# Patient Record
Sex: Female | Born: 1955 | Race: Black or African American | Hispanic: No | Marital: Single | Smoking: Current every day smoker
Health system: Western US, Academic
[De-identification: ages and names within clinical notes are randomized; demographics above are authoritative.]

## PROBLEM LIST (undated history)

## (undated) ENCOUNTER — Emergency Department (HOSPITAL_COMMUNITY): Admission: EM | Payer: Self-pay

## (undated) DIAGNOSIS — F1510 Other stimulant abuse, uncomplicated: Secondary | ICD-10-CM

## (undated) DIAGNOSIS — M130 Polyarthritis, unspecified: Secondary | ICD-10-CM

## (undated) DIAGNOSIS — Z9884 Bariatric surgery status: Secondary | ICD-10-CM

## (undated) DIAGNOSIS — E785 Hyperlipidemia, unspecified: Secondary | ICD-10-CM

## (undated) DIAGNOSIS — F209 Schizophrenia, unspecified: Secondary | ICD-10-CM

## (undated) DIAGNOSIS — F319 Bipolar disorder, unspecified: Secondary | ICD-10-CM

## (undated) DIAGNOSIS — I10 Essential (primary) hypertension: Secondary | ICD-10-CM

## (undated) DIAGNOSIS — I719 Aortic aneurysm of unspecified site, without rupture: Secondary | ICD-10-CM

## (undated) DIAGNOSIS — S72142A Displaced intertrochanteric fracture of left femur, initial encounter for closed fracture: Secondary | ICD-10-CM

## (undated) DIAGNOSIS — M797 Fibromyalgia: Secondary | ICD-10-CM

## (undated) DIAGNOSIS — E119 Type 2 diabetes mellitus without complications: Secondary | ICD-10-CM

## (undated) DIAGNOSIS — F1110 Opioid abuse, uncomplicated: Secondary | ICD-10-CM

## (undated) DIAGNOSIS — J449 Chronic obstructive pulmonary disease, unspecified: Secondary | ICD-10-CM

## (undated) DIAGNOSIS — E669 Obesity, unspecified: Secondary | ICD-10-CM

## (undated) DIAGNOSIS — Z90710 Acquired absence of both cervix and uterus: Secondary | ICD-10-CM

## (undated) HISTORY — PX: STOMACH SURGERY: SHX791

## (undated) HISTORY — PX: GASTRIC BYPASS: SHX52

## (undated) HISTORY — DX: Polyarthritis, unspecified: M13.0

## (undated) HISTORY — DX: Fibromyalgia: M79.7

## (undated) HISTORY — PX: OTHER PROCEDURE: U1053

## (undated) MED ORDER — LYRICA 200 MG OR CAPS
200.00 mg | ORAL_CAPSULE | Freq: Three times a day (TID) | ORAL | Status: AC
Start: 2019-10-18 — End: ?

## (undated) MED ORDER — LYRICA 200 MG OR CAPS
ORAL_CAPSULE | ORAL | 1 refills | Status: AC
Start: 2018-06-28 — End: ?

## (undated) MED ORDER — METHOCARBAMOL 750 MG OR TABS
750.00 mg | ORAL_TABLET | Freq: Four times a day (QID) | ORAL | 0 refills | Status: AC | PRN
Start: 2019-09-02 — End: 2019-10-02

## (undated) MED ORDER — CLINDAMYCIN HCL 300 MG OR CAPS
300.00 mg | ORAL_CAPSULE | Freq: Three times a day (TID) | ORAL | 0 refills | Status: AC
Start: 2019-10-19 — End: ?

## (undated) MED ORDER — LYRICA 200 MG OR CAPS
200.00 mg | ORAL_CAPSULE | Freq: Two times a day (BID) | ORAL | 1 refills | Status: AC
Start: 2019-10-17 — End: 2020-01-15

## (undated) MED ORDER — LYRICA 200 MG OR CAPS
ORAL_CAPSULE | ORAL | 1 refills | Status: AC
Start: 2018-03-31 — End: ?

---
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
## 2006-12-14 ENCOUNTER — Emergency Department (HOSPITAL_BASED_OUTPATIENT_CLINIC_OR_DEPARTMENT_OTHER): Admitting: Emergency Medicine

## 2006-12-15 ENCOUNTER — Other Ambulatory Visit (INDEPENDENT_AMBULATORY_CARE_PROVIDER_SITE_OTHER): Payer: Self-pay | Admitting: Emergency Medicine

## 2006-12-15 ENCOUNTER — Emergency Department: Payer: Self-pay

## 2006-12-16 LAB — ECG, COMPLETE (HC/~~LOC~~/ENCINITAS)
PR INTERVAL: 172 ms
QRS INTERVAL/DURATION: 94 ms
QT: 432 ms
QTc (Bazett): 428 ms
R AXIS: 36 degrees
VENTRICULAR RATE: 59 {beats}/min

## 2006-12-17 ENCOUNTER — Emergency Department: Payer: Self-pay

## 2006-12-17 ENCOUNTER — Other Ambulatory Visit (HOSPITAL_BASED_OUTPATIENT_CLINIC_OR_DEPARTMENT_OTHER): Payer: Self-pay | Admitting: Emergency Medicine

## 2006-12-17 LAB — URINALYSIS
Glucose: NEGATIVE
Ketones: NEGATIVE
Nitrite: NEGATIVE
Specific Gravity: 1.014 (ref 1.002–1.030)
pH: 6.5 (ref 5.0–8.0)

## 2006-12-18 ENCOUNTER — Inpatient Hospital Stay (HOSPITAL_COMMUNITY): Admitting: Surgery

## 2006-12-18 ENCOUNTER — Inpatient Hospital Stay (HOSPITAL_BASED_OUTPATIENT_CLINIC_OR_DEPARTMENT_OTHER): Payer: Self-pay | Admitting: Surgery

## 2006-12-18 ENCOUNTER — Other Ambulatory Visit (HOSPITAL_BASED_OUTPATIENT_CLINIC_OR_DEPARTMENT_OTHER): Payer: Self-pay | Admitting: Surgery

## 2006-12-22 NOTE — Progress Notes (Signed)
Dictating Practitioner: Fanny Bien, M.D.        Date of Note: 12/18/2006    The patient is afebrile. Vital signs are stable. Her abdominal pain is  minimal and chronic in nature. CT scan did not reveal any acute findings.  We will give her a diet and she will be discharged home today with follow  up with GI Medicine for chronic abdominal pain.        Signature Derived From Controlled Access Password  Fanny Bien, M.D. 12/22/2006 07:06 A      DD: 12/18/2006 DT: 12/21/2006 02:30 P DocNo.: [id number]  VB/lds    cc:

## 2007-01-06 ENCOUNTER — Emergency Department (HOSPITAL_BASED_OUTPATIENT_CLINIC_OR_DEPARTMENT_OTHER): Admitting: Emergency Medicine

## 2007-01-06 ENCOUNTER — Other Ambulatory Visit (INDEPENDENT_AMBULATORY_CARE_PROVIDER_SITE_OTHER): Payer: Self-pay | Admitting: Emergency Medicine

## 2007-01-06 ENCOUNTER — Emergency Department: Payer: Self-pay

## 2007-01-07 ENCOUNTER — Other Ambulatory Visit (HOSPITAL_BASED_OUTPATIENT_CLINIC_OR_DEPARTMENT_OTHER): Payer: Self-pay | Admitting: Emergency Medicine

## 2007-01-07 LAB — URINALYSIS
Glucose: NEGATIVE
Ketones: NEGATIVE
Nitrite: NEGATIVE
Specific Gravity: 1.021 (ref 1.002–1.030)
pH: 6 (ref 5.0–8.0)

## 2007-02-02 ENCOUNTER — Emergency Department: Payer: Self-pay

## 2007-02-03 ENCOUNTER — Other Ambulatory Visit (INDEPENDENT_AMBULATORY_CARE_PROVIDER_SITE_OTHER): Payer: Self-pay | Admitting: Emergency Medicine

## 2007-02-03 ENCOUNTER — Inpatient Hospital Stay (HOSPITAL_COMMUNITY): Admitting: Internal Medicine

## 2007-02-03 LAB — LIVER PANEL, BLOOD
ALT (SGPT): INVALID IU/L (ref 10–45)
ALT (SGPT): 52 IU/L — ABNORMAL HIGH (ref 10–45)
AST (SGOT): INVALID IU/L (ref 10–45)
AST (SGOT): 47 IU/L — ABNORMAL HIGH (ref 10–45)
Albumin: INVALID gm/dL (ref 3.3–5.0)
Albumin: 3.9 gm/dL (ref 3.3–5.0)
Alkaline Phos: INVALID IU/L (ref 30–130)
Alkaline Phos: 87 IU/L (ref 30–130)
Bilirubin, Dir: INVALID mg/dL (ref <0.2)
Bilirubin, Dir: 0.2 mg/dL (ref <0.2)
Bilirubin, Tot: INVALID mg/dL (ref <1.2)
Bilirubin, Tot: 0.3 mg/dL (ref <1.2)
Total Protein: INVALID gm/dL (ref 6.0–8.0)
Total Protein: 6.6 gm/dL (ref 6.0–8.0)

## 2007-02-03 LAB — CBC WITH DIFF, BLOOD
Basophils: 1 % (ref 0–2)
Eosinophils: 3 % (ref 1–3)
Hct: 39.5 % (ref 36.0–46.0)
Hgb: 13.2 gm/dL (ref 12.0–16.0)
Lymphocytes: 25 % (ref 20–40)
MCH: 29.2 pg (ref 27–31)
MCHC: 33.4 % (ref 32–37)
MCV: 87.4 um3 (ref 82.0–98.0)
MPV: 6.2 fl — ABNORMAL LOW (ref 7.4–10.4)
Monocytes: 5 % (ref 1–10)
Plt Count: 407 10*3/uL — ABNORMAL HIGH (ref 130–400)
RBC: 4.52 10*6/uL (ref 4.00–5.00)
RDW: 13.5 % (ref 10–15)
Segs: 65 % (ref 45–70)
WBC: 7.7 10*3/uL (ref 4.0–11.0)

## 2007-02-03 LAB — LACTATE, BLOOD: Lactate: 18.1 mg/dL (ref 6.5–19.3)

## 2007-02-03 LAB — URINALYSIS
Glucose: NEGATIVE
Ketones: NEGATIVE
Nitrite: NEGATIVE
Specific Gravity: 1.025 (ref 1.002–1.030)
Urobilinogen: 2 — AB (ref 0.2–?)
pH: 6.5 (ref 5.0–8.0)

## 2007-02-03 LAB — ADIF: Plt Est: HIGH

## 2007-02-03 LAB — BASIC METABOLIC PANEL, BLOOD
BUN: INVALID mg/dL (ref 8–18)
BUN: 13 mg/dL (ref 8–18)
Bicarbonate: INVALID mEq/L (ref 24–31)
Bicarbonate: 29 mEq/L (ref 24–31)
Calcium: INVALID mg/dL (ref 8.8–10.3)
Calcium: 9.5 mg/dL (ref 8.8–10.3)
Chloride: INVALID mEq/L (ref 97–107)
Chloride: 104 mEq/L (ref 97–107)
Creatinine: INVALID mg/dL (ref 0.5–1.5)
Creatinine: 0.7 mg/dL (ref 0.5–1.5)
GFR (African Amer.): >60 mL/min
GFR: >60 mL/min
Glucose: INVALID mg/dL (ref 65–110)
Glucose: 88 mg/dL (ref 65–110)
Potassium: INVALID meq/L (ref 3.5–5.0)
Potassium: 4.2 mEq/L (ref 3.5–5.0)
Sodium: INVALID mEq/L (ref 135–145)
Sodium: 141 mEq/L (ref 135–145)

## 2007-02-03 LAB — LIPASE, BLOOD
Lipase: INVALID U/L (ref 22–51)
Lipase: 24 U/L (ref 22–51)

## 2007-02-03 LAB — HEMOLYSIS

## 2007-02-04 ENCOUNTER — Other Ambulatory Visit (INDEPENDENT_AMBULATORY_CARE_PROVIDER_SITE_OTHER): Payer: Self-pay | Admitting: Diagnostic Radiology

## 2007-02-04 NOTE — Progress Notes (Signed)
Dictating Practitioner: Lise Auer. Aadit Hagood, D.O.        Date of Note: 02/04/2007    Inpatient Progress Note        S: Patient seen and examined independantly 2 xs throughout the day. Case  discussed with resident. Please see the HO note for further details.  Several observers, including myself, noted patient resting comfortably when  being observed from afar. I saw her during a procedure to place a  difficulty IV line. She was talking also  with one of the financial personnel at the same time. She was sitting at  90degrees and comfortable, filling out paperwork and talking with staff  members iwthout complaints (she had just gotten  IV phenergan). Earler in the day, I had a 20 minute discussion with her  regarding our plans for pain medication. She has refused to eat anything  or drink water, but has been tolerating sips of water.  There has been no documented vomiting. Per the patient's history, she had  a hamburger and fries the day after the abdominal pain began without  worsening of the pain or vomiting.  I finally convinced the patient to take water and some PO medications and  we are pending CT abdomen recommended by GI.    She had a small bm today. several nurses and my housestaff have noted  patient sleeping comfortably prior to their walking in the room.  Our plan is as follows: place a fentanyl TDS and continue the dilauded PO  with prn IV dilauded (must try the PO first). change the phenergan to PO  or PR. pt agreed to  take water and will advance her diet as tolerated. await CT results but  doubt that acute process will be uncovered that hasn't been found on her  other 4 CTs done this year.      O: T = 98.8 HR = 76 BP = 109/54 RR = 18 Pulse  Ox =  Gen: alert an oriented x 3 with subjective abdominal pain out of  proportion to exam  Heent  Chest clear  Heart rrr no m  Abd soft, voluntary guarding. Exam limited due to guarding. + bs no mass  neg murphy's  Ext no cce      Labs:  revieewed in PCIS  CT abdomen  P      Assessment Plan  acute on chronic abdominal pain s/p multiple surgeries. No obvious  mechanical cause of the pain. Patinet will need long-term chronic pain  managment and we are attempting to  address this with the fentanly and dilauded.    "obstipation" had small bm today. continue colace, fiber and  suppositories as needed: pt has refused the POs    suspected drug seeking behaviors : attempting to d/c IV meds in favor of  long-acting Po choices.    n/v none documented since admission - pt encouraged to advance to liquids  and agreed        Signature Derived From Controlled Access [employer] L. Caretha Rumbaugh, D.O. 02/04/2007 10:49 P      DD: 02/04/2007 TD: 10:36 P DT: 02/04/2007 10:36 P DocNo.: [id number]  JPP/    cc:

## 2007-02-05 NOTE — Progress Notes (Signed)
Dictating Practitioner: Lise Auer. Alexandria Proctor, D.O.        Date of Note: 02/05/2007    Inpatient Progress Note 2 visits total time spent 30 minutes with > 50%  counseling    S: Patient seen and examined independently, Case reviewed with house  staff. Please see HO note for further details.  pt was able to tolerate sips of water. GI planned an EGD today, but could   not sedate the  patient. Pt was off the floor for > 1 hour prior to EGD . She came back   in time for the  EGd to be done. OF note, nursing staff saw her in the cafeteria trying to   get food, even  though she was NPO for the procedure and had been refusing to try even   clear liquids for  [place].  I saw her first while she was speaking to one of the ancillary staff and   she didn't know I  was there. she was sitting up in bed comfortably in no distress.   Everytime a member of  our team goes in, however, she is writhing in pain. secondary gain is   suspected highly  and will not push GI to sedate her for an EGD under general anesthesia.  O: T = 97.8 HR = 132-113 BP = 129/79 RR = 20-28 Pulse Ox =   2 L NC   98%    (after      procedure      attempt)    Gen: A&O x 3 nad  Heent:  Chest: clear  Heart: rrr no m  Abd: unchanged: subjective pain out of proportion to physical exam  Ext:no  Labs: reviewed in PCIS; pertinent ones below  no new labs    Assessment:  abdominal pain of unclear etiology but likely to be chronic. Pain service   will be  consulted as soon as we have documentation that she is eating, though we   have no  documented vomiting episodes since arrival. I spoke with her again about   the importance  of eating - ANYTHING since the above makes it clear that she is hungry. I   have d/c'd all  IV meds and will gladly give her any PO narcotic she can tolerate. right   now dilauded,  then transition her to long-acting MS contin or the equivelent. Once we   have her on a  regimen we can continue as an outpatient and her pain is reasonably   controlled, will  d/c  her. She will not get any IV meds unless we have documentation that she is   vomiting,  however.      Plan:                    Signature Derived From Controlled [employer] L. Jannie Doyle, D.O. 02/05/2007 04:00 P      DD: 02/05/2007 TD: 03:50 P DT: 02/05/2007 03:50 P DocNo.: [id number]  JPP/    cc:

## 2007-02-06 NOTE — Progress Notes (Signed)
Dictating Practitioner: Lise Auer. Tyaire Odem, D.O.        Date of Note: 02/06/2007    Inpatient Progress Note        S: Patient seen and examined with resident. case discussed and plan  formulated with patients' consent by me. Please see resident note for  further details  Pt had several adjustments to her PO pain regimen overnight. Per the  staff, pt was seen taking food off some of the patient trays, but claims  she has not eaten  anything. she told my medical student that she threw up, but that has not  been documented again, since arrival. She states that the pain is a bit  better with the  MS contin, "maybe 10%" We agreed to double the dose of MS contin and that  if that doesn' work, the team may go up to 45 mg po tid beginning this  evening but  that it should stay there for at least 24 hours to assure we don't OD her.  She agreed. she told me she had a small bm this am.   I also discussed with her the Pain clinic option as an outpatient and  expressed to her the need to get an outpatient PCP so that they can refer  her to this clinic and continue  her pain regimen beyond the few weeks we will be able to give her. she  verbalized that she would.    O: T = 97.8 HR = 82 BP = 100/64 RR = 18 Pulse  Ox =  Gen: alert and oreinted x 3 nad: resting in bed complaining of "severe  abdomnial pain  Heent  Chest clear  Heart rrr no m  Abd unchanged: pain out of proportion to exam + bs, no peritoneal signs  Ext no cce  Labs:    no new labs to note    Assessment Plan  chronic pain with ? acute exacerbation - no evidence of pancreatitis,  ischemic colitis, sbo or othe intraabdominal process that is acute. EGD  was unsuccessful - see the GI procedure note for details  we have her taking only orals and she is complaining, but eventually  cooperative. I have some evidence that she is eating, though she denies  it. She is definately agreeeing that she is begining to have BMS  we agreed to give her 24 hours more to adjust her meds  and assure she will  not od and we will send her out tomorrow with a combo of MS contin and  Dilauded PO with a bowel regimen for a 2-3 week period only.          Signature Derived From Controlled [employer] L. Jaleyah Longhi, D.O. 02/06/2007 05:38 P      DD: 02/06/2007 TD: 05:28 P DT: 02/06/2007 05:28 P DocNo.: [id number]  JPP/    cc:

## 2007-03-18 ENCOUNTER — Other Ambulatory Visit (INDEPENDENT_AMBULATORY_CARE_PROVIDER_SITE_OTHER): Payer: Self-pay | Admitting: Emergency Medicine

## 2007-03-18 ENCOUNTER — Emergency Department (HOSPITAL_BASED_OUTPATIENT_CLINIC_OR_DEPARTMENT_OTHER): Admitting: Emergency Medicine

## 2007-03-18 ENCOUNTER — Emergency Department: Payer: Self-pay

## 2007-03-18 LAB — CBC WITH DIFF, BLOOD
Basophils: 1 % (ref 0–2)
Eosinophils: 5 % — ABNORMAL HIGH (ref 1–3)
Hct: 34.9 % — ABNORMAL LOW (ref 36.0–46.0)
Hgb: 11.7 gm/dL — ABNORMAL LOW (ref 12.0–16.0)
Lymphocytes: 24 % (ref 20–40)
MCH: 27.8 pg (ref 27–31)
MCHC: 33.5 % (ref 32–37)
MCV: 82.8 um3 (ref 82.0–98.0)
MPV: 7 fl — ABNORMAL LOW (ref 7.4–10.4)
Monocytes: 11 % — ABNORMAL HIGH (ref 1–10)
Plt Count: 348 10*3/uL (ref 130–400)
RBC: 4.21 10*6/uL (ref 4.00–5.00)
RDW: 15.2 % — ABNORMAL HIGH (ref 10–15)
Segs: 59 % (ref 45–70)
WBC: 7.3 10*3/uL (ref 4.0–11.0)

## 2007-03-18 LAB — URINALYSIS
Bilirubin: NEGATIVE
Blood: NEGATIVE
Glucose: NEGATIVE
Ketones: NEGATIVE
Nitrite: NEGATIVE
Specific Gravity: 1.025 (ref 1.002–1.030)
pH: 5.5 (ref 5.0–8.0)

## 2007-03-18 LAB — LIVER PANEL, BLOOD
ALT (SGPT): 29 IU/L (ref 10–45)
AST (SGOT): 27 IU/L (ref 10–45)
Albumin: 3.3 gm/dL (ref 3.3–5.0)
Alkaline Phos: 68 IU/L (ref 30–130)
Bilirubin, Dir: <0.1 mg/dL (ref <0.2)
Bilirubin, Tot: 0.4 mg/dL (ref <1.2)
Total Protein: 6.1 gm/dL (ref 6.0–8.0)

## 2007-03-18 LAB — BASIC METABOLIC PANEL, BLOOD
BUN: 14 mg/dL (ref 8–18)
Bicarbonate: 28 mEq/L (ref 24–31)
Calcium: 8.8 mg/dL (ref 8.8–10.3)
Chloride: 105 mEq/L (ref 97–107)
Creatinine: 0.7 mg/dL (ref 0.5–1.5)
GFR (African Amer.): >60 mL/min
GFR: >60 mL/min
Glucose: 58 mg/dL — ABNORMAL LOW (ref 65–110)
Potassium: 3.5 mEq/L (ref 3.5–5.0)
Sodium: 145 mEq/L (ref 135–145)

## 2007-03-18 LAB — LIPASE, BLOOD: Lipase: 20 U/L — ABNORMAL LOW (ref 22–51)

## 2010-07-12 NOTE — Progress Notes (Unsigned)
Dictating Practitioner: Fanny Bien, M.D.        Date of Note: 12/18/2006    The patient is afebrile. Vital signs are stable. Her abdominal pain is  minimal and chronic in nature. CT scan did not reveal any acute findings.  We will give her a diet and she will be discharged home today with follow  up with GI Medicine for chronic abdominal pain.          Unreviewed      DD: 12/18/2006 DT: 12/21/2006 02:30 P DocNo.: [id number]  VB/lds    cc:

## 2010-07-12 NOTE — Procedures (Signed)
Report Author: Graylin Shiver. Orson Slick, M.D.    Date of Operation: 02/05/2007    Endoscopist Dakarai Mcglocklin S Nora Sabey  GI Fellow None  Referring Physician    PROCEDURE PERFORMED  ESOPHAGOSCOPY    INDICATIONS FOR EXAMINATION  54 year old with abdominal complaints. Negative CT  scan of the abdomen.    INSTRUMENTS: 053  MEDICATIONS: Demerol 50 mg IVP, Versed 4  mg IVP, Phenergan 25 mg IV    The attending physician, Dr. Orson Slick, was  present for the entire examination.    PROCEDURE TECHNIQUE : A physical exam was  performed. Informed consent was obtained from the  patient after explaining all the risks (perforation,  bleeding, infection and adverse effects to the  medicine) , benefits and alternatives to the  procedure which the patient appeared to understand  and so stated. The patient was connected to the  monitoring devices and placed in the left lateral  position. Continuous oxygen was provided with a  nasal cannula and IV medicine administered  through a indwelling cannula. After adequate  conscious sedation was achieved, the patient was  intubated and the scope advanced under direct  visualization to the upper esophagus. The scope was  subsequently removed slowly while carefully  examining the color, texture, anatomy, and integrity  of the mucosa on the way out. The patient was  subsequently transferred to the recovery area in  satisfactory condition.    ESTIMATED BLOOD LOSS: None  . SPECIMEN: No Biopsies Taken      FINDINGS  Normal appearing upper esophagus.  Patient became combative and was swinging her  arms and throwing herself around on the table  despite medications. The procedure was cancelled  at this point.    ENDOSCOPIC DIAGNOSIS  Abdominal pain  Incomplete upper endoscopy    RECOMMENDATIONS  This examination cannot exclude ulcer disease or  problems related to gastric anatomy. An outpatient  endoscopy should be considered if the patient's pain  persists but general anesthesia is recommended for  sedating the  patient.              Signature Derived From Controlled Access Password  Graylin Shiver. Orson Slick, M.D. 02/05/2007 10:24 P          DD: 02/05/2007 DT: 02/05/2007 12:30 P DocNo.: [id number]  GSH/bsm    Referring Physician:  EMERGENCY DEPARTMENT        Primary Care Physician:  NO PCP PER PATIENT        cc:

## 2012-04-21 ENCOUNTER — Encounter (HOSPITAL_COMMUNITY): Payer: Self-pay

## 2012-04-21 ENCOUNTER — Other Ambulatory Visit (HOSPITAL_BASED_OUTPATIENT_CLINIC_OR_DEPARTMENT_OTHER): Payer: Self-pay | Admitting: Emergency Medicine

## 2012-04-21 LAB — URINALYSIS
Nitrite: NEGATIVE
Urobilinogen: 3 — AB (ref 0.2–1)
pH: 6.5 (ref 5.0–8.0)

## 2012-04-21 LAB — METERED HGB (POCT): Hgb (POCT) (Metered): 13.8 gm/dL (ref 11.2–15.7)

## 2012-04-21 MED ORDER — HYDROMORPHONE HCL 1 MG/ML IJ SOLN
0.50 mg | Freq: Once | INTRAMUSCULAR | Status: AC
Start: 2012-04-21 — End: 2012-04-21
  Administered 2012-04-21: 0.5 mg via INTRAVENOUS
  Filled 2012-04-21: qty 1

## 2012-04-21 MED ORDER — HYDROMORPHONE HCL 1 MG/ML IJ SOLN
1.0000 mg | Freq: Once | INTRAMUSCULAR | Status: DC
Start: 2012-04-21 — End: 2012-04-21

## 2012-04-21 MED ORDER — PROMETHAZINE HCL 25 MG/ML IJ SOLN
25.00 mg | Freq: Once | INTRAMUSCULAR | Status: AC
Start: 2012-04-22 — End: 2012-04-22
  Administered 2012-04-22: 25 mg via INTRAVENOUS
  Filled 2012-04-21: qty 1

## 2012-04-21 MED ORDER — HYDROMORPHONE HCL 1 MG/ML IJ SOLN
0.50 mg | Freq: Once | INTRAMUSCULAR | Status: AC
Start: 2012-04-22 — End: 2012-04-21
  Administered 2012-04-21: 0.5 mg via INTRAVENOUS
  Filled 2012-04-21: qty 1

## 2012-04-21 MED ORDER — HYDROMORPHONE HCL 1 MG/ML IJ SOLN
1.00 mg | Freq: Once | INTRAMUSCULAR | Status: AC
Start: 2012-04-21 — End: 2012-04-21
  Administered 2012-04-21: 1 mg via INTRAMUSCULAR
  Filled 2012-04-21: qty 1

## 2012-04-21 MED ORDER — SODIUM CHLORIDE 0.9 % IV SOLN
Freq: Once | INTRAVENOUS | Status: AC
Start: 2012-04-21 — End: 2012-04-21

## 2012-04-21 MED ORDER — PRILOSEC PO
ORAL | Status: DC
Start: ? — End: 2012-04-26

## 2012-04-21 MED ORDER — PROMETHAZINE HCL 25 MG/ML IJ SOLN
25.00 mg | Freq: Once | INTRAMUSCULAR | Status: AC
Start: 2012-04-21 — End: 2012-04-21
  Administered 2012-04-21: 25 mg via INTRAMUSCULAR
  Filled 2012-04-21: qty 1

## 2012-04-21 NOTE — ED Notes (Signed)
Unable to find site for iv. md to bedside for eval

## 2012-04-21 NOTE — ED Notes (Signed)
Central line accessed for repeat labs. Medicated per orders. States pain continues 10/10, infusion ns initiated. Notified pt of poc - pending ct.

## 2012-04-21 NOTE — ED Notes (Signed)
md tomas to bedside to evaluate line placement for labs/meds/fluids.

## 2012-04-21 NOTE — ED Notes (Signed)
Po water to pt per ct tech , md to bedside for re-eval. Pt requesting add pain/nausea meds from md. Vs updated.

## 2012-04-21 NOTE — ED Notes (Signed)
2032-2110 iv attempt x4 to right neck, right foot, left foot, left axilla, unable to obtain labs/iv access. md notified.

## 2012-04-21 NOTE — ED Attending Note (Signed)
ED ATTENDING NOTE:    This iis a 56 year old female s/p gastric bypass 10 yrs ago now comes in with 5 days of severe constant dull epigastric pain radiating to umbilicus associate with persistent vomiting nb nb.  Also dark tarry stools. Some fever, no dysuria, no flank pain, no vag dc no vag bleeding.   GEN: alert, awake and oriented in moderate distress.  BP 136/95  Pulse 93  Temp(Src) 98.8 F (37.1 C)  Resp 18  Ht 5\' 6"  (1.676 m)  Wt 83.008 kg (183 lb)  BMI 29.55 kg/m2  SpO2 99%   VSS with nl oxygen sat on room air  HEENT: NCAT, eomi, perrl, no icterus, mucosa moist, pharynx clear  NECK: supple; JVD No  LUNGS: clear to auscultation  COR: regular rate and rhythm  ABD: abdomen with old midline scar, +BS, mild distension, diffuse tenderness epig and periumbilical without rebound or gaurding  BACK: Back symmetric, no curvature. ROM normal. No CVA tenderness.  LE: no edema  SKIN: Skin color, texture, turgor normal. No rashes or lesions.  NEURO: alert Ox3, CN 3-12 grossly intact, good strength and sensation bilat, nl gait,     Labs Reviewed   URINALYSIS - Abnormal; Notable for the following:     Protein Trace (*)     Urobilinogen 3 (*)     Leuk Esterase Trace (*)     Mucus Few (*)     All other components within normal limits    Narrative:     no urine cup.   CBC WITH ADIFF, BLOOD - Abnormal; Notable for the following:     RDW 16.0 (*)     All other components within normal limits   COMPREHENSIVE METABOLIC PANEL, BLOOD   LIPASE, BLOOD   PROTHROMBIN TIME, BLOOD   APTT, BLOOD   TYPE & SCREEN       IMP: epigastric pain and vomiting in gastric bypass patient    CDM: doubt full obstruction, could be pancreatitis, UTI,    PLAN:will give fluids, pain meds, consult silver surgery, possible CT abd, Check CXR for perforation.

## 2012-04-21 NOTE — ED Provider Notes (Addendum)
Emergency Dept Provider Note    Chief Complaint:   Chief Complaint   Patient presents with   . Abdominal Pain     constant mid abd pain x 5 days, increasing inseverity. reports black tarry stools(no blood) x 3 days.  reports nausea, vomit(no blood), chills, Temp of 102 yest.  denies dysuria/hematuria.  in triage FSHGB=13.8   note to nursing: pt states is a hard IV start, states gets IV's in neck       HPI:  Alexandria Proctor is a 56 year old  female with h/o gastric bypass surgery as well as 4 additional surgeries for ventral hernia repair since who presents with 5 days of periumbilical abdominal pain, 3 days of N/V, decreased PO intake and dark tarry stools. The pain is at point and of stabbing quality, 10/10, nonradiating, nothing makes it better, and the pt has never experienced this type of pain before. She has been taking Prilosec and Tylenol without symptoms relief. She denies diarrhea, constipation, F/C (though pt reports a temp of 102F yesterday), CP, SOB, weakness, numbness, tingling.    Pt is new to the area. All her procedures were completed in [place]. She just moved here to live with her daughter in [place]. She has not established health care in the area.    Patient's Medications   New Prescriptions    No medications on file   Previous Medications    OMEPRAZOLE (PRILOSEC PO)       Modified Medications    No medications on file   Discontinued Medications    No medications on file       Allergies: Compazine; Reglan; Toradol; Tramadol; and Zofran    Past Medical History:   Obesity    Past Surgical History:   Past Surgical History   Procedure Laterality Date   . Gastric bypass     . Ventral hernia repair         Family History:   Denies relevant family history.    History     Social History   . Marital Status: Single     Spouse Name: N/A     Number of Children: N/A   . Years of Education: N/A     Occupational History   . unemployed      Social History Main Topics   . Smoking status: Never Smoker    . Smokeless  tobacco: Never Used   . Alcohol Use: No   . Drug Use: No   . Sexually Active: Not Currently     Other Topics Concern   . Not on file     Social History Narrative   . No narrative on file         ROS:  As per HPI, unless noted below  General: Reports fever, denies rigors; No weight change, fatigue, weakness, night sweats.  Skin: No itching, rashes, sores.  HEENT: No headache, visual changes, blurring dizziness, sore throat.  CV: No chest pain, palpitations.  Resp: No SOB, cough, sputum, hemoptysis.  GI:  Decreased appetite, + NV, + abdominal pain; No diarrhea, constipation, bleeding.  GU: No frequency, hesitancy, urgency, polyuria, dysuria, hematuria.  Ext: No weakness, muscle pain, joint stiffness, swelling.  Neuro: No numbness, tingling, problems thinking.  Psych: No hx of depression, anxiety.    Physical exam  Vital signs reviewed and noted -   Filed Vitals:    04/21/12 1927   BP: 136/95   Pulse: 93   Temp: 98.8 F (37.1 C)  Resp: 18   SpO2: 99%        Gen: Patient is in NAD, A&O, behaving appropriately, non-toxic appearing  HEENT: NC/AT, PERRL. No icterus, ptosis. Normal oropharynx w/out exudates, erythema. Moist mucous membranes.  Neck: Supple, no JVD, no LAD.  Lungs: Normal breath sounds. No wheeze/rales/rhonchi   CV: RRR. Normal heart sounds. No murmurs appreciated. Pulses equal b/l.  Abdomen: Normal bowel sounds. NTND. No masses, organomegaly.  Heme occult negative  Back: No CVA tenderness.  Extremities: No cyanosis, edema.   Neurologic: Mentation appropriate. Gait normal. CN II-XII grossly normal.    LABS  Results for orders placed during the hospital encounter of 04/21/12   URINALYSIS       Result Value Range    Type Not Specified      Color Yellow  Yellow    Appearance Clear  Clear    Specific Gravity 1.018  1.002 - 1.030    pH 6.5  5.0 - 8.0    Protein Trace (*) Negative    Glucose Negative  Negative    Ketones Negative  Negative    Bilirubin Negative  Negative    Blood Negative  Negative     Urobilinogen 3 (*) 0.2-1 EU/dL    Nitrite Negative  Negative    Leuk Esterase Trace (*) Negative    WBC 0-2  0-2/HPF    RBC 0-2  0-2/HPF    Bacteria Rare  None-Rare/HPF    Squam. Epithelial Cell <1  0-Few/HPF    Mucus Few (*) None-Rare/HPF   CBC WITH ADIFF, BLOOD       Result Value Range    WBC 6.8  4.0 - 10.0 1000/mm3    RBC 4.86  3.90 - 5.20 mill/mm3    Hgb 13.7  11.2 - 15.7 gm/dL    Hct 16.1  09.6 - 04.5 %    MCV 81.9  79.0 - 95.0 um3    MCH 28.2  26.0 - 32.0 pgm    MCHC 34.4  32.0 - 36.0 %    RDW 16.0 (*) 12.0 - 14.0 %    MPV 9.8  9.4 - 12.4 fL    Plt Count 351  140 - 370 1000/mm3    Instrument ANC 4.7  1.6 - 7.0 1000/mm3    Segs 69  34 - 71 %    Lymphocytes 19  19 - 53 %    Monocytes 8  5 - 12 %    Eosinophils 3  1 - 7 %    Absolute Neutrophil Count 4.7  1.6 - 7.0 K/uL    Abs Lymphs 1.3  0.8 - 3.1 1000/mm3    Abs Monos 0.6  0.2 - 0.8 1000/mm3    Abs Eosinophils 0.2  0.0 - 0.5 1000/mm3    Diff Type Automated     COMPREHENSIVE METABOLIC PANEL, BLOOD       Result Value Range    Glucose 86  70 - 115 mg/dL    BUN 11  6 - 20 mg/dL    Creatinine 4.09  8.11 - 0.95 mg/dL    GFR >91      Sodium 140  136 - 145 mmol/L    Potassium 4.3  3.5 - 5.1 mmol/L    Chloride 105  98 - 107 mmol/L    Bicarbonate 25  22 - 29 mmol/L    Calcium 9.3  8.6 - 10.5 mg/dL    Total Protein 6.9  6.0 - 8.0 g/dL    Albumin  4.0  3.5 - 5.2 g/dL    Bilirubin, Tot 0.2  <1.2 mg/dL    AST (SGOT) 27  0 - 32 U/L    ALT (SGPT) 19  0 - 33 U/L    Alkaline Phos 77  35 - 140 U/L   LIPASE, BLOOD       Result Value Range    Lipase 24  13 - 60 U/L       DIAGNOSTIC STUDIES  ECG (12 lead; ordered and interpreted by me): NSR, rate 92, normal axis and intervals. QTc<592ms. No hypertrophy, ST, Tw, or Qw abnormalities.     CXR: No acute Cardiopulmonary Dz  Right sided transjugular central venous catheter with tip in the upper/mid superior vena cava.  Well-expanded clear lungs.  No pleural effusion or pneumothorax.  Unremarkable cardiomediastinal silhouette aside from  aortic arch calcifications.   No acute osseous abnormality.  Suture material in the left upper abdominal quadrant.      Assessment and Clinical Decision-making  56 yo F with extensive abdominal surgical history presents with abd pain x5 days of unclear etiology.   Pt's report of melena is concerning for upper GI bleed, but H&H normal range and pt heme occult negative. No history of vascular disease, family h/o AAA, or pulsatile mass on exam - pulses equal bilaterally. Pt still stooling and passing gas - obstruction doubtful. Pt has been afebrile since arriving. No evidence of infection on labs. Will attain CT abd w/ contrast to eval for possible appendicitis, cholecystitis, AAA, abscess, and other possible intraabdominal pathology.    ED Course:  - Pt arrived in significant pain - after failing to gain vascular access, dilaudid and phenergan were given IM for pain mgmt and antiemesis.  - Access attained with US guided Right IJ central line. Placement confirmed with CXR.  - CXR and EKG unremarkable  - Labs and UA thus far unremarkable  - CT abdomen w/ Contrast Pending      Patient discussed with attending, Tomaszewski, Christian A*, before final recommendations are made.     Sharlene Dory, MD  Resident  04/21/12 2328    Pt reassessed. Still complaining of severe abdominal pain.  Vitals stable with BP elevated, tachycardia resolved  Will re-dose Phenergan and dilaudid to mgmt symptoms  CT still pending.    Sharlene Dory, MD  Resident  04/22/12 [phone]

## 2012-04-21 NOTE — ED Notes (Signed)
Urine to lab, extra in fridge  Pt in w.r

## 2012-04-21 NOTE — ED EKG Interpretation (Signed)
ED EKG Interpretation    EKG: normal EKG, normal sinus rhythm, Rate at 92, axis at -3 deg, normal intervals, no st t wave deviation.

## 2012-04-21 NOTE — ED Notes (Signed)
Pt c/o past 5days worsening abdominal pain with past 3-4 days of "black sticky stinky stools". Pt denies bleeding/bloody emesis/prior gi bleed, denies d/f/c/sob/cp, n/v multiple episodes - food only. Aaox3, resting to right side on gurney, positioned for comfort. ekg done in triage, awaiting md eval. Calm/cooperative, flat affect.

## 2012-04-21 NOTE — ED Notes (Signed)
EKG given to Dr. Rowe Clack, no prior from MUSE, copy placed in chart

## 2012-04-21 NOTE — ED Procedure Note (Signed)
Procedure Note  Central Line  Date/Time: 04/21/2012 11:40 PM  Performed by: Janeann Merl Ayanni Tun MICHAEL  Authorized by: Donovan Kail  Consent: Verbal consent obtained. Written consent not obtained.  Risks and benefits: risks, benefits and alternatives were discussed  Consent given by: patient  Patient understanding: patient states understanding of the procedure being performed  Patient consent: the patient's understanding of the procedure matches consent given  Procedure consent: procedure consent matches procedure scheduled  Relevant documents: relevant documents present and verified  Site marked: the operative site was marked  Imaging studies: imaging studies available  Patient identity confirmed: verbally with patient and arm band  Time out: Immediately prior to procedure a "time out" was called to verify the correct patient, procedure, equipment, support staff and site/side marked as required.  Indications: vascular access  Anesthesia: local infiltration  Local anesthetic: lidocaine 1% without epinephrine  Anesthetic total: 5 ml  Patient sedated: no  Pre-placement Checklist  Hand hygiene by all in room/procedure room: yes  CHG applicator used: no30 second scrub and 2 minute dry: yes  Operator wore: sterile gloves, mask with eye shield, sterile gown and hat  Maximum sterile barrier precautions: yes  Sterile field maintained during procedure: Sterile field maintained during procedure  Sterile technique maintained while applying dressing: Yes  CHG impregnated site patch placed: No  Dressing Date/Time: 04/21/2012 11:15 PM  Ointment applied: noPre-procedure: landmarks identified  Preparation: skin prepped with chlorhexidine and sterile field established  Insertion side: right   Catheter tip location: Internal jugular vein  Patient position: Trendelenburg  Catheter type: triple lumen  Ultrasound guidance: yes  Ultrasound guide placement: needle entry  Indications for ultrasound: safety    Reason for  insertion: new indication  Number of attempts: 1  Successful placement: yes  Estimated Blood Loss: 3 ml  Attending was not presentPost-procedure: line sutured and dressing applied  Assessment: free fluid flow and placement verified by x-ray  Complications: none  Follow-up: xray done line in, place ok to use and flush protocol by guideline    Patient tolerance: Patient tolerated the procedure well with no immediate complications.

## 2012-04-22 ENCOUNTER — Inpatient Hospital Stay
Admission: EM | Admit: 2012-04-22 | Discharge: 2012-04-26 | Disposition: A | Discharge status: Home Health | Attending: Surgery | Admitting: Surgery

## 2012-04-22 HISTORY — DX: Obesity, unspecified: E66.9

## 2012-04-22 LAB — CBC WITH DIFF, BLOOD
ANC-Automated: 3.6 10*3/uL (ref 1.6–7.0)
ANC-Instrument: 3.6 10*3/uL (ref 1.6–7.0)
Abs Eosinophils: 0.2 10*3/uL (ref 0.0–0.5)
Abs Lymphs: 1.4 10*3/uL (ref 0.8–3.1)
Abs Monos: 0.5 10*3/uL (ref 0.2–0.8)
Eosinophils: 4 % (ref 1–7)
Hct: 34.1 % (ref 34.0–45.0)
Hgb: 11.3 gm/dL (ref 11.2–15.7)
Lymphocytes: 25 % (ref 19–53)
MCH: 27.8 pg (ref 26.0–32.0)
MCHC: 33.1 % (ref 32.0–36.0)
MCV: 83.8 um3 (ref 79.0–95.0)
MPV: 9.1 fL — ABNORMAL LOW (ref 9.4–12.4)
Monocytes: 9 % (ref 5–12)
Plt Count: 299 10*3/uL (ref 140–370)
RBC: 4.07 10*6/uL (ref 3.90–5.20)
RDW: 16.2 % — ABNORMAL HIGH (ref 12.0–14.0)
Segs: 62 % (ref 34–71)
WBC: 5.8 10*3/uL (ref 4.0–10.0)

## 2012-04-22 LAB — ECG, COMPLETE (HC/~~LOC~~/ENCINITAS)
QRS INTERVAL/DURATION: 86 ms
VENTRICULAR RATE: 92 {beats}/min

## 2012-04-22 LAB — LACTATE, BLOOD: Lactate: 6.3 mg/dL (ref 4.5–19.8)

## 2012-04-22 MED ORDER — ACETAMINOPHEN 10 MG/ML IV SOLN
1000.00 mg | Freq: Four times a day (QID) | INTRAVENOUS | Status: AC | PRN
Start: 2012-04-22 — End: 2012-04-24
  Administered 2012-04-23 (×3): 1000 mg via INTRAVENOUS
  Filled 2012-04-22 (×4): qty 100

## 2012-04-22 MED ORDER — HYDROMORPHONE HCL 1 MG/ML IJ SOLN
0.50 mg | Freq: Once | INTRAMUSCULAR | Status: AC
Start: 2012-04-22 — End: 2012-04-22
  Administered 2012-04-22: 0.5 mg via INTRAVENOUS
  Filled 2012-04-22: qty 1

## 2012-04-22 MED ORDER — SODIUM CHLORIDE 0.9 % IV SOLN
INTRAVENOUS | Status: DC
Start: 2012-04-22 — End: 2012-04-25

## 2012-04-22 MED ORDER — ACETAMINOPHEN 325 MG PO TABS
650.0000 mg | ORAL_TABLET | ORAL | Status: DC | PRN
Start: 2012-04-22 — End: 2012-04-26
  Administered 2012-04-24 – 2012-04-25 (×2): 650 mg via ORAL
  Filled 2012-04-22 (×3): qty 2

## 2012-04-22 MED ORDER — HYDROMORPHONE HCL 1 MG/ML IJ SOLN
0.5000 mg | INTRAMUSCULAR | Status: DC | PRN
Start: 2012-04-22 — End: 2012-04-23
  Filled 2012-04-22: qty 1

## 2012-04-22 MED ORDER — HYDROMORPHONE HCL 1 MG/ML IJ SOLN
1.0000 mg | INTRAMUSCULAR | Status: DC | PRN
Start: 2012-04-22 — End: 2012-04-22
  Administered 2012-04-22: 1 mg via INTRAVENOUS
  Filled 2012-04-22: qty 1

## 2012-04-22 MED ORDER — HYDROMORPHONE HCL 1 MG/ML IJ SOLN
1.00 mg | Freq: Once | INTRAMUSCULAR | Status: AC
Start: 2012-04-22 — End: 2012-04-22
  Administered 2012-04-22: 1 mg via INTRAVENOUS
  Filled 2012-04-22: qty 1

## 2012-04-22 MED ORDER — HYDROMORPHONE HCL 1 MG/ML IJ SOLN
1.0000 mg | INTRAMUSCULAR | Status: DC | PRN
Start: 2012-04-22 — End: 2012-04-23
  Administered 2012-04-22 – 2012-04-23 (×16): 1 mg via INTRAVENOUS
  Filled 2012-04-22 (×17): qty 1

## 2012-04-22 MED ORDER — LANSOPRAZOLE 30 MG OR CPDR
30.0000 mg | DELAYED_RELEASE_CAPSULE | Freq: Two times a day (BID) | ORAL | Status: DC
Start: 2012-04-22 — End: 2012-04-26
  Administered 2012-04-22 – 2012-04-26 (×9): 30 mg via ORAL
  Filled 2012-04-22 (×10): qty 1

## 2012-04-22 MED ORDER — POTASSIUM CHLORIDE 20 MEQ/50ML IV SOLN
20.00 meq | INTRAVENOUS | Status: AC
Start: 2012-04-22 — End: 2012-04-23
  Administered 2012-04-22 – 2012-04-23 (×2): 20 meq via INTRAVENOUS
  Filled 2012-04-22 (×2): qty 50

## 2012-04-22 MED ORDER — SODIUM CHLORIDE 0.9 % IJ SOLN (CUSTOM)
3.0000 mL | INTRAMUSCULAR | Status: DC | PRN
Start: 2012-04-22 — End: 2012-04-26

## 2012-04-22 MED ORDER — DOCUSATE SODIUM 250 MG OR CAPS
250.0000 mg | ORAL_CAPSULE | Freq: Every evening | ORAL | Status: DC
Start: 2012-04-22 — End: 2012-04-26
  Administered 2012-04-22 – 2012-04-24 (×4): 250 mg via ORAL
  Filled 2012-04-22 (×5): qty 1

## 2012-04-22 MED ORDER — SODIUM CHLORIDE 0.9 % IV SOLN
12.5000 mg | Freq: Four times a day (QID) | INTRAVENOUS | Status: DC | PRN
Start: 2012-04-22 — End: 2012-04-26
  Administered 2012-04-22 – 2012-04-26 (×11): 12.5 mg via INTRAVENOUS
  Filled 2012-04-22 (×11): qty 0.5

## 2012-04-22 MED ORDER — SODIUM CHLORIDE 0.9 % IV SOLN
INTRAVENOUS | Status: DC | PRN
Start: 2012-04-22 — End: 2012-04-26

## 2012-04-22 MED ORDER — SODIUM CHLORIDE 0.9 % IJ SOLN (CUSTOM)
3.0000 mL | Freq: Three times a day (TID) | INTRAMUSCULAR | Status: DC
Start: 2012-04-22 — End: 2012-04-26
  Administered 2012-04-22 – 2012-04-24 (×7): 3 mL via INTRAVENOUS

## 2012-04-22 MED ORDER — HYDROMORPHONE HCL 1 MG/ML IJ SOLN
1.00 mg | Freq: Once | INTRAMUSCULAR | Status: AC
Start: 2012-04-22 — End: 2012-04-22
  Administered 2012-04-22: 1 mg via INTRAVENOUS
  Filled 2012-04-22 (×2): qty 1

## 2012-04-22 MED ORDER — HYDROMORPHONE HCL 1 MG/ML IJ SOLN
0.5000 mg | INTRAMUSCULAR | Status: DC | PRN
Start: 2012-04-22 — End: 2012-04-22

## 2012-04-22 NOTE — Interdisciplinary (Signed)
Received this pt from ED with D'x of abdl pain, up ad lib but crouching when ambulating r/t abdl pain as pt claimed. Pt is crying loudly r/t abdl pain. Pt is A&O x4 irritable & grouchy. Pt's skin is dry & intact. Oriented pt to her room , policies & safety measures. PADB not done/ completed this time as pt is not answering back questions. V/S taken & recorded.

## 2012-04-22 NOTE — Plan of Care (Signed)
Handoff report to Denah RN.

## 2012-04-22 NOTE — ED Notes (Signed)
Called report to [place], RN 6 [place]

## 2012-04-22 NOTE — Plan of Care (Signed)
One time dilaudid IV 0.5mg  one time was given and pt still requested her PRN dilaudid 1mg  since her pain level is high. Explained pt's pain regimen and she will get her dilaudid pain med every 2 hrs as needed. Needed to explained really clearly to pt her pain regimen. Updated the time of her pain board on when will be the next dose.Pt verbalized understanding.

## 2012-04-22 NOTE — Consults (Signed)
[facility] INITIAL CONSULT NOTE  04/22/2012  2:57 PM        Request for Consultation:   Asked by Fanny Bien, MD to evaluate this patient for internal hernia.    History of Present Illness:     Alexandria Proctor is a 56 year old female with a history of morbid obesity, who presents with 6 days of abdominal pain, nausea, vomiting and melanotic stools. 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    The patient denies having any episodes such as this before. She describes the pain as severe and sharp, mostly in the right lateral aspect of her abdomen. It is relieved with positional changes. Her vomit is white- nonbilious and nonbloody. She has not been able to eat as much because of the nausea for the past several days.     She continues to have regular BMs. Her nausea has improved since she was admitted.     Past Medical and Surgical History:  Past Medical History   Diagnosis Date   . Obesity      Past Surgical History   Procedure Laterality Date   . Gastric bypass     . Ventral hernia repair         Allergies:  Allergies   Allergen Reactions   . Compazine Anaphylaxis     Per patient tolerates promethazine/Phenergan   . Reglan (Metoclopramide) Anaphylaxis   . Toradol Anaphylaxis   . Tramadol Anaphylaxis     Per patient, tolerates hydromorphone (Dilaudid) and morphine    . Zofran (Ondansetron) Anaphylaxis       Prior to Admission Medications:  Prescriptions prior to admission   Medication Sig Dispense Refill   . Omeprazole (PRILOSEC PO)          No prescriptions prior to admission       Current Inpatient Medications:       . docusate sodium  250 mg HS   . HYDROmorphone  0.5 mg Once   . HYDROmorphone  0.5 mg Once   . HYDROmorphone  0.5 mg Once   . HYDROmorphone  1 mg Once   . HYDROmorphone  1 mg Once   . HYDROmorphone  1 mg Once   . DISCONTD: HYDROmorphone  1 mg Once   . HYDROmorphone  1 mg Once   . lansoprazole  30 mg  BID AC   . promethazine (PHENERGAN) IVPB  25 mg Once   . promethazine  25 mg Once   . sodium chloride (PF)  3 mL Q8H   . bolus IV fluid   Once          . sodium chloride     . sodium chloride 100 mL/hr at 04/22/12 0452          . acetaminophen  650 mg Q4H PRN   . DISCONTD: HYDROmorphone  0.5 mg Q4H PRN   . HYDROmorphone  0.5 mg Q2H PRN   . DISCONTD: HYDROmorphone  1 mg Q3H PRN   . DISCONTD: HYDROmorphone  1 mg Q4H PRN   . HYDROmorphone  1 mg Q2H PRN   . promethazine (PHENERGAN) IVPB  12.5 mg Q6H PRN   . sodium chloride (PF)  3 mL PRN   . sodium chloride   Continuous PRN       Social History:  History     Social History   . Marital Status: Single  Spouse Name: N/A     Number of Children: N/A   . Years of Education: N/A     Occupational History   . unemployed      Social History Main Topics   . Smoking status: Never Smoker    . Smokeless tobacco: Never Used   . Alcohol Use: No   . Drug Use: No   . Sexually Active: Not Currently     Other Topics Concern   . Not on file     Social History Narrative   . No narrative on file       Family History:  Noncontributory    Review of Systems:  Per HPI    Physical Exam:  Temperature:  [97.7 F (36.5 C)-98.8 F (37.1 C)] 97.7 F (36.5 C) (08/01 0800)  Blood pressure (BP): (132-156)/(78-102) 139/89 mmHg (08/01 0800)  Heart Rate:  [53-93] 60  (08/01 0800)  Respirations:  [18-20] 18  (08/01 0800)  Pain Score:  [-] 10 (08/01 1333)  O2 Device:  [-] None (Room air) (08/01 0415)  SpO2:  [98 %-100 %] 99 % (08/01 0800)  Gen: teary, uncomfortable  Non-labored breathing  Abd: soft, moderately distended, TTP in Right lateral abdomen, + voluntary guarding, nonrigid, no rebound tenderness, no peritoneal signs.     Labs and Other Data:  Lab Results   Component Value Date    NA 145 04/22/2012    K 3.4* 04/22/2012    CL 111* 04/22/2012    BICARB 26 04/22/2012    BUN 10 04/22/2012    CREAT 0.83 04/22/2012    GLU 71 04/22/2012    [place] 8.7 04/22/2012     Lab Results   Component Value Date    WBC 5.8 04/22/2012     HGB 11.3 04/22/2012    HCT 34.1 04/22/2012    PLT 299 04/22/2012    SEG 62 04/22/2012    LYMPHS 25 04/22/2012    MONOS 9 04/22/2012    EOS 4 04/22/2012     Lab Results   Component Value Date    AST 15 04/22/2012    ALT 15 04/22/2012    ALK 61 04/22/2012    TBILI 0.1 04/22/2012    DBILI <0.1 04/22/2012    TP 5.6* 04/22/2012    ALB 3.2* 04/22/2012     Lab Results   Component Value Date    INR 1.1 04/22/2012    PTT 29.8 04/22/2012     Lab Results   Component Value Date    PHUA 6.5 04/21/2012    SGUA 1.018 04/21/2012    GLUCOSEUA Negative 04/21/2012    KETONEUA Negative 04/21/2012    BLOODUA Negative 04/21/2012    PROTEINUA Trace* 04/21/2012    LEUKESTUA Trace* 04/21/2012    NITRITEUA Negative 04/21/2012    WBCUA 0-2 04/21/2012    RBCUA 0-2 04/21/2012       CT Abdomen 04/21/2012: 1. No evidence of obstruction and no fluid collections visualized in the   abdomen or pelvis. No acute findings to account for patient's abdominal pain.    2. 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    3. 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 Right renal cyst measuring 1.1   x 1 cm.    5. Grade 1 anterolisthesis of L4 on L5.      Impression:  30 F with abdominal pain and melena s/p roux-en-y gastric bypass ten years ago. Etiology of pain unclear, though CT scan showed no evidence of obstruction or hernias.     Recommendations:    - EGD to evaluate for ulcer  - If suspicion for internal hernia remains high, would recommend obtaining CT with IV and PO contrast     D/w Dr. Reola Calkins    Electronically signed by:    Prudencio Pair. Yuki Purves, MD  PGY-2 General Surgery  Pager: [phone]           .

## 2012-04-22 NOTE — Interdisciplinary (Signed)
2241 pt stating that she is not getting the right dose of pain medication. Pointed out to her the syringe of dilaudid 1mg /ml which is still sealed at this time. Pt continue to insist that it is not the right medication and went out of the room to talk to charge nurse Glacey.

## 2012-04-22 NOTE — Interdisciplinary (Signed)
Text paged MD as follows:   Pt in [address] continues to c/o abdominal pain 10/10 and the dilaudi is not due until 12:45. Pt would like to speak with medical team about pain management and would like something for pain added at this time. Pls advise. Pls crying and upset.Thx.

## 2012-04-22 NOTE — Plan of Care (Signed)
Problem: Discharge Planning  Goal: Participation in care planning  Outcome: Not Met  Plan for surgery consult,PPI,NPO ,continous IVF,pain control,monitor labs. Pt is aware about the plan.     Problem: Thromboemboli, Risk of  Goal: Absence of signs and symptoms of Thromboemboli  Outcome: Met  MOves all extremties, no order for heaprin or lovenox.     Problem: Falls - Risk of  Goal: Absence of falls  Outcome: Met  Injury:   I. Siderails up,call light and bedside table within reach,bed at the lowest position,bed alarm on and fall precaution sign at the door,hourly rounds,encouraged pt to call for assistance when getting OOB.  E: Pt calls for assistance only when shee neds pain med and nausea med. Gets up from bed,ambulatory .Pt remains free of injury.     Problem: Pain - Acute  Goal: Control of acute pain  Outcome: Met  Tearfuls and anxious when pt asked for her pain med. Dilaudid IV was given,c/o 10/10 abd pain.Refused to be touched in her abdomen. Will continue to reassess pain level.    Problem: Nausea/Vomiting  Goal: Absence of nausea  Outcome: Not Met  Promethazine IV was administered this morning. No emesis noted. Still continues to get IVF.    Problem: Coping - Ineffective, Patient and Family  Goal: Effective or improved coping  Outcome: Met  Tearful and anxious whenever I ask the pt on how she is doing. Gets easily irritated. Gets so suspicious if the right pain medication is being given to her. She required nurses to draw the medicine infront of her,her eyes followed even when I was just scanning her medications. PAged primary team to see if they can order psych consult for pt.

## 2012-04-22 NOTE — ED Notes (Signed)
Pt. Verbalized a decrease in abd pain from a 10/10 to a 5/10 post intervention

## 2012-04-22 NOTE — Interdisciplinary (Signed)
[facility] is sending a message to: [facility] / [phone]    [address] level today of 3.4. Do you want to replace?

## 2012-04-22 NOTE — ED Floor Report (Signed)
Alexandria Proctor is a 56 year old female    Chief Complaint   Patient presents with   . Abdominal Pain     constant mid abd pain x 5 days, increasing inseverity. reports black tarry stools(no blood) x 3 days.  reports nausea, vomit(no blood), chills, Temp of 102 yest.  denies dysuria/hematuria.  in triage FSHGB=13.8   note to nursing: pt states is a hard IV start, states gets IV's in neck       Admitted for ABDOMINAL PAIN    Code Status:  Please refer to In-pt admitting doctors orders     Past Medical History   Diagnosis Date   . Obesity        Past Surgical History   Procedure Laterality Date   . Gastric bypass     . Ventral hernia repair         Allergies: Compazine; Reglan; Toradol; Tramadol; and Zofran    Level of Care : med surg    Fall Risk: no    Skin issues:  no    >> If yes, note areas of skin breakdown. See appropriate photos.      Ambulatory:  yes    Sitter needed: no    Suicide Risk :  no    Isolation Required: no     >> If yes , what type of isolation:    Is patient in custody?  no    Is patient in restraints? no    Is patient on Heparin? no If yes, complete below:   Bolus=    Units      Units/hr   @   Mls/hour      Time of next PT/PTT draw:          Brief Summary of ED Visit (to include focused assessment and neuro status):  Pt. Presented c 10/10 abd pain for 5 days c dark tarry stools, hx of gastric bypass, CT abd showed fat stranding (pt was surgery consult) will be admitted to further investigate etiology of pain      See  RN SHIFT ASSESSMENT ADULT  for full assessment, exceptions will be listed.    Cardiac rhythm:  NSR    Oxygen Delivery: None    Filed Vitals:    04/21/12 1927 04/21/12 2348 04/22/12 0045 04/22/12 0046   BP: 136/95 156/102 152/90    Pulse: 93 54  54   Temp: 98.8 F (37.1 C) 97.7 F (36.5 C)     Resp: 18 20     Height: 5\' 6"  (1.676 m)      Weight: 83.008 kg (183 lb)      SpO2: 99% 98%         Lab Results   Component Value Date    WBC 6.8 04/21/2012    RBC 4.86 04/21/2012    HGB 13.7  04/21/2012    HCT 39.8 04/21/2012    MCV 81.9 04/21/2012    MCHC 34.4 04/21/2012    RDW 16.0* 04/21/2012    PLT 351 04/21/2012    MPV 9.8 04/21/2012       Lab Results   Component Value Date    NA 140 04/21/2012    K 4.3 04/21/2012    CL 105 04/21/2012    BICARB 25 04/21/2012    BUN 11 04/21/2012    CREAT 0.94 04/21/2012    GLU 86 04/21/2012    [place] 9.3 04/21/2012       No results found for this basename:  BNP, PHOS, MG, LACTATE, AMMONIA, IONCA, ARTIONCA       No results found for this basename: CPK, CKMBH, TROPONIN       No results found for this basename: PH, PCO2, O2CONTENT, IVHC3, IVBE, O2SAT, UNPH, UNPCO2, ARTPH, ARTPCO2, ARTO2CNT, IAHC3, IABE, ARTO2SAT, UNAPH, UNAPCO2       No results found for this visit on 04/22/12.    RADIOLOGIC STUDIES NOT COMPLETED: n/a  (none unless otherwise noted)    Active PICC Line / CVC Line / PIV Line / Drain / Airway / Intraosseous Line / Epidural Line / ART Line / Line Type / Wound    **None**            Any significant events and interventions with responses:  Pt has been administered 1mg  dilaudid prn for pain and phenergan (see MAR) for intermittent nausea, remains aaox3 speaking in clear full sentences, NSR on the monitor c regular rate and rhythm/no ectopy, skin pwd       Floor nurse informed that report is ready for review.  Opportunity to answer questions with floor RN face to face or by phone. [place] number [place] [phone] . ER RN Gareth Eagle to be contacted for any questions.    Has this report been updated?  no  By Who:   Time:   Additional information by updated user:

## 2012-04-22 NOTE — Plan of Care (Signed)
[facility] is sending a message to: FLOOR [[id number]] [facility] / [phone]    Call back number (Required) * REQ       Name (Required) * REQ       Message * REQ [id number] Do you want to consider psych consult for her? She 's been crying and anxious,gets paranoid at times with her medicaitons.Thanks      Message limit: 990 characters.    Cc to Recipient's Email

## 2012-04-22 NOTE — Plan of Care (Signed)
Attempted to continue with the PADB but pt declines to participate at this time, wants it done later tonight.

## 2012-04-22 NOTE — ED Notes (Signed)
Surgery team at bedside to evaluate pt

## 2012-04-22 NOTE — H&P (Signed)
HISTORY AND PHYSICAL    Attending MD:   Fanny Bien, MD    Chief Complaint:  Abdominal pain     Pain Assessment:  The patient endorses pain as 10 out of 10, located abdomen, and described as sharp.    History of Present Illness:     Alexandria Proctor is a 56 year old female who is here for evaluation of the above chief complaint.  Patient is s/p gastric bypass in [place] in 2003 and multiple surgeries for a ventral hernia.  Presents to ED reporting 5 days of abdominal pain, 3 days of melena.  +N/V.  Denies hematemesis.  Nml bowel movements.  Passing gas.     Past Medical and Surgical History:  Past Medical History   Diagnosis Date   . Obesity      Past Surgical History   Procedure Laterality Date   . Gastric bypass     . Ventral hernia repair         Allergies:  Allergies   Allergen Reactions   . Compazine Anaphylaxis     Per patient tolerates promethazine/Phenergan   . Reglan (Metoclopramide) Anaphylaxis   . Toradol Anaphylaxis   . Tramadol Anaphylaxis     Per patient, tolerates hydromorphone (Dilaudid) and morphine    . Zofran (Ondansetron) Anaphylaxis       Medications:  Prescriptions prior to admission   Medication Sig Dispense Refill   . Omeprazole (PRILOSEC PO)          No prescriptions prior to admission       Social History:  History     Social History   . Marital Status: Single     Spouse Name: N/A     Number of Children: N/A   . Years of Education: N/A     Occupational History   . unemployed      Social History Main Topics   . Smoking status: Never Smoker    . Smokeless tobacco: Never Used   . Alcohol Use: No   . Drug Use: No   . Sexually Active: Not Currently     Other Topics Concern   . Not on file     Social History Narrative   . No narrative on file       Family History:  No family history on file.    Review of Systems:  As per HPI and PMHx    Physical Exam:  BP 132/85  Pulse 53  Temp(Src) 97.9 F (36.6 C)  Resp 20  Ht 5\' 6"  (1.676 m)  Wt 82.101 kg (181 lb)  BMI 29.23 kg/m2  SpO2 100%    Patient  is grimacing in bed upon entering exam room  Not diaphoretic or toxic appearing  Non-labored breathing  abd is obese/soft/distended/very tender to palpation in RUQ and epigastric area.  Diffuse mild tender to palpation. +voluntary guarding. large scars c/w surgical hx.     Labs and Other Data:  Lab Results   Component Value Date    NA 140 04/21/2012    K 4.3 04/21/2012    CL 105 04/21/2012    BICARB 25 04/21/2012    BUN 11 04/21/2012    CREAT 0.94 04/21/2012    GLU 86 04/21/2012    [place] 9.3 04/21/2012     Lab Results   Component Value Date    WBC 6.8 04/21/2012    HGB 13.7 04/21/2012    HCT 39.8 04/21/2012    PLT 351 04/21/2012  SEG 69 04/21/2012    LYMPHS 19 04/21/2012    MONOS 8 04/21/2012    EOS 3 04/21/2012     Lab Results   Component Value Date    AST 27 04/21/2012    ALT 19 04/21/2012    ALK 77 04/21/2012    TBILI 0.2 04/21/2012    TP 6.9 04/21/2012    ALB 4.0 04/21/2012     Lab Results   Component Value Date    INR 1.0 04/21/2012    PTT 33.3 04/21/2012       CT: Non specific findings of small stranding along paracolic gutter adjacent to right colon and very small free fluid in cul-de-sac.      Assessment and Care Plan:  22F s/p GB surgery with abdominal pain of unclear etiology.  CT scan without evidence of acute intra-abdominal process including abscess, perforation, ventral hernia, internal hernia, obstruction or ischemic gut.  Vitals wnml.  No leukocytosis.  In short, exam is concerning but does not correlate to her otherwise normal CT, labs and vitals.  Given this unusual case an observation and further work up is warranted, but cannot rule out psychiatric component to pain in the differential.    - Admit [facility]  - Consult [facility] in AM for further input  - Patient needs Upper endoscopy to rule out marginal ulcer, gastritis, and other etiologies of pain and upper GI bleed  - PPI  - NPO and IVF   - pain control  - Monitor labs and vitals    Plan discussed with Dr. Noah Charon    This plan and alternatives have been  discussed with the patient and/or surrogate.    Code Status:    Code Status  Full Code - Call Code    The patient's primary care physician or clinic has not been contacted regarding this admission.    Note Author: Reather Laurence, 04/22/2012, 5:18 AM      .

## 2012-04-22 NOTE — Interdisciplinary (Signed)
[facility] is sending a message to: [facility] / [phone]    [address]    pt complaining that present dose of dilaudid not helping with the pain. pls address.   thank you.

## 2012-04-22 NOTE — Interdisciplinary (Signed)
[facility] is sending a message to: [facility] / [phone]    [address]    No she does not want an NGT. She had it before and it hurt too much.

## 2012-04-22 NOTE — ED Notes (Signed)
Signout Note    Assuming care of this 56 year old female with abdominal pain.  Gastric bypass about 10 years ago.  Now dark tarry stools and epigastric pain with persistent nausea/vomiting x3d (heme negative, Hgb stable).  CT completed, read pending.  Surgical consultation in progress.    Etter Sjogren, MD  04/22/12 Lyda Jester

## 2012-04-22 NOTE — Procedures (Signed)
Report Author:  Desiree Lucy, M.D.    Date of Operation:  04/22/2012    Endoscopist: Leda Roys Copur-Dahi    GI Fellow: Karren Cobble    Referring Physician:    PROCEDURE PERFORMED: EGD    INDICATIONS FOR EXAMINATION: 56 y/o female patient  with Roux-en-Y gastric bypass surgery in 2003 is here with  abdominal pain and melena.    INSTRUMENTS: 746    MEDICATIONS: Demerol 100 mg IVP, Versed 6 mg IVP,  Lidocaine Gel 2%/5 mL  NEED FOR ANESTHESIA:No    The attending physician, Dr.Cesily Cuoco Copur-Dahi, was  present for the entire examination.    PROCEDURE TECHNIQUE: A evaluation was performed.  Informed consent was obtained from the patient after  explaining all the risks (perforation, bleeding, infection and  adverse effects to the medicine), benefits and alternatives  to the procedure which the patient appeared to understand  and so stated.  The patient was connected to the  monitoring devices and placed in the left lateral position.  Continuous oxygen was provided with a nasal cannula and  IV medicine administered through a indwelling cannula.  After adequate conscious sedation was achieved, the  patient was intubated and the scope advanced under direct  visualization to extent of exam.  The scope was  subsequently removed slowly while carefully examining the  color, texture, anatomy, and integrity of the mucosa on the  way out. The patient was subsequently transferred to the  recovery area in satisfactory condition.      COMPLICATIONS: None  ESTIMATED BLOOD LOSS: None  BIOPSY TAKEN: No  BOWEL PREP QUALITY:  EXTENT OF EXAM: jejunum    Findings: 1.Two small clean based ulcerations at the  gasrtojejunal anastomotic site consistent with marginal  ulcers. It is unlikely these findings explain her right mid to  lower abdominal pain  2.Normal afferent and efferent limbs  3.Small gastric pouch (5cm)  4.Otherwise normal exam    Endoscopic Diagnosis: 1.Two small clean based ulcerations  at the gasrtojejunal anastomotic site  consistent with  marginal ulcers. It is unlikely these findings explain her right  mid to lower abdominal pain  2.Normal afferent and efferent limbs  3.Small gastric pouch (5cm)    Recommendations: 1. Continue PPI  2.Consider small bowel imaging either with small bowel  follow-through or CT with oral contrast or CT enterography  per bariatric surgery team  3.She should also have a colonoscopy which can be done  as an outpatient  4. Monitor CBC and watch for further bleeding            Electronically signed by:  Desiree Lucy, M.D. 04/22/2012 10:03 P          DD: 04/22/2012    DT:  04/22/2012 03:15 P  DocNo.:  [id number]  NC/bsm    Referring Physician:  Evelene Croon MD  [address]  [place] [address]    Primary Care Physician:  NO PCP PER PATIENT        cc:

## 2012-04-22 NOTE — Plan of Care (Signed)
Pt is back forom GI,still sedated but still asking for pain med PRN. Will hold pain med,pt went back to sleep.On O2 at 2L/nc,sating at 97%.

## 2012-04-22 NOTE — ED Notes (Signed)
Received report from Tsosie Billing, pt currently in CT, here for ABD pain, surgery to consult

## 2012-04-22 NOTE — ED Notes (Signed)
Returns from ct.

## 2012-04-22 NOTE — ED Notes (Addendum)
SO note  56 yo F hx gastric bypass, multiple abd surgeries  -anticipate admission to surgery vs medicine    Alexandria Lan, MD  Resident  04/22/12 [phone]    CT showed nonspecific fat stranding.  Pt admitted to surgery.     Alexandria Lan, MD  Resident  04/22/12 [phone]

## 2012-04-22 NOTE — Consults (Signed)
GASTROENTEROLOGY CONSULTATION  Fellow: Karren Cobble  Consult Physician: Dr. Brunetta Genera  Requesting Physician: Fanny Bien, MD  Reason for Consultation: Melena    History of Present Illness:     56 year old woman with history of gastric bypass complicated by umbilical hernia s/p multiple repairs presented with melena and abdominal pain for four days.  Pain is located on the right of the abdomen, is nonradiating, stabbing in nature, exacerbated by movement or eating.  She also developed nausea and vomiting 4 days ago.  Vomit is clear and foamy without blood. She is tolerating a soft diet.  She has noticed black sticky, foul smelling stools as well.  She has never had a colonoscopy or EGD before.  Denies NSAID use.    Past Medical and Surgical History:  Past Medical History   Diagnosis Date   . Obesity      Past Surgical History   Procedure Laterality Date   . Gastric bypass     . Ventral hernia repair         Allergies:  Allergies   Allergen Reactions   . Compazine Anaphylaxis     Per patient tolerates promethazine/Phenergan   . Reglan (Metoclopramide) Anaphylaxis   . Toradol Anaphylaxis   . Tramadol Anaphylaxis     Per patient, tolerates hydromorphone (Dilaudid) and morphine    . Zofran (Ondansetron) Anaphylaxis       Medications:  Prescriptions prior to admission   Medication Sig Dispense Refill   . Omeprazole (PRILOSEC PO)          No prescriptions prior to admission       Social History:  History     Social History   . Marital Status: Single     Spouse Name: N/A     Number of Children: N/A   . Years of Education: N/A     Occupational History   . unemployed      Social History Main Topics   . Smoking status: Never Smoker    . Smokeless tobacco: Never Used   . Alcohol Use: No   . Drug Use: No   . Sexually Active: Not Currently     Other Topics Concern   . Not on file     Social History Narrative   . No narrative on file       Family History:  Mother - kidney failure  Father - Alzhiemer's    Review of  Systems:  10 point review of systems negative except as per HPI and:  Intentional weight loss of 200 lb after gastric bypass    Physical examination:   BP 145/87  Pulse 58  Temp(Src) 98.2 F (36.8 C)  Resp 18  Ht 5\' 6"  (1.676 m)  Wt 82.101 kg (181 lb)  BMI 29.23 kg/m2  SpO2 97% Body mass index is 29.23 kg/(m^2).  Wt Readings from Last 2 Encounters:   04/22/12 82.101 kg (181 lb)      Blood Pressure   04/22/12 145/87      Pain Score: 10  General:  Well developed, well nourished in no apparent distress.  Affect:  Normal  HEENT:  No scleral icterus, mucus membranes moist.  Cardiovascular:  Regular rate and rhythm without murmurs, rubs or gallops.  Lungs:  Clear to auscultation bilaterally.   Abdomen:  Bowel sounds present.  Soft, non-distended. Exquisite tenderness in the right lower quadrant.  Extremities:  No peripheral edema. Warm well-perfused. No cyanosis or clubbing.  Skin:  No jaundice and  no visible rash  Neuro: Alert and oriented x 4    Labs:  CBC  Lab Results   Component Value Date    WBC 5.8 04/22/2012    HGB 11.3 04/22/2012    HCT 34.1 04/22/2012    MCV 83.8 04/22/2012    PLT 299 04/22/2012     Chemistry  Lab Results   Component Value Date    NA 145 04/22/2012    K 3.4 04/22/2012    CL 111 04/22/2012    BICARB 26 04/22/2012    BUN 10 04/22/2012    CREAT 0.83 04/22/2012    GLU 71 04/22/2012     Liver  Lab Results   Component Value Date    TBILI 0.1 04/22/2012    DBILI <0.1 04/22/2012    AST 15 04/22/2012    ALT 15 04/22/2012    ALK 61 04/22/2012    TP 5.6 04/22/2012    ALB 3.2 04/22/2012     Coags  Lab Results   Component Value Date    INR 1.1 04/22/2012    PTT 29.8 04/22/2012     Images:  CT abdomen pelvis:  IMPRESSION: CT scan with IV contrast    1. No evidence of obstruction and no fluid collections visualized in the   abdomen or pelvis. No acute findings to account for patient's abdominal pain.    2. 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    3. 6-mm hypoattenuating lesion in segment  6 of the liver, too small to   characterize    4. Congenital malrotation of the right kidney. Right renal cyst measuring 1.1   x 1 cm.    5. Grade 1 anterolisthesis of L4 on L5.    EGD:  Findings: 1.Two small clean based ulcerations at the   gasrtojejunal anastomotic site consistent with marginal   ulcers. It is unlikely these findings explain her right mid to   lower abdominal pain   2.Normal afferent and efferent limbs   3.Small gastric pouch (5cm)   4.Otherwise normal exam   Endoscopic Diagnosis: 1.Two small clean based ulcerations   at the gasrtojejunal anastomotic site consistent with   marginal ulcers. It is unlikely these findings explain her right   mid to lower abdominal pain   2.Normal afferent and efferent limbs   3.Small gastric pouch (5cm)   Recommendations: 1. Continue PPI   2.Consider small bowel imaging either with small bowel   follow-through or CT with oral contrast or CT enterography   per bariatric surgery team   3.She should also have a colonoscopy which can be done   as an outpatient   4. Monitor CBC and watch for further bleeding         Assessment and Care Plan:   56 yo woman with history of multiple abdominal surgeries including gastric bypass presented with melena and RLQ abdominal pain.  She has no distention on exam, but complains of significant tenderness to light palpation.  EGD today showed 2 small clean based ulcers at the anastomotic site which are not likely to be the source of her RLQ pain.  Further imaging of her small bowel with small bowel follow-through, CT enterography or CT with oral contrast may be needed to assess her anatomy given the location of her pain.  Also, she has not had a colonoscopy and melena can be seen with right sided colonic lesions.  She should have colonoscopy done, but this  does not have to be done while inpatient.    -Continue PPI  -Monitor CBC and assess for further bleeding  -Consider small bowel imaging with small bowel follow-through, CT enterography  or CT with oral contrast. Modality to be determined by primary team  -She should also have a colonoscopy but this can be done as an outpatient.    Thank you for this consultation.    This patient was seen by and discussed with Dr. Ilda Foil.    Electronically Signed by:  Karren Cobble  Gastroenterology Fellow  04/22/2012 6:46 PM        GI ATTENDING ADDENDUM    Subjective    I have reviewed the history and confirmed the findings.    Objective    I have examined the patient and concur with the fellow's exam.  I rev'ed the labs and imaging.    Assessment and Plan    I agree with the fellow's plan.    See the fellow's note for further details.      Desiree Lucy, MD  [place], Laurette Schimke

## 2012-04-22 NOTE — Interdisciplinary (Signed)
FLOOR [[id number]] [facility] / [phone]   FYI pt Alexandria Proctor pls call back [phone] as I need a 1x dose of pain med this time as previous dose @ 0342 is not helping her she claimed . Thanks, [employer] RN [phone]

## 2012-04-22 NOTE — Progress Notes (Signed)
Progress Note  [facility]/Trauma    Patient Name: Johnsie Kindred  MRN: [id number]  Room#: 213/086V    Hospital Day:   0 days - Admitted on: 04/22/2012 HD#: 1  Service: Surgery, White    56 year old female admitted with history of gastric bypass surgery w/5d history of intense abdominal pain with n/v.    SUBJECTIVE: Patient admitted to service. Patient complaining of abdominal pain 10/10. NPO.    OBJECTIVE:  Vital Signs:     Latest Entry  Range (last 24 hours)    Temperature: 97.9 F (36.6 C)  Temp  Avg: 98 F (36.7 C)  Min: 97.7 F (36.5 C)  Max: 98.8 F (37.1 C)    Blood pressure (BP): 132/85 mmHg  BP  Min: 132/85  Max: 156/78    Heart Rate: 53   Pulse  Avg: 68.4   Min: 53   Max: 93     Respirations: 20   Resp  Avg: 19   Min: 18   Max: 20     SpO2: 100 %  SpO2  Avg: 99 %  Min: 98 %  Max: 100 %     Weight - scale: 82.101 kg (181 lb)  Percentage Weight Change (%): -1.09 %    07/31 0600 - 08/01 0559  In: 1000 [I.V.:1000]  Out: -   Bowel movement: 0    Physical:    Gen: nontoxic appearing, lying in bed  Pulm: nonlabored breathing  Abd: soft, obese, diffusely tender to palpation, no rebound tenderness,     Labs:     CBC  Recent Labs      04/21/12   2114  04/22/12   0555   WBC  6.8  5.8   HGB  13.7  11.3   HCT  39.8  34.1   PLT  351  299   SEG  69  62   LYMPHS  19  25   MONOS  8  9        Chemistry  Recent Labs      04/21/12   2114  04/22/12   0500   NA  140  145   K  4.3  3.4*   CL  105  111*   BICARB  25  26   BUN  11  10   CREAT  0.94  0.83   GLU  86  71   [place]  9.3  8.7     Recent Labs      04/21/12   2114  04/22/12   0500   ALK  77  61   AST  27  15   ALT  19  15   TBILI  0.2  0.1   DBILI   --   <0.1   ALB  4.0  3.2*          Coags  Recent Labs      04/21/12   2114  04/22/12   0500   PT  10.6  10.9   PTT  33.3  29.8   INR  1.0  1.1       Toxicology:  No results found for this basename: ETOHPLASMA, AMPHETCL, BARBCLASS, BENZDIAZCL, BENZYLCGNN, METHADONE, OPIATESCL, OXY, PHENCYCLDN, PROPOXYPHN, TETCANNABIN       Radiology:    CXR: No results found for this visit on 04/22/12.  CT Head: No results found for this visit on 04/22/12.  CT C-spine: No results found for this visit on 04/22/12.  CT Abdomen/Pelvis:  No results found for this visit on 04/22/12.    Venous Duplex Upper: No results found for this visit on 04/22/12.  Venous Duplex Lower:No results found for this visit on 04/22/12.    Medications:  Scheduled Meds       . docusate sodium  250 mg HS   . HYDROmorphone  0.5 mg Once   . HYDROmorphone  0.5 mg Once   . HYDROmorphone  1 mg Once   . HYDROmorphone  1 mg Once   . HYDROmorphone  1 mg Once   . DISCONTD: HYDROmorphone  1 mg Once   . HYDROmorphone  1 mg Once   . lansoprazole  30 mg BID AC   . promethazine (PHENERGAN) IVPB  25 mg Once   . promethazine  25 mg Once   . sodium chloride (PF)  3 mL Q8H   . bolus IV fluid   Once     PRN Meds       . acetaminophen  650 mg Q4H PRN   . HYDROmorphone  0.5 mg Q4H PRN   . DISCONTD: HYDROmorphone  1 mg Q3H PRN   . HYDROmorphone  1 mg Q4H PRN   . promethazine (PHENERGAN) IVPB  12.5 mg Q6H PRN   . sodium chloride (PF)  3 mL PRN   . sodium chloride   Continuous PRN       ASSESSMENT / PLAN:    56 year old female admitted w/history of gastric bypass now with abdominal pain otherwise normal imaging and labs. Will continue workup for etiology of abdominal pain and optimize pain control. Will consult [facility] given patient's history of gastric bypass.     Neuro/Pain: pain control with IV dilaudid  CV: hemodynamically stable, no issues  FEN/GI: continue NPO, NS@100 , replete electrolytes prn, serial abdominal exams, f/u lactate  Patient new to area, will request outside records.      Patient seen and discussed with chief on call.    Tomasa Rand  General Surgery, PGY1  [phone]

## 2012-04-22 NOTE — ED Notes (Signed)
Pt to ct via gurney

## 2012-04-22 NOTE — Consults (Addendum)
HISTORY AND PHYSICAL    Chief Complaint:  Abdominal pain    History of Present Illness:     Alexandria Proctor is a 56 year old female who is here for evaluation of the above chief complaint.  She has a history of an open RNYGB followed by several open VHR at an OSH in [place] starting 10 years ago.  She presented to the ED last night complaining of severe abdominal pain.  She also reported some nausea and vomiting although was having bowel movements.  She complains of pain in the RLQ.  The pain has been present for 6 days and is constant.  She denies hematemesis or bilious emesis.  She denies fevers or chills.    Past Medical and Surgical History:  Past Medical History   Diagnosis Date   . Obesity      Past Surgical History   Procedure Laterality Date   . Gastric bypass     . Ventral hernia repair         Allergies:  Allergies   Allergen Reactions   . Compazine Anaphylaxis     Per patient tolerates promethazine/Phenergan   . Reglan (Metoclopramide) Anaphylaxis   . Toradol Anaphylaxis   . Tramadol Anaphylaxis     Per patient, tolerates hydromorphone (Dilaudid) and morphine    . Zofran (Ondansetron) Anaphylaxis       Medications:  Prescriptions prior to admission   Medication Sig Dispense Refill   . Omeprazole (PRILOSEC PO)          No prescriptions prior to admission       Social History:  History     Social History   . Marital Status: Single     Spouse Name: N/A     Number of Children: N/A   . Years of Education: N/A     Occupational History   . unemployed      Social History Main Topics   . Smoking status: Never Smoker    . Smokeless tobacco: Never Used   . Alcohol Use: No   . Drug Use: No   . Sexually Active: Not Currently     Other Topics Concern   . Not on file     Social History Narrative   . No narrative on file       Family History:  No family history on file.    Review of Systems:  - fevers, - chills, + emesis, + nausea - hematemesis, + flatus, + bowel movements, - chest pain, - SOB    Physical Exam:  BP 145/87   Pulse 58  Temp(Src) 98.2 F (36.8 C)  Resp 18  Ht 5\' 6"  (1.676 m)  Wt 82.101 kg (181 lb)  BMI 29.23 kg/m2  SpO2 97%  Examined just after her EGD.  Still somewhat tired from medication from procedure.    Ab soft, TTP RLQ, no peritoneal signs.      Labs and Other Data:  Lab Results   Component Value Date    NA 145 04/22/2012    K 3.4* 04/22/2012    CL 111* 04/22/2012    BICARB 26 04/22/2012    BUN 10 04/22/2012    CREAT 0.83 04/22/2012    GLU 71 04/22/2012    [place] 8.7 04/22/2012     Lab Results   Component Value Date    WBC 5.8 04/22/2012    HGB 11.3 04/22/2012    HCT 34.1 04/22/2012    PLT 299 04/22/2012  Lab Results   Component Value Date    AST 15 04/22/2012    ALT 15 04/22/2012    ALK 61 04/22/2012    TBILI 0.1 04/22/2012    DBILI <0.1 04/22/2012    TP 5.6* 04/22/2012    ALB 3.2* 04/22/2012     Lab Results   Component Value Date    INR 1.1 04/22/2012    PTT 29.8 04/22/2012     CT scan:  1. No evidence of obstruction and no fluid collections visualized in the   abdomen or pelvis. No acute findings to account for patient's abdominal pain.    2. 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    3. 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 Right renal cyst measuring 1.1   x 1 cm.    5. Grade 1 anterolisthesis of L4 on L5.          Assessment and Care Plan  Abdominal pain of unknown etiology  Does not appear to be specific to her history of distant RNYGB.  CT and symptoms not consistent with internal hernia.  EGD negative for marginal ulcer disease or limb obstruction.    Could consider CT scan with Iv and PO contrast to ensure no signs of obstruction at her J-J anastomosis or distally.    Please call the silver service with any questions.  Discussed with Dr. Cheron Schaumann    Note Author: Nicholes Stairs, 04/22/2012, 4:49 PM        Attending Addendum:    Patient seen and examined - agree with above note.  She is a 56yo  woman admitted with abdominal pain of unclear etiology who has had multiple procedures in the past, including RYGB and multiple ventral hernia repairs,    On exam, she was sitting up in bed chatting on cell phone upon entering the room but then laid down and began to complain of significant pain when I started to examine her.  Her abdomen is soft to palpation.  No guarding or rebound present.    CT reviewed - no oral contrast given - no dilated loops of bowel, no edema present.    Agree with EGD plan, rule out pancreatitis, supportive care.  Please call with any questions.    Mickle Asper Cheron Schaumann, MD    Assistant Professor of Surgery  Division of Minimally Invasive Surgery, Department of Surgery    Email: [email or web address]  Office: [phone]

## 2012-04-22 NOTE — Plan of Care (Signed)
Pt went down for EGD

## 2012-04-22 NOTE — Interdisciplinary (Signed)
FLOOR [[id number]] [facility] / [phone]  FYI pt Alexandria Proctor is here now in 6W rm 634C thus needs orders esp. pain medication as well as antiemetics prn. Pt is here crying out loud for pain med needs. Pls address, thanks. Fayrene Fearing RN [phone]

## 2012-04-23 LAB — COMPREHENSIVE METABOLIC PANEL, BLOOD
ALT (SGPT): 15 U/L (ref 0–33)
AST (SGOT): 18 U/L (ref 0–32)
Albumin: 3.5 g/dL (ref 3.5–5.2)
Alkaline Phos: 60 U/L (ref 35–140)
BUN: 6 mg/dL (ref 6–20)
Bicarbonate: 27 mmol/L (ref 22–29)
Bilirubin, Tot: 0.2 mg/dL (ref <1.2)
Calcium: 9.1 mg/dL (ref 8.6–10.5)
Chloride: 106 mmol/L (ref 98–107)
Creatinine: 0.8 mg/dL (ref 0.51–0.95)
GFR: >60 mL/min
Glucose: 63 mg/dL — ABNORMAL LOW (ref 70–115)
Potassium: 3.6 mmol/L (ref 3.5–5.1)
Sodium: 141 mmol/L (ref 136–145)
Total Protein: 5.7 g/dL — ABNORMAL LOW (ref 6.0–8.0)

## 2012-04-23 LAB — CBC WITH DIFF, BLOOD
ANC-Automated: 2.4 10*3/uL (ref 1.6–7.0)
ANC-Instrument: 2.4 10*3/uL (ref 1.6–7.0)
Abs Eosinophils: 0.3 10*3/uL (ref 0.0–0.5)
Abs Lymphs: 1.6 10*3/uL (ref 0.8–3.1)
Abs Monos: 0.5 10*3/uL (ref 0.2–0.8)
Basophils: 1 % (ref 0–2)
Eosinophils: 7 % (ref 1–7)
Hct: 32.9 % — ABNORMAL LOW (ref 34.0–45.0)
Hgb: 11.1 gm/dL — ABNORMAL LOW (ref 11.2–15.7)
Lymphocytes: 32 % (ref 19–53)
MCH: 28.2 pg (ref 26.0–32.0)
MCHC: 33.7 % (ref 32.0–36.0)
MCV: 83.5 um3 (ref 79.0–95.0)
MPV: 9.2 fL — ABNORMAL LOW (ref 9.4–12.4)
Monocytes: 10 % (ref 5–12)
Plt Count: 282 10*3/uL (ref 140–370)
RBC: 3.94 10*6/uL (ref 3.90–5.20)
RDW: 15.9 % — ABNORMAL HIGH (ref 12.0–14.0)
Segs: 51 % (ref 34–71)
WBC: 4.8 10*3/uL (ref 4.0–10.0)

## 2012-04-23 MED ORDER — DIPHENHYDRAMINE HCL 50 MG/ML IJ SOLN
25.00 mg | Freq: Once | INTRAMUSCULAR | Status: AC
Start: 2012-04-23 — End: 2012-04-23
  Administered 2012-04-23: 25 mg via INTRAVENOUS
  Filled 2012-04-23: qty 50

## 2012-04-23 MED ORDER — CIPROFLOXACIN IN D5W 400 MG/200ML IV SOLN
400.0000 mg | Freq: Two times a day (BID) | INTRAVENOUS | Status: DC
Start: 2012-04-23 — End: 2012-04-26
  Administered 2012-04-23 – 2012-04-26 (×6): 400 mg via INTRAVENOUS
  Filled 2012-04-23 (×6): qty 200

## 2012-04-23 MED ORDER — MORPHINE SULFATE 4 MG/ML IJ SOLN
4.0000 mg | INTRAMUSCULAR | Status: DC | PRN
Start: 2012-04-23 — End: 2012-04-24
  Administered 2012-04-23 (×2): 4 mg via INTRAVENOUS
  Filled 2012-04-23 (×2): qty 1

## 2012-04-23 MED ORDER — POTASSIUM CHLORIDE CRYS CR 10 MEQ OR TBCR
20.00 meq | EXTENDED_RELEASE_TABLET | Freq: Once | ORAL | Status: AC
Start: 2012-04-23 — End: 2012-04-24
  Filled 2012-04-23: qty 2

## 2012-04-23 MED ORDER — HYDROMORPHONE HCL 1 MG/ML IJ SOLN
1.00 mg | Freq: Once | INTRAMUSCULAR | Status: AC | PRN
Start: 2012-04-23 — End: 2012-04-24
  Administered 2012-04-24: 1 mg via INTRAVENOUS
  Filled 2012-04-23: qty 1

## 2012-04-23 MED ORDER — METRONIDAZOLE IN NACL 5-0.79 MG/ML-% IV SOLN
500.0000 mg | Freq: Three times a day (TID) | INTRAVENOUS | Status: DC
Start: 2012-04-23 — End: 2012-04-26
  Administered 2012-04-23 – 2012-04-26 (×9): 500 mg via INTRAVENOUS
  Filled 2012-04-23 (×9): qty 100

## 2012-04-23 MED ORDER — SODIUM CHLORIDE 0.9 % IV SOLN
3375.0000 mg | Freq: Three times a day (TID) | INTRAVENOUS | Status: DC
Start: 2012-04-23 — End: 2012-04-23
  Administered 2012-04-23 (×2): 3375 mg via INTRAVENOUS
  Filled 2012-04-23 (×2): qty 3375

## 2012-04-23 NOTE — Progress Notes (Signed)
Progress Note  White/Trauma    Patient Name: Alexandria Proctor  MRN: [id number]  Room#: 540/981X    Hospital Day:   1 day - Admitted on: 04/22/2012 HD#: 1  Service: Surgery, White    56 year old female admitted with history of gastric bypass surgery w/5d history of intense abdominal pain with n/v.    SUBJECTIVE:  Patient still with complaints of abdominal pain. NPO.    OBJECTIVE:  Vital Signs:     Latest Entry  Range (last 24 hours)    Temperature: 97.3 F (36.3 C)  Temp  Avg: 97.7 F (36.5 C)  Min: 97.3 F (36.3 C)  Max: 98.2 F (36.8 C)    Blood pressure (BP): 130/82 mmHg  BP  Min: 130/82  Max: 145/87    Heart Rate: 63   Pulse  Avg: 60.3   Min: 58   Max: 63     Respirations: 18   Resp  Avg: 18   Min: 18   Max: 18     SpO2: 96 %  SpO2  Avg: 97.3 %  Min: 96 %  Max: 99 %     Weight - scale: 82.101 kg (181 lb)  Percentage Weight Change (%): -1.09 %    08/01 0600 - 08/02 0559  In: 1451.7 [I.V.:1451.7]  Out: -   Bowel movement: x1 (black and foul-smelling per patient)    Physical:    Gen: nontoxic appearing, sitting at edge of bed  Abd: soft, obese, diffusely tender to palpation with more tenderness in RUQ, no rebound tenderness, no guarding    Labs:     CBC  Recent Labs      04/21/12   2114  04/22/12   0555   WBC  6.8  5.8   HGB  13.7  11.3   HCT  39.8  34.1   PLT  351  299   SEG  69  62   LYMPHS  19  25   MONOS  8  9        Chemistry  Recent Labs      04/21/12   2114  04/22/12   0500   NA  140  145   K  4.3  3.4*   CL  105  111*   BICARB  25  26   BUN  11  10   CREAT  0.94  0.83   GLU  86  71   [place]  9.3  8.7     Recent Labs      04/21/12   2114  04/22/12   0500   ALK  77  61   AST  27  15   ALT  19  15   TBILI  0.2  0.1   DBILI   --   <0.1   ALB  4.0  3.2*          Coags  Recent Labs      04/21/12   2114  04/22/12   0500   PT  10.6  10.9   PTT  33.3  29.8   INR  1.0  1.1       Toxicology:  No components found with this basename: ETOHPLASMA,  AMPHETCL,  BARBCLASS,  BENZDIAZCL,  BENZYLCGNN,  METHADONE,  OPIATESCL,  OXY,   PHENCYCLDN,  PROPOXYPHN,  TETCANNABIN       Radiology:   CT Scan (on admission)  IMPRESSION: CT scan with IV contrast    1. No evidence  of obstruction and no fluid collections visualized in the   abdomen or pelvis. No acute findings to account for patient's abdominal pain.    2. 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    3. 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 Right renal cyst measuring 1.1   x 1 cm.    5. Grade 1 anterolisthesis of L4 on L5.      CT Scan (08/01 @ 20:00)  Prelim read "inflammation of ascending colon"      Medications:  Scheduled Meds       . docusate sodium  250 mg HS   . HYDROmorphone  0.5 mg Once   . lansoprazole  30 mg BID AC   . piperacillin-tazobactam (ZOSYN) IVPB  3,375 mg Q8H   . potassium chloride  20 mEq Q1H   . sodium chloride (PF)  3 mL Q8H     PRN Meds       . acetaminophen  1,000 mg Q6H PRN   . acetaminophen  650 mg Q4H PRN   . DISCONTD: HYDROmorphone  0.5 mg Q4H PRN   . HYDROmorphone  0.5 mg Q2H PRN   . DISCONTD: HYDROmorphone  1 mg Q4H PRN   . HYDROmorphone  1 mg Q2H PRN   . promethazine (PHENERGAN) IVPB  12.5 mg Q6H PRN   . sodium chloride (PF)  3 mL PRN   . sodium chloride   Continuous PRN       ASSESSMENT / PLAN:    56 year old female admitted w/history of gastric bypass now with abdominal pain otherwise normal imaging and labs. Given prelim CT read, patient likely with infectious etiology for current symptoms. Will continue to monitor    Neuro/Pain: pain control with IV dilaudid  CV: hemodynamically stable, no issues  FEN/GI: continue NPO, NS@100 , replete electrolytes prn, serial abdominal exams, stool cx and C.diff pending  Patient new to area, will request outside records.      Patient seen and discussed with chief on call.    Tomasa Rand  General Surgery, PGY1  [phone]

## 2012-04-23 NOTE — Plan of Care (Signed)
Problem: Discharge Planning  Goal: Participation in care planning  The following text page was sent to first call MD to pager number [phone] at 1112: "[address] [place]: AM lab results drawn at 0920 show low glucose of 63 (pt. asymptomatic) and K+ of 3.6. Pt. on NS IVF at 100 mL/hr - can IVFs with D5 and KCl be considered?? Also, Pt. asking to speak with MD about pain control - pt. always reports 10/10 pain, even after administration of pain medications.  Thanks, Vinnie Langton [phone]"

## 2012-04-23 NOTE — Interdisciplinary (Signed)
[id number] is sending a message to: ICU  [facility] / [phone]     Pt Alexandria Proctor  Paged 1st page at [phone]. No answer. I just medicated pt with prn dilaudid 1 mg for severe pain. Pt still wants to talk to a doctor re: her pain management due to pain has not been relieved at all. Please evaluate. Thanks

## 2012-04-23 NOTE — Interdisciplinary (Signed)
Dr. Leone Brand with call back at 2033. No order change in pain med although gave telephone order for additional x1 dilaudid 1 mg and benadryl 25 mg iv x1 for sleep per pt request. After telephone orders were entered; noted new order from Dr. Britt Bottom to dc dilaudid and change to morphine 4 mg IV Q2 hrs prn severe pain. Pt updated re: changes in pain regimen.

## 2012-04-23 NOTE — Interdisciplinary (Signed)
Text paged [facility] per pt request. Dr. Hetty Ely called back & stated that they are going to stop by & see her.

## 2012-04-23 NOTE — Interdisciplinary (Signed)
Pt Alexandria Proctor  Pt is with severe abd pain 10/10. Please order pain med. Or come here and see pt. Thanks.

## 2012-04-23 NOTE — Interdisciplinary (Signed)
SW case finding:    Pt is a 56 year old female admitted with history of gastric bypass surgery with 5 day history of  abdominal pain with no reportable nausea or vomiting. Pt confirms her address on the face sheet. She confirms her emergency contact is her daughter listed and she resides at the address with her daughter. Pt then became extremely paranoid and guarded with my questions and refused to talk with me without me letting her see what I was witting down. She excused me when a physician came into the room. I had already been informed by nursing staff that she is paranoid and not cooperative with various staff. I left the pt with the physician.    I called pts listed daughter, Alexandria Proctor on face sheet, for needed collateral information given teh tp is not cooperating with staff. The person who picked up the telephone stated her name was not the same at the listed name of this pts daughter; however then she said "is this about Alexandria Proctor?" I stated that if  she is not the person I was asking for, Alexandria Proctor, then I could not answer any of her questions.     I returned to the pts room, She then stated that "its all about presentation" and now realizes I am trying to help her. She was then willing to talk with me and answer my generic questions of this assessment:  She states she has no health information at this time. Pt states she has been in [facility] for about 6 months from [place]. Pt states she had no health insurance back in [place] either. She reports she resides with her daughter Alexandria Proctor, and her 4 children two of which are twins born premature and have developmental delay.  She states she has a cane at home. She state she does not use etoh, she smokes about 6 cigarettes a day. She claims she does not use street drugs or marijuana . She says her daughter Alexandria Proctor would be her medical decision maker. Pt says she has "some" income and will not give me more information on that topic. I stated that the financial  counselor will be back and will have to ask a lot of specific financial questions to assist in getting her the needed medical information, and added it behooves her to cooperate so we can assist in getting her the proper funding so she will have health insurance to cover the hospitalization and possibly needed follow up in out pt settings. Pt state she will speak with financial counselor but again reiterates "It's all about how they present to me." I have relayed this information to financial counselor.      Left pt with list of current [facility] [employer] low income housing throughout [place] [employer]. Will also leave pt list of the [place] well. Also explained the information in the homeless resource packet as well.   Salient to note this pt tells me she has no hx of mental illness and no depression and state she has no thoughts of hurting herself or anyone else.

## 2012-04-23 NOTE — Plan of Care (Signed)
Problem: Discharge Planning  Goal: Participation in care planning  Outcome: Not Met  Maintained on NPO. Pain control with IV dilaudid. Will monitor.     Problem: Thromboemboli, Risk of  Goal: Absence of signs and symptoms of Thromboemboli  Outcome: Not Met  No S&S of DVT noted. Bilateral pedal pulses present.     Problem: Falls - Risk of  Goal: Absence of falls  Outcome: Not Met  On moderate fall risk.  free from fall/injury for this shift. Fall identifiers in place. Call light with in reach.  Advised to call for assistance. Fall prevention measure in place. Bed alarm in use. Hourly rounding done with regular occular safety checks done as well. So far, Compliant with fall prevention measures.          Problem: Pain - Acute  Goal: Control of acute pain  Outcome: Not Met  Continue to complain of pain. Request for dilaudid every 2 hours on the do. Requested for more pain med through the night but MD did not order any more narcotics. Tylenol IV added to her pain regimen.     Problem: Nausea/Vomiting  Goal: Absence of nausea  Outcome: Not Met  Still with nausea. No vomiting noted.     Problem: Coping - Ineffective, Patient and Family  Goal: Effective or improved coping  Outcome: Not Met  Pt very suspecting of all procedure done to her even with pain medication. Would examine every syringe, tubing and will not believe that IV line is working unless it is flushed and she taste saline while line being flushed. Every procedure explained step by step. Pt allowed to express her concerns. Talked to supervisor at one point as she was getting anxious that she is not given the right medication.

## 2012-04-23 NOTE — Plan of Care (Signed)
Problem: Discharge Planning  Goal: Participation in care planning  The following text page was sent to first call MD to pager number [phone]: "[id number]:  Pt. asking to speak with MD about the reason for the change in anitbiotics - also pt. concerned about taking KCl PO as pt. NPO and already has stomach upset. Please advise. Vinnie Langton [phone]"

## 2012-04-23 NOTE — Plan of Care (Signed)
Problem: Discharge Planning  Goal: Participation in care planning  Outcome: Not Met  Pt. not always cooperative with cares nor communicative with nursing staff in a productive manner. Pt. advised of POC issues and goals at morning assessment and with cares, as appropriate. Extra time spent with pt. to ensure pt. had an opportunity to express any concerns with nursing staff. Extra time and effort also spent with pt. in describing cares and medications. Pt. very attentive to and questioning of cares and especially the administration of medications - especially IV medications - pt. will question and challenge every action/move by the RN, especially with the administration of IV pain medication. Pt. will ask why the IV reflux valve or IV line ports are being cleaned with alcohol swabs and flushed with NS prior to the medications being injected/infused into pt's IV or IV line - pt. even questions why the cap is taken off of the vial holding the medication (so that the medication vial can be connected to reflux valve of IV access so that the medication can be administered - then pt. will ask why the medication is being pushed so slow (dilaudid 1 mg dose) and the IV line is then flushed with NS IV fluid after the medication is being given. Pt. will also question if the right medication is being administered when phenergan and antibiotic secondary lines and medication 'piggyback' bags (antibiotics, phenergan, tylenol) are being spiked and hung. Pt. also challenging if the bags even contain medication or is infusing as programmed. Much time is spent explaining and showing every process as it is being done and the pt. is shown the medications infusing but pt. becomes angry or frustrated when an attempt is being made to explain what she is asking about and pt. asks to speak with the manager or house supervisor. Resource RN, Consulting civil engineer, and Facilities manager kept advised of issues and asked for assistance when needed. All other POC  issues and goals are attempted to being addressed in a timely manner. Environment kept as pleasant and quiet as possible for pt. No discharge orders or instructions have been written for pt. at this time.    Problem: Thromboemboli, Risk of  Goal: Absence of signs and symptoms of Thromboemboli  Outcome: Met  No signs or symptoms of thromboemboli reported or observed this shift. Pt. receiving preventative measures and medications as ordered. Pt. advised to report any signs or symptoms of thromboemboli to nursing staff immediately.    Problem: Falls - Risk of  Goal: Absence of falls  Outcome: Met  No injury/adverse event to report this shift. Pt. advised of fall precautions for moderate fall risk at morning assessment and with cares. Pt. advised to call for assistance prior to repositioning or attempting to get OOB. Pt. monitored per protocol.    Problem: Pain - Acute  Goal: Control of acute pain  Outcome: Not Met  Pt. advised of pain medications and interventions available for pain control at morning assessment and with cares as needed. Pt. strongly advised to report pain or discomfort to nursing staff when experienced. Pt. reports high level of pain this shift when requesting pain medication. Pt. does exhibit no relief or report no relief from pain on reassessment. MD paged about pt's pain issues this morning - see note containing text page herein - no new orders written. Pain assessments and reassessments also charted in a timely manner for review by medical team. Continuing to monitor pt. frequently. See MAR and reassessments herein.  Problem: Nausea/Vomiting  Goal: Absence of nausea  Outcome: Not Met  Pt. assessed and monitored frequently for nausea and stomach upset. Pt. advised to report any nausea or stomach upset to nursing staff immediately. Pt. reported nausea to nursing staff twice this shift and phenergan IV was given as ordered. No emesis this shift.    Problem: Coping - Ineffective, Patient and  Family  Goal: Effective or improved coping  Outcome: Not Met  See notes herein about pt's communication with nursing staff in note for participation in care planning.

## 2012-04-23 NOTE — Plan of Care (Signed)
Problem: Discharge Planning  Goal: Participation in care planning  The following text page was sent to first call MD to pager number [phone]: "[id number]: Pt. refusing PO KCl. Vinnie Langton [phone]

## 2012-04-24 LAB — HEMOGRAM, BLOOD
Hct: 32.8 % — ABNORMAL LOW (ref 34.0–45.0)
Hgb: 10.9 gm/dL — ABNORMAL LOW (ref 11.2–15.7)
MCH: 27.7 pg (ref 26.0–32.0)
MCHC: 33.2 % (ref 32.0–36.0)
MCV: 83.2 um3 (ref 79.0–95.0)
MPV: 9.3 fL — ABNORMAL LOW (ref 9.4–12.4)
Plt Count: 265 10*3/uL (ref 140–370)
RBC: 3.94 10*6/uL (ref 3.90–5.20)
RDW: 16 % — ABNORMAL HIGH (ref 12.0–14.0)
WBC: 6 10*3/uL (ref 4.0–10.0)

## 2012-04-24 LAB — GLUCOSE POCT, BLOOD: Meter Glucose (POC): 156 mg/dL — ABNORMAL HIGH (ref 70–115)

## 2012-04-24 MED ORDER — LORAZEPAM 1 MG OR TABS
1.00 mg | ORAL_TABLET | Freq: Once | ORAL | Status: AC
Start: 2012-04-24 — End: 2012-04-24
  Administered 2012-04-24: 1 mg via ORAL
  Filled 2012-04-24: qty 1

## 2012-04-24 MED ORDER — NALOXONE HCL 0.4 MG/ML IJ SOLN
0.1000 mg | INTRAMUSCULAR | Status: DC | PRN
Start: 2012-04-24 — End: 2012-04-25

## 2012-04-24 MED ORDER — HYDROMORPHONE HCL 1 MG/ML IJ SOLN
1.0000 mg | INTRAMUSCULAR | Status: DC | PRN
Start: 2012-04-24 — End: 2012-04-24
  Administered 2012-04-24 (×11): 1 mg via INTRAVENOUS
  Filled 2012-04-24 (×11): qty 1

## 2012-04-24 MED ORDER — ENOXAPARIN SODIUM 30 MG/0.3ML SC SOLN
30.0000 mg | Freq: Two times a day (BID) | SUBCUTANEOUS | Status: DC
Start: 2012-04-24 — End: 2012-04-26
  Administered 2012-04-24 – 2012-04-26 (×4): 30 mg via SUBCUTANEOUS
  Filled 2012-04-24 (×5): qty 0.3

## 2012-04-24 MED ORDER — MENTHOL 3 MG MT LOZG
1.0000 | LOZENGE | OROMUCOSAL | Status: DC | PRN
Start: 2012-04-24 — End: 2012-04-26

## 2012-04-24 MED ORDER — HYDROMORPHONE HCL 2 MG/ML IJ SOLN
1.5000 mg | INTRAMUSCULAR | Status: DC | PRN
Start: 2012-04-24 — End: 2012-04-24

## 2012-04-24 MED ORDER — HYDROMORPHONE HCL 1 MG/ML IJ SOLN
1.0000 mg | INTRAMUSCULAR | Status: DC | PRN
Start: 2012-04-24 — End: 2012-04-24
  Administered 2012-04-24: 1 mg via INTRAVENOUS
  Filled 2012-04-24: qty 1

## 2012-04-24 MED ORDER — HYDROMORPHONE PCA 0.2 MG/ML SYRINGE
INTRAMUSCULAR | Status: DC
Start: 2012-04-24 — End: 2012-04-25
  Administered 2012-04-24: 0.2 mg via INTRAVENOUS
  Filled 2012-04-24 (×2): qty 50

## 2012-04-24 MED ORDER — HYDROMORPHONE HCL 2 MG/ML IJ SOLN
2.0000 mg | INTRAMUSCULAR | Status: DC | PRN
Start: 2012-04-24 — End: 2012-04-24
  Administered 2012-04-24: 2 mg via INTRAVENOUS
  Filled 2012-04-24: qty 1

## 2012-04-24 NOTE — Plan of Care (Signed)
Problem: Discharge Planning  Goal: Participation in care planning  Outcome: Not Met  Pt's primary concern is pain control. Per resident, Dr. Janann August, will arrange for pain consult in am.     Problem: Thromboemboli, Risk of  Goal: Absence of signs and symptoms of Thromboemboli  Outcome: Met  Assessed s/sx of DVT. Pt encouraged to do ROM, dorsiplantar flexion exercises. Pt denies pain on calf, peripheral pulses palpable with good cap refill to lower extremities. Pt is ambulatory. Refused venodynes.    Problem: Falls - Risk of  Goal: Absence of falls  Outcome: Met  Assessed and monitored for patient safety, bedrails up x2 while in bed, bed locked and in low position, call light and belongings within reach, hourly rounding. Pt is a moderate risk for falls.     Problem: Pain - Acute  Goal: Control of acute pain  Outcome: Not Met  Dr. Allena Earing saw pt twice tonight and made changes to receive dilaudid 1 mg iv Q1hr prn severe pain. At the start of the shift, pain has been "10+++" severe abd pain per pt report. Pain now down to 9-10 with current regimen.         Problem: Nausea/Vomiting  Goal: Absence of nausea  Outcome: Not Met  Received x1 prn phenergan 12.5mg  iv for nausea, no vomiting seen. Noted pt with starburst candy per pt, " i need something to suck on to get rid of this nasty taste in my mouth." Emphasized to pt to follow npo status. Encouraged pt to brush teeth.     Problem: Coping - Ineffective, Patient and Family  Goal: Effective or improved coping  Outcome: Not Met  Pt tearful and anxious due to pain at the first half of the shift until pain regimen was changed to q1hrly prn. Pain consult to be done 04/24/12. Provide emotional supprt prn.

## 2012-04-24 NOTE — Interdisciplinary (Signed)
AD\[facility] is sending a message to: [facility] / [phone]    Pt Alexandria Proctor  Pt still with 10/10 abd pain despite dilaudid 1 mg iv 50 minutes ago. She is requesting something for breakthrough even though i've educated her about her regimen. She insists on having the house supervisor call you. Please respond. Thanks

## 2012-04-24 NOTE — Plan of Care (Signed)
When arrived to floor pt upset and requesting to speak with nursing supervisor stating pain 10/10, Charge spoke with pt and page md regarding current pain regimen. Md increased dilaudid pca from 0.2mg  q40min to 0.3mg , pca dose changed accordingly. Pt continuously asking to page md for other pain meds, md paged and did assess pt. On assessment pt stated she felt better, once MD left pt once again requesting a 1x extra dose of dilaudid. Reinforced and reeducated pt on pca dosing and time but continues to request I page md. Dr. Text paged, awaiting response. Pt appears to be resting comfortably at this time.

## 2012-04-24 NOTE — Interdisciplinary (Signed)
AD\[facility] is sending a message to: [facility] / [phone]    Pt russo, Alexandria Proctor    c/o insomnia. received benadryl 25 mg ivp x1 at 2300. Wants to try ambien. Pt still cant sleep and in pain. I just medicated her with dilaudid 1 mg iv. Pt is also requesting for pain med for breakthrough. Thanks.

## 2012-04-24 NOTE — Plan of Care (Signed)
Problem: Coping - Ineffective, Patient and Family  Goal: Effective or improved coping  Report given to RN staff on 8th floor.  I called transportation services to for transport for abd xray and I encouraged pt to allow me to administer a dose of dilaudid as she claims to have abd pain and to allow her to be medicated for her transportation off unit so that she also isnt delayed in medication on her request for pain.  Pt refused and states she rather be medicated at her destination in 8th floor.  Pt seemed to be calm and trusting our care, yet after review to pt that she will be transferring to 8th floor, she seemed to become emotionally labile and accusing staff that we are refusing to medicate her for pain.  I called primary RN Luciano Cutter on 8th floor and reported pt's exhibited behavior.  Pt finally allowed [place] to transport her to xray and is now off unit.

## 2012-04-24 NOTE — Plan of Care (Signed)
Problem: Falls - Risk of  Goal: Absence of falls  Patient assessed as moderate fall risk.    moderate fall education provided and pt able to teach back our discussion.    Pt verbalizes indifference r/t our discussion r/t fall education.   Bed at lowest position.  2/4 bed rails raised.      Pt instructed to notify staff for assistance via call light and that we will continue hourly rounds per protocol.  Bedside table and call light within reach.  Pt wearing non-skid socks.  Goal met, pt remains free of injury 2/2 falls.          Problem: Pain - Acute  Goal: Control of acute pain  Outcome: Not Met  Reviewed pain medication regimen r/t dilaudid.  Offered distraction activities such as reading magazines and 1 on 1 discussion emotional/supportive care as pt will tolerate.  Pt states diffuse, stabbing pain to R abd.  abd observed distended.  Reports no flatus.  Medicated with dilaudid as ordered.  Reviewed the will have pain specialists visit her today as well.  Pt verbalized feelings of indifference to our discussion this morning.  Will revisit pt hourly and evaluate her pain levels with doses of dilaudid.  See assessment.    Problem: Nausea/Vomiting  Goal: Absence of nausea  Outcome: Met  Reviewed phenergan regimen for management of her nausea.  Pt denied nausea this morning.  She tells me she understands her NPO status and admits to consuming candy very early this morning and that her doctor encouraged her to remain NPO.  Pt states she is NPO at this time and currently endorses no nausea.      Problem: Coping - Ineffective, Patient and Family  Goal: Effective or improved coping  Outcome: Not Met  Pt observed to perseverate about her pain medications.  She also is noted to want to inspect my syringe of dilaudid before administration.  She tells me, "..i'm not paranoid, i just dont trust if you guys are giving me the right dose..."   She monitors every motion I make including how i scan her medications and puts her R  triple lumen IJ directly near her face as I administered her dilaudid this morning.  I kept open dialogue with pt and explained each action I did including when Im removing the cap and replacing caps and infusion of drug.  Pt seemed to trust me afterwards and allowed me to administer drug after careful inspection of syringe, drug, and IJ ports.   Pt observed to have linear conversations with me, yet observed disorganized and unkept with her belongings.  Noted slightly bizarre make up this morning.  Pt is not as intrusive as reported at nursing station looking for me and/or asking if anyone is looking for her.

## 2012-04-24 NOTE — Plan of Care (Signed)
Pt requesting to have staff to go downstairs with pt to smoke, informed pt no staff available at this time and encouraged to wait for assistance. Pt refused and went downstairs on own.

## 2012-04-24 NOTE — Interdisciplinary (Addendum)
Pt has been getting Dilaudid 1 mg every hour on the hour.  Changed to 2 mg every 2 hours.  Abdominal pain has remained unchange.   She is up and about.   Unable to assess bowel sounds.  Pt stated that I press the stethoscope to hard against her stomach, though the stethoscope has only touched her gown.    Patient was seen by Pain Service.

## 2012-04-24 NOTE — Interdisciplinary (Signed)
Dr. Allena Earing with callback; will change frequency of dilaudid to Q1hr prn.

## 2012-04-24 NOTE — Interdisciplinary (Signed)
Pt was transferred here from [address].  Oriented pt to room and environment.  She had her abd Xray already.  Medicated with Dilaudid 1mg  IV.  Showed pt the vial and how it is drawn up into the syringe and pushing it through the iv line. Flushed with 10 mL with Normal Saline.  Patient asked for Tylenol IV and Phenergan IV.

## 2012-04-24 NOTE — Interdisciplinary (Signed)
Dr. Allena Earing, J was notified of pt's c/o of severe abdominal pain. Pt states that Morphine IV didn't help her pain at all and she's almost ready to scream. Dr. Allena Earing came to see pt. Stat hemogram collected , send to lab, VS taken and MD was notified of elevated BP.

## 2012-04-24 NOTE — Interdisciplinary (Signed)
Endorsed to noc rn for care and to discuss with MD regarding pain management.  She reports that she has no relief from the PCA Dilaudid.

## 2012-04-24 NOTE — Interdisciplinary (Signed)
Bedside handoff with Alinda Money, RN. Pt medicated with dilaudid 1 mg IV at 0711. Pt now voicing concern that she is not getting all the medication from the carpuject syringe; stated "I don't feel the medication working." Expressing concern as to why this writer has to go behind the curtain to access computer; curtain pulled back so pt can see the computer; why the iv access need to be disconnected from the tubing; has a line that is heplocked but insists on having to use a line that has the continuous infusing running. Per pain flowsheet, pain always a 10 during assessment & reassessment, despite getting med Q1 hr. Pt now adamant on getting a PCA; as was relayed to Dr. Janann August & Dr. Britt Bottom when they made rounds at around 0630. Pt stated "I better get the PCA or I will be calling for the house supervisor or manager all day long." Pain consult to be ordered by MD.

## 2012-04-24 NOTE — Progress Notes (Addendum)
Paged to bedside by RN due to pt c/o 10/10 abdominal pain. Pt afebrile, hemodynamically stable. On exam tender to palpation only to the RLQ. No rebound tenderness. Some guarding. Soft, moderately distended which she reports has been going on x6 days. Passing flatus. No belching. Had BM this AM which she reports being black and foul smelling. Open Starburst and M&Ms on table next to pt's bed. Counseled on the importance of strict NPO in the setting of recent small bowel obstruction.    -Will repeat vital signs.  -Stat CBC.  -Restart dilaudid for pain control.  -Strict NPO    Aleene Davidson PGY1      Addendum  1:06 - Paged by RN. Pt still c/o severe pain. Will increase frequency of dilaudid to q1hr.    Aleene Davidson PGY1      Addendum  2:35 AM - Micah Flesher to re-evaluate pt. Upon walking into room pt was up bedside the bed bending over looking through her purse and pulled out her hairbrush. Reports that pain is now tolerable. Candy still at bedside. Reminded importance of strict NPO. Pt verbalized understanding.    Aleene Davidson PGY1

## 2012-04-24 NOTE — Progress Notes (Signed)
Dictating Practitioner:  Farrel Conners, M.D.    [facility]    Date of Note:  04/22/2012    This is a 56 year old female admitted yesterday with abdominal pain, nausea  and vomiting for 5 days. She has a history of a gastric bypass. She also  reported some black stools at home but rectal exam and stool guiac here was  negative for any blood. CT scan of the abdomen and pelvis demonstrated a  possible tiny amount of fat stranding along the right colon and cecum but  no other abnormalities. Her labs have been normal. She relates no history  of marginal ulcers and she continues to have bowel function. She is  afebrile and hemodynamically stable. Lactate level is normal. She is on a  PPI.    Today we will contact GI for an EGD to evaluate her for marginal ulcers. We  will continue to perform serial abdominal exams and we will contact the  [facility] Service regarding potential transfer given her  history of gastric bypass.        Electronically signed by:  Farrel Conners, M.D. 04/24/2012 02:05 P          DD: 04/22/2012 TD: 12:00 P   DT:  04/24/2012 11:59 A   DocNo.:  [id number]  RSC/srf    cc:

## 2012-04-24 NOTE — Progress Notes (Signed)
Progress Note  White/Trauma    Patient Name: Alexandria Proctor  MRN: [id number]  Room#: 540/981X    Hospital Day:   2 days - Admitted on: 04/22/2012 HD#: 2  Service: Surgery, White    56 year old female admitted with history of gastric bypass surgery w/5d history of intense abdominal pain with n/v.    SUBJECTIVE:  Patient still with complaints of 10/10 abdominal pain unrelieved by dilaudid. Dosing frequency of dilaudid increased with minimal relief in abdominal pain. Abdominal exam o/n reassuring. NPO although patient was seen eating candy.    OBJECTIVE:  Vital Signs:     Latest Entry  Range (last 24 hours)    Temperature: 97.8 F (36.6 C)  Temp  Avg: 98.1 F (36.7 C)  Min: 97.8 F (36.6 C)  Max: 98.4 F (36.9 C)    Blood pressure (BP): 156/84 mmHg  BP  Min: 139/107  Max: 179/100    Heart Rate: 81   Pulse  Avg: 72.7   Min: 55   Max: 104     Respirations: 20   Resp  Avg: 19   Min: 18   Max: 20     SpO2: 96 %  SpO2  Avg: 97.5 %  Min: 96 %  Max: 99 %     Weight - scale: 85 kg (187 lb 6.3 oz)  Percentage Weight Change (%): 3.53 %    08/02 0600 - 08/03 0559  In: 1481 [P.O.:25; I.V.:1456]  Out: 1000 [Urine:1000]  Bowel movement: x1 (black and foul-smelling per patient)    Physical:    Gen: nontoxic appearing, sitting at edge of bed  Abd: soft, obese, diffusely tender to palpation with more tenderness in RUQ, no rebound tenderness, no guarding    Labs:     CBC  Recent Labs      04/22/12   0555  04/23/12   0920  04/24/12   0050   WBC  5.8  4.8  6.0   HGB  11.3  11.1*  10.9*   HCT  34.1  32.9*  32.8*   PLT  299  282  265   SEG  62  51   --    LYMPHS  25  32   --    MONOS  9  10   --         Chemistry  Recent Labs      04/22/12   0500  04/23/12   0920   NA  145  141   K  3.4*  3.6   CL  111*  106   BICARB  26  27   BUN  10  6   CREAT  0.83  0.80   GLU  71  63*   [place]  8.7  9.1     Recent Labs      04/21/12   2114  04/22/12   0500  04/23/12   0920   ALK  77  61  60   AST  27  15  18    ALT  19  15  15    TBILI  0.2  0.1  0.2   DBILI    --   <0.1   --    ALB  4.0  3.2*  3.5          Coags  Recent Labs      04/21/12   2114  04/22/12   0500   PT  10.6  10.9   PTT  33.3  29.8   INR  1.0  1.1       Toxicology:  No components found with this basename: ETOHPLASMA,  AMPHETCL,  BARBCLASS,  BENZDIAZCL,  BENZYLCGNN,  METHADONE,  OPIATESCL,  OXY,  PHENCYCLDN,  PROPOXYPHN,  TETCANNABIN       Radiology:   CT Scan (on admission)  IMPRESSION: CT scan with IV contrast    1. No evidence of obstruction and no fluid collections visualized in the   abdomen or pelvis. No acute findings to account for patient's abdominal pain.    2. 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    3. 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 Right renal cyst measuring 1.1   x 1 cm.    5. Grade 1 anterolisthesis of L4 on L5.    CT Scan (08/01 @ 20:00)  Prelim read "inflammation of ascending colon"  IMPRESSION: CT scan with IV contrast    1. Dilation of a segment of ileum in the deep pelvis with adjacent focal   narrowing suggesting an adhesion. Appearance is new from prior exam, note   that oral contrast is seen passing through the small bowel into the cecum.   Findings may represent a partial obstruction - recommend continued clinical   follow-up.     2. Other findings are unchanged from prior exam. Please see report from CT   abdomen and pelvis 04/22/2012 at 1:01 a.m.          Medications:  Scheduled Meds       . ciprofloxacin (CIPRO) IVPB  400 mg Q12H   . diphenhydrAMINE  25 mg Once   . docusate sodium  250 mg HS   . lansoprazole  30 mg BID AC   . metroNIDAZOLE  500 mg Q8H   . DISCONTD: piperacillin-tazobactam (ZOSYN) IVPB  3,375 mg Q8H   . potassium chloride  20 mEq Once   . sodium chloride (PF)  3 mL Q8H     PRN Meds       . acetaminophen  1,000 mg Q6H PRN   . acetaminophen  650 mg Q4H PRN   . DISCONTD: HYDROmorphone  0.5 mg Q2H PRN   . DISCONTD:  HYDROmorphone  1 mg Q2H PRN   . HYDROmorphone  1 mg Q1H PRN   . HYDROmorphone  1 mg Once PRN   . DISCONTD: HYDROmorphone  1 mg Q2H PRN   . DISCONTD: morphine  4 mg Q2H PRN   . promethazine (PHENERGAN) IVPB  12.5 mg Q6H PRN   . sodium chloride (PF)  3 mL PRN   . sodium chloride   Continuous PRN       ASSESSMENT / PLAN:    56 year old female admitted w/history of gastric bypass now with abdominal pain otherwise normal imaging and labs. Reread on CT scan c/f partial SBO. Patient continues to have bowel movement and pass gas. Continue with broad spectrum conservative management of abdominal pain.     Neuro/Pain: pain control with IV dilaudid, patient unsatisfied with current regimen, will c/s Pain Management  CV: hemodynamically stable, no issues  FEN/GI: continue NPO, NS@100 , replete electrolytes prn, serial abdominal exams, stool cx and C.diff pending, KUB this AM  Psych: per nursing, patient emotionally labile and paranoid this AM; psych c/s requested  Patient new to area, outside records have been  requested -- say there is no patient with that name that has been at that hospital.    Patient seen and discussed with senior on call.    Tomasa Rand  General Surgery, PGY1  [phone]

## 2012-04-24 NOTE — Consults (Signed)
Inpatient Pain Service Consult Note      Referring MD: Fanny Bien, MD  Referring Department: Surgery  Primary Care Physician No Pcp, Per Patient    History of Present Illness:     Alexandria Proctor is a 56 year old female who was admitted on 04/22/2012 for acute onset abdominal pain and nausea. Their PMH is significant for gastric bypass 5 years ago in [place]. She moved to [facility] 9 months ago. Per patient, she only took tylenol and prevacid prior to admission. GI workup this admission has essentially been negative. Per nursing staff, the patient has been quite labile and emotional when it comes to the administration of her pain medicine. There is some question about pseudoaddiction vs psychiatric component to the patients pain and a psychiatric consult is pending.     The primary service has requested a pain consult to assist with acute abdominal pain control. The patient currently reports a pain score of 10/10 and describes the pain as intermittent pressure, aching and squeezing. It is located diffusely across the abdomen.     Home Pain Medications:  Analgesics prior to admission patient: tylenol    Prescriptions prior to admission   Medication Sig Dispense Refill   . Omeprazole (PRILOSEC PO)          No prescriptions prior to admission        Current Inpatient Pain Medications:  Oral/Transdermal Opioids Administered: none  IV Opioids Administered: Dilaudid 2 mg IV q 2 prn (recently increased from 1 mg Q1H prn; 18 mg in last 24 hours)  PCA Usage: none  Other Analgesic/Anxiolytics: tylenol IV    Allergies   Allergen Reactions   . Compazine Anaphylaxis     Per patient tolerates promethazine/Phenergan   . Reglan (Metoclopramide) Anaphylaxis   . Toradol Anaphylaxis   . Tramadol Anaphylaxis     Per patient, tolerates hydromorphone (Dilaudid) and morphine    . Zofran (Ondansetron) Anaphylaxis     Past Medical History   Diagnosis Date   . Obesity      Past Surgical History   Procedure Laterality Date   . Gastric bypass      . Ventral hernia repair       History     Social History   . Marital Status: Single     Spouse Name: N/A     Number of Children: N/A   . Years of Education: N/A     Occupational History   . unemployed      Social History Main Topics   . Smoking status: Never Smoker    . Smokeless tobacco: Never Used   . Alcohol Use: No   . Drug Use: No   . Sexually Active: Not Currently     Other Topics Concern   . Not on file     Social History Narrative   . 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    Review of Systems:   General: Fatigue  Cardiovascular: Negative  Gastrointestinal: Negative  Genito/Reproductive: Negative  Endocrine: Negative  Psychiatry: Negative  EENT: Negative  Respiratory: Negative  Urinary: Negative  Musculoskeletal: Negative  Skin: Negative  Neurological: Negative    Physical Exam:  Vitals: BP 122/79  Pulse 54  Temp(Src) 98 F (36.7 C)  Resp 18  Ht 5\' 6"  (1.676 m)  Wt 85 kg (187  lb 6.3 oz)  BMI 30.26 kg/m2  SpO2 100%  General: alert, cooperative  Mental Status:  awake, oriented x 3. Speech is clear/ normal  Affect: euthymic  Pupils: non pinpoint  Neuro: normal without focal findings  Respiratory: breathes easy without bradypnea or tachypnea  Abdomen: soft, non-tender. Bowel sounds normal. No masses, no organomegaly  Skin:  intact, no lesions     Assessment and Plan:  This is a 56 year old female admitted for acute onset abdominal pain and nausea. Their PMH is significant for gastric bypass 5 years ago in [place]. Work up has been negative. The patient's pain control is currently fair. She has been receiving prn dilaudid on the hour, every hour.     Pain Recommendations:   1. Agree with Psych consult to address psychosomatic component to pain complaint  2. Start Diludid PCA 0.2 mg Q 10 min; NO BASAL. The PCA at this interval will allow for more steady blood levels of drug,  allow for better assessment of patients true opiate needs, and increase patients satisfaction and sense of control.  Explained to patient that this will be less drug at a time on a more frequent basis. She agreed with plan and all questions were answered. Once patient is taking po AND physical findings correlate to pain consumption, can d/c PCA and place on equianalgesic oral pain medication.   3. Continue scheduled tylenol      Thank you for this consult. Please contact the pain fellow on-call for questions.  Note Author: Ronnald Nian

## 2012-04-24 NOTE — Interdisciplinary (Signed)
Dr. Allena Earing visited pt at 0233. Pt states she feels better that pain med is now Q1hr prn.

## 2012-04-24 NOTE — Interdisciplinary (Addendum)
We started pt on Dilaudid PCA 0.2 mg every 10 minutes.  It has not been 5 minutes out, she has already called and stating that it is not working.      She is up and about.  She does not want the venodyns.

## 2012-04-25 LAB — MAGNESIUM, BLOOD: Magnesium: 1.8 mg/dL (ref 1.7–2.6)

## 2012-04-25 LAB — CBC WITH DIFF, BLOOD
ANC-Automated: 3.9 10*3/uL (ref 1.6–7.0)
ANC-Instrument: 3.9 10*3/uL (ref 1.6–7.0)
Abs Eosinophils: 0.5 10*3/uL (ref 0.0–0.5)
Abs Lymphs: 1.2 10*3/uL (ref 0.8–3.1)
Abs Monos: 0.5 10*3/uL (ref 0.2–0.8)
Basophils: 1 % (ref 0–2)
Eosinophils: 8 % — ABNORMAL HIGH (ref 1–7)
Hct: 32.2 % — ABNORMAL LOW (ref 34.0–45.0)
Hgb: 10.6 gm/dL — ABNORMAL LOW (ref 11.2–15.7)
Lymphocytes: 20 % (ref 19–53)
MCH: 27.5 pg (ref 26.0–32.0)
MCHC: 32.9 % (ref 32.0–36.0)
MCV: 83.6 um3 (ref 79.0–95.0)
MPV: 9 fL — ABNORMAL LOW (ref 9.4–12.4)
Monocytes: 8 % (ref 5–12)
Plt Count: 264 10*3/uL (ref 140–370)
RBC: 3.85 10*6/uL — ABNORMAL LOW (ref 3.90–5.20)
RDW: 16 % — ABNORMAL HIGH (ref 12.0–14.0)
Segs: 63 % (ref 34–71)
WBC: 6.1 10*3/uL (ref 4.0–10.0)

## 2012-04-25 LAB — BASIC METABOLIC PANEL, BLOOD
BUN: 9 mg/dL (ref 6–20)
Bicarbonate: 27 mmol/L (ref 22–29)
Calcium: 8.9 mg/dL (ref 8.6–10.5)
Chloride: 106 mmol/L (ref 98–107)
Creatinine: 0.83 mg/dL (ref 0.51–0.95)
GFR: >60 mL/min
Glucose: 52 mg/dL — CL (ref 70–115)
Potassium: 3.4 mmol/L — ABNORMAL LOW (ref 3.5–5.1)
Sodium: 142 mmol/L (ref 136–145)

## 2012-04-25 LAB — PHOSPHORUS, BLOOD: Phosphorous: 3.9 mg/dL (ref 2.7–4.5)

## 2012-04-25 MED ORDER — DEXTROSE 50 % IV SOLN
12.5000 g | INTRAVENOUS | Status: DC | PRN
Start: 2012-04-25 — End: 2012-04-26

## 2012-04-25 MED ORDER — GLUCOSE 4 GM PO CHEW (CUSTOM)
4.0000 | CHEWABLE_TABLET | ORAL | Status: DC | PRN
Start: 2012-04-25 — End: 2012-04-26
  Administered 2012-04-25: 16 g via ORAL
  Filled 2012-04-25 (×2): qty 4

## 2012-04-25 MED ORDER — ZOLPIDEM TARTRATE 5 MG OR TABS
5.00 mg | ORAL_TABLET | Freq: Every evening | ORAL | Status: AC | PRN
Start: 2012-04-25 — End: 2012-04-25
  Administered 2012-04-25: 5 mg via ORAL
  Filled 2012-04-25: qty 1

## 2012-04-25 MED ORDER — GLUCAGON HCL (RDNA) 1 MG IJ SOLR
1.0000 mg | Freq: Once | INTRAMUSCULAR | Status: DC | PRN
Start: 2012-04-25 — End: 2012-04-26

## 2012-04-25 MED ORDER — POTASSIUM CHLORIDE 10 MEQ/100ML IV SOLN
10.00 meq | INTRAVENOUS | Status: AC
Start: 2012-04-25 — End: 2012-04-25
  Administered 2012-04-25 (×3): 10 meq via INTRAVENOUS
  Filled 2012-04-25 (×3): qty 100

## 2012-04-25 MED ORDER — HYDROMORPHONE HCL 1 MG/ML IJ SOLN
0.5000 mg | INTRAMUSCULAR | Status: DC | PRN
Start: 2012-04-25 — End: 2012-04-26
  Administered 2012-04-25 – 2012-04-26 (×2): 0.5 mg via INTRAVENOUS
  Filled 2012-04-25 (×2): qty 1

## 2012-04-25 MED ORDER — DEXTROSE (DIABETIC USE) 40 % OR GEL
1.0000 | ORAL | Status: DC | PRN
Start: 2012-04-25 — End: 2012-04-26
  Filled 2012-04-25 (×2): qty 38

## 2012-04-25 MED ORDER — DEXTROSE-KCL-NACL 5-20-0.45 %-MEQ/L-% IV SOLN
INTRAVENOUS | Status: DC
Start: 2012-04-25 — End: 2012-04-26

## 2012-04-25 MED ORDER — HYDROMORPHONE HCL 2 MG OR TABS
2.0000 mg | ORAL_TABLET | Freq: Three times a day (TID) | ORAL | Status: DC | PRN
Start: 2012-04-25 — End: 2012-04-25

## 2012-04-25 MED ORDER — POTASSIUM CHLORIDE CRYS CR 10 MEQ OR TBCR
30.00 meq | EXTENDED_RELEASE_TABLET | ORAL | Status: AC
Start: 2012-04-25 — End: 2012-04-25
  Administered 2012-04-25 (×2): 30 meq via ORAL
  Filled 2012-04-25 (×2): qty 3

## 2012-04-25 MED ORDER — HYDROMORPHONE HCL 2 MG OR TABS
2.0000 mg | ORAL_TABLET | ORAL | Status: DC | PRN
Start: 2012-04-25 — End: 2012-04-26
  Administered 2012-04-25 – 2012-04-26 (×4): 2 mg via ORAL
  Filled 2012-04-25 (×6): qty 1

## 2012-04-25 MED ORDER — MAGNESIUM OXIDE 400 MG OR TABS
400.00 mg | ORAL_TABLET | Freq: Once | ORAL | Status: AC
Start: 2012-04-25 — End: 2012-04-26
  Filled 2012-04-25: qty 1

## 2012-04-25 NOTE — Interdisciplinary (Signed)
Spoke to pharmacist Bonita Quin regarding moving times for potassium chloride IVPB; she stated I could move the times to later in the afternoon by changing in EPIC myself.

## 2012-04-25 NOTE — Plan of Care (Signed)
Pt back on floor.

## 2012-04-25 NOTE — Consults (Signed)
Psychiatry Resident Consult Note    Date of Admission: 04/22/2012  Consulting Attending: Fanny Bien, MD  Reason for Consult: evaluate for underlying psychiatric problem     History of Present Illness:     Alexandria Proctor is a 56 year old female with history of gastric bypass surgery admitted to surgery service for abdominal pain, now with full work up but continued pain and surgery team would like to discharge patient but concern for underlying psychiatric issue.  Patient reports that she is frustrated because she knows something is wrong in her abdomen but the doctors can't figure out what it is. She denies depressive symptoms of poor energy, concentration, interest, feelings of gulit/worthlessness/hopelessness and denies SI. Does report difficulty sleeping which has worsened over the past week because of the abdominal pain and sleeping in the hospital. Is interested in getting help for insomnia. Discussed possible medications but that she would need to be followed by a psychiatrist or primary care physician for follow up with any of these prescribed medications. Patient with multiple allergies so only likes to take benadryl when she has "anaphylaxis."  Denies SI, HI and AVH. Denies paranoia.  Lives with adult daughter and her 2 twin sons who are 5yo. Patient would like to get her own place. Loves grandchildren. Speaks to daughter daily while she has been here. Very happy with her care, thinks that the staff has been very friendly.  Patient did not give permission to speak with her daughter. Screen for mania was negative. Patient denies anxiety symptoms besides some worry about what is going on with her abdominal pain. Patient is interested in getting set up with a PCP for general health care and psychiatrist if that can help her with sleep.      Past Psychiatric History:     1) Diagnoses: denies  2) Suicide attempts: denies   3) Inpatient Hospitalizations: 1x for 30 days in 1998 after her teenage son was killed in  [place] by gun violence, states "I almost lost it"   4) Outpatient treatment/psychiatrist: none   5) Medication Trials: not sure what they put her on during 30 day inpt stay but hasn't been on anything as an outpatient    Substance History:  Denies problems with drugs and alcohol. Denies current use.     Medical History:   Past Medical History   Diagnosis Date   . Obesity        Allergies:   Allergies   Allergen Reactions   . Compazine Anaphylaxis     Per patient tolerates promethazine/Phenergan   . Reglan (Metoclopramide) Anaphylaxis   . Toradol Anaphylaxis   . Tramadol Anaphylaxis     Per patient, tolerates hydromorphone (Dilaudid) and morphine    . Zofran (Ondansetron) Anaphylaxis       Medications:   Scheduled:       . ciprofloxacin (CIPRO) IVPB  400 mg Q12H   . docusate sodium  250 mg HS   . enoxaparin  30 mg Q12H   . lansoprazole  30 mg BID AC   . LORazepam  1 mg Once   . metroNIDAZOLE  500 mg Q8H   . potassium chloride  20 mEq Once   . sodium chloride (PF)  3 mL Q8H       PRN:       . acetaminophen  1,000 mg Q6H PRN   . acetaminophen  650 mg Q4H PRN   . DISCONTD: HYDROmorphone  1 mg Q1H PRN   .  DISCONTD: HYDROmorphone  1.5 mg Q1H PRN   . DISCONTD: HYDROmorphone  2 mg Q2H PRN   . menthol  1 lozenge Q2H PRN   . naloxone  0.1 mg Q2 Min PRN   . promethazine (PHENERGAN) IVPB  12.5 mg Q6H PRN   . sodium chloride (PF)  3 mL PRN   . sodium chloride   Continuous PRN       Social History:  Lives with adult daughter and her 2 twin sons who are 5yo. Loves grandchildren. Daughter currently supports patient, but patient has applied for financial assistance through SSI and SSDI. Patient moved from [place] to [facility] 3 yrs ago to be near her daugher. Additionally, states there are more educational opportunities here (was previously studying psychology at [employer]) and she wants to continue her education.    Family History:   denies                 MSE:   [place] female, wearing hospital gown, tubing on neck,  eyebrow makeup evident, hair uncombed, calm in bed, cooperative, polite, friendly, joking, smiling at times, no abnormal movements except occasional grimacing when abdomen is compressed, mood "frustrated," affect constricted but appropriately reactive, linear thought process. Thought content - denies SI, HI and AVH. No evidence of psychosis. Alert and oriented. I/J - fair/fair    Pertinent Studies/Labs:   Lab Results   Component Value Date    BUN 9 04/25/2012    CREAT 0.83 04/25/2012    CL 106 04/25/2012    NA 142 04/25/2012    K 3.4 04/25/2012    [place] 8.9 04/25/2012    TBILI 0.2 04/23/2012    ALB 3.5 04/23/2012    TP 5.7 04/23/2012    AST 18 04/23/2012    ALK 60 04/23/2012    BICARB 27 04/25/2012    ALT 15 04/23/2012    GLU 52 04/25/2012       Assessment:  Patient denies depressive and psychotic symptoms. No indication for psychiatric medication at this time. However, patient would like help with insomnia. Can consider melatonin, low dose doxepin, ambien or other sleep agents, but would recommend that patient have follow up with an outpatient PCP or psychiatrist to monitor and treat her for this. Patient denies problems with drugs and alcohol.  Unable to determine if patient's pain concerns have underlying somatoform or conversion etiologies at this time.     Comprehensive suicide risk assessment was performed and patient was assessed to be Low acute risk for harm.  Risk factors include difficulty with finances and housing but patient is working on improving these.  Protective factors include future oriented, wants to go back to school, has applied for financial help through SSI/SSDI etc, loves grandchildren, lives with daughter, has shown she can access care by coming to hospital and wants to improve health condition, denies SI, no history of suicide attempts.       Recommendations:  -Recommendations are preliminary.  Consult will be staffed with attending within 24 hours.  -patient would benefit from PCP for general medical care and possibly  psychiatrist to manage her insomnia (although PCP likely can do this). Can consider low dose doxepin, ambien or another sleep agent for insomnia, but she will need follow up for this.  -patient with episodes of hypoglycemia, may be affecting mood and behavior. Can consider further workup. Also with low K+.  -patient given handout for [facility] where she can go to set up county  insurance and get a PCP while she pursues SSI, MCAL etc  -gave patient handout for county clinic by zip code so that she can look for a psychiatrist as an outpatient if she is interested  -Thank you for this consult.  Please page [phone] with any questions.    PSYCHIATRY ATTENDING ADDENDUM:  Patient seen, chart reviewed, discussed with staff. Details as above.  56 years old AA female, s/p gastric bypass surgery presents with abdominal pain and diarrhea. No significant past psych history. No acute mood, psychosis or anxiety symptoms.  No indication for meds at this time.  Suggest to check TSH, B12, Folate and Vitamin D levels.  She was given resources for o/p if she decides to see someone.

## 2012-04-25 NOTE — Plan of Care (Signed)
Problem: Falls - Risk of  Goal: Absence of falls  Outcome: Met  x2 side rails up, bed in lowest and locked position, call light with in reach at all times and pt uses appropriately for assistance to BR. No falls or injuries noted at this time.     Problem: Pain - Acute  Goal: Control of acute pain  Outcome: Not Met  Pt continues to state pain 9-10/10, md paged and orders placed to increase pca dose. Pt continuously asking for "extra shot of dilaudid" stating pain 10-10 but appears to be resting comfortably watching tv and laughing with staff. MD paged and did assess pt. Will continue to monitor.     Problem: Nausea/Vomiting  Goal: Absence of nausea  Outcome: Not Met  Pt medicated x2 with prn IV phenergan, no complaints of vomiting at this time.     Problem: Coping - Ineffective, Patient and Family  Goal: Effective or improved coping  Outcome: Met  Pt very upset and anxious at start of shift, charge nurse spoke with pt and attempted to re-direct and calm pt. Once orders place to increase pca dose pt appeared less upset. Shortly after pt again began to become upset and anxious, paged md and orders placed for 1x dose of 1mg  Ativan PO. Once medicated pt much more calm, talkative and pleasant with staff.

## 2012-04-25 NOTE — Interdisciplinary (Signed)
Text page to first to call on provider notification [phone]: Pt states she wants to be on IV and oral pain medications PRN and d/c the PCA.

## 2012-04-25 NOTE — Progress Notes (Signed)
Progress Note  White/Trauma    Patient Name: Alexandria Proctor  MRN: [id number]  Room#: 696/295M    Hospital Day:   3 days - Admitted on: 04/22/2012 HD#: 3  Service: Surgery, White    56 year old female admitted with history of gastric bypass surgery w/5d history of intense abdominal pain with n/v.    SUBJECTIVE:  Patient still with complaints of abdominal pain despite starting dilaudid PCA per pain recs and subsequently increasing demand dose from 0.2 to 0.3.  Required 1mg  ativan overnight due to agitation/difficulty with nursing/ongoing complaints of uncontrolled pain.      OBJECTIVE:  Vital Signs:     Latest Entry  Range (last 24 hours)    Temperature: 97 F (36.1 C)  Temp  Avg: 97.9 F (36.6 C)  Min: 97 F (36.1 C)  Max: 98.6 F (37 C)    Blood pressure (BP): 137/87 mmHg  BP  Min: 122/79  Max: 156/84    Heart Rate: 54   Pulse  Avg: 59.8   Min: 50   Max: 81     Respirations: 18   Resp  Avg: 18.5   Min: 18   Max: 20     SpO2: 99 %  SpO2  Avg: 98.3 %  Min: 96 %  Max: 100 %     Weight - scale: 85 kg (187 lb 6.3 oz)  Percentage Weight Change (%): 3.53 %    08/03 0600 - 08/04 0559  In: 2001.7 [I.V.:2001.7]  Out: -   Bowel movement: x3    Physical:    Gen: nontoxic appearing, sitting at edge of bed  Abd: soft, obese, mof TTP in all quadrants, no rebound tenderness, no guarding    Labs:     CBC  Recent Labs      04/22/12   0555  04/23/12   0920  04/24/12   0050   WBC  5.8  4.8  6.0   HGB  11.3  11.1*  10.9*   HCT  34.1  32.9*  32.8*   PLT  299  282  265   SEG  62  51   --    LYMPHS  25  32   --    MONOS  9  10   --         Chemistry  Recent Labs      04/23/12   0920   NA  141   K  3.6   CL  106   BICARB  27   BUN  6   CREAT  0.80   GLU  63*   [place]  9.1     Recent Labs      04/23/12   0920   ALK  60   AST  18   ALT  15   TBILI  0.2   ALB  3.5          Coags  No results found for this basename: PT, PTT, INR,  in the last 72 hours    Toxicology:  No components found with this basename: ETOHPLASMA,  AMPHETCL,  BARBCLASS,   BENZDIAZCL,  BENZYLCGNN,  METHADONE,  OPIATESCL,  OXY,  PHENCYCLDN,  PROPOXYPHN,  TETCANNABIN       Radiology:     XRAY 8/3: nonobstructive gas pattern    CT Scan (on admission)  IMPRESSION: CT scan with IV contrast    1. No evidence of obstruction and no fluid collections visualized in the  abdomen or pelvis. No acute findings to account for patient's abdominal pain.    2. 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    3. 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 Right renal cyst measuring 1.1   x 1 cm.    5. Grade 1 anterolisthesis of L4 on L5.    CT Scan (08/01 @ 20:00)  Prelim read "inflammation of ascending colon"  IMPRESSION: CT scan with IV contrast    1. Dilation of a segment of ileum in the deep pelvis with adjacent focal   narrowing suggesting an adhesion. Appearance is new from prior exam, note   that oral contrast is seen passing through the small bowel into the cecum.   Findings may represent a partial obstruction - recommend continued clinical   follow-up.     2. Other findings are unchanged from prior exam. Please see report from CT   abdomen and pelvis 04/22/2012 at 1:01 a.m.          Medications:  Scheduled Meds       . ciprofloxacin (CIPRO) IVPB  400 mg Q12H   . docusate sodium  250 mg HS   . enoxaparin  30 mg Q12H   . lansoprazole  30 mg BID AC   . LORazepam  1 mg Once   . metroNIDAZOLE  500 mg Q8H   . potassium chloride  20 mEq Once   . sodium chloride (PF)  3 mL Q8H     PRN Meds       . acetaminophen  1,000 mg Q6H PRN   . acetaminophen  650 mg Q4H PRN   . DISCONTD: HYDROmorphone  1 mg Q1H PRN   . DISCONTD: HYDROmorphone  1.5 mg Q1H PRN   . DISCONTD: HYDROmorphone  2 mg Q2H PRN   . menthol  1 lozenge Q2H PRN   . naloxone  0.1 mg Q2 Min PRN   . promethazine (PHENERGAN) IVPB  12.5 mg Q6H PRN   . sodium chloride (PF)  3 mL PRN   . sodium chloride   Continuous  PRN       ASSESSMENT / PLAN:    56 year old female admitted w/history of gastric bypass now with abdominal pain otherwise normal imaging and labs. Reread on CT scan c/f partial SBO. Patient continues to have bowel movement and pass gas. Continue with broad spectrum conservative management of abdominal pain.     Neuro/Pain: dilaudid pca 0.3 demand    -fu pain recs re: converting back to PO  CV: hemodynamically stable, no issues  FEN/GI: NS@100  changed to D51/2NS +20K, serial abdominal exams, stool cx and C.diff reordered, no results in chart    -CLD started, ADAT   -hypoglycemia protocol ordered for Glu 50s this AM  Psych: Pt emotionally labile, ? Drug seeking vs conversion d/o- f/u psych recs

## 2012-04-25 NOTE — Progress Notes (Signed)
Dictating Practitioner:  Farrel Conners, M.D.     [facility]    Date of Note:  04/24/2012    This is a 56 year old female hospital day #2 after admission with abdominal  pain, nausea and vomiting. The majority of her symptoms have resolved and  she feels much better today. CT scan with evidence of colitis. She did have  1 bowel movement overnight. No evidence of bowel obstruction. WBC is  normal.    We will repeat a KUB to assess her bowel gas pattern. We will continue NPO,  I.V. antibiotics, fluid hydration and bowel rest.        Electronically signed by:  Farrel Conners, M.D. 05/10/2012 07:11 A          DD: 04/24/2012 TD: 12:00 P   DT:  04/25/2012 11:15 P   DocNo.:  [id number]  RSC/srf    cc:

## 2012-04-25 NOTE — Interdisciplinary (Signed)
Text page to first to call on white surgery: Alexandria Proctor pt shaky 1335 checked FSG 79, gave her 8 oz juice will reassess in 15 minutes

## 2012-04-25 NOTE — Plan of Care (Signed)
Problem: Thromboemboli, Risk of  Goal: Absence of signs and symptoms of Thromboemboli  Outcome: Met  SCDs in place bilaterally, denies SOB or chest pain. Patient able to describe risk factors leading to developing thrombosis, will continue to assess neurovascular status.    Problem: Falls - Risk of  Goal: Absence of falls  Outcome: Met  Call light and bedside table within reach, guard rails 2/4 up, bed wheels locked in lowest position, Patient aware and demonstrates understanding to use call light for assistance with ambulation. Pt ambulates with steady gait.    Problem: Pain - Acute  Goal: Control of acute pain  Outcome: Met  Please refer to pain flow sheet for more details & pain reassessment. Patient states her pain is managed with current pain medication regime. Pt c/o pain to abdomen - PCA d/c'ed and pt currently on PRN medication plan    Problem: Nausea/Vomiting  Goal: Absence of nausea  Outcome: Met  Pt's stomach tender to touch, pt denies nausea no emesis this shift. Pt tolerates PO fluids on clear liquid diet without issue. Pt advanced to regular diet at end of shift.

## 2012-04-25 NOTE — Progress Notes (Signed)
Dictating Practitioner:  Farrel Conners, M.D.    [facility]    Date of Note:  04/23/2012    This is a 56 year old female hospital day #2 with history of a gastric  bypass and admitted with abdominal pain. EGD 2 days ago was normal. CT scan  done yesterday is consistent with mild cecal colitis. She has been started  on Ciprofloxacin and Flagyl. Her labs this morning are pending.    We will make her NPO and continue the antibiotics for at least 7 days. Her  abdominal pain is improving and she continues to have bowel movements. We  will continue serial abdominal exams and her PPI.        Electronically signed by:  Farrel Conners, M.D. 05/10/2012 07:11 A          DD: 04/23/2012 TD: 12:00 P   DT:  04/25/2012 07:28 P   DocNo.:  [id number]  RSC/srf    cc:

## 2012-04-25 NOTE — Plan of Care (Signed)
Problem: Alteration in Blood Glucose  Goal: Glucose level within specified parameters  Outcome: Met  Please refer to doc flowsheets and interdisciplinary notes. MD aware of hypoglycemic episodes this shift.

## 2012-04-26 LAB — CBC WITH DIFF, BLOOD
ANC-Automated: 3.5 10*3/uL (ref 1.6–7.0)
ANC-Instrument: 3.5 10*3/uL (ref 1.6–7.0)
Abs Eosinophils: 0.4 10*3/uL (ref 0.0–0.5)
Abs Lymphs: 0.8 10*3/uL (ref 0.8–3.1)
Abs Monos: 0.5 10*3/uL (ref 0.2–0.8)
Eosinophils: 8 % — ABNORMAL HIGH (ref 1–7)
Hct: 35.5 % (ref 34.0–45.0)
Hgb: 11.9 gm/dL (ref 11.2–15.7)
Lymphocytes: 15 % — ABNORMAL LOW (ref 19–53)
MCH: 27.7 pg (ref 26.0–32.0)
MCHC: 33.5 % (ref 32.0–36.0)
MCV: 82.8 um3 (ref 79.0–95.0)
MPV: 9.2 fL — ABNORMAL LOW (ref 9.4–12.4)
Monocytes: 10 % (ref 5–12)
Plt Count: 298 10*3/uL (ref 140–370)
RBC: 4.29 10*6/uL (ref 3.90–5.20)
RDW: 15.9 % — ABNORMAL HIGH (ref 12.0–14.0)
Segs: 67 % (ref 34–71)
WBC: 5.3 10*3/uL (ref 4.0–10.0)

## 2012-04-26 LAB — BASIC METABOLIC PANEL, BLOOD
BUN: 12 mg/dL (ref 6–20)
Bicarbonate: 27 mmol/L (ref 22–29)
Calcium: 9.3 mg/dL (ref 8.6–10.5)
Chloride: 108 mmol/L — ABNORMAL HIGH (ref 98–107)
Creatinine: 0.84 mg/dL (ref 0.51–0.95)
GFR: >60 mL/min
Glucose: 85 mg/dL (ref 70–115)
Potassium: 4.2 mmol/L (ref 3.5–5.1)
Sodium: 142 mmol/L (ref 136–145)

## 2012-04-26 LAB — TSH, BLOOD: TSH: 1.64 u[IU]/mL (ref 0.27–4.20)

## 2012-04-26 LAB — GLUCOSE POCT, BLOOD
Meter Glucose (POC): 95 mg/dL (ref 70–115)
Meter Glucose (POC): 79 mg/dL (ref 70–115)
Meter Glucose (POC): 157 mg/dL — ABNORMAL HIGH (ref 70–115)
Meter Glucose (POC): 122 mg/dL — ABNORMAL HIGH (ref 70–115)
Meter Glucose (POC): 127 mg/dL — ABNORMAL HIGH (ref 70–115)
Meter Glucose (POC): 78 mg/dL (ref 70–115)
Meter Glucose (POC): 111 mg/dL (ref 70–115)

## 2012-04-26 LAB — FOLIC ACID, BLOOD: Folate: 9.9 ng/mL (ref 3.1–17.5)

## 2012-04-26 LAB — PHOSPHORUS, BLOOD: Phosphorous: 3.1 mg/dL (ref 2.7–4.5)

## 2012-04-26 LAB — MAGNESIUM, BLOOD: Magnesium: 1.9 mg/dL (ref 1.7–2.6)

## 2012-04-26 LAB — VITAMIN B12, BLOOD: Vitamin B12: 1010 pg/mL — ABNORMAL HIGH (ref 211–946)

## 2012-04-26 MED ORDER — OXYCODONE-ACETAMINOPHEN 7.5-325 MG OR TABS
1.0000 | ORAL_TABLET | Freq: Four times a day (QID) | ORAL | Status: DC | PRN
Start: 2012-04-26 — End: 2018-01-05

## 2012-04-26 MED ORDER — DOCUSATE SODIUM 250 MG OR CAPS
250.0000 mg | ORAL_CAPSULE | Freq: Every day | ORAL | Status: DC
Start: 2012-04-26 — End: 2018-04-20

## 2012-04-26 MED ORDER — HYDROMORPHONE HCL 2 MG OR TABS
2.0000 mg | ORAL_TABLET | Freq: Four times a day (QID) | ORAL | Status: DC | PRN
Start: 2012-04-26 — End: 2018-01-05

## 2012-04-26 MED ORDER — LANSOPRAZOLE 30 MG OR CPDR
30.0000 mg | DELAYED_RELEASE_CAPSULE | Freq: Two times a day (BID) | ORAL | Status: DC
Start: 2012-04-26 — End: 2018-01-05

## 2012-04-26 MED ORDER — SENNA 8.6 MG OR TABS
8.6000 mg | ORAL_TABLET | Freq: Every day | ORAL | Status: DC | PRN
Start: 2012-04-26 — End: 2018-04-20

## 2012-04-26 NOTE — Discharge Instructions (Addendum)
Diagnosis and Reason for Admission    You were admitted to the hospital for the following reason(s):  Abdominal pain    Your full diagnosis list is located on this After Visit Summary in the Hospital Problems section.    What Happened During Your Hospital Stay    The main tests and treatments done for you during this hospitalization were:    EGD and CT scan of abdomen/pelvis     The following evaluation is still important to complete after discharge from the hospital:  Follow-up with a primary care physician    Instructions for After Discharge    Your diet at home should be a low-fat diet.    Your activity level at home should be:  as much exercise or activity as you can tolerate.    Specific activity restrictions:    None    Wound or tube care instructions:  None    Your medication list is located on this After Visit Summary in the Current Discharge Medication List section.  Your nurse will review this information with you before you leave the hospital.    It is very important for you to keep a current medication list with you in order to assist your doctors with your medical care.  Bring this After Visit Summary with you to your follow up appointments.    Reasons to Contact a Doctor Urgently    Call 911 or return to the hospital immediately if:  You experience fever, severe abdominal pain, vomiting, lack of bowel movement or gas.     If you have any questions about your hospital care, your medications, or if you have new or concerning symptoms soon after going home from the hospital, and you need to contact your hospital physician, your hospital physician can be contacted in the following manner:  [facility] operator at [phone].    Once you are able to see your primary care physician (PCP), your PCP will then be responsible for further medication refills, or appointment referrals.    What Needs to Happen Next After Discharge -- Appointments and Follow Up    Any appointments already scheduled at  [facility] will be listed in the Future Appointments section at the top of this After Visit Summary.  Any appointments that have been requested, but have not yet been scheduled, will be listed below that under Post Discharge Referrals.    Sometimes tests performed in the hospital do not yet have results by the time a patient goes home.  The following key tests will need to be followed up at your next appointment: None    Medical Home Information    Your primary care provider or clinic currently on file at [facility] is: No Pcp, Per Patient    If we are assigning you a new medical home for your follow up after discharge, that information will appear here:        Specific timing of follow-up:  You will be contacted for an appointment with a primary care physician.  You do not need to follow-up with general surgery.    Additional Discharge Information (if applicable)    You have been written for prevacid, which you should take daily for the marginal ulcers found on EGD (see above).  This will prevent future complications such as pain or bleeding.  You have been written for short-term pain medication, this should be used only as needed and discontinued as soon as tolerated.  Should you experience futhur pain  issues, you will need to follow-up with primary care physician.  General surgery cannot manage pain issues unrelated to surgical procedures.  You have also been written for a stool softener and a laxative to prevent constipation while taking dilaudid.

## 2012-04-26 NOTE — Discharge Summary (Signed)
Patient Name:  Alexandria Proctor    Principal Diagnosis (required):  Abdominal pain       Additional Hospital Diagnoses ("rule out" or "suspected" diagnoses, etc.):  None    Principal Procedure Performed During This Hospitalization (required):  EGD    Other Procedures Performed During This Hospitalization (required):  CT imaging of ABDOMEN/PELVIS    Procedure results are available in Chart Review in Epic.  For those providers external to [facility], the key procedure results are listed below:    EGD 04/22/2012  Endoscopic Diagnosis: 1.Two small clean based ulcerations   at the gasrtojejunal anastomotic site consistent with   marginal ulcers. It is unlikely these findings explain her right   mid to lower abdominal pain   2.Normal afferent and efferent limbs   3.Small gastric pouch (5cm)   Recommendations: 1. Continue PPI   2.Consider small bowel imaging either with small bowel   follow-through or CT with oral contrast or CT enterography   per bariatric surgery team   3.She should also have a colonoscopy which can be done   as an outpatient   4. Monitor CBC and watch for further bleeding     CT abdomen/pelvis 04/22/2012  IMPRESSION: CT scan with IV contrast  1. No evidence of obstruction and no fluid collections visualized in the   abdomen or pelvis. No acute findings to account for patient's abdominal pain.  2. 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  3. 6-mm hypoattenuating lesion in segment 6 of the liver, too small to   characterize  4. Congenital malrotation of the right kidney. Right renal cyst measuring 1.1   x 1 cm.  5. Grade 1 anterolisthesis of L4 on L5.        Consultations Obtained During This Hospitalization:  Gastroenterology    Psychiatry    Pain service    Key consultant recommendations:    GI: As above (see EGD results)    Psychiatry:  Patient should have primary care follow-up    Pain:  Recommended outpatient dilaudid PO on temporary basis only.    Reason for  Admission to the Hospital / Initial Presentation:  Alexandria Proctor is a 56 year old female who is here for evaluation of the above chief complaint. Patient is s/p gastric bypass in [place] in 2003 and multiple surgeries for a ventral hernia. Presents to ED reporting 5 days of abdominal pain, 3 days of melena. +N/V. Denies hematemesis. Nml bowel movements. Passing gas.       Hospital Course by Problem (required):    Patient underwent CT abdomen/pelvis that did not show a clear etiology of her abdominal pain.  Due to the severe nature of her pain, she was admitted to general surgery for monitoring and kept NPO with IVFs.  She later underwent an EGD that showed marginal ulcers but no cause of her pain.  She was maintained on PPI and started on PO and IV pain medication; however, her pain was uncontrolled and thus pain service was consulted.  They recommended dilaudid PCA which was converted back to PO dilaudid prior to discharge.  She was also seen by psychiatry for labile behavior; however they did not feel she required pyschiatric follow-up.  She was advance to a regular diet, having BMs, ambulating by DOD.  Her pain was well-controlled on PO dilaudid.            Discharge Condition (required): improved      Key Physical Exam  Findings at Discharge:  Physical examination is signifcant for:  abdomen soft, ND, NTTP.    Discharge Diet:  Low-fat / cardiac.    Discharge Medications:  Current Discharge Medication List      START taking these medications    Details   docusate sodium (COLACE) 250 MG capsule Take 1 capsule by mouth daily.  Qty: 30 capsule, Refills: 0    Associated Diagnoses: Abdominal pain      HYDROmorphone (DILAUDID) 2 MG tablet Take 1 tablet by mouth every 6 hours as needed for Moderate Pain (Pain Score 4-6) or Severe Pain (Pain Score 7-10).  Qty: 28 tablet, Refills: 0    Associated Diagnoses: Abdominal pain      lansoprazole (PREVACID) 30 MG capsule Take 1 capsule by mouth 2 times daily (before meals).  Qty:  30 capsule, Refills: 3    Associated Diagnoses: Abdominal pain      oxycodone-acetaminophen (PERCOCET) 7.5-325 MG per tablet Take 1 tablet by mouth every 6 hours as needed for Breakthrough Pain.  Qty: 15 tablet, Refills: 0    Associated Diagnoses: Abdominal pain      senna (SENOKOT) 8.6 MG tablet Take 1 tablet by mouth daily as needed for Constipation.  Qty: 20 tablet, Refills: 0    Associated Diagnoses: Abdominal pain         STOP taking these medications       Omeprazole (PRILOSEC PO) Comments:   Reason for Stopping:               Allergies:  Allergies   Allergen Reactions   . Compazine Anaphylaxis     Per patient tolerates promethazine/Phenergan   . Reglan (Metoclopramide) Anaphylaxis   . Toradol Anaphylaxis   . Tramadol Anaphylaxis     Per patient, tolerates hydromorphone (Dilaudid) and morphine    . Zofran (Ondansetron) Anaphylaxis       Discharge Disposition:  Home.    Discharge Code Status:  Full code / full care  This code status is not changed from the time of admission.    Follow Up Appointments:    Scheduled appointments:  No future appointments.      The patient was requested for a PMD appointment and comment left to arrange follow-up colonoscopy, as she has not had one and is >50 years of age.  Patient was also informed to follow-up with PMD and have screening colonoscopy done.    For appointments requested for after discharge that have not yet been scheduled, refer to the Post Discharge Referrals section of the After Visit Summary.    Discharging Physician's Contact Information:  [facility] operator at [phone].

## 2012-04-26 NOTE — Plan of Care (Signed)
Problem: Nausea/Vomiting  Goal: Absence of nausea  Outcome: Met  Tolerated all po intake with no nausea or vomiting.    Problem: Alteration in Blood Glucose  Goal: Glucose level within specified parameters  Outcome: Met  Per resource RN Seward Meth, patient was c/o that she feels her blood sugar is low, but fingerstick was 155.

## 2012-04-26 NOTE — Plan of Care (Signed)
Problem: Discharge Planning  Goal: Participation in care planning  Outcome: Met  No acute distress noted; given d/c summary instructions with reports of good understanding of teachings; waiting for ride home.

## 2012-04-26 NOTE — Progress Notes (Signed)
Dictating Practitioner:  Farrel Conners, M.D.        Date of Note:  04/25/2012    A 56 y/o female hospital day 3 after admission with abdominal pain.  Colitis on CT scan.  She is passing flatus, having bowel movements and  tolerating a diet.  She continues to have pain though is improved.  The  pain team is on board.  Awaiting recommendations for an oral regimen for  pain control.        Electronically signed by:  Farrel Conners, M.D. 05/10/2012 07:11 A          DD: 04/25/2012 TD: 12:00 P   DT:  04/26/2012 02:18 P   DocNo.:  [id number]  RSC/lds    cc:

## 2012-04-26 NOTE — Plan of Care (Signed)
Problem: Discharge Planning  Goal: Participation in care planning  Outcome: Met  No acute distress noted; d/c'd home at 1340, with all personal belongings; ambulated downstairs per patient's preference, with steady gait.

## 2012-04-26 NOTE — Plan of Care (Signed)
Problem: Coping - Ineffective, Patient and Family  Goal: Effective or improved coping  Outcome: Met  Calm and cooperative, but appears suspicious about Dilaudid po med given (was flipping and looking at the tablet on her hand, and reached at the trash can and looked at the packet before taking the med.).

## 2012-04-26 NOTE — Plan of Care (Signed)
Problem: Falls - Risk of  Goal: Absence of falls  Outcome: Met  x2 side rails up, bed in lowest and locked position, call light with in reach at all times and pt uses appropriately for assistance to BR. Pt gait steady moderate assist with ambulation. No falls or injuries noted at this time.         Problem: Pain - Acute  Goal: Control of acute pain  Outcome: Met  Pt repeatedly asking nurse and charge for increase in prn Dilaudid, md notified and when meds not increased pt became agitated and upset stating pain still 10/10. MD notified and stated would come up to assess pt, informed pt and she appeared calm and resting watching tv and laughing. Pt appeared to be sleeping and longer requested increase in meds. Pt medicated x2 with prn PO dilaudid and x1 with prn IV dilaudid.     Problem: Nausea/Vomiting  Goal: Absence of nausea  Outcome: Not Met  Pt medicated 12 with prn IV phenergan, no complaints of vomiting at this time.         Problem: Coping - Ineffective, Patient and Family  Goal: Effective or improved coping  Outcome: Met  Pt appeared less anxious and did not receive prn ativan tonight, nurse and charge nurse spoke with pt several times regarding pain regimen. Pt requesting to page md several time for increased pain meds, md notified and stated would assess pt before ordering increase on prn dilaudid. Informed pt of md response, attempted to re-direct and include pt in plan of care. Pt was able to sleep quietly through night.     Problem: Alteration in Blood Glucose  Goal: Glucose level within specified parameters  Outcome: Met  HS blood sugar 111

## 2012-04-26 NOTE — Plan of Care (Signed)
Problem: Thromboemboli, Risk of  Goal: Absence of signs and symptoms of Thromboemboli  Outcome: Met  No s/s of DVT noted; compliant with receiving Lovenox med.    Problem: Falls - Risk of  Goal: Absence of falls  Outcome: Met  Ambulated in room and hallways independently, with steady gait; kept safe; able to make needs known; compliant with fall precautions.    Problem: Pain - Acute  Goal: Control of acute pain  Outcome: Met  Refer to pain assessment flow sheet; reported that pain meds were effective; encouraged and assisted with relaxation tech and diversions; noted taking naps throughout the day; to d/c home with pain meds.

## 2012-04-26 NOTE — Progress Notes (Shared)
Progress Note  White/Trauma    Patient Name: Alexandria Proctor  MRN: [id number]  Room#: 130/865H    Hospital Day:   4 days - Admitted on: 04/22/2012 HD#: 4  Service: Surgery, White    56 year old female admitted with history of gastric bypass surgery w/5d history of intense abdominal pain with n/v.    SUBJECTIVE:  Patient still with complaints of abdominal pain although improved from previous. Patient now on PO regimen with no IV breakthrough which patient tolerated.     OBJECTIVE:  Vital Signs:     Latest Entry  Range (last 24 hours)    Temperature: 98.1 F (36.7 C)  Temp  Avg: 97.7 F (36.5 C)  Min: 97 F (36.1 C)  Max: 98.1 F (36.7 C)    Blood pressure (BP): 134/91 mmHg  BP  Min: 130/78  Max: 147/87    Heart Rate: 68   Pulse  Avg: 60.6   Min: 54   Max: 68     Respirations: 18   Resp  Avg: 18   Min: 18   Max: 18     SpO2: 97 %  SpO2  Avg: 97.6 %  Min: 97 %  Max: 99 %     Weight - scale: 85 kg (187 lb 6.3 oz)  Percentage Weight Change (%): 3.53 %    08/04 0600 - 08/05 0559  In: 1260 [P.O.:1060; I.V.:200]  Out: 400 [Urine:400]  Bowel movement: x3    Physical:    Gen: nontoxic appearing, sitting at edge of bed  Abd: soft, obese, moderately TTP in all quadrants, no rebound tenderness, no guarding    Labs:     CBC  Recent Labs      04/23/12   0920  04/24/12   0050  04/25/12   0500   WBC  4.8  6.0  6.1   HGB  11.1*  10.9*  10.6*   HCT  32.9*  32.8*  32.2*   PLT  282  265  264   SEG  51   --   63   LYMPHS  32   --   20   MONOS  10   --   8        Chemistry  Recent Labs      04/23/12   0920  04/25/12   0500   NA  141  142   K  3.6  3.4*   CL  106  106   BICARB  27  27   BUN  6  9   CREAT  0.80  0.83   GLU  63*  52*   [place]  9.1  8.9   MG   --   1.8   PHOS   --   3.9     Recent Labs      04/23/12   0920   ALK  60   AST  18   ALT  15   TBILI  0.2   ALB  3.5          Coags  No results found for this basename: PT, PTT, INR,  in the last 72 hours    Toxicology:  No components found with this basename: ETOHPLASMA,  AMPHETCL,   BARBCLASS,  BENZDIAZCL,  BENZYLCGNN,  METHADONE,  OPIATESCL,  OXY,  PHENCYCLDN,  PROPOXYPHN,  TETCANNABIN       Radiology:     XRAY 8/3: nonobstructive gas pattern    CT Scan (on  admission)  IMPRESSION: CT scan with IV contrast    1. No evidence of obstruction and no fluid collections visualized in the   abdomen or pelvis. No acute findings to account for patient's abdominal pain.    2. 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    3. 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 Right renal cyst measuring 1.1   x 1 cm.    5. Grade 1 anterolisthesis of L4 on L5.    CT Scan (08/01 @ 20:00)  Prelim read "inflammation of ascending colon"  IMPRESSION: CT scan with IV contrast    1. Dilation of a segment of ileum in the deep pelvis with adjacent focal   narrowing suggesting an adhesion. Appearance is new from prior exam, note   that oral contrast is seen passing through the small bowel into the cecum.   Findings may represent a partial obstruction - recommend continued clinical   follow-up.     2. Other findings are unchanged from prior exam. Please see report from CT   abdomen and pelvis 04/22/2012 at 1:01 a.m.          Medications:  Scheduled Meds       . ciprofloxacin (CIPRO) IVPB  400 mg Q12H   . docusate sodium  250 mg HS   . enoxaparin  30 mg Q12H   . lansoprazole  30 mg BID AC   . magnesium oxide  400 mg Once   . metroNIDAZOLE  500 mg Q8H   . potassium chloride  30 mEq Q2H   . potassium chloride  10 mEq Q1H   . sodium chloride (PF)  3 mL Q8H     PRN Meds       . acetaminophen  650 mg Q4H PRN   . dextrose  12.5 g PRN   . glucagon  1 mg Once PRN   . glucose 40%  1 Tube PRN   . glucose  4 tablet PRN   . HYDROmorphone  0.5 mg Q4H PRN   . DISCONTD: HYDROmorphone  2 mg Q8H PRN   . HYDROmorphone  2 mg Q4H PRN   . menthol  1 lozenge Q2H PRN   . DISCONTD: naloxone  0.1 mg Q2 Min PRN   .  promethazine (PHENERGAN) IVPB  12.5 mg Q6H PRN   . sodium chloride (PF)  3 mL PRN   . sodium chloride   Continuous PRN   . zolpidem  5 mg Nightly PRN       ASSESSMENT / PLAN:    56 year old female admitted w/history of gastric bypass now with abdominal pain otherwise normal imaging and labs. Reread on CT scan c/f partial SBO. Patient continues to have bowel movement and pass gas. Patient with negative workup today and with PO management of pain, ready for discharge to home today.     Neuro/Pain: dilaudid 2mg  PO  CV: hemodynamically stable, no issues  FEN/GI: reg diet, on hypoglycemia protocol  Psych: psych w/u positive only for insomina    Dispo: home today    Patient seen and discussed with chief on call.    Tomasa Rand  General Surgery, PGY-1  Pager [phone]

## 2012-04-27 NOTE — Progress Notes (Signed)
Dictating Practitioner:  Henriette Combs, M.D.        Date of Note:  04/26/2012    A 56 y/o female with history of gastric bypass hospital day 5 for abdominal  pain found to have mild cecal colitis.  She reports she is feeling  considerably better than on admission.  She is afebrile, hemodynamically  stable and tolerating a diet. She remains on Cipro and flagyl for colitis.  Ins and outs are 2.3 L in, 1.1 L out plus unrecorded voids.  She is having  bowel function.  Labs are unremarkable.    The plan today will be for her to discharge home.  We will ensure she has  follow up with a primary care physician for the colitis and that she has a  report of her esophagogastroduodenoscopy although it was unremarkable.        Electronically signed by:  Henriette Combs, M.D. 04/28/2012 05:02 P          DD: 04/26/2012 TD: 12:00 P   DT:  04/27/2012 02:19 P   DocNo.:  [id number]  TWC/lds    cc:

## 2012-04-28 ENCOUNTER — Telehealth (INDEPENDENT_AMBULATORY_CARE_PROVIDER_SITE_OTHER): Payer: Self-pay

## 2012-04-28 LAB — CALCITRIOL, BLOOD: Vitamin D, 1,25-Dihydroxy: 51 pg/mL (ref 15–75)

## 2012-04-28 NOTE — Telephone Encounter (Signed)
L/m to have pt call back so we can give some options for community care clinics in the area.        We do not take pt insurance unable to see be seen at this clinic.

## 2012-05-05 NOTE — H&P (Signed)
I have examined this patient and agree with above.

## 2012-06-12 ENCOUNTER — Encounter (HOSPITAL_COMMUNITY): Payer: Self-pay

## 2012-06-12 LAB — URINALYSIS
Nitrite: NEGATIVE
pH: 6 (ref 5.0–8.0)

## 2012-06-12 MED ORDER — PROCHLORPERAZINE EDISYLATE 5 MG/ML IJ SOLN
10.00 mg | Freq: Once | INTRAMUSCULAR | Status: AC
Start: 2012-06-12 — End: 2012-06-12
  Administered 2012-06-12: 10 mg via INTRAVENOUS
  Filled 2012-06-12: qty 2

## 2012-06-12 MED ORDER — HYDROMORPHONE HCL 1 MG/ML IJ SOLN
1.00 mg | Freq: Once | INTRAMUSCULAR | Status: AC
Start: 2012-06-12 — End: 2012-06-12
  Administered 2012-06-12: 1 mg via INTRAMUSCULAR
  Filled 2012-06-12: qty 1

## 2012-06-12 MED ORDER — DIPHENHYDRAMINE HCL 50 MG/ML IJ SOLN
25.00 mg | Freq: Once | INTRAMUSCULAR | Status: AC
Start: 2012-06-13 — End: 2012-06-13
  Administered 2012-06-13: 25 mg via INTRAVENOUS
  Filled 2012-06-12: qty 50

## 2012-06-12 MED ORDER — SODIUM CHLORIDE 0.9 % IV BOLUS
1000.00 mL | INJECTION | Freq: Once | INTRAVENOUS | Status: AC
Start: 2012-06-12 — End: 2012-06-12
  Administered 2012-06-12: 1000 mL via INTRAVENOUS

## 2012-06-12 MED ORDER — SODIUM CHLORIDE 0.9 % IV BOLUS
1000.00 mL | INJECTION | Freq: Once | INTRAVENOUS | Status: AC
Start: 2012-06-12 — End: 2012-06-13
  Administered 2012-06-12: 1000 mL via INTRAVENOUS

## 2012-06-12 MED ORDER — HYDROMORPHONE HCL 1 MG/ML IJ SOLN
1.00 mg | Freq: Once | INTRAMUSCULAR | Status: AC
Start: 2012-06-13 — End: 2012-06-13
  Administered 2012-06-13: 1 mg via INTRAVENOUS
  Filled 2012-06-12: qty 1

## 2012-06-12 MED ORDER — HYDROMORPHONE HCL 1 MG/ML IJ SOLN
0.50 mg | Freq: Once | INTRAMUSCULAR | Status: AC
Start: 2012-06-12 — End: 2012-06-12
  Administered 2012-06-12: 0.5 mg via INTRAVENOUS
  Filled 2012-06-12: qty 0.5

## 2012-06-12 MED ORDER — HYDROMORPHONE HCL 1 MG/ML IJ SOLN
1.00 mg | Freq: Once | INTRAMUSCULAR | Status: AC
Start: 2012-06-12 — End: 2012-06-12
  Administered 2012-06-12: 1 mg via INTRAVENOUS
  Filled 2012-06-12: qty 1

## 2012-06-12 NOTE — ED Notes (Signed)
Assumed care of pt, report received from Jaclyn Shaggy, Rn.   No IV access after multiple attempts by day staff. MD Ward at bedside to place Central line, Consent signed and placed in chart. Pt to be medicated with IM Dilaudid 1mg . Pt calm  And cooperatve

## 2012-06-12 NOTE — ED Notes (Signed)
Pt. Hard IV stick. Jill Alexanders RN and Psychiatrist at bedside attempting IV start. Dr. Sylvan Cheese aware of IV access.

## 2012-06-12 NOTE — ED Notes (Signed)
Dr. Sylvan Cheese to start a central line.

## 2012-06-12 NOTE — ED Notes (Signed)
Pt. To x-ray .

## 2012-06-12 NOTE — ED Attending Note (Signed)
ED ATTENDING NOTE:        HPI    This patient is a 56 year old presents with abdominal pain. She said it started 3 days ago. It has gotten worse over those 3 days. She describes it as severe in intensity. She describes it as located in the upper abdomen. She is vomiting with each episode and also diarrhea with each episode. The diarrhea is lime green in color. Worse with walking. She tried Motrin and Tylenol but brought no relief. She has never had a complication of her gastric bypass    PMH   Past Medical History   Diagnosis Date   . H/O: hysterectomy    . Gastric bypass status for obesity      Med Current facility-administered medications:HYDROmorphone (DILAUDID) injection 1 mg, 1 mg, IntraVENOUS, Once, Eustace Moore, MD;  sodium chloride 0.9 % bolus 1,000 mL, 1,000 mL, IntraVENOUS, Once, Eustace Moore, MD;  sodium chloride 0.9 % bolus 1,000 mL, 1,000 mL, IntraVENOUS, Once, Eustace Moore, MD  No current outpatient prescriptions on file.  /All - agree with triage  FH - denies similar  SH -smokes tobacco  ROS - all other systems negative    Exam:  Vitals reviewed BP 144/88  Pulse 121  Temp(Src) 99.4 F (37.4 C)  Resp 16  Wt 84.369 kg (186 lb)  SpO2 98%  General wdwn appears extremely uncomfortable, rolling on her left side, splinting.   Neuro: Alert, attentive, conversant  Head: atraumatic nc  Eyes: normal sclera, pupils  ENT: mmm op clear  Neck: no jvd  Chest: cta bilat nonlabored resp  Heart: rrr no mrg  Abd: soft tender to palpation, voluntary guarding is definitely present, she appears to be able to relax with coaching but only transiently. She has tenderness to percussion.  Ext: warm, no edema    Impression and Plan:    Patient presents with severe acute pain and we are concerned about both the general causes of this as well as causes there is specific to her surgical history. Specifically we were worried about internal hernia in addition to pancreatitis and other causes of acute abdomen. We will order  a CT scan because of the time delay anticipated we will also order plain film x-rays. We will give her analgesics. She has difficult IV access but we will attempt to place a peripheral IV.

## 2012-06-12 NOTE — ED Notes (Signed)
Resume care of pt. Resting in gurney c/o abd pain with N/VD x 4 days. Pt. Hard IV stick attempt x 1 to rt. Foot. Unsuccessful.

## 2012-06-12 NOTE — ED Notes (Signed)
12 lead EKG completed in triage and handed to Lemiuex MD. Copy placed in pt's chart.

## 2012-06-12 NOTE — ED Notes (Signed)
RT attempt Arterial stick to Right wrist. Unsuccessful. Pt. Tolerated well. Dr. Sylvan Cheese notified. Pt. Medicated for pain with Dilaudid 1mg  IM rt. Deltoid. Pt. Tolerated well.

## 2012-06-12 NOTE — ED Provider Notes (Signed)
History  Chief Complaint   Patient presents with   . Abdominal Pain     Pt with r flank/r back and r abd pain, +n/v, denies changes with urination.      HPI  56 yo female hx gastric bypass with roux-en-y type here with worsening abdominal pain vomtiing. Pt states that this started 4 days ago, described as stabbing-burning 10/10 with some more dull pain in her back. Numerous assocaited vomiting episodes, some "lime-green" diarrhea, 5-6 episodes. Pain worse with walking, no relationship to food. No fevers chills or dysuria. No prior episodes of this. No chest pain no cough no sputum. Can eat although says it comes out way to quickly in diarrhea. No old records here. Has taken motrin, tylneol without much relief.     Past Medical History   Diagnosis Date   . H/O: hysterectomy    . Gastric bypass status for obesity      FHx +htn  SHx no ivdu cigs etoh    Review of Systems  10 pt ros conducted negative except per hpi    Physical Exam  BP 144/88  Pulse 121  Temp(Src) 99.4 F (37.4 C)  Resp 16  Wt 84.369 kg (186 lb)  SpO2 98%    Physical Exam   Nursing note and vitals reviewed.  Constitutional: She is oriented to person, place, and time. She appears well-developed and well-nourished.   Appears in pain   HENT:   Right Ear: External ear normal.   Eyes: Conjunctivae and EOM are normal. Pupils are equal, round, and reactive to light.   Neck: Normal range of motion. Neck supple.   Cardiovascular: Normal rate and regular rhythm.    Pulmonary/Chest: Effort normal and breath sounds normal. She has no wheezes. She has no rales. She exhibits no tenderness.   Abdominal: Soft. Bowel sounds are normal. She exhibits no distension. There is tenderness. There is no rebound and no guarding.   Old laparotomy scar  ttp mid-r peri-umbilical region   Musculoskeletal: Normal range of motion. She exhibits no edema and no tenderness.   Neurological: She is alert and oriented to person, place, and time. No cranial nerve deficit. She exhibits  normal muscle tone. Coordination normal.   Skin: Skin is warm and dry. No rash noted. She is not diaphoretic. No pallor.     Results for orders placed during the hospital encounter of 06/13/12   CBC WITH ADIFF, BLOOD       Result Value Range    WBC 7.7  4.0 - 10.0 1000/mm3    RBC 4.26  3.90 - 5.20 mill/mm3    Hgb 11.8  11.2 - 15.7 gm/dL    Hct 16.1  09.6 - 04.5 %    MCV 82.6  79.0 - 95.0 um3    MCH 27.7  26.0 - 32.0 pgm    MCHC 33.5  32.0 - 36.0 %    RDW 15.1 (*) 12.0 - 14.0 %    MPV 8.5 (*) 9.4 - 12.4 fL    Plt Count 244  140 - 370 1000/mm3    Segs 66  34 - 71 %    Lymphocytes 24  19 - 53 %    Monocytes 7  5 - 12 %    Eosinophils 3  1 - 7 %    Absolute Neutrophil Count 5.1  1.6 - 7.0 K/uL    Abs Lymphs 1.8  0.8 - 3.1 1000/mm3    Abs Monos 0.5  0.2 -  0.8 1000/mm3    Abs Eosinophils 0.2  0.0 - 0.5 1000/mm3    Diff Type Automated     BASIC METABOLIC PANEL, BLOOD       Result Value Range    Glucose 88  70 - 115 mg/dL    BUN 13  6 - 20 mg/dL    Creatinine 9.60  4.54 - 0.95 mg/dL    GFR >09      Sodium 144  136 - 145 mmol/L    Potassium 3.7  3.5 - 5.1 mmol/L    Chloride 108 (*) 98 - 107 mmol/L    Bicarbonate 27  22 - 29 mmol/L    Calcium 8.7  8.6 - 10.5 mg/dL   URINALYSIS       Result Value Range    Type Not Specified      Color Yellow  Yellow    Appearance Clear  Clear    Specific Gravity 1.019  1.002 - 1.030    pH 6.0  5.0 - 8.0    Protein Trace (*) Negative    Glucose Negative  Negative    Ketones Negative  Negative    Bilirubin Negative  Negative    Blood Negative  Negative    Urobilinogen 0.2-1.0  0.2-1 EU/dL    Nitrite Negative  Negative    Leuk Esterase Small (*) Negative    WBC 3-5 (*) 0-2/HPF    RBC 0-2  0-2/HPF    Bacteria Rare  None-Rare/HPF    Squam. Epithelial Cell Moderate (*) 0-Few/HPF    Mucus Few (*) None-Rare/HPF   LIVER PANEL, BLOOD       Result Value Range    Total Protein 6.2  6.0 - 8.0 g/dL    Albumin 3.6  3.5 - 5.2 g/dL    Bilirubin, Dir 0.1  <0.2 mg/dL    Bilirubin, Tot 0.2  <1.2 mg/dL    AST  (SGOT) 42 (*) 0 - 32 U/L    ALT (SGPT) 48 (*) 0 - 33 U/L    Alkaline Phos 71  35 - 140 U/L   LIPASE, BLOOD       Result Value Range    Lipase 28  13 - 60 U/L   CBC WITH ADIFF, BLOOD       Result Value Range    WBC 6.3  4.0 - 10.0 1000/mm3    RBC 4.05  3.90 - 5.20 mill/mm3    Hgb 11.1 (*) 11.2 - 15.7 gm/dL    Hct 81.1  91.4 - 78.2 %    MCV 84.0  79.0 - 95.0 um3    MCH 27.4  26.0 - 32.0 pgm    MCHC 32.6  32.0 - 36.0 %    RDW 15.5 (*) 12.0 - 14.0 %    MPV 8.8 (*) 9.4 - 12.4 fL    Plt Count 267  140 - 370 1000/mm3    Segs 64  34 - 71 %    Lymphocytes 24  19 - 53 %    Monocytes 8  5 - 12 %    Eosinophils 4  1 - 7 %    Absolute Neutrophil Count 4.1  1.6 - 7.0 K/uL    Abs Lymphs 1.5  0.8 - 3.1 1000/mm3    Abs Monos 0.5  0.2 - 0.8 1000/mm3    Abs Eosinophils 0.2  0.0 - 0.5 1000/mm3    Diff Type Automated         ED Course/Medical Decision Making  Narrative  56 yo female with prior roux-en-y bypass here with complaints of abdominal pain ,diarrhea.  Given prior bypass must consider internal hernia vs other patholgy related to this including obstruction. Less likely appendicitis, pancreatitis ascneding uti or other intra-abodminal process.  Will need labs, ivf, pain control and CT to further eval source of pain.  Pt extremely difficult to botain iv access on, initial attempt at [facility] by [facility] under my supervision unsuccessful in R fem- redid in RIJ per procedure note.  Continued to be given igh amounts of dilaudid without control of pain.  CT showed epiploic apendigitis- no internal hernia or other etiologies of pain. Explained to pt, still uncontrolled pain. Admit meidicine med-surg loc for further management.           Critical Care Time            Additional Notes      Home Medication List  None       Eustace Moore, MD  Resident  06/13/12 [phone]

## 2012-06-12 NOTE — ED Notes (Signed)
Unable to start IV. Dr. Sylvan Cheese notified. RT called for Arterial stick to obtain labs.

## 2012-06-13 ENCOUNTER — Inpatient Hospital Stay
Admission: AD | Admit: 2012-06-13 | Discharge: 2012-06-16 | Disposition: A | Discharge status: Home Health | Attending: Internal Medicine | Admitting: Internal Medicine

## 2012-06-13 HISTORY — DX: Acquired absence of both cervix and uterus: Z90.710

## 2012-06-13 HISTORY — DX: Bariatric surgery status: Z98.84

## 2012-06-13 LAB — ECG 12-LEAD
QRS INTERVAL/DURATION: 86 ms
VENTRICULAR RATE: 113 {beats}/min

## 2012-06-13 MED ORDER — ACETAMINOPHEN 650 MG RE SUPP
650.0000 mg | Freq: Four times a day (QID) | RECTAL | Status: DC | PRN
Start: 2012-06-13 — End: 2012-06-16

## 2012-06-13 MED ORDER — HYDROMORPHONE HCL 1 MG/ML IJ SOLN
1.00 mg | Freq: Once | INTRAMUSCULAR | Status: AC
Start: 2012-06-13 — End: 2012-06-13
  Administered 2012-06-13: 1 mg via INTRAVENOUS
  Filled 2012-06-13: qty 1

## 2012-06-13 MED ORDER — OXYCODONE-ACETAMINOPHEN 5-325 MG OR TABS
2.00 | ORAL_TABLET | Freq: Once | ORAL | Status: AC
Start: 2012-06-13 — End: 2012-06-13
  Administered 2012-06-13: 2 via ORAL
  Filled 2012-06-13: qty 2

## 2012-06-13 MED ORDER — HYDROMORPHONE HCL 2 MG/ML IJ SOLN
2.0000 mg | Freq: Once | INTRAMUSCULAR | Status: DC
Start: 2012-06-13 — End: 2012-06-14

## 2012-06-13 MED ORDER — OXYCODONE-ACETAMINOPHEN 5-325 MG OR TABS
1.0000 | ORAL_TABLET | ORAL | Status: DC | PRN
Start: 2012-06-13 — End: 2012-06-16

## 2012-06-13 MED ORDER — HYDROMORPHONE HCL 1 MG/ML IJ SOLN
1.0000 mg | INTRAMUSCULAR | Status: DC | PRN
Start: 2012-06-13 — End: 2012-06-13

## 2012-06-13 MED ORDER — PROCHLORPERAZINE EDISYLATE 5 MG/ML IJ SOLN
10.0000 mg | Freq: Four times a day (QID) | INTRAMUSCULAR | Status: DC | PRN
Start: 2012-06-13 — End: 2012-06-16
  Administered 2012-06-13 – 2012-06-16 (×9): 10 mg via INTRAVENOUS
  Filled 2012-06-13 (×9): qty 2

## 2012-06-13 MED ORDER — NALOXONE HCL 0.4 MG/ML IJ SOLN
0.1000 mg | INTRAMUSCULAR | Status: DC | PRN
Start: 2012-06-13 — End: 2012-06-16

## 2012-06-13 MED ORDER — LACTATED RINGERS IV SOLN
INTRAVENOUS | Status: AC
Start: 2012-06-13 — End: 2012-06-13

## 2012-06-13 MED ORDER — SODIUM CHLORIDE 0.9 % IJ SOLN (CUSTOM)
3.0000 mL | Freq: Three times a day (TID) | INTRAMUSCULAR | Status: DC
Start: 2012-06-13 — End: 2012-06-16
  Administered 2012-06-13 – 2012-06-16 (×10): 3 mL via INTRAVENOUS

## 2012-06-13 MED ORDER — HEPARIN SODIUM (PORCINE) PF 5000 UNIT/0.5ML IJ SOLN
5000.0000 [IU] | Freq: Three times a day (TID) | INTRAMUSCULAR | Status: DC
Start: 2012-06-13 — End: 2012-06-16
  Administered 2012-06-13 – 2012-06-16 (×10): 5000 [IU] via SUBCUTANEOUS
  Filled 2012-06-13 (×10): qty 0.5

## 2012-06-13 MED ORDER — NICOTINE 14 MG/24HR TD PT24
1.0000 | MEDICATED_PATCH | Freq: Every day | TRANSDERMAL | Status: DC
Start: 2012-06-13 — End: 2012-06-16
  Administered 2012-06-13 – 2012-06-16 (×4): 1 via TRANSDERMAL
  Filled 2012-06-13 (×4): qty 1

## 2012-06-13 MED ORDER — DIPHENHYDRAMINE HCL 50 MG/ML IJ SOLN
25.0000 mg | INTRAMUSCULAR | Status: DC | PRN
Start: 2012-06-13 — End: 2012-06-13

## 2012-06-13 MED ORDER — HYDROMORPHONE PCA 0.2 MG/ML SYRINGE
INTRAMUSCULAR | Status: DC
Start: 2012-06-13 — End: 2012-06-13
  Administered 2012-06-13: 0.4 mg via INTRAVENOUS
  Filled 2012-06-13 (×2): qty 50

## 2012-06-13 MED ORDER — DIPHENHYDRAMINE HCL 25 MG OR TABS OR CAPS CUSTOM
25.00 mg | ORAL_CAPSULE | Freq: Once | ORAL | Status: AC
Start: 2012-06-13 — End: 2012-06-13
  Administered 2012-06-13: 25 mg via ORAL
  Filled 2012-06-13: qty 1

## 2012-06-13 MED ORDER — HYDROMORPHONE HCL 1 MG/ML IJ SOLN
0.50 mg | Freq: Once | INTRAMUSCULAR | Status: AC
Start: 2012-06-13 — End: 2012-06-13
  Administered 2012-06-13: 0.5 mg via INTRAVENOUS
  Filled 2012-06-13 (×2): qty 1

## 2012-06-13 MED ORDER — NAPROXEN 500 MG OR TABS
250.0000 mg | ORAL_TABLET | Freq: Once | ORAL | Status: DC
Start: 2012-06-13 — End: 2012-06-13

## 2012-06-13 MED ORDER — KETOROLAC TROMETHAMINE 30 MG/ML IJ SOLN
30.0000 mg | Freq: Once | INTRAMUSCULAR | Status: DC
Start: 2012-06-13 — End: 2012-06-13

## 2012-06-13 MED ORDER — PATCH REMOVAL
Freq: Every day | Status: DC
Start: 2012-06-13 — End: 2012-06-16

## 2012-06-13 MED ORDER — SODIUM CHLORIDE 0.9 % IJ SOLN (CUSTOM)
3.0000 mL | INTRAMUSCULAR | Status: DC | PRN
Start: 2012-06-13 — End: 2012-06-16
  Administered 2012-06-14: 3 mL via INTRAVENOUS

## 2012-06-13 MED ORDER — HYDROMORPHONE HCL 1 MG/ML IJ SOLN
INTRAMUSCULAR | Status: AC
Start: 2012-06-13 — End: 2012-06-13
  Administered 2012-06-13: 2 mg
  Filled 2012-06-13: qty 2

## 2012-06-13 MED ORDER — HEPARIN SODIUM (PORCINE) PF 5000 UNIT/0.5ML IJ SOLN
5000.0000 [IU] | Freq: Two times a day (BID) | INTRAMUSCULAR | Status: DC
Start: 2012-06-13 — End: 2012-06-13

## 2012-06-13 MED ORDER — OXYCODONE-ACETAMINOPHEN 5-325 MG OR TABS
2.0000 | ORAL_TABLET | ORAL | Status: DC | PRN
Start: 2012-06-13 — End: 2012-06-16
  Administered 2012-06-13 – 2012-06-16 (×15): 2 via ORAL
  Filled 2012-06-13 (×17): qty 2

## 2012-06-13 MED ORDER — HYDROMORPHONE HCL 1 MG/ML IJ SOLN
1.0000 mg | INTRAMUSCULAR | Status: DC | PRN
Start: 2012-06-13 — End: 2012-06-14
  Administered 2012-06-13 – 2012-06-14 (×5): 1 mg via INTRAVENOUS
  Filled 2012-06-13 (×6): qty 1

## 2012-06-13 MED ORDER — SODIUM CHLORIDE 0.9 % IV SOLN
INTRAVENOUS | Status: DC | PRN
Start: 2012-06-13 — End: 2012-06-16

## 2012-06-13 NOTE — Interdisciplinary (Signed)
Patient transferred from 6W for LOC change to tele for acute abd pain. Arrived on floor with PCA Dilaudid and crying and moaning in pain. Pain 10/10 abd, sharp/stabbing, constant. Patient A&Ox4, ambulatory and skin intact. Will continue to monitor closely.

## 2012-06-13 NOTE — ED Notes (Signed)
Pt resting comfortable in bed at this time, medicated per mar for 10/10 abd pain and itching. VSS. 64 bpm NSR. RR even non labored/full/effective/no accessory muscle use noted. Handling own secretions without difficulty. Bed low locked position, side rails up and locked x2. Call light within reach.

## 2012-06-13 NOTE — Plan of Care (Signed)
Problem: Falls - Risk of  Goal: Absence of falls  Outcome: Met  Free from falls and injury, call light in reach and able to make needs known. Patient encouraged to call for help to the restroom but still refuses at times. Stable upon ambulating but fall risk d/t meds and IV pole.    Problem: Pain - Acute  Goal: Communication of presence of pain  Outcome: Not Met  Patient continues to rate pain 10/10 verbally, patient appears stable and no guarding of abdomin at this time. Continue to use current medication regimen explaining to patient we have to utilize current orders before I page the physician to ask for other/increase in meds. No nausea or vomiting at this time.     Problem: Discharge Planning  Goal: Participation in care planning  Outcome: Not Met  At this time no discharge date planned, not anticipating any home care needs upon discharge at this time.

## 2012-06-13 NOTE — H&P (Addendum)
R3 History and Physical    CC: Abdominal Pain     History of Present Illness:  Ms.  Proctor is a 56 yo F with minimal medical hx only significant for hx of bariatric surgery (Roux-en-y) surgery in 2000 with >200lb weight loss. She presents this evening after four days of acute progressive abdominal pain. Started four days ago in setting of otherwise normal state of health. No known precipitants, exposures (food, travel) or other sick individuals around her. Pain is focal on right side of abdomen, more centrally located right of her umbilicus. +Swelling, focal distention at this site as well and quite tender to the touch. +Nausea and several episodes of vomiting daily (NBNB) and otherwise has had limited tolerance of food, which initially made the pain worse. Also with onset of diarrhea which she describes as "lime green" in color, liquid. +3-4 BM daily for the past two days. Denies melena/hematochezia. +Fever at home >101 yesterday. No chills or diaphoresis. Only other abd surgery was a partial hysterectomy >20 years ago. Has taken tylenol at home for the pain but otherwise has taken no NSAIDS. No new medications or OTC agents including herbal or supplements.     ED course: Vitals: 98.3, HR 69, BP 116/74, RR 16 Sp02 97% RA            Rcvd 6mg  Dilaudid, Percocet x2, Compazine, 2L N/S    Review of Systems: 12 point ROS have been reviewed and are otherwise negative except as stated here or in the HPI      Past Medical History:  Past Medical History   Diagnosis Date   . H/O: hysterectomy    . Gastric bypass status for obesity      Past Surgical History:  Gastric Bypass 2000  Partial Hysterectomy     Home MEDICATIONS  MVI   Vit E     Allergies   Allergen Reactions   . Reglan (ZHY:QMVHQI Dye+Ci Pigment Blue 63+Metoclopramide) Swelling   . Ultram (Tramadol) Swelling   . Zofran (Fd&C Yellow #6 [place]-Ondansetron) Swelling   . Toradol Other     "my throat closes up and tongue swells up"     SOCIAL Hx: Lives with  daughter             Tobacco: 6 Cigarettes daily/ Was 1PPD on/off for ~6yrs              ETOH: None              Illicits: Denies including IVDU     FAMILY Hx: Sister with Breast [place]. No hx of abdominal malignancies              No HTN, DM    Physical Exam:  Vitals: Blood pressure 97/32, pulse 68, temperature 99.4 F (37.4 C), resp. rate 12, weight 84.369 kg (186 lb), SpO2 98.00%.  General: WD/WN female uncomfortable in pain, lying curled up on her side   HEENT: EOMI, PERRL,MMM  Neck: Supple   Lungs: CTAB, no w/r/r, symmetric rise  Heart: RRR, no m/g/r, no heave  Abdomen: Soft, focal swelling/distention right of umbilicus. Acutely tender with light palpation in this area. +Guarding but no rebound. Hypoactive bowel sounds. Unable to tolerate assessment of organomegaly.   Extremities: No LE edema, c/c  Skin: No noted skin rashes, warm, dry  Lymph: no cervical, supraclavicular LAD  Labs  Recent Labs      06/12/12   1504   NA  144   K  3.7   CL  108*   BICARB  27   BUN  13   CREAT  0.91   TP  6.2   ALB  3.6   TBILI  0.2   DBILI  0.1   AST  42*   ALT  48*   ALK  71       Recent Labs      06/12/12   1504   WBC  7.7   HGB  11.8   HCT  35.2   MCV  82.6   PLT  244   SEG  66   LYMPHS  24   MONOS  7   EOS  3     Lipase 28    Lab Results   Component Value Date    COLORUA Yellow 06/12/2012    APPEARUA Clear 06/12/2012    GLUCOSEUA Negative 06/12/2012    BILIUA Negative 06/12/2012    KETONEUA Negative 06/12/2012    SGUA 1.019 06/12/2012    BLOODUA Negative 06/12/2012    PHUA 6.0 06/12/2012    PROTEINUA Trace* 06/12/2012    UROBILUA 0.2-1.0 06/12/2012    NITRITEUA Negative 06/12/2012    LEUKESTUA Small* 06/12/2012    WBCUA 3-5* 06/12/2012    RBCUA 0-2 06/12/2012     EKG: Sinus tachycardia, no ST-Twave changes. Poor quality (Reviewed by myself)       Imaging  Abd Xray (06/12/12):  IMPRESSION:  1. Nonobstructed bowel gas pattern.   2. No evidence of pneumoperitoneum.   3. Radiopaque anastomotic surgical staples and clips project over the left    upper quadrant, likely related to patient's reported gastric bypass.  4. The cardiomediastinal silhouette is unremarkable and the lungs are clear.  5. The regional osseous structures demonstrate no acute abnormalities.   Degenerative changes of the lumbar spine with mild levocurvature.    CT Abd/Pelvis (06/12/12): Prelim read:   Mild bibasal atelectasis.    A 0.8-cm hypoattenuating structure within the hepatic segments 6 (image 47, series 2) which shows contrast filling on the delayed image, likely a hemangioma. Gallbladder surgically absent. There is no evidence of intrahepatic or extrahepatic biliary dilatation.  The common bile duct is mildly dilated, may be post surgical.  However there is no evidence of radiopaque choledocholith within the distal common bile duct.  Pancreas is unremarkable.  Spleen adrenal glands and bilateral kidneys are unremarkable.  Status post Roux-en-Y procedure with expected postsurgical changes.  There is no evidence of enlarged retroperitoneal or mesenteric lymph nodes.  Mild-to-moderate atherosclerotic calcifications of the abdominal aorta.  No evidence of dilated loops of bowel.  There is focal area of fat stranding within the right mid abdominal region and just beneath the right rectus muscle (image 57, series 2), nonspecific and may represent epiglottic appendagitis.  Small amount of pelvic free fluid.  No evidence of acute osseous abnormalities.  Multilevel degenerative changes of the thoracolumbar spine with L4-L5 intervertebral disk vacuum phenomena.    Assessment and Plan: Pt is a 56 yo F with minimal medical hx only significant for hx of bariatric surgery (Roux-en-y) surgery who presents with acute abdominal pain of four days duration.     #Abdominal Pain: Focal pain in rt mid quadrant, periumbilical. Laboratory studies without any suggestion of etiology, including normal white count, lytes, lipase and minimally elevated LFTs. U/A normal. CT scan with only focal findings  consistent with epiploic appendagitis, normally a condition that is self-resolving and managed outside the acute inpatient setting. ED attempted pain  control with several agent, however limited by inability to take NSAIDS as post gastric bypass. Thus far little progress in pain control has been made and patient will need to be admitted for pain control.   - Admit to medicine, Med/Surg level of care   - NPO for now; Gentle IV fluids with LR given relative hypercholoremia   - Compazine PRN for nausea (allergy to zofran)   - Pain control with PCA: Diluadid 0.3mg  Q72mins lockout. Bolus only, no basal rate at this time  - Avoid NSAIDS in this patient with history of bariatric surgery    #S/p gastric bypass: Surgery in 2000. Has had intermittent abd pain and struggled with issues since. Lost >200lbs but at one point <100lbs. Now with stable weight.   - Should resume MVI and Vit E once taking PO   - Will send post bariatric surgery laboratory panel including iron studies, ferritin, Vit B12, lipid profile, PTH, Vit D, Thiamine, Folate, Zinc and Copper  - Pending above w/u would consider more thorough supplementation as needed   - Would benefit from routine care by established PCP    FEN/GI: NPO, will replete lytes PRN  PPx: SQ Heparin   Code Status: FCFT    Pt seen and evaluated with Attending, Dr. Donald Siva, who agrees with the above assessment and plan unless otherwise noted,     Veronda Prude MD, MPH   PGYIII - Internal Medicine     ATTENDING HISTORY AND PHYSICAL ATTESTATION    I agree with the elements of the resident H&P specifically the HPI, PMH, ROS, Social HX, FamilyHX, allergies, meds, assessment and plan with the following additions or exceptions.  I personally performed the service or was physically present during the critical or key portions of the service furnished by the resident.  We discussed these elements at time of admission      Subjective    Chief complaint:  Abdominal pain    History of present  illness:  56 yo woman h/o gastric bypass '00 with 4 days of abdominal pain, nausea, vomiting, diarrhea.  Denies fever, chills. nl WBC, afebrile, CT shows fat stranding in mid abdomen concerning for epiploic appendagitis.  pain uncontrolled in ED. Pain control w dilaudid PCA    See resident history and physical for further details of the patient's history.    Objective    I have examined the patient and concur with the resident exam.    Assessment and Plan    I agree with the resident care plan.    See the resident and physical for further details.

## 2012-06-13 NOTE — Interdisciplinary (Signed)
Received pt  From ED .  Alert  And oriented x 4 .  C/o  abd  Pain .  Started  PCA  With  Dilaudid  0.4 mg , Q  , no  Basal .  Keep  NPO

## 2012-06-13 NOTE — Plan of Care (Signed)
Problem: Falls - Risk of  Goal: Absence of falls  Outcome: Met  Call light within reach,reinstructed to call for assistance.Bed locked in a low position, siderails up x2, brakes  on.Hourly rounds done.Safety measures maintained and room free of clutters/. Ambulates in the room with steady gait but slow.No fall/injury noted.        Problem: Pain - Acute  Goal: Control of acute pain  Outcome: Not Met  C/O of constant abdominal pain  10/10, on Dilaudid PCA 0.4mg  dose with no basal.Appears in no distress, appears comfortable in bed but still rate his pain high. Continue to monitor.    Problem: Discharge Planning  Goal: Participation in care planning  Outcome: Not Met  No dc order yet.NPO maintained and on continuous ivf.To be transferred to tele unit. CN Notified.

## 2012-06-13 NOTE — ED Procedure Note (Signed)
Procedure Note  Central Line  Date/Time: 06/12/2012 9:00 PM  Performed by: Eustace Moore  Authorized by: Flint Melter  Consent: Verbal consent obtained. Written consent obtained.  Risks and benefits: risks, benefits and alternatives were discussed  Consent given by: patient  Patient understanding: patient states understanding of the procedure being performed  Patient consent: the patient's understanding of the procedure matches consent given  Procedure consent: procedure consent matches procedure scheduled  Patient identity confirmed: verbally with patient, arm band, provided demographic data and hospital-assigned identification number  Time out: Immediately prior to procedure a "time out" was called to verify the correct patient, procedure, equipment, support staff and site/side marked as required.  Indications: vascular access  Anesthesia: local infiltration  Local anesthetic: lidocaine 1% with epinephrine  Anesthetic total: 5 ml  Patient sedated: no  Pre-placement Checklist  Hand hygiene by all in room/procedure room: yes  CHG applicator used: yes   30 second scrub and 2 minute dry: yes  Operator wore: sterile gloves, mask with eye shield, sterile gown and hat  Maximum sterile barrier precautions: yes  Sterile field maintained during procedure: Sterile field maintained during procedure  Sterile technique maintained while applying dressing: Yes  CHG impregnated site patch placed: Yes  Dressing Date/Time: 06/12/2012 9:30 PM  Pre-procedure: landmarks identified  Preparation: skin prepped with chlorhexidine  Insertion side: right   Insertion site: jugular  Catheter tip location: Internal jugular vein  Patient position: Trendelenburg  Catheter type: triple lumen  Ultrasound guidance: yes  Ultrasound guide placement: vessel located  Indications for ultrasound: safety    Reason for insertion: new indication  Number of attempts: 2  Successful placement: yes  Estimated Blood Loss: 10 ml  Post-procedure: line  sutured  Assessment: blood return through all parts and placement verified by x-ray  Complications: none  Follow-up: portable chest xray ordered, xray done line in, place ok to use and flush protocol by guideline    Patient tolerance: Patient tolerated the procedure well with no immediate complications.  Comments: Initial attempt in R groin- unable to pass catheter over guidewire. Was aborted and proceeded through IJ.

## 2012-06-13 NOTE — Procedures (Signed)
 PERFORMED ON - 06/13/2012 07:30:00;   DONE BY - CAMAISA, LORNA;  PROCEDURE - PDP EVALUATION;   NEURO STATUS: ALERT AND ORIENTED;   HEARTRATE: 71;   RESP RATE: 15;   ADMITTING DIAGNOSIS: ABDOMINAL PAIN;   MED/RESP/ENVIRON HX: GASTRIC BYPASS 2000, HYSTERECTOMY;   CODE STATUS CHECKED: YES;   CLUBBING PRESENT: NO;   TRACHEOSTOMY PRESENT: NO;   SOB: NO;   CHEST EXCURSION: SYMMETRICAL;   COUGH EFFORT: NONE;   SPUTUM PRODUCTION: NONE;   PRE-TITR FIO2 OR L/M: 21;   PRE-TITR SAO2: 98;   MD REQUEST #1: OXYGEN;   RCP RECOMMENDATION: #1: O2 PROTOCOL D/C;   INDICATION: #1: NOT INDICATED;   EVALUATION OUTCOME: #1: AS RCP RECOMMENDED;   ADVERSE REACTIONS: NONE;   ELECTRONIC SIGNATURE DERIVED FROM A SINGLE CONTROLLED ACCESS PASSWORD:   Jessie Foot ; 06/13/2012 10:04:29

## 2012-06-13 NOTE — Plan of Care (Signed)
Transferred to room 1001 A via w/c in fair condition. Report given to Lindsay,RN before transfer.

## 2012-06-13 NOTE — Plan of Care (Signed)
Blood drawn for labs.

## 2012-06-13 NOTE — Interdisciplinary (Signed)
Pt medication was given per [facility] for abd pain 10/10. 1mg  diluadid was given IV and verified with julie at bed side with patient and nurse. Charge nurse was at bed side and needed to verify due to patient not agreeing with ordered. Patient was talked to by charge nurse and patient verbalized understanding and shown order as well as prepared medication at bed side in front of patient. Pt is now settled in bed.

## 2012-06-13 NOTE — ED Floor Report (Addendum)
Alexandria Proctor is a 56 year old female    Chief Complaint   Patient presents with    Abdominal Pain     Pt with r flank/r back and r abd pain, +n/v, denies changes with urination.        Admitted for ABD AND BACK PAIN    Code Status:  Please refer to In-pt admitting doctors orders     Past Medical History   Diagnosis Date    H/O: hysterectomy     Gastric bypass status for obesity        No past surgical history on file.    Allergies: Reglan; Ultram; Zofran; and Toradol    Level of Care : Medsurg    Fall Risk: no    Skin issues:  no    >> If yes, note areas of skin breakdown. See appropriate photos.      Ambulatory:  yes    Sitter needed: no    Suicide Risk :  no    Isolation Required: no     >> If yes , what type of isolation:    Is patient in custody?  no    Is patient in restraints? no    Is patient on Heparin? no If yes, complete below:   Bolus=    Units      Units/hr   @   Mls/hour      Time of next PT/PTT draw:          Brief Summary of ED Visit (to include focused assessment and neuro status):  56 year old presents with abdominal pain. She said it started 3 days ago, has gotten worse over those 3 days. She describes it as severe in intensity. She describes it as located in the upper abdomen. She is vomiting with each episode and also diarrhea with each episode, pt has not had n/d in last few hours.  Reports diarrhea has been lime green in color. Worse with walking, pt able to ambulate to bathroom, however with much pain. She tried Motrin and Tylenol but brought no relief. She has never had a complication of her gastric bypass  Dilaudid has been given for pain management, pt has minimal relief with Dilaudid, has received multiple doses throughout. Pain cont to be 10/10. Being admitted for pain control r/t epiploic appendigitis. AAOx4. NSR with no cp/sob. No n/v at this time.       See  RN SHIFT ASSESSMENT ADULT  for full assessment, exceptions will be listed.    Cardiac rhythm:  nsr    Oxygen Delivery:  None    Filed Vitals:    06/13/12 0108 06/13/12 0200 06/13/12 0300 06/13/12 0309   BP: 124/102 131/86 97/32    Pulse: 68 72 74 68   Temp:       Resp: 20 11 18 12    Weight:       SpO2: 99% 99% 91% 98%       Lab Results   Component Value Date    WBC 7.7 06/12/2012    RBC 4.26 06/12/2012    HGB 11.8 06/12/2012    HCT 35.2 06/12/2012    MCV 82.6 06/12/2012    MCHC 33.5 06/12/2012    RDW 15.1* 06/12/2012    PLT 244 06/12/2012    MPV 8.5* 06/12/2012       Lab Results   Component Value Date    NA 144 06/12/2012    K 3.7 06/12/2012    CL 108* 06/12/2012  BICARB 27 06/12/2012    BUN 13 06/12/2012    CREAT 0.91 06/12/2012    GLU 88 06/12/2012    [place] 8.7 06/12/2012       No results found for this basename: BNP, PHOS, MG, LACTATE, AMMONIA, IONCA, ARTIONCA       No results found for this basename: CPK, CKMBH, TROPONIN       No results found for this basename: PH, PCO2, O2CONTENT, IVHC3, IVBE, O2SAT, UNPH, UNPCO2, ARTPH, ARTPCO2, ARTO2CNT, IAHC3, IABE, ARTO2SAT, UNAPH, UNAPCO2       No results found for this visit on 06/12/12.    RADIOLOGIC STUDIES NOT COMPLETED: none  (none unless otherwise noted)    Active PICC Line / CVC Line / PIV Line / Drain / Airway / Intraosseous Line / Epidural Line / ART Line / Line Type / Wound    Name: Placement date: Placement time: Site: Days:    CVC Triple Lumen - Large bore temporary Right Internal jugular 06/12/12  2045  Internal jugular  less than 1            Any significant events and interventions with responses:  none      Floor nurse informed that report is ready for review.  Opportunity to answer questions with floor RN face to face or by phone. ER number is [id number] . ER RN Cherene Altes to be contacted for any questions.    Has this report been updated?  no  By Who:   Time:   Additional information by updated user:

## 2012-06-13 NOTE — Plan of Care (Signed)
Problem: Falls - Risk of  Goal: Absence of falls  Outcome: Met    Even  Low  Fall  Risk identified , pt  Had a  PCA pump  .No falls and injury noted  During shift . Fall precaution in place. Keep alarm on in bed , yellow cham in band, non skid socks on , side rails up both , room is free of clutter. Fall assessment completed. Pt aware of calling RN for help and assistance        Problem: Pain - Acute  Goal: Communication of presence of pain  Outcome: Met  Patient communicated for pain control with verbal numerical scale . Encourage patient ask for pain med before discomfort gets worse. Pain assessment completed as regular and PRN. Will continue to monitor.    Started  PCA  With  Dilaudid  0.4 mg , q  And  Without basal. Keep  Close to  monitor    Problem: Discharge Planning  Goal: Participation in care planning  Outcome: Met  Pt calm and cooperative and generally compliant with plan of care . Pt anticipate, will return home when medically cleared for discharge

## 2012-06-13 NOTE — Consults (Signed)
General Surgery CONSULT NOTE    Request for Consultation:   Asked by Anell Barr, MD to evaluate this patient for RLQ abdominal pain    History of Present Illness:     Alexandria Proctor is a 56 year old F w/ PMHx of roux en y bypass in 2000 and subsequent ventral hernia repair, 5 days of acute onset abdominal pain while washing the dishes that has progressed. Now severe RLQ pain. Associated with bilious emesis and diarrhea for 3 days, fevers to 101.2 at home. Pain is worse when up walking around. She has to walk hunched over to be able to tolerate the pain.  Pain doesn't get worse after trying to eat. Denies any recent trauma or falls. No blood in stool.    Past Medical and Surgical History:  Past Medical History   Diagnosis Date   . H/O: hysterectomy    . Gastric bypass status for obesity      PMHx:   H/O obesity    Allergies:  Allergies   Allergen Reactions   . Reglan (ZOX:WRUEAV Dye+Ci Pigment Blue 63+Metoclopramide) Swelling   . Ultram (Tramadol) Swelling     Tolerates Dilaudid   . Zofran (Fd&C Yellow #6 [place]-Ondansetron) Swelling   . Toradol Other     "my throat closes up and tongue swells up"       Prior to Admission Medications:  No prescriptions prior to admission     No prescriptions prior to admission       Current Inpatient Medications:       . [COMPLETED] diphenhydrAMINE  25 mg Once   . [DISCONTINUED] heparin  5,000 Units Q12H   . heparin  5,000 Units Q8H   . [COMPLETED] HYDROmorphone  0.5 mg Once   . [COMPLETED] HYDROmorphone  1 mg Once   . [COMPLETED] HYDROmorphone  1 mg Once   . [COMPLETED] HYDROmorphone  1 mg Once   . [COMPLETED] HYDROmorphone  1 mg Once   . [COMPLETED] HYDROmorphone  1 mg Once   . [COMPLETED] HYDROmorphone  1 mg Once   . HYDROmorphone  2 mg Once   . [DISCONTINUED] ketorolac  30 mg Once   . [DISCONTINUED] naproxen  250 mg Once   . nicotine  1 patch Daily   . [COMPLETED] oxyCODONE-acetaminophen  2 tablet Once   . Patch Removal   Daily   . [COMPLETED] prochlorperazine  10 mg  Once   . sodium chloride (PF)  3 mL Q8H   . [COMPLETED] sodium chloride  1,000 mL Once   . [COMPLETED] sodium chloride  1,000 mL Once          . HYDROmorphone     . lactated ringers 75 mL/hr at 06/13/12 0543   . sodium chloride            . acetaminophen  650 mg Q6H PRN   . diphenhydrAMINE  25 mg Q4H PRN   . [DISCONTINUED] HYDROmorphone  1 mg Q3H PRN   . naloxone  0.1 mg Q2 Min PRN   . prochlorperazine  10 mg Q6H PRN   . sodium chloride (PF)  3 mL PRN   . sodium chloride   Continuous PRN       Social History:  History     Social History   . Marital Status: Single     Spouse Name: N/A     Number of Children: N/A   . Years of Education: N/A     Social History  Main Topics   . Smoking status: Not on file   . Smokeless tobacco: Not on file   . Alcohol Use: Not on file   . Drug Use: Not on file   . Sexually Active: Not on file     Other Topics Concern   . Not on file     Social History Narrative   . No narrative on file       Family History:  No family history on file.    Review of Systems:  As in HOPI  + fevers  Denies CP or SOB  + RLQ abdominal pain, swelling, +nausea/vomiting, + diarrhea    Physical Exam:  Temperature:  [98.2 F (36.8 C)-99.4 F (37.4 C)] 98.2 F (36.8 C) (09/22 0820)  Blood pressure (BP): (96-144)/(32-103) 99/70 mmHg (09/22 0820)  Heart Rate:  [65-121] 72  (09/22 0820)  Respirations:  [11-20] 20  (09/22 0820)  Pain Score:  [-] 10 (09/22 1110)  O2 Device:  [-] Nasal cannula (09/22 0820)  O2 Flow Rate (L/min):  [2 l/min] 2 l/min (09/22 0820)  SpO2:  [91 %-100 %] 96 % (09/22 0820)  Gen: In pain, A+Ox3  Abd: soft, nondistended. Midline incision well healed. Tender to palpation of RLQ only. RLQ with soft tissue edema, mildly warm, no erythema. Point tenderness over area of swelling only. No peritoneal signs or rebound.  BLE: no edema    Labs and Other Data:  Lab Results   Component Value Date    NA 141 06/13/2012    K 3.7 06/13/2012    CL 107 06/13/2012    BICARB 27 06/13/2012    BUN 11 06/13/2012    CREAT  0.86 06/13/2012    GLU 82 06/13/2012    [place] 8.8 06/13/2012     Lab Results   Component Value Date    WBC 6.0 06/13/2012    HGB 11.3 06/13/2012    HCT 34.3 06/13/2012    PLT 175 06/13/2012    SEG 65 06/13/2012    LYMPHS 21 06/13/2012    MONOS 8 06/13/2012    EOS 5 06/13/2012     Lab Results   Component Value Date    AST 35* 06/13/2012    ALT 46* 06/13/2012    ALK 75 06/13/2012    TBILI 0.3 06/13/2012    DBILI 0.1 06/12/2012    TP 6.4 06/13/2012    ALB 3.9 06/13/2012     No results found for this basename: INR, PTT     KUB:  nonobstructed gas pattern with gas in colon    CT abd/pelvis  No evidence of intraperitoneal pathology. Right rectus stranding with overlying soft tissue edema. Per Radiologist is likely fatty infiltration of rectus vs liposarcoma vs subacute hemorrhage    A/P:  56 yo F w/ roux en y in 2000 and subsequent ventral hernia repair now presents with 5d of RLQ pain and swelling, likely rectus muscle pathology  . No evidence of intra-abdominal source of pain or GI symptoms  . No surgical management indicated at this time  . After talking with medical team, patient has another MRN with a CT scan from 2008 that shows the same findings as shown on this CT scan.   . Pt likely has chronic abdominal pain from her prior operations  . Recommend surgical oncology followup for possible liposarcoma due to findings on CT   .  Will follow while inpatient, please call with any change in status    Discussed  w/ Dr. Waymon Amato, MD  PGY-3, General Surgery  Pager [phone]

## 2012-06-13 NOTE — Progress Notes (Addendum)
INTERVAL UPDATE (assuming care of patient)    Briefly, this is a 57F with history of remote Roux en Y in 2000 with subsequent 200 pound weight loss admitted with 4 days of progressive abdominal pain, nausea vomiting.     CT abdomen in ED prelim with "focal area of fat stranding within the right mid abdominal region and just beneath the right rectus muscle (image 57, series 2), nonspecific and may represent epiglottic appendagitis." Started on PCA Dilaudid and LR overnight    Temperature:  [98.2 F (36.8 C)-99.4 F (37.4 C)] 98.2 F (36.8 C) (09/22 0820)  Blood pressure (BP): (96-144)/(32-103) 99/70 mmHg (09/22 0820)  Heart Rate:  [65-121] 72  (09/22 0820)  Respirations:  [11-20] 20  (09/22 0820)  Pain Score:  [-] 10 (09/22 0820)  O2 Device:  [-] Nasal cannula (09/22 0820)  O2 Flow Rate (L/min):  [2 l/min] 2 l/min (09/22 0820)  SpO2:  [91 %-100 %] 96 % (09/22 0820)    Exam: obese female lying in bed, in moderate distress but not tachypneic and breathing comfortably  Abdomen: diffusely tender to palpation + distention + guarding + decreased BS  No peritoneal signs but per resident who examined her this am she is more tender.     A/P 57F with history of remote Roux en Y in 2000 with subsequent 200 pound weight loss admitted with 4 days of progressive abdominal pain, nausea vomiting. Per resident who saw her this AM her exam is worsening despite pain medication, concern for early developing acute abdomen vs mass w/possible bleed.     - Per conversation with Body CT fellow, liposarcoma vs fatty infiltration vs. less likely subacute hematoma, will follow up final read  - MRI abdomen w/ and w/o contrast to better differentiate   - Consult surgery, appreciate recs    Imagene Sheller MD PGY-2  Pager [phone]    Patient seen with Adora Fridge, PGY3    To be discussed with Dr. Ethelene Hal    ATTENDING PROGRESS NOTE ATTESTATION    Subjective    I have reviewed the history.  Interval history:  Patient seen this morning and complaining of  abdominal pain, appeared tearful although would not make direct eye contact.  Instructed to use PCA.  Later patient seen arguing with nursing and patient standing at bedside moving her sheets, non tearful, did not appear in pain.  Upon entering the room the patient immediately flopped on the bed and began writhing in pain and stating PCA not working.      Patient with multiple MRNs [id number] and [id number]    Patient had denied being in hospitalized here in [facility].      Objective    I have examined the patient and concur with the resident exam.    Assessment and Plan    I agree with the resident care plan.  Chronic pain  --Radiology notified of prior imaging, images consistent with old CT  --DC MRI  --DC PCA pump  --Oral pain medications   --resume oral diet  --patient with episode of narcotic seeking behavior in past  --will attempt to get CURES report  See the resident note for further details.

## 2012-06-13 NOTE — Interdisciplinary (Signed)
Patient previously took percocet as ordered for pain, stated it is still 10/10, dilaudid 1mg  IVP ordered for breakthrough pain and given. When administering medication patient asked me to shake the vial of dilaudid, I responded that it does not have to be shaken. She continue to watch me closely as I gave the medication slowly through her line. Patient vitals stable, no acute distress noted, up ambulating in room without difficulty. Will continue to monitor patient closely.

## 2012-06-13 NOTE — Progress Notes (Signed)
Patient with multiple MRNs, see [id number]  Has previous CT from 2008 which was reviewed by Body CT, this is unchanged  DC PCA, will transition to oral medications Percocet 1 tab moderate, 2 tabs severe, IV 1 mg Dilaudid severe pain not relieved by orals  Follow up surg recs which are appreciated  DC MRI per Body CT, appreciate recs

## 2012-06-14 MED ORDER — POTASSIUM CHLORIDE CRYS CR 10 MEQ OR TBCR
20.00 meq | EXTENDED_RELEASE_TABLET | Freq: Once | ORAL | Status: AC
Start: 2012-06-14 — End: 2012-06-14
  Administered 2012-06-14: 20 meq via ORAL
  Filled 2012-06-14: qty 2

## 2012-06-14 MED ORDER — PEG 3350-KCL-NABCB-NACL-NASULF 236 GM OR SOLR
4000.00 mL | Freq: Once | ORAL | Status: AC
Start: 2012-06-14 — End: 2012-06-14
  Administered 2012-06-14: 4000 mL via ORAL
  Filled 2012-06-14: qty 4000

## 2012-06-14 MED ORDER — HYDROMORPHONE HCL 2 MG OR TABS
2.00 mg | ORAL_TABLET | Freq: Once | ORAL | Status: AC
Start: 2012-06-14 — End: 2012-06-14
  Administered 2012-06-14: 2 mg via ORAL

## 2012-06-14 MED ORDER — HYDROMORPHONE HCL 2 MG OR TABS
2.0000 mg | ORAL_TABLET | ORAL | Status: DC | PRN
Start: 2012-06-14 — End: 2012-06-16
  Administered 2012-06-14 – 2012-06-16 (×11): 2 mg via ORAL
  Filled 2012-06-14 (×13): qty 1

## 2012-06-14 NOTE — Interdisciplinary (Signed)
Pt is agitated and impulsive. Pt became very hostile when told it was too soon for her pain medication. Asked to speak with Charge RN who gave her the same information. Pt refuses this RN and only wants to deal with Consulting civil engineer. Has history of refusing RNs during the day shift this am and RN from last night. Sitter at bedside at all times.

## 2012-06-14 NOTE — Plan of Care (Signed)
Problem: Altered safety related to impulsive behavior  Goal: Does not strike out at others or harm self  Outcome: Met  Pt was to be discharged today. Pt did not want to leave and stated she was having suicidal thoughts. Sitter now at bedside at all times. Psych consulted.

## 2012-06-14 NOTE — Consults (Signed)
Psychiatry Resident Consult Note    Date of Admission: 06/13/2012  Consulting Attending: Anell Barr, MD  Reason for Consult: SI    History of Present Illness:     Alexandria Proctor is a 56 year old female with no known psych hx admitted for abdominal pain is consulted for evaluation of SI.     Notably patient is observed to be laughing very loudly and conversing with other staff before this writer approached her for interview.    When approached and greeted patient she stated "you made me nervous. I need to pee." She was allowed this and when she returned she stated that she was sad and crying a lot in last 2 weeks and that she had no plans and hopes for future. Stated that she was living with her daughter and was let go from daughter's house for unknown reasons. Patient then moved to "budget hotel." Reports that daughter would pay for her hotel room but not anymore. Reports unable to care for self and not eating for days prior to hospitalization.     States she does not feel safe getting discharged and that she is willing to try antidepressants. States  "not hurting anymore" will help her feel better. States she is hurting "mentally."    Past Psychiatric History:     1) Diagnoses: Denies  2) Suicide attempts: one SA by OD on meds and alcohol in 2007 in context of her mother's passing. This did not result in hospitalization.   3) Inpatient Hospitalizations: denies  4) Outpatient treatment/psychiatrist: denies  5) Medication Trials: denies    Substance History:  Per chart review:  "Tobacco: 6 Cigarettes daily/ Was 1PPD on/off for ~86yrs   ETOH: None   Illicits: Denies including IVDU"      Medical History:   Past Medical History   Diagnosis Date   . H/O: hysterectomy    . Gastric bypass status for obesity        Allergies:   Allergies   Allergen Reactions   . Reglan (DDU:KGURKY Dye+Ci Pigment Blue 63+Metoclopramide) Swelling   . Ultram (Tramadol) Swelling     Tolerates Dilaudid   . Zofran (Fd&C Yellow #6 [place]-Ondansetron) Swelling   . Toradol Other     "my throat closes up and tongue swells up"       Medications:   Current Facility-Administered Medications   Medication   . acetaminophen (TYLENOL) suppository 650 mg   . heparin injection 5,000 Units   . [COMPLETED] HYDROmorphone (DILAUDID) injection 0.5 mg   . [DISCONTINUED] HYDROmorphone (DILAUDID) injection 1 mg   . [DISCONTINUED] HYDROmorphone (DILAUDID) injection 2 mg   . HYDROmorphone (DILAUDID) tablet 2 mg   . [COMPLETED] HYDROmorphone (DILAUDID) tablet 2 mg   . [EXPIRED] lactated ringers infusion   . naloxone (NARCAN) injection 0.1 mg   . nicotine (NICODERM CQ) 14 MG/24HR patch 1 patch   . oxyCODONE-acetaminophen (PERCOCET) 5-325 MG tablet 1 tablet   . oxyCODONE-acetaminophen (PERCOCET) 5-325 MG tablet 2 tablet   . Patch Removal   . [COMPLETED] PEG 3350 with electrolytes (GoLYTELY) solution 4,000 mL   . [COMPLETED] potassium chloride (KLOR-CON) ER tablet 20 mEq   . prochlorperazine (COMPAZINE) injection 10 mg   . sodium chloride (PF) 0.9 % flush 3 mL   . sodium chloride (PF) 0.9 % flush 3 mL   . sodium chloride 0.9% infusion       Scheduled:       . heparin  5,000 Units Q8H   . [  COMPLETED] HYDROmorphone  0.5 mg Once   . [DISCONTINUED] HYDROmorphone  2 mg Once   . [COMPLETED] HYDROmorphone  2 mg Once   . nicotine  1 patch Daily   . Patch Removal   Daily   . [COMPLETED] PEG 3350 with electrolytes  4,000 mL Once   . [COMPLETED] potassium chloride  20 mEq Once   . sodium chloride (PF)  3 mL Q8H       PRN:       . acetaminophen  650 mg Q6H PRN   . [DISCONTINUED] HYDROmorphone  1 mg Q4H PRN   . HYDROmorphone  2 mg Q4H PRN   . naloxone  0.1 mg Q2 Min PRN   . oxyCODONE-acetaminophen  1 tablet Q4H PRN   . oxyCODONE-acetaminophen  2 tablet Q4H PRN   . prochlorperazine  10 mg Q6H PRN   . sodium chloride (PF)  3 mL PRN   . sodium chloride   Continuous PRN       Social History:  Homeless. No insurance (self pay). Daughter in [place].     Family History:   None  sgnificant                 MSE:   Appearance: 56 yo female in hospital gown, fair grooming  Behavior: cooperatives, interactive. Patient is observed to be laughing loudly and conversing with her staff.   Cognition: alert  Speech: normal   Mood: "I am sad."  Affect: euthymic  Thought Process: Linear and goal directed  Thought Content: no e/o SI, HI, AVH  Insight/Judgment: fair    Pertinent Studies/Labs:   BAL and Utox not done    Assessment:  Alexandria Proctor is a 56 year old female with no known psych hx admitted for abdominal pain is consulted for evaluation of SI.     Patient currently endorses symptoms of MDD including depressed mood, crying spells, SI with plan. Patient however is noted per chart review to be focused on pain medications and requesting IV dilaudid and endorsing 10/10 pain with no objective findings and being comfortable and interactive. She agrees to take antidepressants. But given the discrepancy between her symptom endorsement and presentation when observed by staff, will hold on starting medications at his time.     A comprehensive suicide risk assessment was performed and the patient was assessed to be at a low acute risk of self-harm.  Patient seems to be malingering for pain medications    Modifiable risk factors include precipitating stressors.  Non-modifiable risk factors include previous suicide attempts, older age and single status.  The patient also has protective factors of access to health care.      Axis I: mood NOS, R/o malingering   Axis II: deferred  Axis III: RLQ pain  Axis IV: homelessness, social support, financial  Axis V: GAF 40    Recommendations:  1.  Recommendations are preliminary.  Consult will be staffed with attending within 24 hours.  2.  Will not recommend any antidepressants at this time as her symptoms are inconsistent with her presentation.  3.  Continue with sitter until final recs are made.   4.  Thank you for this consult.  Please page [phone] with any  questions.

## 2012-06-14 NOTE — Interdisciplinary (Signed)
06/14/12 1340   Referral Data   Referral Source Self Referral   Referral Reason Discharge Planning;Mental Health;Counseling/Support  (housing & suicidal ideation)   Patient Information   Primary Caregiver Self   Support System Immediate Family  (formerly lived with adult daughter)   Referral To   [facility];Other (Comment)  (clinic list, mental health resources, shelter list)     INITIAL SOCIAL WORK NOTE: (*Confidential Note- Do Not Reproduce*)    Reason for Referral: SW saw pt, due to pt request for information regarding housing.     Background Information: Pt, Alexandria Proctor, is a 56 year old English-speaking, single African American female admitted on 06/13/2012 for abdominal pain x4 days.      Significant Past Medical History: Pt has history of hysterectomy, gastric bypass for obesity (2000; >200 pounds weight loss).    Social Support and Living Situation: Prior to this admission, pt lived with her adult daughter and her children at [address] [place] [address]. Pt reports that her daughter does not want pt to return to her home after d/c because pt is unable to watch pt's grandchildren at home. Pt reports that this is a difficult task due to her abdominal pain. Pt reports having no previous experience with staying in a shelter, but expressed interest in shelter list. SW to follow up to provide shelter list.     Pt's son-- Alexandria Proctor [phone]-- is listed as Emergency Contact. Pt reports, however, that she has no supportive family who live locally.       Significant Psychosocial Information: Pt reports having diagnosis if bipolar II (2006). She reports that she has no psychiatrist and does not have psych prescriptions. Alexandria Proctor is unable to state where or by whom she was diagnosed with Bipolar. Pt endorses history of both depression and anxiety. She reports current depression, which has lasted for about one week and has been accompanied by suicidal ideation. Pt  reports plan and potential means after discharge. Alexandria Proctor states that she has thought about taking pills to commit suicide, which she would obtain over the counter. SW provided counsel regarding current suicidal ideation and formed a plan of action for active suicidal ideation-- calling access crisis line, police, family, or presenting to crisis house. SW provided mental health information-- access crisis contact, crisis house information. RN and MD informed and sitter placed immediately. Psych consult recommended.     Pt reports history of suicide attempt (2007) in which pt reportedly drank 2 fifths of alcohol and consumed one bottle of pills (pt is unable to name what pills were). Alexandria Proctor states that she was found on the ground after this suicide attempt and brought to the hospital and held there.     Pt denies any history of/current drug or heavy alcohol use. RN reports suspected drug-seeking behavior regarding pain meds.    Financial Situation: Pt is self-pay with pending application. Pt reports having no source of income at this time. Alexandria Proctor states that she was previously living off of settlement money regarding her bariatric surgery in 2000.    Other Relevant Information: Pt's has no PCP. SW to follow up and provide clinic list to determine [facility] facility.    Impressions: At time of visit, pt was sitting upright in bed. She appeared somnolent and reports feelings worried about where she will go after she d/c's. Per RN report, pt appeared anxious about discharging today due to this concern. Pt reports that she is aware of  5150 protocol when pt's present with active suicidal ideation. SW provided counsel regarding SI and making a plan during crisis/acute SI. Pt would likely benefit from psych resources regarding reported bipolar diagnosis and current depressive state.     Pt reports having little social supports, as she claims that she cannot call upon her son or daughter who live locally. SW  encouraged pt to attempt to reconnect with family, which pt feels would not likely bring positive results.     SW will remain available as needed. For further SW needs, please contact unit SW, Alexandria Proctor, MSW.    Action/Plan:   Assessed coping and supports  Provided mental health resources  Follow up with clinic & shelter list  Interfaced with team re: suicidal ideation  Offered supportive counseling  SW to remain available as needed    Alexandria Selena Lesser) Renae Proctor, MSW  Pager [phone]

## 2012-06-14 NOTE — Interdisciplinary (Signed)
06/14/12 1357   Patient Information   Why is Patient in the Hospital? Acute progressive abdominal pain x4days   Prior to Level of Function Use Assisted Devices   Assistive Device Cane   Income Information   Income Source Unemployed   Referral To   Financial Resources Other (Comment)  (unfunded)   Discharge Planning   Living Arrangements Homeless  (was living with daughter but now refused to take her back)   Support Systems Children;Other (See comment);None  (has a son in [place], no support)   Assistance Needed needs mental health, shelter, financial counseling, charity for meds and clinic resources for pcp   Type of Residence Homeless   Home Care Services No   Type of Home Care Services None   Patient expects to be discharged to: shelter   Do you have transportation home?  No   Social Worker Consult   Do you need to see a Child psychotherapist?  Yes   Issues to discuss with social worker?  Homeless resources;Other (comment)  (free clinics, mental health resources, charity, free cane)   Payor: SELF PAY        Regular Pharmacy: none    Met with patient Katriel Warzecha in room 1001/1001A, reported that she used to live with her daughter at [address] [place], [place] [address] but daughter would not take her back. Per SW report she said "she can't come back because she wouldn't look after her grandchildren"; to confirm this with daughter pending patient's consent to call her. She claims she has a son, Kyasha, [place] Home Phone: [phone], but that he lives in [place] and she wants to stay in [place]. She also said that she has no other family or friends here that can take her in.    Patient is ambulatory with the help of a cane, able to perform ADL's independently; denies having been to a SNF, using home health or any other services/equipment; identified as having the following barriers for discharge: unfunded, no support, noncompliant, no PCP, no resources, possible mental health issues, now claiming to be  suicidal; requests the need for a social worker at this time to address the following issues: provide resources for shelters, free clinics, mental health counseling and resources    Patient reports that she is not ready and willing to go home. Patient plans to go to the shelter via transport [facility] provides with need for information and resources as well as a cane.     I have identified the following needs for the patient: SW referral for homeless resources/shelters, info on mental health and free clinics and a freebie cane; financial counseling--patient said she has applied for Medical a week ago, would need f/u, charity meds for the first 30 days. I will continue to follow up with the plan, address all identified needs as appropriate and provide necessary resources to ensure a successful discharge.    Patient had a discharge order for today. However, per SW Nell, patient said she doesn't want to get discharged today and now claims she is suicidal. Psych consult ordered for the patient. Will follow-up. Patient now with a sitter.

## 2012-06-14 NOTE — Consults (Signed)
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   She reports being in her normal state of health until 5 days age, when she was walking and experienced acute onset abdominal pain 'like someone kicked me in the stomach'. The pain is 'stabbing' with radiation to the back. The pain was accompanied by nausea, non-bloody vomiting, and diarrhea. She has been having 6-7 BM's per day which are 'lime green' in color, without blood, and foul smelling. Her symptoms have been progressively worsening over the past five days so she decided to come to the ER.  She reports fever to 100.51F at home, otherwise denies raw food exposure, chang in diet, recent travel, sick contacts.    Current Problem List:   Nausea  Abdominal pain    Past Medical History:  Past Medical History   Diagnosis Date    H/O: hysterectomy     Gastric bypass status for obesity        Past Surgical History:  RYGB  Ventral hernia repair      Social History:  Lives with daughter  Current smoker, 10 pk year history  Denies EtOH, Illicit    Family History:  Sister with breast [place], no fmaily Hc GI malignancy    Allergy:  Allergies   Allergen Reactions    Reglan (ZOX:WRUEAV Dye+Ci Pigment Blue 63+Metoclopramide) Swelling    Ultram (Tramadol) Swelling     Tolerates Dilaudid    Zofran (Fd&C Yellow #6 [place]ndansetron) Swelling    Toradol Other     "my throat closes up and tongue swells up"       Medications:  MVI  Vit E    Review of Systems:  12 point ROS performed with patient, with pertinent positives and negatives as per  HPI.    Physician examination:   BP 92/69   Pulse 65   Temp(Src) 97.8 F (36.6 C)   Resp 14   Ht 5\' 6"  (1.676 m)   Wt 87.998 kg (194 lb)   BMI 31.33 kg/m2   SpO2 100% Body mass index is 31.33 kg/(m^2).  Wt Readings from Last 2 Encounters:   06/13/12 87.998 kg (194 lb)      Blood Pressure   06/14/12 92/69     Temperature:  [97.8 F (36.6 C)-98.7 F (37.1 C)] 97.8 F (36.6 C) (09/23 0600)  Blood pressure (BP): (92-142)/(54-99) 92/69 mmHg (09/23 0758)  Heart Rate:  [65-79] 65  (09/23 0758)  Respirations:  [13-18] 14  (09/23 0758)  Pain Score:  [-] 10 (09/23 1125)  O2 Device:  [-] None (Room air) (09/23 0758)  SpO2:  [97 %-100 %] 100 % (09/23 0758)    General: NAD, comfortable  Eyes: No icterus, PERRLA  ENT: Intact hearing, Oropharynx clear  Cardiovascular: RRR, no edema  Pulm: CTAB, good effort  Gastrointestinal: +BS, soft, tender diffusely, worse in RLQ.  Integumentary: no rashes, warm  Neuro: A&Ox 3, no asterixis  Lymph: no cervical/supraclavicular  Rectal: not examined  MSK: good strength    Labs:  CBC  Lab Results  Component Value Date    WBC 4.8 06/14/2012    HGB 10.7 06/14/2012    HCT 32.4 06/14/2012    MCV 83.3 06/14/2012    PLT 264 06/14/2012     Chemistry  Lab Results   Component Value Date    NA 145 06/14/2012    K 3.4 06/14/2012    CL 106 06/14/2012    BICARB 28 06/14/2012    BUN 11 06/14/2012    CREAT 0.80 06/14/2012    GLU 158 06/14/2012     Liver  Lab Results   Component Value Date    TBILI 0.3 06/13/2012    DBILI 0.1 06/12/2012    AST 35 06/13/2012    ALT 46 06/13/2012    ALK 75 06/13/2012    ALB 3.9 06/13/2012     Coags  No results found for this basename: INR, PTT     Foalte >20  B12 1131  Ferritin 66  Iron/TIBC: 76/357 (21% sat)  Triglyceride 85  Lipase 28    Images:  CT ABD 06/12/12:  LUNG BASES: Groundglass density at the posterior and lateral segment of the right lower lobe.  LIVER/BILIARY: Subcentimeter probable hemangioma, right hepatic lobe.  PANCREAS: Unremarkable  SPLEEN: Unremarkable  ADRENAL GLANDS:  Unremarkable  KIDNEYS: Malrotated right kidney with subcentimeter hypodensity at its upper pole, likely a small cyst  STOMACH/DUODENUM: Surgical change of Roux-en-Y gastric bypass. The excluded portion of stomach and Roux limb do not demonstrate focal abnormality, although bowel is incompletely assessed by CT.  VASCULATURE: Unremarkable  LYMPHATIC: Unremarkable  SMALL & LARGE BOWEL: Unremarkable  BLADDER/PELVIC ORGANS: Uterus is absent.  BONES/SOFT TISSUES: Intramuscular fat and expansion of the right rectus abdominis muscle. Could be related to intramuscular hematoma, although attention on short-term follow-up is advised. Multi-level thoracolumbar spondylosis with grade 1 anterolisthesis of L4 on L5. Subcutaneous gas at the right inguinal region, likely iatrogenic.  OTHER: None    IMPRESSION: CT scan with IV contrast  1.Intramuscular fat and expansion of the right rectus abdominis muscle. Per primary team, patient is also in the PACS system under the name Rivky Clendenning, and was scanned in 2008. Fat at the right rectus appears similar to comparison, and likely represents localized fatty atrophy.  2. Surgical change of Roux-en-Y gastric bypass. The excluded portion of stomach and Roux limb do not demonstrate focal abnormality, although bowel is incompletely assessed by CT.  3. Ground glass density at right lower lobe, likely due to atelectasis, infection less likely.    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     # Abdominal pain, Nausea, Vomiting, Diarrhea: CT imaging without acute findings to explain symptoms. Most likely cause is acute viral gastroenteritis. Other concerns would be for anastomotic ulcer, abdominal wall hematoma.  - Send Stool for C.Diff, Viral culture, Bacterial culture  - Pain management  per primary team  - Anti-emetics per primary team (reports allergy to Zofran and Reglan)  - IVF if not tolerating diet  - Agree with surgical evaluation of possible abdominal wall hematoma/fatty atrophy  - Clears diet now, then NPO except meds at midnight for EGD and Colonoscopy  - Go-lytely 4L PO tonight for colonoscopy prep  - If patient deemed stable for discharge from medicine perspective, then she can have outpatient EGD  and screening colonoscopy  - Since no diarrhea in hospital and lots of anxiety, consider psychiatry consultation. Any contributor to pain can be adhesions. Should do [place] to assess for stones in CBD, very low probability as s/p cholecystectomy    # Transaminemia: mild, possibly related to fatty liver.  - Obtain RUQ [place] to further characterize liver echotexture and assess for fatty infiltration  - Send Hepatitis B surface Ab, Surface Ag, Hepatitis C Ab     Plan to be discussed with attending physician.    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     # Abdominal pain, Nausea, Vomiting, Diarrhea: CT imaging without acute findings to explain symptoms. Most likely cause is acute viral gastroenteritis. Other concerns would be for anastomotic ulcer, abdominal wall hematoma.  - Send Stool for C.Diff, Viral culture, Bacterial culture  - Pain management per primary team  - Anti-emetics per primary team (reports allergy to Zofran and Reglan)  - IVF if not tolerating diet  - Agree with surgical evaluation of possible abdominal wall hematoma/fatty atrophy  - Clears diet now, then NPO except meds at  midnight for EGD and Colonoscopy  - Go-lytely 4L PO tonight for colonoscopy prep  - If patient deemed stable for discharge from medicine perspective, then she can have outpatient EGD and screening colonoscopy  - Since no diarrhea in hospital and lots of anxiety, consider psychiatry consultation. Any contributor to pain can be adhesions. Should do [place] to assess for stones in CBD, very low probability as s/p cholecystectomy    # Transaminemia: mild, possibly related to fatty liver.  - Obtain RUQ [place] to further characterize liver echotexture and assess for fatty infiltration  - Send Hepatitis B surface Ab, Surface Ag, Hepatitis C Ab     - Psychiatry consult  - Consider [place] given slightly abN transaminases  -     See fellow history and physical for further details of the patient's history.    Objective    I have examined the patient and concur with the fellow exam.    Assessment and Plan    I agree with Fellows assessment and plan    See the fellow history and physical for further details.

## 2012-06-14 NOTE — Progress Notes (Addendum)
R2 Interval Update Note    Received page from social worker stating patient endorsing suicidal ideations with plan to overdose on pills.  This is after discussing discharge medication plans and planning on discharging patient later this afternoon with outpatient follow up with GI.  On evaluating patient, patient resting in bed with eyes closed and not in apparent distress. When asked, patient reported that she planned to commit suicide,+ plan to walk in front of a moving vehicle on the street.  Patient states that she has h/o suicide attempts in past where she drank 2 pints of liquor and took "as many prescription pills as possible".  States she has never seen a psychiatrist and was not hospitalized for this incident - was found down by daughter and monitored, was not brought to the hospital. Patient tearful during encounter, seemed comofortable at beginning of conversation.     Of note, CURES report obtained under name Reina Fuse.  Starting in June 2013, patient has obtained Percocet prescription 7 times from 7 different doctors using different addresses including [place], [place], [place], [place], [place], and [place].  CURES report placed in patient chart.    A/P  # Suicidal Ideations  Patient now with suicidal ideations and endorsing plan.  Suspect malingering but will obtain formal psych consult.  - Asked Nursing supervisor to put sitter in room  - Psychiatry consulted, will see patient - appreciate recs.    - Will not change patient's pain medications: percocet PO 2tabs q4 prn, PO Dilaudid 2mg  severe pain.    # Abdominal Pain  GI consulted, if she stays overnight will prep for colonoscopy, EGD and plan for that tomorrow as GI agreeable.    Cristal Generous, DO PGY2

## 2012-06-14 NOTE — Plan of Care (Signed)
Problem: Falls - Risk of  Goal: Absence of falls  Intervention: Nurses will use the fall risk assessment tool to stratify their patient's level of fall risk  Patient was instructed on hourly rounding and to call for assistance for ambulation or transfers from her bed to the bathroom or commode with verbalized understanding. Fall precautions were followed, and detailed discussion of the 6 p's: Pain, Potty, plan for the day, prevention of falls by calling for assistance and hourly rounding. Made sure all patient belongings are within reach and to use the call light if there is anything else he needs to have. Finally, I made sure the postioning of the patient was done every 2 hours as needed to prevent skin breakdown. Call light within reach. Skid socks on with yellow charm in place. Bed alarm on and audible. Tele monitor on and audible.              Problem: Pain - Acute  Goal: Communication of presence of pain  Outcome: Met  Patient complains of abd pain  10/10 with post at asleep. Made aware of pain medication per [facility] and MD orders. Verbalized understanding of using call light when in pain. No s/s hypotension, hypoxia or oversedation prior to or s/p pain med administration. Continuing to monitor and assess.  Pain assessed q 4 hours and PRN and reassessed after interventions. Pt aware of available pain management options and knows to contact nurse if in need of pain medication. Comfort measures continually offered. Encouraged pt to verbalize any pain or discomfort experienced. Will continue hourly rounding to assess pain. Pt is given pain medication with second nurse in room and all caps/wrapper broken in front of patient to verify. Pain orders are explained to patient, but she request to have charge nurse explain pain care plan and regimen, as well as verify the amount of pain medication drawn up in syringe. Pt also go at one time dose of 0.5mg  of dilaudid IV for breakthrough pain one time as MD assessed at bed side  on start of shift.                 Problem: Discharge Planning  Goal: Participation in care planning  Outcome: Not Met  No plans for discharge at this time. Pt is updated daily on plan of care with verbalized understanding. Will continue plan of care at this time per MD orders. Pain management. Stool sample is still needed.         Problem: Tissue Perfusion - Cardiopulmonary, Altered  Goal: Hemodynamic stability  Outcome: Met  Pt on telemetry and continuously monitored. SR, 70-89 normal for pt and asymptomatic, throughout shift. All other VSS. Peripheral pulses palpable, no edema present. Pt denies chest pain and shortness of breath. Encouraged pt to notify nurse if these symptoms arise. Will continue to monitor tele, labs, and meds and assess for acute changes that warrant notification of MD. Blood pressure 119/81, pulse 77, temperature 98.7 F (37.1 C), resp. rate 15, height 5\' 6"  (1.676 m), weight 87.998 kg (194 lb), SpO2 99.00%. on room air and tolerating well.         Problem: Thromboemboli, Risk of  Goal: Absence of signs and symptoms of Thromboemboli  Outcome: Met  Pt assessed for signs of thromboembolism throughout shift; including, but not limited to, positive homans sign, increased skin temperature, unilateral swelling. will continue to monitor. Pt has skid socks on and pt is being repositioned Q2hr. No tenderness, erythema, or increased temp in BLEs.  Pt instructed to call RN if he has sudden CP, SOB and trouble with vision or speaking or one sided weakness. Pt refuses to wear SCD's and states that she gets up out of bed frequently to walk and use the rest room. Re-education is done at bed side with patient with verbalized understanding but still refuses to wear SCD's at this time.               Problem: Skin Integrity- Intact patient high risk for impaired skin integrity Braden scale less than or equal to 18  Goal: Skin remains intact  Outcome: Met  Verbalized understanding of turning every 2 hours.  Heels are floated. Pillow also used help turn patient prn. Skin appears intact. Personal moisturizing lotion at bed side with peri wash.

## 2012-06-14 NOTE — Progress Notes (Signed)
Inpatient Progress Note:     ID: Alexandria Proctor 56 year old female admitted for EPIPLOIC APPENDAGITIS    LOS: 1 day   1001/1001A     Interval Events  No acute events O/N.    Subjectives  Pt reports that abdominal pain has not improved and that pain medications are not working: is hoping to switch back from PO percocet to IV dilaudid. Is now also report pain in her R lower back. She states that it has been present since admission, but her abdominal pain was so sever, she did not report it. Has not had pain in this location or her back before and does not believe it is a strain. Nausea continues, but Compazine is controlling it enough that she does not vomit. Continues to have decreased appetite, but is handling what she does eat. Diarrhea is improving and continues to be non-bloody. Denies fever, chills, night sweats, shortness of breath, chest pain, and pain with urination.     Pain medication over past 24h:   Dilaudid: five 1mg  injections, one 0.5mg  injection (5.5mg  total)  Percocet:  four 2 tablets 5-325    Objectives  Vitals  Temp  Avg: 98.4 F (36.9 C)  Min: 97.8 F (36.6 C)  Max: 98.7 F (37.1 C)  Pulse  Avg: 74.2   Min: 67   Max: 79   BP  Min: 99/70  Max: 142/99  No Data Recorded  Resp  Avg: 16.1   Min: 13   Max: 20   SpO2  Avg: 98.2 %  Min: 96 %  Max: 100 %  SpO2: 100 %       Intake/Output Summary (Last 24 hours) at 06/14/12 1324  Last data filed at 06/13/12 1600   Gross per 24 hour   Intake 783.75 ml   Output    100 ml   Net 683.75 ml       Physical Exam  GEN: lying in bed, NAD, AOx3, pleasant   CV: RRR, nl S1 S2, no m/r/g  PULM: CTAB  ABD: midline scar from bypass surgery, decreased bowel sounds, tenderness to palpation that always refers to focal point 1 inch R of umbilicus, otherwise soft, nd, no HSM   EXT: wwp, no c/c/e       Medications       . [COMPLETED] diphenhydrAMINE  25 mg Once   . heparin  5,000 Units Q8H   . [COMPLETED] HYDROmorphone      . [COMPLETED] HYDROmorphone  0.5 mg Once   .  HYDROmorphone  2 mg Once   . nicotine  1 patch Daily   . Patch Removal   Daily   . sodium chloride (PF)  3 mL Q8H          . acetaminophen  650 mg Q6H PRN   . [DISCONTINUED] diphenhydrAMINE  25 mg Q4H PRN   . HYDROmorphone  1 mg Q4H PRN   . naloxone  0.1 mg Q2 Min PRN   . oxyCODONE-acetaminophen  1 tablet Q4H PRN   . oxyCODONE-acetaminophen  2 tablet Q4H PRN   . prochlorperazine  10 mg Q6H PRN   . sodium chloride (PF)  3 mL PRN   . sodium chloride   Continuous PRN       Labs  Recent Labs      06/12/12   1504  06/13/12   0530  06/13/12   0930   WBC  7.7  6.3  6.0   HGB  11.8  11.1*  11.3   HCT  35.2  34.0  34.3   MCV  82.6  84.0  83.5   PLT  244  267  175   SEG  66  64  65   LYMPHS  24  24  21    MONOS  7  8  8    EOS  3  4  5      Recent Labs      06/12/12   1504  06/13/12   0530  06/13/12   0930   NA  144  143  141   K  3.7  3.6  3.7   CL  108*  107  107   BICARB  27  24  27    BUN  13  11  11    CREAT  0.91  0.87  0.86   [place]  8.7  8.8  8.8   MG   --   2.4   --    PHOS   --   4.1   --    TP  6.2   --   6.4   ALB  3.6   --   3.9   TBILI  0.2   --   0.3   DBILI  0.1   --    --    AST  42*   --   35*   ALT  48*   --   46*   ALK  71   --   75     No results found for this basename: PTT, INR,  in the last 72 hours    9/21 UA: trace protein, small leuk esterase, 3-5 WBCs, moderate squam. Epithelial cells, few mucus    9/21 Lipase: 28  9/22 Lactate: 6.7    9/22 Bowel Nutrition Workup  Iron: 76  UIBC: 281  Ferritin: 55  PTH: 106 (H)  B12: 1131 (H)  Folic Acid: >20 (H)  Zinc: pending  Copper: pending  Vit D: pending    9/22 Lipid Panel  Cholesterol: 158  HDL-Cholesterol: 62  LDL-Chol 79  Triglycerides: 85    Micro  No results found for this basename: BLOODCULT     9/22 Stool labs: pending collection    Imaging  9/22 KUB:   1. Nonobstructed bowel gas pattern.   2. No evidence of pneumoperitoneum.   3. Radiopaque anastomotic surgical staples and clips project over the left   upper quadrant, likely related to patient's reported  gastric bypass.  4. The cardiomediastinal silhouette is unremarkable and the lungs are clear.  5. The regional osseous structures demonstrate no acute abnormalities.   6. Degenerative changes of the lumbar spine with mild levocurvature.    9/22 CXR:  1. Well-expanded, clear lungs. Sharp bilateral costophrenic sulci. No pneumothorax demonstrated.  2. Right jugular central venous catheter with tip projecting over the cavoatrial junction.  3. Normal cardiomediastinal silhouette.  4. Intact regional skeleton.  5. No evidence of acute cardiopulmonary disease.    9/22 CT w/ IV contrast:  1.Intramuscular fat and expansion of the right rectus abdominis muscle. Per primary team, patient is also in the PACS system under the name Kenyetta Pert, and was scanned in 2008. Fat at the right rectus appears similar to   comparison, and likely represents localized fatty atrophy.  2. Surgical change of Roux-en-Y gastric bypass. The excluded portion of stomach and Roux limb do not demonstrate focal abnormality, although bowel is   incompletely assessed by CT.  3.  Ground glass density at right lower lobe, likely due to atelectasis, infection less likely.    ASSESSMENT AND PLAN  56 year old year old female with a history of Roux-en-y surgery in 2000 and subsequent ventral hernia repair who presented with a 4 day history of worsening abdominal pain, anorexia, nausea, bilious vomiting, and non-bloody diarrhea.    #Abdominal pain: Pt reports a focal pain 1 inch to the right of her umbilicus. Pt now afebrile, lipase, lactate, leukocytes, and electrolytes are normal. Only abnormal lab is a slightly elevated AST/ALT. Imaging is only notable for R rectus abdominus pathology (also present on previous CT in 2008), no signs intraabdominal source of pain or GI symptoms. Body CT fellow believes it is liposarcoma vs fatty infiltration vs. less likely subacute hematoma.   -surgery consulted: no surgical intervention at this time, believes pt likely  has chronic abdominal pain from prior operations and recommends surgical oncology f/u for possible liposarcoma finding on CT.  -d/c PCA  -transitioned to PO Percocet 1 tab moderate, 2 tabs severe, PO 2mg  Dilaudid severe pain   -curbsided GI: recommend likely viral/gastroenteritis, will send stool studies, can follow up outpatient for screening, possible endoscopy/colonoscopy    #Post-bariatric surgery lab panel: PTH, B12 and Folic acid are above normal limit (Ca2 and PO4 are normal, however). Iron, UIBC, and ferritin normal. Lipid panel normal.   -f/u Vit D, zinc, copper  -recommend PCP f/u at discharge    #FEN: regular diet    #PPx:  SCD: On     #Dispo: d/c home today pending PO diet and PO tolerance of pain medications, f/u with PCP    #Code Status: Full Code    Note with contributions from: Nolene Bernheim, MS3    R2 Addendum:   Pt seen and examined. Agree with the excellent MS3 note above with appropriate changes made.    Cristal Generous, DO, PGY-2  06/14/2012 11:52 AM      This patient was seen and discussed with Carolena Fairbank, Montine Circle, MD    ATTENDING PROGRESS NOTE ATTESTATION    Subjective    I have reviewed the history.  Interval history:  Patient resting comfortably.  Tolerated breakfast.  No emesis overnight or today.  Asking for EGD or colonoscopy.  Explained that we did not see an urgent reason and that during her last EGD she was unable to be sedated enough to proceed and that likely she would need general anesthesia.  She denies using chronic pain medications or alcohol use.  Explained that her work up has been negative to date in regards to the cause of her abdominal pain.  Nursing documentation reviewed with house staff.    Objective    I have examined the patient and concur with the resident exam.  Was able to press firmly on abdomen with stethescope without signs of pain or patient stating she was having pain.  When touching her abdomen on the right side with slight tough patient moaned in pain.    Assessment and  Plan    I agree with the resident care plan.  Abdominal pain  --CURES report  --avoid narcotics if possible, if suspicious CURES report then would avoid prescribing percocet at discharge  --bentyl for possible cramps  --DC IV pain medications  --GI consult, will need screening colonoscopy that can likely be done as outpatient  --appreciate surgery recommendations.  N/V  --resolved    See the resident note for further details.

## 2012-06-14 NOTE — Discharge Summary (Signed)
Patient Name:  Alexandria Proctor    Date of Admission: 06/13/12  Date of Discharge: 06/16/12    Principal Diagnosis (required):  Abdominal pain, acute    Hospital Problem List (required):  Active Hospital Problems    Diagnosis   . *Abdominal pain, acute suspected to be due to gastroenteritis [789.00]   . Benign neoplasm of colon removed during colonoscopy [211.3]   . Abnormal liver function test [790.6]   . Abdominal Lipoma [214.9]   . Suicide ideation cleared by Psychiatry for Discharge [facility] Problems    Diagnosis   No resolved problems to display.       Additional Hospital Diagnoses ("rule out" or "suspected" diagnoses, etc.):  None    Principal Procedure Performed During This Hospitalization (required):  CT imaging of abd/pelvis  EGD, Colonoscopy 9/24      Other Procedures Performed During This Hospitalization (required):  KUB  CXR    Procedure results are available in Chart Review in Epic.  For those providers external to [facility], the key procedure results are listed below:    9/22 KUB:   1. Nonobstructed bowel gas pattern.   2. No evidence of pneumoperitoneum.   3. Radiopaque anastomotic surgical staples and clips project over the left   upper quadrant, likely related to patient's reported gastric bypass.  4. The cardiomediastinal silhouette is unremarkable and the lungs are clear.  5. The regional osseous structures demonstrate no acute abnormalities.   6. Degenerative changes of the lumbar spine with mild levocurvature.    9/22 CXR:   1. Well-expanded, clear lungs. Sharp bilateral costophrenic sulci. No pneumothorax demonstrated.  2. Right jugular central venous catheter with tip projecting over the cavoatrial junction.  3. Normal cardiomediastinal silhouette.  4. Intact regional skeleton.  5. No evidence of acute cardiopulmonary disease.    9/22 CT w/ IV contrast:   1.Intramuscular fat and expansion of the right rectus abdominis muscle. Per primary team, patient is also in the PACS  system under the name Alexandria Proctor, and was scanned in 2008. Fat at the right rectus appears similar to   comparison, and likely represents localized fatty atrophy.  2. Surgical change of Roux-en-Y gastric bypass. The excluded portion of stomach and Roux limb do not demonstrate focal abnormality, although bowel is   incompletely assessed by CT.  3. Ground glass density at right lower lobe, likely due to atelectasis, infection less likely.    2008 Abdominal CT  1. Unremarkable CT of the abdomen and pelvis with IV contrast.  2. Status-post gastrojejunostomy, cholecystectomy, and hysterectomy.  3. Segment VII hepatic hypoattenuating lesion with indeterminate appearance, most likely a hemangioma.        Consultations Obtained During This Hospitalization:  1. Trauma Surgery  2. Gastroenterology  3. Psychiatry    Key consultant recommendations:  Trauma Surgery:   1. No evidence of intra-abdominal source of pain or GI symptoms   2. No surgical management indicated at this time   3. After talking with medical team, patient has another MRN with a CT scan from 2008 that shows     the same findings as shown on this CT scan.   4. Pt likely has chronic abdominal pain from her prior operations   5. Recommend surgical oncology followup for possible liposarcoma due to findings on CT     Gastroenterology:  CT imaging without acute findings to explain symptoms. Most likely cause is acute viral gastroenteritis. Other concerns would be for anastomotic  ulcer, abdominal wall hematoma.   - Send Stool for C.Diff, Viral culture, Bacterial culture   - Pain management per primary team   - Agree with surgical evaluation of possible abdominal wall hematoma/fatty atrophy   - Per phone conversation with fellow after endoscopy, EGD unremarkable, Colonoscopy revealed "a few" polyps that were removed.  Recommend repeat colonoscopy in <5 years for re-evaluation.  No evidence uncovered during colonoscopy that would explain patient's abdominal  pain.  # Transaminitis: mild, possibly related to fatty liver.   - Obtain RUQ [place] to further characterize liver echotexture and assess for fatty infiltration   - Send Hepatitis B surface Ab, Surface Ag, Hepatitis C Ab       Psychiatry  Impression:   1. Depression NOS   2. Opiod abuse vs dependence   3. Appears to be Malingering for pain medications/shelter   4. A comprehensive suicide risk assessment was performed and the patient was assessed to be at a low acute risk of self-harm. Modifiable risk factors include precipitating stressors. Non-modifiable risk factors include previous suicide attempts and single status. The patient also has protective factors of future life plans and religious beliefs.   Recommendations:   1. Does not require sitter from psychiatric standpoint   2. No indication for antidepressants at this time   3. She declined substance abuse referrals   4. Agree with SW working with her on housing options       Reason for Admission to the Hospital / Initial Presentation:  56 year old year old female with a history of Roux-en-y surgery in 2000 and subsequent ventral hernia repair who presented with a 4 day history of worsening abdominal pain, anorexia, nausea, bilious vomiting, and non-bloody diarrhea.    Hospital Course by Problem (required):    #Abdominal pain: Pt reported focal pain 1 inch to the right of her umbilicus. Fever reported at home, however was afebrile throughout hospital course. Lipase, lactate, leukocytes, and electrolytes were normal. Only abnormal lab was a slightly elevated AST/ALT, which improved throughout her stay. Imaging was only notable for R rectus abdominus pathology (also present and unchanged from previous CT in 2008), no signs intra-abdominal source of pain or GI symptoms were seen. Body CT fellow believes it is liposarcoma vs fatty infiltration vs. less likely subacute hematoma. Surgery consulted: no surgical intervention at this time, believes pt likely has chronic  abdominal pain from prior operations and recommends surgical oncology f/u for possible liposarcoma finding on CT.  Patient started on dilaudid pca and was quickly transitioned to PO regimen: PO Percocet 1 tab moderate, 2 tabs severe, PO 2 mg Dilaudid severe pain.  GI Consulted and noted most likely gastroenteritis vs chronic pain, performed EGD/Colonoscopy 9/24 that was unremarkable except for a few colonic polyps that were excised.  GI also recommended RUQ Ultrasound which can be pursued on an outpatient basis.  Also obtained hepatitis panel which can be followed up as outpatient.       #SI and suspected narcotics abuse: patient endorsed suicidal ideations with plan to overdose on pills. This is after discussing discharge medication plans and planning on discharging patient later this afternoon with outpatient follow up with GI. On evaluating patient, patient resting in bed with eyes closed and not in apparent distress. Of note, CURES report obtained under name Reina Fuse. Starting in June 2013, patient has obtained Percocet prescription 7 times from 7 different doctors using different addresses including [place], [place], [place], [place], [place], and [place]. CURES  report placed in patient chart. Also of note, patient has multiple MRNs ([id number] and [phone]) due to reporting false SSNs and addresses. Under these MRNs, each hospital course is the same: 10/10 GI abdominal pain, vitals/labs/imaging normal, no etiology is found, and the patient requests large amount of narcotics.   -psych recommends not starting anti-depressants now given inconsistency between symptoms and presentation, was cleared by psych for discharge with low acute risk of self harm.  Psychiatry team again re-evaluated patient morning of discharge and confirmed that patient was low risk of self harm, denied SI to them, and was appropriate for discharge.      #S/p gastric bypass: Surgery in 2000. Has had intermittent abd pain and  struggled with issues since. Lost >200lbs but at one point <100lbs. Now with stable weight. No recent PCP f/u, therefore important to check post-bariatric surgery lab panel.  -PTH, B12 and Folic acid are above normal limit (Ca2 and PO4 are normal, however).   -Iron, UIBC, and ferritin normal  -Lipid panel normal  -f/u Vit D, zinc, copper   -recommend PCP f/u at discharge       Tests Outstanding at Discharge Requiring Follow Up:  -Serum Vit D, zinc, copper, hepatitis panel    Discharge Condition (required):  Improved.    Key Physical Exam Findings at Discharge:  Temperature:  [97 F (36.1 C)-98.6 F (37 C)] 97.8 F (36.6 C) (09/24 2310)  Blood pressure (BP): (121-147)/(73-94) 121/87 mmHg (09/24 2310)  Heart Rate:  [67-96] 96  (09/24 2310)  Respirations:  [12-29] 29  (09/24 2310)  Pain Score:  [-] 10 (09/25 0359)  O2 Device:  [-]   SpO2:  [99 %-100 %] 100 % (09/24 2310)    Physical Exam   GEN: lying in bed, NAD, AOx3, pleasant   CV: RRR, nl S1 S2, no m/r/g   PULM: CTAB   ABD: midline scar from bypass surgery, NBS, tenderness to palpation that always refers to focal point 1 inch R of umbilicus, otherwise soft, nd, no HSM   EXT: wwp, no c/c/e     Recent Labs      06/14/12   0650  06/15/12   0640  06/16/12   0500   WBC  4.8  5.4  6.8   HGB  10.7*  11.1*  11.0*   HCT  32.4*  33.4*  32.8*   PLT  264  295  284   MCV  83.3  81.5  82.0   SEG  64  62  70   LYMPHS  22  24  17*      Recent Labs      06/14/12   0650  06/15/12   0640  06/16/12   0500   NA  145  146*  141   K  3.4*  3.9  4.3   CL  106  104  103   BICARB  28  29  28    BUN  11  10  12    CREAT  0.80  0.81  0.81   GLU  158*  60*  68*   [place]  8.7  8.7  8.8   MG  1.9  2.2  2.1   PHOS  3.6  3.5  4.7*         Discharge Diet:  Regular.    Discharge Medications:  Current Discharge Medication List      START taking these medications    Details   oxyCODONE-acetaminophen (PERCOCET) 5-325 MG per  tablet Take 2 tablets by mouth every 4 hours as needed for Severe Pain (Pain Score  7-10).  Qty: 15 tablet, Refills: 0    Associated Diagnoses: Abdominal pain, acute             Allergies:  Allergies   Allergen Reactions   . Reglan (ZOX:WRUEAV Dye+Ci Pigment Blue 63+Metoclopramide) Swelling   . Ultram (Tramadol) Swelling     Tolerates Dilaudid   . Zofran (Fd&C Yellow #6 [place]-Ondansetron) Swelling   . Toradol Other     "my throat closes up and tongue swells up"       Discharge Disposition:  Home.    Discharge Code Status:  Full code / full care  This code status is not changed from the time of admission.    Follow Up Appointments:    Scheduled appointments:  No future appointments.  Encouraged patient to schedule an appointment as soon as possible with [facility] and establish a PCP.  Will also f/u with GI in a month for final tissue readings and f/u on abdominal pain.     For appointments requested for after discharge that have not yet been scheduled, refer to the Post Discharge Referrals section of the After Visit Summary.    Discharging Physician's Contact Information:  [facility] operator at [phone].    Patient seen and d/w attending Dr. Damaris Schooner, DO PGY2

## 2012-06-14 NOTE — Plan of Care (Signed)
Problem: Pain - Acute  Goal: Communication of presence of pain  Patient received percocet t 06 and diluadid at 08.  She fell right asleep after dialudid.     Problem: Tissue Perfusion - Cardiopulmonary, Altered  Goal: Hemodynamic stability   Pt on telemetry and continuously monitored. Sinus Rhythm VSS. Peripheral pulses palpable and strong; Trace Pitting  edema present. Pt denies chest pain and shortness of breath. Encouraged pt to notify nurse if these symptoms arise. Will continue to monitor tele, labs, and meds and assess for acute changes that warrant notification of MD.

## 2012-06-14 NOTE — Plan of Care (Signed)
Problem: Falls - Risk of  Goal: Absence of falls  Outcome: Met  patient is ambulating without difficulty off the floor and to the bathroom.      Problem: Pain - Acute  Goal: Communication of presence of pain  Had a discussion with patient about her pain control.  Called Primary team to coordinate adequate pain control.  Patient was able to laugh, talk and leave the floor to smoke twice (without permission) while complaining of 10/10 pain.   Psych consult was performed and patient states she would like to start antidepressants.  I asked her if she is getting any relief from her pain meds and she stated emphatically,  "yes".  However she was unclear why her pain was always rated 10/10.      Problem: Discharge Planning  Goal: Participation in care planning  Outcome: Not Met    I told her earlier that she was going home today and her response was " please not till tomorrow".  I assured her that it would be today and asked if she had a ride home.  She told me she did not have a ride and needed a safe place to live.  SW went into the room to talk to her, she stated she had suicidal ideation. Patient will stay another day and have a psych eval f/u tomorrow am with Dr. Bland Span.      Problem: Tissue Perfusion - Cardiopulmonary, Altered  Goal: Hemodynamic stability   Pt on telemetry and continuously monitored. SR VSS. Peripheral pulses palpable and strong; TRedema present. Pt denies chest pain and shortness of breath. Encouraged pt to notify nurse if these symptoms arise. Will continue to monitor tele, labs, and meds and assess for acute changes that warrant notification of MD.          Problem: Skin Integrity- Intact patient high risk for impaired skin integrity Braden scale less than or equal to 18  Goal: Skin remains intact  Outcome: Met  Patient was instructed on hourly rounding and to call for assistance for ambulation or transfers from his bed to the bathroom or commode.  Fall precautions were followed, and detailed  discussion of the 6 p's:  Pain, Potty, plan for the day, prevention of falls by calling for assistance and hourly rounding.  Made sure all patient belongings are within reach and to use the call light if there is anything else he needs to have.  Finally, I made sure the postioning of the patient was done every 2 hours as needed to prevent skin breakdown.

## 2012-06-14 NOTE — Plan of Care (Signed)
Problem: Falls - Risk of  Goal: Absence of falls  Outcome: Met  Hourly rounding performed on shift for pt and all needs met. Pt upper side rails raised. Pt educated and understands use of call light when needing to get oob or any assistance. Non skid socks on feet. SItter at bedside. Pt remained free from falls or injury throughout entire shift.     Problem: Pain - Acute  Goal: Communication of presence of pain  Outcome: Met  Pt understands pain scale and how to communicate when experiencing any acute pain. Pt pain assessed q 4 hours and PRN and reassessed after interventions. Pt is aware of interventions available for pain management. Pt is 10/10 pain at all times even after pain medications and in reassessments. PRN pain medication given as much as allotted. MD aware of pt pain levels and max pain medications as well.     Problem: Tissue Perfusion - Cardiopulmonary, Altered  Goal: Hemodynamic stability  Outcome: Met  Pt is on telemetry and monitored constantly. Vitals assessed q 4 hours as ordered.  BP and HR stayed WNL and stable throughout shift. Pt Is Sinus rhythm. No Chest pain noted on shift.

## 2012-06-15 MED ORDER — PROMETHAZINE HCL 25 MG/ML IJ SOLN
6.25 mg | Freq: Once | INTRAMUSCULAR | Status: AC
Start: 2012-06-15 — End: 2012-06-15
  Administered 2012-06-15: 6.25 mg via INTRAVENOUS
  Filled 2012-06-15: qty 0.25

## 2012-06-15 NOTE — Plan of Care (Signed)
Problem: Falls - Risk of  Goal: Absence of falls  Outcome: Met  Fall and safety precautions are in place,patient is up with steady gait, patient is informed to call prn for assistance to which patient has verbalized compliance with. Bed is locked, side rails up x 2, patient has call light within reach, will continue to monitor patient.    Problem: Pain - Acute  Goal: Communication of presence of pain  Outcome: Not Met  Patient is medicated for pain to abdomen through out day with ordered pain medication upon reassessment patient is verbalizing that pain is"not relieved" , patient is asking for pain medication before its due, sleeps most of day, MD is made aware, will continue to monitor patient.    Problem: Discharge Planning  Goal: Participation in care planning  Outcome: Met  Patient is being updated on POC prn, patient has no current discharge orders to discuss, will continue to monitor patient.    Problem: Tissue Perfusion - Cardiopulmonary, Altered  Goal: Hemodynamic stability  Outcome: Met  Patient's VSS, patient has no c/o chest pain, no edema noted. Patient is on tele monitor, showing NSR. Will continue to monitor patient.    Problem: Thromboemboli, Risk of  Goal: Absence of signs and symptoms of Thromboemboli  Outcome: Met  Patient has no s/sx of clots noted, patient is mobile in and out bed, patient refused  wearing SCD, will continue to monitor patient.    Problem: Skin Integrity- Intact patient high risk for impaired skin integrity Braden scale less than or equal to 18  Goal: Skin remains intact  Outcome: Met  Pressure ulcer prevention measures are in place, patient is able and encouraged to reposition Q 2 hours at least and prn. Patient's skin and nutrition assessed Q shift and prn. Hills are off loaded with pillows, will continue to monitor patient.    Problem: Altered safety related to impulsive behavior  Goal: Does not strike out at others or harm self  Outcome: Met  patient is verbalzing intent to  harm self with plan, by standing in front of moving vehicle"  First call MD Dr. Nancy Nordmann is text paged at [phone] to inform. Dr. Nancy Nordmann has called back and verbalized understanding and following "aware of (suicide) plan, cleared by Pysch, will not do anything further at this time". Verified MD conversation, patient has sitter at bedside for safety monitoring, will continue to monitor patient closely.

## 2012-06-15 NOTE — Interdisciplinary (Signed)
PCP identification:    Pt does not have a medical home, she has been to "free clinics in the past" I have referred her to the clinic at [facility] and she tells me she will follow up there for any medical needs.  She has been provided with mental health and homeless resources.

## 2012-06-15 NOTE — Interdisciplinary (Signed)
"  Patient is asking for pain medication, none due till 1437, also asking for antinausea, none due  until 1538. Also patient is verbalzing intent to harm self with plan, by standing in front of moving vehicle" First call MD Dr. Nancy Nordmann is text paged at [phone] to inform. Dr. Nancy Nordmann has called back and verbalized understanding and following "Will order Zofran, no more pain medication until EGD and aware of (suicide) plan, cleared by Pysch, will not do anything further at this time". Verified MD conversation, patient has sitter at bedside for safety monitoring, will continue to monitor patient closely.

## 2012-06-15 NOTE — Consults (Signed)
Psychiatry Attending Consult Note    Consulting Attending: Val Riles., MD  Reason for Consult: suicide risk assessment     I have discussed the case with the resident physician and have examined the patient:  See resident note for details.  Notable in history, Alexandria Proctor is a 56 year old female with a history of opiod abuse vs dependence (per [facility]ES report - patient has obtained Percocet prescription 7 times from 7 different doctors using different addresses in last 4 months) admitted for abdominal pain.  Patient noted to be opiate seeking and when team was attempting to d/c her she became tearful and had suicidal thoughts.  She was kept in the hospital for psych evaluation and has EGD today.  This morning she is bright and cheerful.  She reports she is newly homeless because her daughter doesn't want her staying there.  She reports that she has been calling the shelters off the list that SW provided.  She reports she has been feeling sad, but denies suicidal thoughts.  Patient states she can maintain safety here in the hospital.         MSE:  Appearance/Behavior: well groomed, full make up, friendly   Speech: wnl   Mood: "sad"   Affect: euthymic  Thought Process: linear    Thought Content: no evidence of SI, HI, AVH  Cognition: alert, oriented  Insight/Judgment: limited     Impression:   1. Depression NOS  2. Opiod abuse vs dependence  3. Appears to be Malingering for pain medications/shelter  4. A comprehensive suicide risk assessment was performed and the patient was assessed to be at a low acute risk of self-harm. Modifiable risk factors include precipitating stressors.  Non-modifiable risk factors include previous suicide attempts and single status.  The patient also has protective factors of future life plans and religious beliefs.      Recommendations:  1.  Does not require sitter from psychiatric standpoint  2.  No indication for antidepressants at this time  3.  She declined substance abuse  referrals  4.  Agree with SW working with her on housing options  5. Thank you for this consult. Please page [phone] with any questions.

## 2012-06-15 NOTE — Plan of Care (Signed)
Problem: Falls - Risk of  Goal: Absence of falls  Fall assessment completed q shift, pt is a moderate fall risk. Sitter at bedside to assist pt to ambulate as needed. Pt frequently refuses help and ambulates steady on feet and without physical assistance. Hourly rounding completed. No falls or injury noted during shift.        Problem: Pain - Acute  Goal: Communication of presence of pain  Pt able to communicate presence of pain using verbal scale, rating pain 0-10. Pt c/o both back and abdominal pain at times. When asked to describe pain level, pt consistently reports 10/10. Overnight, pt sleeping when reassessed. Alternating percocet and dilaudid PO doses as pt frequently asks for additional pain medication before next dose is due. Per report and MD notes, team aware of this situation.  Will continue to monitor for signs of distress or discomfort.        Problem: Tissue Perfusion - Cardiopulmonary, Altered  Goal: Hemodynamic stability  Vital signs monitored q 4hr; wdl. SR per telemetry monitor. BP 143/94  Pulse 80  Temp(Src) 97.1 F (36.2 C)  Resp 18  Ht 5\' 6"  (1.676 m)  Wt 87.998 kg (194 lb)  BMI 31.33 kg/m2  SpO2 99%  Pt denies chest pain or sob.  Extremities appear perfused, warm to touch with positive peripheral pulses.   Will continue to monitor.        Problem: Thromboemboli, Risk of  Goal: Absence of signs and symptoms of Thromboemboli  Pt assessed for signs of thromboembolism throughout shift; including, but not limited to, positive homans sign, increased skin temperature, unilateral swelling. None noted or reported by patient overnight. Pt receiving subQ heparin doses and ambulating in room. Will continue to monitor.        Problem: Skin Integrity- Intact patient high risk for impaired skin integrity Braden scale less than or equal to 18  Goal: Skin remains intact  Braden scale assessment completed q shift. Pt score 22. Pt able to turn self in bed and ambulates in room. No skin breakdown noted.

## 2012-06-15 NOTE — Plan of Care (Signed)
Problem: Altered safety related to impulsive behavior  Goal: Does not strike out at others or harm self  Per report, today pt expressed suicidal ideation with plan. Sitter remains at bedside throughout shift. No evidence of any plan to harm self noted by staff overnight. Will continue to monitor directly.

## 2012-06-15 NOTE — Interdisciplinary (Signed)
Patient is taken via gurney to GI at which time patient has no s/sx of any distress noted.

## 2012-06-15 NOTE — Progress Notes (Addendum)
R1 Inpatient Progress Note:     ID: Alexandria Proctor 56 year old female admitted for EPIPLOIC APPENDAGITIS    LOS: 2 days   1001/1001A     Interval Events  Patient expressed SI. Evaluated by psych. Given pt is now staying O/N, was placed NPO in preparation for endoscopy/colonoscopy. No acute events O/N.     Subjectives  Stomach and back pain persist, no improvement from yesterday. Did not endorse SI. Looking forward to finding out the results from the endoscopy/colonoscopy. Reports that she has been drinking the GoLytely and that her stool is now clear. Denies fever, chills, shortness of breath, chest pain, and pain with urination.    Pain medication over past 24h:   6 Dilaudid 2 mg (12 mg total)  5 Percocet 5-325 mg (25-1625 mg total)    Objectives  Vitals  Temp  Avg: 97.6 F (36.4 C)  Min: 96.5 F (35.8 C)  Max: 98.4 F (36.9 C)  Pulse  Avg: 74.4   Min: 59   Max: 88   BP  Min: 128/94  Max: 147/84  No Data Recorded  Resp  Avg: 19.9   Min: 17   Max: 27   SpO2  Avg: 99.2 %  Min: 99 %  Max: 100 %  SpO2: 99 %       Intake/Output Summary (Last 24 hours) at 06/15/12 1610  Last data filed at 06/15/12 9604   Gross per 24 hour   Intake   2369 ml   Output      0 ml   Net   2369 ml       Physical Exam  GEN: lying in bed, NAD, AOx3, pleasant   CV: RRR, nl S1 S2, no m/r/g   PULM: CTAB   ABD: midline scar from bypass surgery, NBS, tenderness to palpation that always refers to focal point 1 inch R of umbilicus, otherwise soft, nd, no HSM   EXT: wwp, no c/c/e     Medications       . heparin  5,000 Units Q8H   . nicotine  1 patch Daily   . Patch Removal   Daily   . [COMPLETED] PEG 3350 with electrolytes  4,000 mL Once   . [COMPLETED] potassium chloride  20 mEq Once   . sodium chloride (PF)  3 mL Q8H          . acetaminophen  650 mg Q6H PRN   . HYDROmorphone  2 mg Q4H PRN   . naloxone  0.1 mg Q2 Min PRN   . oxyCODONE-acetaminophen  1 tablet Q4H PRN   . oxyCODONE-acetaminophen  2 tablet Q4H PRN   . prochlorperazine  10 mg Q6H  PRN   . sodium chloride (PF)  3 mL PRN   . sodium chloride   Continuous PRN       Labs  Recent Labs      06/13/12   0930  06/14/12   0650  06/15/12   0640   WBC  6.0  4.8  5.4   HGB  11.3  10.7*  11.1*   HCT  34.3  32.4*  33.4*   MCV  83.5  83.3  81.5   PLT  175  264  295   SEG  65  64  62   LYMPHS  21  22  24    MONOS  8  7  7    EOS  5  7  7      Recent Labs  06/12/12   1504  06/13/12   0530  06/13/12   0930  06/14/12   0650  06/15/12   0640   NA  144  143  141  145  146*   K  3.7  3.6  3.7  3.4*  3.9   CL  108*  107  107  106  104   BICARB  27  24  27  28  29    BUN  13  11  11  11  10    CREAT  0.91  0.87  0.86  0.80  0.81   [place]  8.7  8.8  8.8  8.7  8.7   MG   --   2.4   --   1.9  2.2   PHOS   --   4.1   --   3.6  3.5   TP  6.2   --   6.4   --    --    ALB  3.6   --   3.9   --    --    TBILI  0.2   --   0.3   --    --    DBILI  0.1   --    --    --    --    AST  42*   --   35*   --    --    ALT  48*   --   46*   --    --    ALK  71   --   75   --    --      9/21 UA: trace protein, small leuk esterase, 3-5 WBCs, moderate squam. Epithelial cells, few mucus     9/21 Lipase: 28   9/22 Lactate: 6.7     9/22 Bowel Nutrition Workup   Iron: 76   UIBC: 281   Ferritin: 55   PTH: 106 (H)   B12: 1131 (H)   Folic Acid: >20 (H)   Zinc: pending   Copper: pending   Vit D: pending     9/22 Lipid Panel   Cholesterol: 158   HDL-Cholesterol: 62   LDL-Chol 79   Triglycerides: 85     Micro  Lab Results   Component Value Date    BLOODCULT No Growth after 24 hour/s of incubation. 06/13/2012    BLOODCULT No Growth after 24 hour/s of incubation. 06/13/2012     9/21 Urine Cx: mixed urethral flora    Imaging   9/22 KUB:   1. Nonobstructed bowel gas pattern.   2. No evidence of pneumoperitoneum.   3. Radiopaque anastomotic surgical staples and clips project over the left   upper quadrant, likely related to patient's reported gastric bypass.  4. The cardiomediastinal silhouette is unremarkable and the lungs are clear.  5. The regional osseous  structures demonstrate no acute abnormalities.   6. Degenerative changes of the lumbar spine with mild levocurvature.    9/22 CXR:   1. Well-expanded, clear lungs. Sharp bilateral costophrenic sulci. No pneumothorax demonstrated.  2. Right jugular central venous catheter with tip projecting over the cavoatrial junction.  3. Normal cardiomediastinal silhouette.  4. Intact regional skeleton.  5. No evidence of acute cardiopulmonary disease.    9/22 CT w/ IV contrast:   1.Intramuscular fat and expansion of the right rectus abdominis muscle. Per primary team, patient is also in the PACS system under the name Alexandria Proctor,  and was scanned in 2008. Fat at the right rectus appears similar to   comparison, and likely represents localized fatty atrophy.  2. Surgical change of Roux-en-Y gastric bypass. The excluded portion of stomach and Roux limb do not demonstrate focal abnormality, although bowel is   incompletely assessed by CT.  3. Ground glass density at right lower lobe, likely due to atelectasis, infection less likely.      ASSESSMENT AND PLAN  56 year old year old female with a history of Roux-en-y surgery in 2000 and subsequent ventral hernia repair who presented with a 4 day history of worsening abdominal pain, anorexia, nausea, bilious vomiting, and non-bloody diarrhea. Yesterday upon discharge, patient expressed SI, prompting a psych evaluation and the decision to keep her overnight. Placed NPO for endo/colonoscopy today. Notably, patient has multiple MRNs due to reporting of different SSNs and addresses. Each MRN documents    #Abdominal pain: Pt reports a focal pain 1 inch to the right of her umbilicus. Pt afebrile, lipase, lactate, leukocytes, and electrolytes are normal. Only abnormal lab is a slightly elevated AST/ALT. Imaging is only notable for R rectus abdominus pathology (also present on previous CT in 2008), and shows no signs intraabdominal source of pain or GI symptoms. Body CT fellow believes it is  liposarcoma vs fatty infiltration vs. less likely subacute hematoma.   - given patient is staying O/N after expressing SI, pt was placed NPO and prepped for a     endoscopy/colonscopy today  - surgery consulted: no surgical intervention at this time, believes pt likely has chronic abdominal   pain from prior operations and recommends surgical oncology f/u for possible liposarcoma finding   on CT.    - continue PO Percocet 1 tab moderate, 2 tabs severe, PO 2mg  Dilaudid severe pain   - can discharge on 15 tabs of percocet  - curbsided GI: recommend likely viral/gastroenteritis, will send stool studie      #SI and suspected narcotics abuse: patient endorsing suicidal ideations with plan to overdose on pills. This is after discussing discharge medication plans and planning on discharging patient later this afternoon with outpatient follow up with GI. On evaluating patient, patient resting in bed with eyes closed and not in apparent distress. Of note, CURES report obtained under name Alexandria Proctor. Starting in June 2013, patient has obtained Percocet prescription 7 times from 7 different doctors using different addresses including [place], [place], [place], [place], [place], and [place]. CURES report placed in patient chart. Also of note, patient has multiple MRNs ([id number] and [phone]) due to reporting false SSNs and addresses. Under these MRNs, each hospital course is the same: 10/10 GI abdominal pain, vitals/labs/imaging normal, no etiology is found, and the patient requests large amount of narcotics.   -psych recommends not starting anti-depressants now given inconsistency between symptoms and presentation and further evaluation today by the attending  - stop sitter - cleared by psych, getting outpatient housing via social worker    #Transaminitis: GI believes it is possibly related to fatty liver. Recommends the following:  - Obtain RUQ [place] as outpatient to further characterize liver echotexture and assess  for fatty infiltration   - Order Hepatitis B surface Ab, Surface Ag, Hepatitis C Ab, f/u up results as an outpatient    #Post-bariatric surgery lab panel: PTH, B12 and Folic acid are above normal limit (Ca2 and PO4 are normal, however). Iron, UIBC, and ferritin normal. Lipid panel normal.   -f/u Vit D, zinc, copper  -recommend  PCP f/u at discharge     #FEN: regular diet     #PPx: SCD: On     #Dispo: d/c home today pending completion of EGD/Colonoscopy f/u with PCP     #Code Status: Full Code     Note with contributions from: Nolene Bernheim, MS3    This patient was seen and discussed with Val Riles., MD    R2 Addendum:   Pt seen and examined. Agree with the excellent MS3 note above with appropriate changes made.    Cristal Generous, DO, PGY-2  06/15/2012 1:37 PM        Medicine Attending    Abdominal pain persists.  No N/V.    Afeb  120-140/80's  60-80  17  99%RA  NAD  Abdomen lower periumbilic tenderness without distention, no rebound/guarding, normal bowel sounds    Labs as noted above    Abdominal pain - await EGD/colo, pain control as above  Abnormal LFT's - abdominal U/S, hepatitis serologies  Suicidal ideation - cleared by psychiatry

## 2012-06-15 NOTE — Interdisciplinary (Signed)
"  Patient is asking for water and ice chips despite NPO status and is asking for more pain medication" is sent to first call MD at #[phone]. Patient is also asking to speak with charge nurse regarding "not getting enough pain medication", Leah, charge nurse is made aware. Patient has been given ordered pain medication when due, patient is asking for pain medication before its due, which each time patient was informed at what time, she can have pain medication in addition to writing time medication is due on communication board. Dr. Nancy Nordmann and team on the floor at this time are in to see patient and Tacey Ruiz, charge RN has also seen patient.

## 2012-06-15 NOTE — Interdisciplinary (Addendum)
0050 pt awake, ambulates to bathroom. Pt voids and has bm in toilet, allows RN to see it. Unable to have full view d/t toilet paper in toilet, but light brown color in toilet. Pt then continues to drink more golytely that remains at bedside.     0200 pt continues to drink golytely. Per MD ok to continue to drink mixed with peach iced tea crystal light despite NPO status. Pt calm and cooperative with staff at this time.     During report this am, pt noted to be drinking golytely with red coloring in it. Pt aware she is not to mix anything red into golytely and refuses to say where the mixture came from. MD notified and aware of situation. Report given to RN Joan Flores to follow up with plan of care today.

## 2012-06-16 MED ORDER — OXYCODONE-ACETAMINOPHEN 5-325 MG OR TABS
2.0000 | ORAL_TABLET | ORAL | Status: DC | PRN
Start: 2012-06-16 — End: 2018-01-05

## 2012-06-16 NOTE — Interdisciplinary (Signed)
Informed by primary nurse pt needing assistance with her co-pay for her OP meds ( $10), Spoke with Selena Batten SW and will assist with this.

## 2012-06-16 NOTE — Plan of Care (Signed)
Problem: Falls - Risk of  Goal: Absence of falls  Outcome: Met  Fall precautions observed. Pt can ambulate steadily on her own, with sitter at bedside to assist pt. Hourly rounding done to check pt's comfort and safety. No falls occur.

## 2012-06-16 NOTE — Plan of Care (Signed)
Problem: Falls - Risk of  Goal: Absence of falls  Outcome: Met  Safety measures enforced, bed at lowest level, call light in reach, oob to bathroom with steady gait, call light in reach, pt free from fall and injury. Seen by the psychiatric team, no new orders done, made aware that pt verbalized that she's still hearing things to end her life.  No sitter order made, pt stable,for d/c today. Pt requested to be seen by Dr. Nancy Nordmann, seen, then requested to be seen by social worker.    Problem: Pain - Acute  Goal: Communication of presence of pain  Assessed for pain and discomfort, medicated for pain[ see mar], cont monitored.    Problem: Tissue Perfusion - Cardiopulmonary, Altered  Goal: Hemodynamic stability  Outcome: Met  V/s monitored, pt hemodynamically stable, for d/c today.

## 2012-06-16 NOTE — Plan of Care (Signed)
Problem: Pain - Acute  Goal: Communication of presence of pain  Assessed for pain. Pt can communicate presene of pain throught designated scales. Pt verbalized pain 10/10 at all times, verbalized pain meds given doesn't help. On call doctor aware, no change in medications made.  Medicated alternately with Dilaudid and Percocet. Pt was able to sleep for 2 hrs straight overnight, dose on and off in between pain meds. Will continue to monitor.    Problem: Tissue Perfusion - Cardiopulmonary, Altered  Goal: Hemodynamic stability  Vital signs stable, nontele. No complain of chest pain or shortness of breath.    Problem: Thromboemboli, Risk of  Goal: Absence of signs and symptoms of Thromboemboli  Outcome: Met  {t is on Heparin sq for dvt prophylaxis. Pt ambulated regularly ans changed position in bed. No signs and symptoms of thromboemboli formation noted.

## 2012-06-16 NOTE — Plan of Care (Signed)
Problem: Discharge Planning  Goal: Participation in care planning  Outcome: Met  Discharge instructions given, right neck IJ removed by md, dressing to right neck intact, no bleeding noted.  Discharge instructions given,reinforced importance of compliance to clinic appt as a follow up visit post d/c. Bus pass given by Selena Batten, narcotic prescription form given to pt , to be taken at the pharmacy. Pt refused to sign d/c instructions, stated she's waiting for lunch to be served.

## 2012-06-16 NOTE — Discharge Instructions (Signed)
Diagnosis and Reason for Admission    You were admitted to the hospital for the following reason(s):  Abdominal Pain    Your full diagnosis list is located on this After Visit Summary in the Hospital Problems section.    What Happened During Your Hospital Stay    The main tests and treatments done for you during this hospitalization were:    Abdominal CT  Abdominal Xray  Colonoscopy and Upper Endoscopy    The following evaluation is still important to complete after discharge from the hospital:  F/u with PCP and with Gastroenterology re: Hepatitis panel that is still pending.  Your GI doctors recommended you follow up with a repeat colonoscopy within 5 years as a few polyps were removed.     Instructions for After Discharge    Your diet at home should be a low-fat diet.    Your activity level at home should be:  as much exercise or activity as you can tolerate.    Specific activity restrictions:    Do not drive while taking narcotic pain medications.    Wound or tube care instructions:  None    Your medication list is located on this After Visit Summary in the Current Discharge Medication List section.  Your nurse will review this information with you before you leave the hospital.    It is very important for you to keep a current medication list with you in order to assist your doctors with your medical care.  Bring this After Visit Summary with you to your follow up appointments.         Reasons to Contact a Doctor Urgently    Call 911 or return to the hospital immediately if:  Worsening shortness of breath, severe chest pain, severe nausea and vomiting with blood, cannot tolerate an oral diet for more than a day, severe abdominal pain, severe leg swelling      You should contact either your primary care physician or your hospital physician for any of the following reasons:  worsening shortness of breath, chest pain, abdominal pain, bloody bowel movements, hypoglycemia, increasing pain in lower  extremities      Your hospital physician at the time of hospital discharge is Nani Skillern.    Your physicians strive to explain things in a way you can understand.  If you have any questions or concerns about your hospital care or your medications, your hospital physician can be contacted in the following manner:  [facility] operator at [phone].    New problems and symptoms unrelated to your hospitalization are best addressed by your primary outpatient physician (PCP), as are requests for medication refills or appointment referrals after hospital discharge.  However, if new issues arise before you can see or contact your PCP, your hospital physician may be able to assist with these issues during this time.    What Needs to Happen Next After Discharge -- Appointments and Follow Up    Any appointments already scheduled at [facility] will be listed in the Future Appointments section at the top of this After Visit Summary.      Sometimes tests performed in the hospital do not yet have results by the time a patient goes home.  The following key tests will need to be followed up at your next appointment: Hepatitis serologies.  We recommend that your PCP order an ultrasound of your abdomen    Medical Home Information    Your primary care provider or clinic currently  on file at [facility] is: [facility]    If we are assigning you a new medical home for your follow up after discharge, that information will appear here:       Specific timing of follow-up:  Within 1-2 weeks of discharge    Additional Discharge Information (if applicable)            Handouts Given to You (if applicable)    Abdominal Pain    You have been diagnosed with abdominal (belly) pain. The cause of your pain is not yet known.    Many things can cause abdominal pain. Examples include viral infections and bowel (intestine) spasms. You might need another examination or more tests to find out why you have pain.    At this time,  your pain does not seem to be caused by anything dangerous. You do not need surgery. You do not need to stay in the hospital.     Though we don't believe your condition is dangerous right now, it is important to be careful. Sometimes a problem that seems mild can become serious later. This is why it is very important that you return here or go to the nearest Emergency Department unless you are 100% improved.    YOU SHOULD SEEK MEDICAL ATTENTION IMMEDIATELY, EITHER HERE OR AT THE NEAREST EMERGENCY DEPARTMENT, IF ANY OF THE FOLLOWING OCCURS:   Your pain does not go away or gets worse.   You cannot keep fluids down or your vomit is dark green.    You vomit blood or see blood in your stool. Blood might be bright red or dark red. It can also be black and look like tar.   You have a fever or shaking chills.   Your skin or eyes look yellow or your urine looks brown.   You have severe diarrhea.

## 2012-06-16 NOTE — Plan of Care (Signed)
Problem: Thromboemboli, Risk of  Goal: Absence of signs and symptoms of Thromboemboli  Outcome: Met  No s/s of thromboemboli noted.

## 2012-06-16 NOTE — Progress Notes (Signed)
Psychiatry  Progress Note    ID: Alexandria Proctor is a 56 year old female with no known psych hx admitted for abdominal pain is consulted for evaluation of SI.    S: The patient was asleep when approached, and was interviewed with the resident.  States her mood as "good", and that she is waiting for her test results to return to see if she needs a gastric bypass surgery or not.  If no surgery, the patient's plan after discharge would be to go to her friends house ([facility]) in [place], [place].  No other complaints at this time.     O:   Meds:       . heparin  5,000 Units Q8H   . nicotine  1 patch Daily   . Patch Removal   Daily   . [COMPLETED] promethazine (PHENERGAN) IVPB  6.25 mg Once   . sodium chloride (PF)  3 mL Q8H          . acetaminophen  650 mg Q6H PRN   . HYDROmorphone  2 mg Q4H PRN   . naloxone  0.1 mg Q2 Min PRN   . oxyCODONE-acetaminophen  1 tablet Q4H PRN   . oxyCODONE-acetaminophen  2 tablet Q4H PRN   . prochlorperazine  10 mg Q6H PRN   . sodium chloride (PF)  3 mL PRN   . sodium chloride   Continuous PRN       Vitals:  BP 121/87  Pulse 96  Temp(Src) 98 F (36.7 C)  Resp 29  Ht 5\' 6"  (1.676 m)  Wt 87.998 kg (194 lb)  BMI 31.33 kg/m2  SpO2 100%    Administered PRN meds in the last 24 hours:   - 2mg  tab hydromorphone X 6 on 9/24, x1 on 9/25  -Percocet 5-325mg  X5 on 9/24, X02 9/25  -Compazine 10mg  X3 on 9/24, X1 on 9/25  New Labs/Studies:  none    Mental Status Examination:  Appearance/Behavior: No acute distress, appears stated age, AOx3.  Cooperative, good eye contact.  Speech: Normal rate, rhythm, tone, volume.    Mood: "good" 2/2 wanting to leave   Affect: full, mood congruent  Thought Process: Linear, goal oriented    Thought Content:  No SI/HI, weighing options on how to get to friend in [place].  Cognition: AOx3  Insight/Judgment: Good.  Realizes that it might take a while to obtain enough money to go to [place], and plans on getting in touch with some contacts to help her out  with that.    Impression:   1. Depression NOS   2. R/o malingering for pain medications  3. Opioid abuse vs dependence    Recommendations:  - Patient wants to be discharged from the hospital, is very determined to move to [place] and is hopefull of the future.  No SI/HI.  - Patient can be discharged when medically cleared.  - Case discussed with attending Dr. Bland Span.  Please call [phone] with any questions.    Note cowritten with Nilda Riggs MS3

## 2012-06-16 NOTE — Interdisciplinary (Signed)
Social Work Follow up:    Pt is requesting to speak with Social Work prior to her discharge, she is asking appropriate questions about the function and specific service of various shelters in town.  She plans to go to [facility] with long term goal of obtaining a bed at either [facility] or [facility].  She understands how to use 211, will look into shelter beds through [facility] in [place].  I provided her with bus pass and assistance with DC medication as discussed with RN CM and CC lead.

## 2012-06-16 NOTE — Plan of Care (Signed)
Problem: Altered safety related to impulsive behavior  Goal: Does not strike out at others or harm self  Outcome: Met  Seen by the psychiatric team , sitter not renewed, pt stable, no untoward s/s noted,

## 2012-06-16 NOTE — Interdisciplinary (Signed)
Talked to Dr Nancy Nordmann regarding pt's suicidal ideation. According to Dr Nancy Nordmann, pt feels safe when she's at the hospital and doesn't have feel like killing herself. Told doctor that pt is saying differently to the nurse. That pt keeps on hearing voices telling her to kill herself and she's thinking of commiting suicide eventhough she's in the hospital. Dr Nancy Nordmann said that pt is not suicidal when he talked to her and that pt is safe even without a sitter. Requested video surveillance but there's no available. Morning nurse aware.

## 2012-06-16 NOTE — Plan of Care (Signed)
Problem: Discharge Planning  Goal: Participation in care planning  Outcome: Met  Pt was given discharge instructions x 2, but refused to sign the d/c form. Pt left the unit at 1335 hrs, social worker Selena Batten gave $10 for the co pay.

## 2012-06-16 NOTE — Interdisciplinary (Signed)
Reported to oncall Psych (pager [phone]) that pt is still hearing voices telling her to commit suicide. She thinks of going out on the freeway so she can be hit by a moving vehicle. On call doctor said that when they talked to the pt, pt said that she feels safe when she's in the hospital.She doesn't think of committing suicide as long as she doesn't get discharged. Doctor will ask psych to evaluate pt this morning. SAsked if sitter can be reordered, but was told that pt doesn't need sitter.

## 2012-06-16 NOTE — Plan of Care (Signed)
Problem: Altered safety related to impulsive behavior  Goal: Does not strike out at others or harm self  Outcome: Met  Per report today, pt verbalized suicidal ideation with plan, with sitter at bedside. Will continue to monitor.

## 2012-06-16 NOTE — Plan of Care (Signed)
Problem: Pain - Acute  Goal: Communication of presence of pain  Outcome: Not Met  Pt with on and off abdominal pain, medicated with dilaudid 2 mg p.o. At 1027 for 91/0 pain level, assessed in 2 hrs pain level decreased to 2/10, although it was scanned prior to admission, for some reason it was not listed on the e mar, pharmacist made aware. Pt was ready for discharge, discharge instructions was already given at noon time, pt continuously has multiple requests and demands, requested for food at the dietary, spoke with Md and the social worker Selena Batten, Seen buy md as well as Selena Batten, was given a bus pass. Pt refused to sign d/c papers. Pt left the unit accompanied by pt's escort via wheelchair in a stable condition. Denied suicidal ideation . Verbalzed understanding odf the importance of compliance to meds and clinic appt.

## 2012-06-16 NOTE — Progress Notes (Addendum)
Brief R2 Interval Note    Assessed patient this morning, patient discussed goals to establish care with PCP and was glad to hear her EGD and colonoscopy results were benign.  Did not endorse SI or HI to me.  Per psychiatry yesterday, patient clear for discharge and low risk of self harm and expressions of SI may be due to attempts at secondary gain (pain medication/shelter). Will remove R neck IJ today and plan for discharge home today as patient has no further medical indications for hospitalization. See discharge summary for further details.    Cristal Generous, DO PGY2    Med Attending    Patient feeling better, pain reasonably controlled, tolerating PO's.  EGD benign, will need 5 yr f/u for colo with polyps (and also will need to f/u path).  Pt to establish care at [facility].  Plan d/c today.  Rx for Percocet given, #15.

## 2012-06-16 NOTE — Procedures (Signed)
Alexandria Proctor is a 56 year old female patient.  1. Epiploic appendagitis (558.9)    2. Abdominal pain, acute (789.00)    3. Suicide ideation (V62.84)    4. Abnormal liver function test (790.6)    5. Abdominal pain, acute suspected to be due to gastroenteritis (789.00)    6. Suicide ideation cleared by Psychiatry for Discharge (V62.84)    7. Benign neoplasm of colon removed during colonoscopy (211.3)      Past Medical History   Diagnosis Date   . H/O: hysterectomy    . Gastric bypass status for obesity      Blood pressure 121/87, pulse 96, temperature 98 F (36.7 C), resp. rate 29, height 5\' 6"  (1.676 m), weight 87.998 kg (194 lb), SpO2 100.00%.    central line removal  Date/Time: 06/16/2012 8:26 AM  Performed by: Jeral Fruit  Authorized by: Val Riles  Consent: Verbal consent obtained.  Risks and benefits: risks, benefits and alternatives were discussed  Consent given by: patient  Patient understanding: patient states understanding of the procedure being performed  Local anesthesia used: no  Patient sedated: no  Patient tolerance: Patient tolerated the procedure well with no immediate complications.  Comments: Patient placed in trendelenberg position and exhaled and hummed while line removed.  Excision site covered with sterile gauze and covered with tegederm.  Patient tolerated well, clear breath sounds after, and instructed to remove dressing after one day.          Cristal Generous, DO PGY2  06/16/2012

## 2012-06-17 ENCOUNTER — Encounter (HOSPITAL_COMMUNITY): Payer: Self-pay

## 2012-06-17 MED ORDER — LORAZEPAM 2 MG/ML IJ SOLN
1.00 mg | Freq: Once | INTRAMUSCULAR | Status: AC
Start: 2012-06-17 — End: 2012-06-17
  Administered 2012-06-17: 1 mg via INTRAVENOUS
  Filled 2012-06-17: qty 1

## 2012-06-17 MED ORDER — DIPHENHYDRAMINE HCL 12.5 MG/5ML OR ELIX
50.0000 mg | ORAL_SOLUTION | Freq: Four times a day (QID) | ORAL | Status: DC | PRN
Start: 2012-06-17 — End: 2012-06-17

## 2012-06-17 MED ORDER — ACETAMINOPHEN 325 MG PO TABS
975.00 mg | ORAL_TABLET | Freq: Once | ORAL | Status: AC
Start: 2012-06-17 — End: 2012-06-17
  Administered 2012-06-17: 975 mg via ORAL
  Filled 2012-06-17: qty 3

## 2012-06-17 MED ORDER — LORAZEPAM 2 MG/ML IJ SOLN
2.00 mg | Freq: Once | INTRAMUSCULAR | Status: AC
Start: 2012-06-17 — End: 2012-06-17
  Administered 2012-06-17: 2 mg via INTRAVENOUS
  Filled 2012-06-17: qty 1

## 2012-06-17 MED ORDER — OLANZAPINE ODT 5 MG OR TBDP
10.00 mg | ORAL_TABLET | Freq: Once | ORAL | Status: AC
Start: 2012-06-17 — End: 2012-06-17
  Administered 2012-06-17: 10 mg via ORAL
  Filled 2012-06-17: qty 2

## 2012-06-17 MED ORDER — DIPHENHYDRAMINE HCL 50 MG/ML IJ SOLN
50.0000 mg | Freq: Once | INTRAMUSCULAR | Status: DC
Start: 2012-06-17 — End: 2012-06-17
  Filled 2012-06-17: qty 50

## 2012-06-17 MED ORDER — DIPHENHYDRAMINE HCL 50 MG/ML IJ SOLN
50.00 mg | Freq: Once | INTRAMUSCULAR | Status: AC
Start: 2012-06-17 — End: 2012-06-17
  Administered 2012-06-17: 50 mg via INTRAMUSCULAR

## 2012-06-17 MED ORDER — HALOPERIDOL LACTATE 5 MG/ML IJ SOLN
5.00 mg | Freq: Once | INTRAMUSCULAR | Status: AC
Start: 2012-06-17 — End: 2012-06-17
  Administered 2012-06-17: 5 mg via INTRAMUSCULAR
  Filled 2012-06-17: qty 1

## 2012-06-17 NOTE — ED Provider Notes (Signed)
Emergency Department Note    CC :   Chief Complaint   Patient presents with   . Psychiatric Problem     als from [facility]. depressed x 2 weeks. voices telling her to jump into traffic       HPI :   55 year old  female no pmh presents with SI. She wants throw herself in front of traffic. Pt w/o psych hx, on no psych meds. Pt with previous attmpts in past yrs ago. No medical hx, on no medication. Pt has no medical complaints.       Past Medical History :   Past Medical History   Diagnosis Date   . Manic depression    . Bipolar 1 disorder    . Schizophrenia        Past Surgical history :   No past surgical history on file.      Medications:  Patient's Medications    No medications on file       Allergies :  Tramadol and Zofran    Review of Systems:   Constitutional: Negative for fever, chills.   Respiratory: Negative for cough and shortness of breath.    Cardiovascular: Negative for chest pain and leg swelling.   Gastrointestinal: Negative for nausea, vomiting, abdominal pain and diarrhea.   Musculoskeletal: Negative for myalgias, back pain and arthralgias.   Neurological: Negative for dizziness, weakness, light-headedness and headaches.  Ambulation at baseline.        Physical Exam:  Filed Vitals:    06/17/12 1530   BP: 122/90   Pulse: 80   Temp: 97.9 F (36.6 C)   Resp: 20   SpO2: 99%     Nursing note and vitals reviewed.  Constitutional: The patient is oriented to person, place, and time and appears well-developed and well-nourished. No distress.   Cardiovascular: Regular rhythm, normal heart sounds. Exam reveals no gallop and no friction rub. No murmur heard.  Pulmonary/Chest: Effort normal and breath sounds normal. No respiratory distress. No wheezes, rhonchi, or rales . No chest wall tenderness.   Abdominal: Soft. No distension and no masses. There is no tenderness.   Neuro: CNs 2-12 intact, mentation appropriate, steady gait.     Labs:  Results for orders placed in visit on 03/18/07   URINALYSIS        Result Value Range    UA        Color YELLW      Appearance CLEAR      Specific Gravity 1.025  1.002 - 1.030    pH 5.5  5.0 - 8.0    Protein 1+ (*) NEGATIVE    Glucose NEG  NEGATIVE    Ketones NEG  NEGATIVE    Bilirubin NEG  NEGATIVE    Blood NEG  NEGATIVE    Urobilinogen 0.2-1  0.2-1EU/DL    Nitrite NEG  NEGATIVE    Leuk Esterase SMALL (*) NEGATIVE    WBC 21-50 (*) 0-2 /HPF    RBC 0-2  0-2 /HPF    Mucus RARE  RARE   LIPASE, BLOOD       Result Value Range    Lipase 20 (*) 22 - 51 unit/L   LIVER PANEL, BLOOD       Result Value Range    AST (SGOT) 27  10 - 45 IU/L    ALT (SGPT) 29  10 - 45 IU/L    Alkaline Phos 68  30 - 130 IU/L  Total Protein 6.1  6.0 - 8.0 gm/dL    Albumin 3.3  3.3 - 5.0 gm/dL    Bilirubin, Dir <8.1  <0.2 mg/dL    Bilirubin, Tot 0.4  <1.2 mg/dL   BASIC METABOLIC PANEL, BLOOD       Result Value Range    Glucose 58 (*) 65 - 110 mg/dL    BUN 14  8 - 18 mg/dL    Creatinine 0.7  0.5 - 1.5 mg/dL    GFR >19      GFR (African Amer.) >60      Sodium 145  135 - 145 mEq/L    Potassium 3.5  3.5 - 5.0 mEq/L    Chloride 105  97 - 107 mEq/L    Bicarbonate 28  24 - 31 mEq/L    Calcium 8.8  8.8 - 10.3 mg/dL   CBC WITH ADIFF, BLOOD       Result Value Range    WBC 7.3  4.0 - 11.0 1000/mm3    RBC 4.21  4.00 - 5.00 mill/mm3    Hgb 11.7 (*) 12.0 - 16.0 gm/dL    Hct 14.7 (*) 82.9 - 46.0 %    MCV 82.8  82.0 - 98.0 um3    MCH 27.8  27 - 31 pgm    MCHC 33.5  32 - 37 %    RDW 15.2 (*) 10 - 15 %    Plt Count 348  130 - 400 1000/mm3    MPV 7.0 (*) 7.4 - 10.4 fl    Segs 59  45 - 70 %    Lymphocytes 24  20 - 40 %    Monocytes 11 (*) 1 - 10 %    Eosinophils 5 (*) 1 - 3 %    Basophils 1  0 - 2 %    Diff Type AUTO           Diagnostic studies:  No results found for this visit on 06/17/12.  No orders of the defined types were placed in this encounter.         ED Course & Clinical Decision Making:  The patient presents with suicidal ideation without Auditory and visual hallucinations.   I have reviewed the patient's past medical  history and determined that none of their medical problems are affecting their current state.     I have asked psychiatry to see the pt to make medication and disposition recommendations. The pt is taking all thier medications as prescribed and as listed in this chart's medication list.     Psych has seen the pt and recommended: [facility].     I have discussed my thought process and the above recommendation with the attending, Dr. Dimas Aguas.     Melody Comas, MD (PGY2)              Rosie Fate, MD  Resident  06/18/12 1200

## 2012-06-17 NOTE — ED Attending Note (Signed)
ED ATTENDING NOTE:      History of Present illness  Alexandria Proctor is a 56 year old female with SI, wants to walk in front of traffic, +AH, no VH, or HI.     Past Medical History   Diagnosis Date   . Manic depression    . Bipolar 1 disorder    . Schizophrenia        No past surgical history on file.    No current facility-administered medications on file prior to encounter.     No current outpatient prescriptions on file prior to encounter.       No family history on file.    History     Social History   . Marital Status: Single     Spouse Name: N/A     Number of Children: N/A   . Years of Education: N/A     Occupational History   . Not on file.     Social History Main Topics   . Smoking status: Not on file   . Smokeless tobacco: Not on file   . Alcohol Use: Not on file   . Drug Use: Not on file   . Sexually Active: Not on file     Other Topics Concern   . Not on file     Social History Narrative   . No narrative on file       Review of Systems  Constitutional: Negative for fever and chills.   HENT: Negative for neck pain and neck stiffness.   Eyes: Negative for photophobia, pain and redness.   Respiratory: Negative for choking.   Cardiovascular: Negative for chest pain.   Gastrointestinal: Negative for abdominal pain.   Genitourinary: Negative for dysuria.   Musculoskeletal: Negative for back pain and gait problem.   Neurological: Negative for seizures.   Hematological: Negative for adenopathy.   Psychiatric/Behavioral: Negative for agitation.      Physical Exam  Filed Vitals:    06/17/12 1530   BP: 122/90   Pulse: 80   Temp: 97.9 F (36.6 C)   Resp: 20   SpO2: 99%        GA: NC/AT, NAD, No Respiratory Distress  HEENT: EOMI, PERRL, Sclera White, No JVD, No Stridor  Neck: Supple, no midline TTP  Chest: CTAB, no W/R/R, S1,S2  Abdomen: Soft, NT, ND no R/G  Ext: No C/C/E  Neuro: CN II-XII grossly intact, Motor 5/5 in all 4 extremities, A+O=3    Labs Reviewed   URINE IMMUNOASSAY DRUG SCREEN        Imaging    Impression  Alexandria Proctor is a 56 year old female with SI    Medical Decision Making  SI, anxiety, psychosis, mania    Plan  UA, UDS, psych consult.   I have discussed this patient with the resident and we have agreed on a plan for the workup, and management of this case.

## 2012-06-17 NOTE — ED Notes (Signed)
Meal tray provided.

## 2012-06-17 NOTE — ED Notes (Signed)
Pt acting aggressive, threatening rn. Pt states "I will go through you", "just you try and stop me". Pt trying to coerce rn to go around the corner so that patient can assault rn. rn educated pt. that security will be called and patient will be put in restraints if patient attempts violence. Er md made aware and ordered medication for patient. Security at standby. Sitter at bedside. Patient medicated per mar.

## 2012-06-17 NOTE — Progress Notes (Signed)
Psychiatry Resident Consult Note    Date of Admission: 06/17/2012  Consulting Attending: Ailene Ravel, MD  Reason for Consult: SI    History of Present Illness:     Alexandria Proctor is a 56 year old female with no self admitted psych history admitted for evaluation of SI.  Spoke to the patient who stated she was suicidal due to being "tired of all the pain". She stated she was angry that nobody was letting her leave.  She repaetedly stated that she would kill herself and that it was "her life", and then refused to talk further with the team.    Past Psychiatric History:     1) Diagnoses: Denies, chart review indicates that she was dx with manic depression, bipolar 1, and schizophrenia.   2) Suicide attempts: one SA by OD on meds and alcohol in 2007 in context of her mother's passing. This did not result in hospitalization.   3) Inpatient Hospitalizations: denies   4) Outpatient treatment/psychiatrist: denies   5) Medication Trials: denies    Substance History:  Per chart review:   "Tobacco: 6 Cigarettes daily/ Was 1PPD on/off for ~58yrs   ETOH: None   Illicits: Denies including IVDU"      Medical History: The following autopopulates from previous history, however patient denies any past psychiatric diagnoses and states she has never been on any psychiatric medication in her whole life.  Past Medical History   Diagnosis Date   . Manic depression    . Bipolar 1 disorder    . Schizophrenia    -Hysterectomy    Allergies:     Allergen   Reactions   .   Reglan (UVO:ZDGUYQ Dye+Ci Pigment Blue 63+Metoclopramide)   Swelling   .   Ultram (Tramadol)   Swelling   Tolerates Dilaudid   .   Zofran (Fd&C Yellow #6 [place]-Ondansetron)   Swelling   .   Toradol   Other   "my throat closes up and tongue swells up"       Medications:   No current facility-administered medications for this encounter.     No current outpatient prescriptions on file.       Scheduled:            PRN:       . [DISCONTINUED] diphenhydrAMINE  50 mg Q6H PRN     <!--RTF_E-->       Social History: (from this author's consult note on 06/14/12 listed under MR number [id number])  Homeless. No insurance (self pay). Daughter in [place].       Family History:   None significant                 MSE:   Appearance: disheveled  Behavior: aggitated, good eye contact  Cognition: AOx3  Speech: normal rate, rhythm and tone.   Mood: "depressed"   Affect: full  Thought Process:linear, goal oriented    Thought Content: SI, states she hears voices telling her that it would not hurt that bad to kill herself by throwing herself in traffic  Insight/Judgment: Poor.  Does not realize why she was taken to the ER when all she wanted to do was kill herself.     Pertinent Studies/Labs:   none    Assessment:  Alexandria Proctor is a 56 year old female with no self admitted psych history admitted for evaluation of SI. Patient currently endorses SI with plan.     A comprehensive suicide risk assessment  was performed and the patient was assessed to be at a intermediate acute risk of self-harm.      Modifiable risk factors include suicidality (manifested by suicidal ideation, establishment of a plan and suicidal intent).  Non-modifiable risk factors include previous suicide attempts, existing psychiatric diagnoses, older age and single status.  The patient also has protective factors of access to health care.      Axis I: mood NOS, SI  Axis II: deferred   Axis III: ?Pain  Axis IV: homelessness, social support, financial   Axis V: GAF 25      Recommendations:  1. Patient meets criteria for inpatient hospitalization at this time.  Patient willing to come in voluntarily.  Will admit to [facility] once medically stable.    2.  Thank you for this consult.  Please page [phone] or call [facility] with any questions.

## 2012-06-17 NOTE — ED Notes (Signed)
Pt c/o back pain. Tylenol given. siderails up x 2. Sitter at bedside

## 2012-06-17 NOTE — ED Notes (Signed)
Patient signed out to this MD by MD Rogelia Rohrer.    Patient is a 56 year old female  here for SI  Admit WW      Durenda Age, MD  Resident  06/17/12 [phone]

## 2012-06-17 NOTE — ED Notes (Signed)
Meal tray ordered 

## 2012-06-17 NOTE — ED Notes (Signed)
Pt sleeping at this time, sitter remains at bedside will continue to assess

## 2012-06-17 NOTE — ED Notes (Signed)
PT expressing desire to leave stating "I had my sandwich and I am not hungry anymore, so I am ready to go".

## 2012-06-17 NOTE — Consults (Signed)
Brief Psychiatry ED Consult Note:    Patient was seen and assessed by me.  In brief, this is a 56yo female with past history of admissions for pain meds with no objective findings, d/c'd from medicine service yesterday, self-presenting today with SI and a plan to "jump into traffic".  Highly suspect patient is malingering for housing and pain medications, however she has been violent in ED, disruptive, is currently heavily sedated.  Also concern that once sedation wanes, she is at high risk of impulsive behavior at this time if discharged directly from ED.  Will bring patient into [facility] for further assessment and evaluation overnight with final plan pending attending eval tomorrow.    Please see H&P for suicide risk assessment and further details.  Thank you.

## 2012-06-17 NOTE — ED Notes (Addendum)
si precautions oberved. Checkered armband in place.
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
## 2012-06-17 NOTE — ED Notes (Signed)
Urine sample obtained and sent to the lab.

## 2012-06-17 NOTE — ED Notes (Signed)
PT's belongings (3 bags) placed in ED closet.

## 2012-06-17 NOTE — ED Notes (Signed)
Report from [facility], care assumed at this time. Pt sitting up in bed eating at this time, sitter remains with pt. Pt cooperative at this time

## 2012-06-17 NOTE — Procedures (Signed)
Report Author:  Dolores Frame. Constance Holster, M.D.    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    INSTRUMENTS:    MEDICATIONS: Demerol  25 mg IVP, Versed 2 mg IVP  NEED FOR ANESTHESIA:Moderate sedation    The attending physician, Dr.Christan Defranco [facility], was present for  the entire examination.    PROCEDURE TECHNIQUE: A evaluation was performed.  Informed consent was obtained from the patient after  explaining all the risks (perforation, bleeding, infection,  adverse effects to the medicine, missed lesion(s), and tooth  damage), benefits and alternatives to the procedure which  the patient appeared to understand and so stated.  The  patient was connected to the monitoring devices and  placed in the left lateral position. Continuous oxygen was  provided with a nasal cannula and IV medicine administered  through a indwelling cannula. After adequate conscious  sedation was achieved, the patient was intubated and the  scope advanced under direct visualization to extent of  exam.  The scope was subsequently removed slowly while  carefully examining the color, texture, anatomy, and integrity  of the mucosa on the way out. The patient was  subsequently transferred to the recovery area in satisfactory  condition.      COMPLICATIONS: None  ESTIMATED BLOOD LOSS: None  BIOPSY TAKEN: No  BOWEL PREP QUALITY:  EXTENT OF EXAM: jejunum    Findings: The visualized jejunum was normal in appearance.  The gastriojejunal anastamosis was normal in appearance  without ulceration or acute angulation. The blind and  efferent jejunal limbs were normal. The views of the gastric  pouch were limited secondary to patient intolerance and  grabbing the scope. The gastric remnant was without the  presence of food. The esophagus was normal  in  appearance.    Endoscopic Diagnosis: 1. Anatomy consistent with roux-en-  y gastric bypass without significant angulation or ulceration  of gastro-jejunal anastamosis.  2. No endoscopic findings to explain abdominal pain.    Recommendations: 1. Patient should follow up with her  PMD for further evalaution of abdominal pain.  2. Outpatient GI referral if deemed necessary by primary  care physician.            Electronically signed by:  Dolores Frame. Constance Holster, M.D. 06/27/2012 11:51 A          DD: 06/15/2012    DT:  06/15/2012 05:43 P  DocNo.:  [id number]  SAF/bsm    Referring Physician:  SELF REFERRED        Primary Care Physician:  [facility] [place] [facility]  [address] [place], [place] [address]    cc:

## 2012-06-17 NOTE — ED Notes (Signed)
Bed:T7<BR> Expected date:<BR> Expected time:<BR> Means of arrival:<BR> Comments:<BR>

## 2012-06-17 NOTE — ED Notes (Signed)
63F level 2 for SI    Clover Mealy, MD  06/17/12 2003

## 2012-06-17 NOTE — ED Notes (Signed)
Attempted report to [place] spoke with [facility] who requested I call back in 30 minutes due to another admission. Charge RN Olegario Messier aware

## 2012-06-17 NOTE — ED Notes (Signed)
Pt went to [facility] from [facility]. Pt states she has been depressed x 21 weeks, hearing voices telling her to jump into traffic. Last si attempt 2008 with pills. Pt calm and cooperative at this time. Aaox4. Maex4. Vss. Side rails up x 2. sitter

## 2012-06-17 NOTE — ED Notes (Signed)
Pt stating that she is feeling "aggitated" and "anxious" pt is rocking back and forth in stretcher. Dr.Holman made aware

## 2012-06-17 NOTE — ED Notes (Signed)
Pt requesting to speak with charge RN at this time, refusing to speak to this RN regarding her need. Sitter remains at bedside with pt.

## 2012-06-17 NOTE — Procedures (Signed)
Report Author:  Dolores Frame. Constance Holster, M.D.    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    INSTRUMENTS: 316    MEDICATIONS: Versed 5mg , Demerol 125mg   NEED FOR ANESTHESIA:Moderate sedation    The attending physician, Dr.Shaneequa Bahner [facility], was present for  the entire examination.    PROCEDURE TECHNIQUE: A physical exam was  performed. Informed consent was obtained from the patient  after explaining all the risks (perforation, bleeding, infection,  missed lesion(s), and adverse effects to the medicine),  benefits and alternatives to the procedure which the patient  appeared to understand and so stated.  The patient was  connected to the monitoring devices and placed in the left  lateral position. Continuous oxygen was provided with a  nasal cannula and IV medicine administered through an  indwelling cannula. After adequate moderate sedation was  achieved, a digital exam was performed and the  colonoscope was introduced into the rectum and advanced  under direct visualization to the extent of exam. The scope  was subsequently removed slowly while carefully examining  the color, texture, anatomy, and integrity of the mucosa on  the way out. In the rectum, the scope was retroflexed to  evaluate for internal hemorrhoids and anorectal pathology.  The patient was subsequently transferred to the recovery  area in satisfactory condition.      COMPLICATIONS: None  ESTIMATED BLOOD LOSS: None  BIOPSY TAKEN: Yes  BOWEL PREP QUALITY:  EXTENT OF EXAM: Cecum    Findings: see below    Endoscopic Diagnosis: - Inadequate prep in many parts of  colon  - A flat 8mm polyp immediately proximal to proximal lip of IC  valve was observed and cold snare resected. Another 3mm  polyp was seen that was  removed with forceps  - A few hyperplastic appearing polyps observed in recto  sigmoid colon  - Given prep is inadequate even large and flat lesions can  be missed  - Slight nodularity at dentate line on retroflexion, s/p biopsies      Recommendations: Follow up biopsies  Patient should get repeat colonoscopy with 5 days of no  fiber, 3 days of liquid diet and double preparation WITHIN 6  MONTHS GIVEN ADENOMATOUS APPEARING POLYPS  HAVE BEEN REMOVED FROM INADEQUATELY  PREPPRED COLON            Electronically signed by:  Dolores Frame. Constance Holster, M.D. 06/27/2012 11:51 A          DD: 06/15/2012    DT:  06/15/2012 05:43 P  DocNo.:  [id number]  SAF/bsm    Referring Physician:  SELF REFERRED        Primary Care Physician:  [facility] [place] [facility]  [address] [place], [place] [address]    cc:

## 2012-06-18 ENCOUNTER — Inpatient Hospital Stay
Admission: EM | Admit: 2012-06-18 | Discharge: 2012-06-18 | Disposition: A | Discharge status: Home Health | Attending: Psychiatry | Admitting: Psychiatry

## 2012-06-18 ENCOUNTER — Inpatient Hospital Stay (HOSPITAL_COMMUNITY): Payer: Self-pay | Admitting: Psychiatry

## 2012-06-18 HISTORY — DX: Bipolar disorder, unspecified (CMS-HCC): F31.9

## 2012-06-18 HISTORY — DX: Schizophrenia, unspecified (CMS-HCC): F20.9

## 2012-06-18 MED ORDER — CLONIDINE HCL 0.1 MG OR TABS
0.1000 mg | ORAL_TABLET | Freq: Two times a day (BID) | ORAL | Status: DC | PRN
Start: 2012-06-18 — End: 2012-06-18

## 2012-06-18 MED ORDER — DOCUSATE SODIUM 250 MG OR CAPS
250.0000 mg | ORAL_CAPSULE | Freq: Every evening | ORAL | Status: DC
Start: 2012-06-18 — End: 2012-06-18

## 2012-06-18 MED ORDER — IBUPROFEN 400 MG OR TABS
400.0000 mg | ORAL_TABLET | ORAL | Status: DC | PRN
Start: 2012-06-18 — End: 2012-06-18

## 2012-06-18 MED ORDER — DIPHENHYDRAMINE HCL 25 MG OR TABS OR CAPS CUSTOM
50.0000 mg | ORAL_CAPSULE | Freq: Three times a day (TID) | ORAL | Status: DC | PRN
Start: 2012-06-18 — End: 2012-06-18

## 2012-06-18 MED ORDER — CITALOPRAM HYDROBROMIDE 20 MG OR TABS
20.0000 mg | ORAL_TABLET | Freq: Every day | ORAL | Status: DC
Start: 2012-06-18 — End: 2012-06-18
  Administered 2012-06-18: 20 mg via ORAL
  Filled 2012-06-18: qty 1

## 2012-06-18 MED ORDER — CITALOPRAM HYDROBROMIDE 20 MG OR TABS
20.0000 mg | ORAL_TABLET | Freq: Every day | ORAL | Status: DC
Start: 2012-06-18 — End: 2012-06-18

## 2012-06-18 MED ORDER — LOPERAMIDE HCL 2 MG OR CAPS
4.0000 mg | ORAL_CAPSULE | Freq: Four times a day (QID) | ORAL | Status: DC | PRN
Start: 2012-06-18 — End: 2012-06-18

## 2012-06-18 MED ORDER — ACETAMINOPHEN 325 MG PO TABS
650.0000 mg | ORAL_TABLET | ORAL | Status: DC | PRN
Start: 2012-06-18 — End: 2012-06-18
  Administered 2012-06-18: 650 mg via ORAL
  Filled 2012-06-18: qty 2

## 2012-06-18 MED ORDER — CITALOPRAM HYDROBROMIDE 20 MG OR TABS
20.0000 mg | ORAL_TABLET | Freq: Every day | ORAL | Status: DC
Start: 2012-06-18 — End: 2013-08-22

## 2012-06-18 NOTE — ED Notes (Signed)
Pts 3 belonging bags returned to pt at this time, security contacted to transport pt to west wing

## 2012-06-18 NOTE — Progress Notes (Signed)
Psychiatry Resident Discharge Note    ID: Alexandria Proctor is a 56 year old homeless and unemployed female with no self admitted psych history admitted for evaluation of SI contingent on hospitalization in the context of homelessness and opiate/pain med dependence with UTOX + oxycodone. Patient was just discharged from medicine floor after a full negative work up for abdominal pain, where she was continuously asking for IV and PO opiate pain medications.     "CURES report obtained under name Alexandria Proctor. Starting in June 2013, patient has obtained Percocet prescription 7 times from 7 different doctors using different addresses including [place], [place], [place], [place], [place], and [place]." They also note that " patient has multiple MRNs [phone] and [phone]) due to reporting false SSNs and addresses. Under these MRNs, each hospital course is the same: 10/10 GI abdominal pain, vitals/labs/imaging normal, no etiology is found, and the patient requests large amount of narcotics."     Mental Status Exam:  Appearance: disheveled, wearing hospital gown and socks   Behavior: aggitated but cooperative with interview, good eye contact   Cognition: AOx3   Speech: normal rate, rhythm and tone.   Mood: "angry and depressed"  Affect: full   Thought Process: linear, goal oriented   Thought Content: She denies AVH and HI. Patient's suicidal thoughts are contingent on hospitalization. There is no evidence of active suicidal ideation, plan, or intent.  Insight/Judgment: Fair. Does not understand why we are not providing her pain medications    Discharge Diagnoses:  Axis I: Depression NOS, r/o substance induced, r/o malingering  Opiate intoxication, opiate dependence, ongoing  Axis II: deferred   Axis III: Chronic pain  Axis IV: homelessness, social support, financial problems  Axis V: GAF on Admission: 35 At discharge: 45    [place] Functional Assessment Scale:  Day of Discharge Total Score: 21     Plan is to continue  the following psychiatric medications after discharge:   Celexa 20 mg daily for depression    This patient was discussed with Dr. Bland Span, who agrees with patient discharge today.    -Case discussed with Dr. Bland Span who agrees with the above plan.   Note written in conjunction with Sung Amabile, MS3.     Sherril Croon   Resident Physician

## 2012-06-18 NOTE — Plan of Care (Signed)
Problem: Altered mood as evidenced by mania and/or depression  Goal: Reduction and/or stabilization of target symptoms  Outcome: Resolved Date Met:  06/18/12  Pt irritable and easily agitated.  After breakfast, pt asked for a menu to fill out.  When it was explained to her that it is not available since she got here during night shift, she raised her voice and said "I want to know what food I'm going to get - I'm not a dog!"      Pt c/o feeling depressed, and affect congruent with mood.  Pt primarily stayed in her bed.  She is isolative, refused to talk to this Clinical research associate saying "you're asking me the same questions the doctor asked - please stop!"  No interaction with peers observed, and pt refused to attend groups.  Good attention to ADLs and no delusions expressed.     Pt is compliant with her medication.  No adverse reaction observed at this time.          Problem: Altered safety related to suicide intent/plan and increased risk for suicide  Goal: Patient is free from self harm, denies active suicidal intent or plan  Outcome: Resolved Date Met:  06/18/12  Pt endorses conditional SI with no plan due to housing problems. Pt also endorsed AH telling her to kill self. Pt denies HI/VH.

## 2012-06-18 NOTE — Consults (Signed)
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  S/p ventral hernia repair.  C/O RLE repair.    See resident history and physical for further details of the patient's history.    Objective    I have examined the patient and concur with the resident exam.    Assessment and Plan    I agree with the resident care plan.    See the resident history and physical for further details.    I spent 30 minutes of time on the patient care unit reviewing the patient's chart and examining the patient.

## 2012-06-18 NOTE — Plan of Care (Signed)
Psychiatry Multidisciplinary Treatment Plan    Admission Date: 06/18/2012  Legal Status: Voluntary    ADMISSION  Psychiatric Diagnosis (DSM IV)  AXIS I:Depression NOS, R/o Substance induced, R/O Malingering, Opiate Intoxication, Opiate Dependence    AXIS YN:WGNFAOZH    AXIS YQM:VHQIONG Pain    Axis IV: Housing problems and Economic problems    AXIS V: Current global assessment of functioning:35     Highest in past year: unknnown     PATIENT STRENGTHS:  Patient Strengths: Seeking treatment voluntarily;Accepts need for medication/therapeutic interventions    IDENTIFIED PROBLEMS: There are no hospital problems to display for this patient.      ANTICIPATED DISCHARGE PLAN: Other:Shelter referrals    Discharge Criteria:No acute suicidal intent x 24 hrs.    Plan of Care:  Celexa for depression.  Individual therapy.  Supportive groups  Sobriety resources    ELOS: 24-48 hrs.                  _________________________         ____/____/___      _________  Patient Signature                                      Date                    Time        _________________________  Printed Name      Patient refused to sign:   Yes       No        _________________________         ____/____/___      _________  Witness          Date                    Time

## 2012-06-18 NOTE — Discharge Instructions (Signed)
Diagnosis and Reason for Admission    You were admitted to the hospital for the following reason(s):  You were discharged from medicine yesterday, 06/17/12, after a full work up for abdominal pain, which was negative and within normal limits. You were discharged from medicine with a prescription or 10 tablets of Percocet. You endorsed suicidal ideations with plan to jump into traffic, contingent on hospitalization.     Your full diagnosis list is located on this After Visit Summary in the Hospital Problems section.    What Happened During Your Hospital Stay    The main tests and treatments done for you during this hospitalization were:  Anitdepressant medication treatment and urine toxicology, which was positive for oxycodone.    The following evaluation is still important to complete after discharge from the hospital:  Follow up with an out patient psychiatrist.     Instructions for After Discharge    Your diet at home should be a regular diet.    Your activity level at home should be:  regular activity.    Specific activity restrictions:    None    Other instructions:  None    Your medication list is located on this After Visit Summary in the Current Discharge Medication List section.  Your nurse will review this information with you before you leave the hospital.    It is very important for you to keep a current medication list with you in order to assist your doctors with your medical care.  Bring this After Visit Summary with you to your follow up appointments.    Reasons to Contact a Doctor Urgently    Call 911 or go to the nearest hospital Emergency Department immediately if:  You become a danger to yourself or others.     You should contact your outpatient therapist or psychiatrist, the Emergency Screening Unit [phone]) or the Access and Crisis Line [phone]) for any of the following reasons: You become a danger to yourself or others. You have any medication side effects.     If you have any  questions about your hospital care, your medications, or if you have new or concerning symptoms soon after going home from the hospital, and you need to contact your hospital psychiatrist, your hospital psychiatrist can be contacted at the Neurobehavioral Medicine Unit [facility] / Ezz[facility] Standing) at [phone].    Once you are able to see outpatient psychiatrist, he or she will then be responsible for further medication refills, or appointment referrals.    What Needs to Happen Next After Discharge -- Appointments and Follow Up    Any appointments already scheduled at [facility] will be listed in the Scheduled Appointments section at the top of this After Visit Summary.  Any appointments that have been requested, but have not yet been scheduled, will be listed below that under Post Discharge Referral.    Additional Information for You from your Case Manager and/or Social Worker (if applicable)  Next level of care referral: Patient (or guardian) refused next level of care or refused to authorize release of information     You will be discharging today to your own recognizance, and were provided a list of emergency resources for food, clothing, shelter, and mental health outpatient treatment providers by your social worker.     For outpatient psychiatric follow up, please visit [facility], located at [address] [place], [place] [address], Tel: [phone], FAX: [phone]. Your initial visit will  be on a walk-in basis. Walk-in hours are from 10:00 am to 4 pm Monday through Friday. No collateral information was sent today because you refused to allow release of consent but please contact [facility], [phone], to have your medical records faxed to this new provider when established.    To learn more information about mental health diagnoses and for family and peer support, please contact [facility] at [phone] or [phone]. You may also visit  [email or web address].     Please utilize the [facility] [employer], [phone], 24 hours/7 days per week for psychiatric emergencies and referrals.    Medical Home Information    Your primary care provider or clinic currently on file at  is: Clinic, [facility]    If we are assigning you a new medical home for your follow up after discharge, that information will appear here:       Specific timing of follow-up:      Additional Discharge Information (if applicable)            Handouts Given to You (if applicable)

## 2012-06-18 NOTE — H&P (Signed)
PSYCHIATRIC ATTENDING INTAKE NOTE    I have reviewed this patient's history, presentation, and diagnosis with the multidisciplinary treatment team. This patient was interviewed and examined by me. I concur with the information recorded in the house staff H&P.    The patient is a 56 y/o female with h/o chronic pain and opiate abuse self presented after d/c from medicine complaining of suicidal thoughts to run into traffic in the context of homelessness.  Patient admitted for observation and safety.  Patient known to me from medical floor where we were consulted for suicidal thoughts contingent on getting opiod pain medications.  After review of patient's video, meeting with team and meeting twice with patient it was determined that patient does not meet criteria for further inpatient treatment. She appears to be malingering psychiatric symptoms to help with housing and pain management. She did not receive opiates here and it not experiencing symptoms of withdrawal.  She was consented to start Celexa to help with depressed mood associated with chronic pain.  She was agreeable to discharge with shelter referrals.     Mental Status Exam:   Appearance: casually groomed   Behavior: initially irritable and demanding, but later calm and cooperative   Cognition: alert   Speech: wnl   Mood: "not great"   Affect: constricted   Thought Process: linear, goal oriented   Thought Content: No evidence thoughts of harming self or others, hallucinations, or delusions  Insight/Judgment: limited    My current diagnoses of this patient are:     Axis I: Depression NOS             Opiod dependence             R/o malingering   Axis II: - Deferred.  Axis III: - Chronic Pain  Axis IV: - Financial, Housing, Primary Support  Axis V: - GAF 35    In consultation with the multidisciplinary treatment team, my care plan is:   Psychiatric:   Start Celexa 20 mg targeting mood - pt will be given script for d/c  Comprehensive suicide risk assessment  was completed and patient was assessed to be low acute risk for self harm.  She is future oriented about getting her check.  She has access to health care.  She has access to shelters.  Medical:   None acute  Legal Status:   Voluntary   Disposition Planning:   D/c today with shelter and SRO lists  F/u with PCP     Please see the resident's H&P for further details.
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
## 2012-06-18 NOTE — Procedures (Signed)
FINAL PATHOLOGIC DIAGNOSIS:  A: Colon polyp, cecum, biopsy       - Tubular adenoma in both fragments.  B: Dentate line, biopsy       - Squamous epithelium with no diagnostic alteration.    SPECIMEN(S) SUBMITTED:  A: Cecal polyp  B: Dentate line biopsy  CLINICAL HISTORY: 56 year old woman with abdominal pain. Screening  colonoscopy. Slight nodularity at dentate line.    GROSS DESCRIPTION:  A: The specimen (received in formalin, labeled with the patient's name,  medical record number and "cecal polyp") consists of three red-tan soft  tissue fragments ranging in size from 0.5 up to 0.8 cm in greatest  dimension. The entire specimen is submitted in cassette A1.  B: The specimen (received in formalin, labeled with the patient's name,  medical record number and "dentate line biopsy") consists of two light tan  soft tissue fragments ranging in size from 0.2 to 0.4 cm. The entire  specimen is submitted in cassette B1.  MW/PT/nw  CONFIDENTIAL HEALTH INFORMATION: Health Care information is personal and  sensitive information. If it is being faxed to you it is done so under  appropriate authorization from the patient or under circumstances that do  not require patient authorization. You, the recipient, are obligated to  maintain it in a safe, secure and confidential manner. Re-disclosure  without additional patient consent or as permitted by law is prohibited.  Unauthorized re-disclosure or failure to maintain confidentiality could  subject you to penalties described in federal and state law.  If you have  received this report or facsimile in error, please notify the [facility] immediately and destroy the received document(s).    Material reviewed and Interpreted and  Report Electronically Signed by:  Renae Fickle M.D., Ph.D. [phone])  Attending Surgical Pathologist  06/18/12 20:50  Electronic Signature derived from a single  controlled access password

## 2012-06-18 NOTE — Interdisciplinary (Signed)
Pt evaluated and cleared for discharge to self.  Pt left AOX4 and ambulated in no apparent distress.  Discharge instructions reviewed with the pt, and she verbalized understanding. Pt signed all her discharge paper work, and copies given to her. Belongings and valuables returned to her. Pt given bus tokens for transport.

## 2012-06-18 NOTE — Interdisciplinary (Addendum)
[employer] [facility] Social Work Admission/Discharge Note:  Pt is a 56 year old female admitted on a voluntary legal status after self-presenting endorsing SI in the context of pain management concerns and housing. Pt was discharged from medicine service a day prior, where she had been admitted for workup of abdominal pain, w/o abnormal findings. Per H/P pt has hx of being manipulative, malingering for housing, and going to different providers to obtain pain medications. Pt was at one point living with daughter but per H/P not currently. Pt homeless, receiving SSI but no funds until 06/24/12. No psychiatric providers.   Case reviewed with MD and treatment team. Plan to dc to own recognizance today.   LCSW and MD met with pt for dc. Pt was calm and cooperative during interview, agrees with dc today, and welcomed information on community resources for emergency food, clothing, and shelter. Pt was also provided with information on outpatient psychiatric f/u. Pt declined to sign ROI for care coordination, and also declined CM referral. Pt was encouraged to utilize resources appropriately. Pt denied SI, HI, AH, and VH. Pt had no further questions or concerns.

## 2012-06-18 NOTE — Plan of Care (Signed)
Problem: Altered mood as evidenced by mania and/or depression  Goal: Reduction and/or stabilization of target symptoms  Outcome: Met  Slept 4.75 hours, solid sleep since admit process.  C/O 8/10 Head ache this AM.  PRN Tylenol provided.  Admits to sleeping well.  No further complaints offered, pt affect appropriate.      Problem: Altered safety related to suicide intent/plan and increased risk for suicide  Goal: Patient is free from self harm, denies active suicidal intent or plan  Outcome: Met  Verbally denies intent to harm self.  Admits that " I probably never would have run into the street ".  Goal: Patient care delivery when patient is on a secure locked unit  Outcome: Met  NBMU

## 2012-06-18 NOTE — ED Notes (Signed)
[place] and white band removed at this time per md order

## 2012-06-18 NOTE — ED Notes (Signed)
Report called to [facility] on [facility], he is aware of pt and her condition. Pt will be going to the floor with tech and security

## 2012-11-14 NOTE — Progress Notes (Signed)
Quick Note:    Hi Alexandria Proctor/Alexandria Proctor,  Please inform patient/referring/PCP that no concerning issues on biopsies OTHER THAN ADENOMA. Same recommendations as per endoscopy report. Any questions, confusion or issues, please let me know.  Thanks,  Abbas    ______

## 2013-03-24 ENCOUNTER — Emergency Department (HOSPITAL_COMMUNITY): Payer: Self-pay

## 2013-03-24 ENCOUNTER — Emergency Department
Admission: EM | Admit: 2013-03-24 | Discharge: 2013-03-25 | Disposition: A | Discharge status: Home / Self Care | Payer: 59 | Source: Other Acute Inpatient Hospital | Attending: Emergency Medicine | Admitting: Emergency Medicine

## 2013-03-24 DIAGNOSIS — E876 Hypokalemia: Secondary | ICD-10-CM | POA: Insufficient documentation

## 2013-03-24 DIAGNOSIS — F1920 Other psychoactive substance dependence, uncomplicated: Secondary | ICD-10-CM | POA: Insufficient documentation

## 2013-03-24 DIAGNOSIS — R197 Diarrhea, unspecified: Secondary | ICD-10-CM | POA: Insufficient documentation

## 2013-03-24 DIAGNOSIS — R109 Unspecified abdominal pain: Secondary | ICD-10-CM | POA: Insufficient documentation

## 2013-03-24 LAB — URINALYSIS
Nitrite: NEGATIVE
pH: 6.5 (ref 5.0–8.0)

## 2013-03-24 MED ORDER — HYDROMORPHONE HCL 1 MG/ML IJ SOLN
1.0000 mg | Freq: Once | INTRAMUSCULAR | Status: AC
Start: 2013-03-24 — End: 2013-03-24
  Administered 2013-03-24: 1 mg via INTRAVENOUS
  Filled 2013-03-24: qty 1

## 2013-03-24 MED ORDER — SODIUM CHLORIDE 0.9 % IV SOLN
12.5000 mg | Freq: Once | INTRAVENOUS | Status: AC
Start: 2013-03-24 — End: 2013-03-25
  Administered 2013-03-24: 12.5 mg via INTRAVENOUS
  Filled 2013-03-24: qty 0.5

## 2013-03-24 MED ORDER — SODIUM CHLORIDE 0.9 % IV BOLUS
1000.0000 mL | INJECTION | Freq: Once | INTRAVENOUS | Status: AC
Start: 2013-03-24 — End: 2013-03-25
  Administered 2013-03-24: 1000 mL via INTRAVENOUS

## 2013-03-24 NOTE — ED Provider Notes (Signed)
History  Chief Complaint   Patient presents with   . Abdominal Pain     medial abdominal pain, "right around my navel area" x 4 days. +n/d. denies f/c. watery bm this am     HPI  57 year old F with h/o schizoaffective disorder, well documented opioid dependence and malingering in past, now p/w with complaint of 4 days of umbilical abd pain.  She states she's having 4-5 episodes diarrhea daily, non-bloody.  Denies n/v, fever, back pain.  Has had gastric bypass surgery, cholecystectomy, ventral hernia repair, full hysterctomy.  Tolerating PO.      Past Medical History   Diagnosis Date   . Manic depression    . Bipolar 1 disorder    . Schizophrenia      PSxH: gastric bypass, hysterectomy, cholecystectomy, ventral hernia repair    FH: no diabetes    SocH: +smoking, denies etoh, drugs    Review of Systems   Constitutional: Negative for fever and chills.   HENT: Negative for congestion, facial swelling, rhinorrhea and neck pain.    Eyes: Negative for pain and visual disturbance.   Respiratory: Negative for cough and shortness of breath.    Cardiovascular: Negative for chest pain.   Gastrointestinal: Positive for abdominal pain and diarrhea. Negative for nausea and vomiting.   Endocrine: Negative for polydipsia and polyuria.   Genitourinary: Negative for dysuria, hematuria, flank pain, vaginal bleeding, vaginal discharge and difficulty urinating.   Musculoskeletal: Negative for back pain and gait problem.   Skin: Negative for rash and wound.   Allergic/Immunologic: Negative for immunocompromised state.   Neurological: Negative for weakness and headaches.   Hematological: Does not bruise/bleed easily.   Psychiatric/Behavioral: Negative for suicidal ideas.       Physical Exam  BP 125/79  Pulse 86  Temp(Src) 98.5 F (36.9 C)  Resp 18  Wt 87.998 kg (194 lb)  BMI 31.33 kg/m2  SpO2 98%    Physical Exam   Nursing note and vitals reviewed.  Constitutional: She is oriented to person, place, and time. She appears  well-developed. No distress.   HENT:   Head: Normocephalic and atraumatic.   Mouth/Throat: Oropharynx is clear and moist.   Eyes: Conjunctivae and EOM are normal. Pupils are equal, round, and reactive to light.   Neck: Normal range of motion.   Cardiovascular: Normal rate, regular rhythm, normal heart sounds and intact distal pulses.    Pulmonary/Chest: Effort normal and breath sounds normal.   Abdominal: Soft. Bowel sounds are normal. There is tenderness.   Multiple abd scars, tender around umbilicus, no guarding, obesity, hard to determine if mass present or just obese with overlying scar tissue   Genitourinary:   Rectal: normal tone, no masses/fissures, guaiac negative    Musculoskeletal: Normal range of motion. She exhibits no tenderness.   Neurological: She is alert and oriented to person, place, and time.   Skin: Skin is warm and dry.       ED Course/Medical Decision Making Narrative  57 year old F with umbilical pain for 4 days with diarrhea.  Pt afebrile with normal vitals; pulse ox is 97% which is normal.  C/f appendicitis although 4 days is long for that.  Consider enteritis, partial obstruction, volvulus/intussusception.  Consider malingering given h/o opioid dependece  Consider opioid withdrawal.  Check labs, give fluids, pain meds, CT abdomen given multiple surgeries, reassess.    No leukocytosis.  Trace hypoK and transaminemia.  UA neg.   IMPRESSION:  CT scan with  IV contrast    1. Diffuse fatty infiltration and thickening of the gastric wall likely   secondary to gastritis. No other abnormalities on this CT to explain patient's  symptoms.    2. Grade 1 anterolisthesis of L4 on L5.        Labs and CT unremarkable.  Consider enteritis vs opioid dependence vs malingering.  Pt became very agitated upon counseling at time of discharge, did not want to leave until "got another pain shot".  Explained no indication for IV meds at this time, will dc with oral opioids, short course, and will need to f/u at PMD  for further w/u or analgesics.  Return precautions provided.         Case discussed with Dr. Orvan Falconer.          Critical Care Time            Additional Notes      Home Medication List  Prior to Admission Medications   Outpatient Medications Last Dose Informant Patient Reported? Taking?   citalopram (CELEXA) 20 MG tablet   No No   Sig: Take 1 tablet by mouth daily.      Facility-Administered Medications: None       Barnabas Lister, MD  Resident  03/30/13 1021

## 2013-03-25 DIAGNOSIS — R109 Unspecified abdominal pain: Secondary | ICD-10-CM

## 2013-03-25 MED ORDER — DIPHENHYDRAMINE HCL 50 MG/ML IJ SOLN
INTRAMUSCULAR | Status: AC
Start: 2013-03-25 — End: 2013-03-25
  Administered 2013-03-25: 25 mg via INTRAVENOUS
  Filled 2013-03-25: qty 50

## 2013-03-25 MED ORDER — DIPHENHYDRAMINE HCL 50 MG/ML IJ SOLN
25.0000 mg | Freq: Once | INTRAMUSCULAR | Status: DC
Start: 2013-03-25 — End: 2013-03-25

## 2013-03-25 MED ORDER — LORAZEPAM 2 MG/ML IJ SOLN
1.0000 mg | Freq: Once | INTRAMUSCULAR | Status: AC
Start: 2013-03-25 — End: 2013-03-25
  Administered 2013-03-25: 1 mg via INTRAVENOUS
  Filled 2013-03-25: qty 1

## 2013-03-25 MED ORDER — OXYCODONE-ACETAMINOPHEN 5-325 MG OR TABS
1.0000 | ORAL_TABLET | Freq: Four times a day (QID) | ORAL | Status: DC | PRN
Start: 2013-03-25 — End: 2013-06-29

## 2013-03-25 MED ORDER — HYDROMORPHONE HCL 1 MG/ML IJ SOLN
1.0000 mg | Freq: Once | INTRAMUSCULAR | Status: AC
Start: 2013-03-25 — End: 2013-03-25
  Administered 2013-03-25: 1 mg via INTRAVENOUS
  Filled 2013-03-25: qty 1

## 2013-03-25 NOTE — ED Notes (Signed)
PO Trial: pt drank water, stated that she felt nauseated however she did not vomit.

## 2013-03-25 NOTE — ED Notes (Signed)
Assumed Care at 3:21 AM from Marissa Calamity*  CC:   Chief Complaint   Patient presents with   . Abdominal Pain     medial abdominal pain, "right around my navel area" x 4 days. +n/d. denies f/c. watery bm this am     Case Summary:  abd pain with incarcerated hernia. Sharp pain around umbilicus.  abd ct pending    Thornell Sartorius, MD  03/25/13 [phone]

## 2013-03-25 NOTE — Discharge Instructions (Signed)
Abdominal Pain    You have been diagnosed with abdominal (belly) pain. The cause of your pain is not yet known.    Many things can cause abdominal pain. Examples include viral infections and bowel (intestine) spasms. You might need another examination or more tests to find out why you have pain.    At this time, your pain does not seem to be caused by anything dangerous. You do not need surgery. You do not need to stay in the hospital.     Though we don't believe your condition is dangerous right now, it is important to be careful. Sometimes a problem that seems mild can become serious later. This is why it is very important that you return here or go to the nearest Emergency Department unless you are 100% improved.    YOU SHOULD SEEK MEDICAL ATTENTION IMMEDIATELY, EITHER HERE OR AT THE NEAREST EMERGENCY DEPARTMENT, IF ANY OF THE FOLLOWING OCCURS:   Your pain does not go away or gets worse.   You cannot keep fluids down or your vomit is dark green.    You vomit blood or see blood in your stool. Blood might be bright red or dark red. It can also be black and look like tar.   You have a fever or shaking chills.   Your skin or eyes look yellow or your urine looks brown.   You have severe diarrhea.

## 2013-03-25 NOTE — ED Notes (Signed)
To CT via gurney accompanied by ED Tech.

## 2013-03-25 NOTE — ED Notes (Signed)
Pt ate a [place] sandwich and drank water with no problems.

## 2013-03-25 NOTE — ED Notes (Signed)
Discharged home with instructions and a prescription for Percocet. Pt verbalized understanding and ambulated with a steady gait from the tx area.

## 2013-03-25 NOTE — ED Notes (Signed)
Pt taken to CT via gurney.

## 2013-03-25 NOTE — ED Attending Note (Signed)
ED ATTENDING NOTE:    Pt is a 57 year oldfemale with a chief complaint of 4 days of infraumbilical sharp nonradiating pain with 4-5 episodes of diarrhea per day. Non-bloody no vomiting no fevers. Hx of choly, gastric bypass and ventral hernia repair. 2006 gastric bypass. Lost 164 lb. No urinary sx. No chest pain.     Case seen and discussed with res. Hp reviewed.i agree with assessment and treatment plan    pmh triage confirmed   Past Medical History   Diagnosis Date   . Manic depression    . Bipolar 1 disorder    . Schizophrenia      Sh no drugs etoh +++cigg  Ros neg otherwise all reviewed  fmh noncontributory otherwise    pe stable 57 year old female BP 114/90  Pulse 74  Temp(Src) 98.5 F (36.9 C)  Resp 18  Wt 87.998 kg (194 lb)  BMI 31.33 kg/m2  SpO2 100% noted wnl sao2 wnl  Lungs ctab no wheezing no rhonchi or rales  Heart s1,s2 rrr no murmur  abd soft bs pos,tender in the periumbilical region with mass about 10 cm very tender, ,nm,nr,ng,nd  Throat wnl  Eyes anict 4-2 perrla eom intact  Ears tms wnl  Skin no rashes  Neck supple no nodes no midline pain  Back nontender spine no flank pain  Ext wnl no edema  Neuro face symmetric,   b upper and lower ext intact, symmetric  G+ control pos per dr Lorn Junes.  Assessment  Dd includes incarcerated hernia, psbo.  Possible gi bleed or gastritis possible early appy  Noted low k, wbc ok, mild anemia ua neg will need ct  May have bowel necrosis

## 2013-03-25 NOTE — ED Notes (Signed)
PRN note  Pt itchy legs, skin very dry. Pt was wiping her legs down with alcohol swabs and hospital cleaner wipes. Pt stopped and educated on importance of not using either item of skin as it is very drying and could cause more damage. Pt verbalized understanding. Pt provided wet washcloth to cleans her feet.  MD Lorn Junes informed of itchy skin. No rash noted. Pt denies SOB, difficulty swallowing. No wheezing noted.

## 2013-03-30 NOTE — ED Follow-up Note (Signed)
Follow-up type: Callback       Routine ED Patient Call Back    Patient unable to be contacted, no message left

## 2013-06-23 ENCOUNTER — Emergency Department
Admission: EM | Admit: 2013-06-23 | Discharge: 2013-06-24 | Disposition: A | Discharge status: Other Acute Inpatient Hospital | Payer: 59 | Attending: Emergency Medicine | Admitting: Emergency Medicine

## 2013-06-23 DIAGNOSIS — G039 Meningitis, unspecified: Secondary | ICD-10-CM

## 2013-06-23 DIAGNOSIS — M542 Cervicalgia: Secondary | ICD-10-CM | POA: Insufficient documentation

## 2013-06-23 DIAGNOSIS — Z0389 Encounter for observation for other suspected diseases and conditions ruled out: Secondary | ICD-10-CM

## 2013-06-23 MED ORDER — HYDROMORPHONE HCL 1 MG/ML IJ SOLN
1.0000 mg | Freq: Once | INTRAMUSCULAR | Status: AC
Start: 2013-06-23 — End: 2013-06-23
  Administered 2013-06-23: 1 mg via INTRAVENOUS
  Filled 2013-06-23: qty 1

## 2013-06-23 MED ORDER — PROCHLORPERAZINE EDISYLATE 5 MG/ML IJ SOLN
10.0000 mg | Freq: Once | INTRAMUSCULAR | Status: DC
Start: 2013-06-23 — End: 2013-06-23
  Filled 2013-06-23: qty 2

## 2013-06-23 MED ORDER — HYDROMORPHONE HCL 1 MG/ML IJ SOLN
1.0000 mg | Freq: Once | INTRAMUSCULAR | Status: DC
Start: 2013-06-23 — End: 2013-06-23

## 2013-06-23 MED ORDER — SODIUM CHLORIDE 0.9 % IV BOLUS
1000.0000 mL | INJECTION | Freq: Once | INTRAVENOUS | Status: AC
Start: 2013-06-23 — End: 2013-06-23
  Administered 2013-06-23: 1000 mL via INTRAVENOUS

## 2013-06-23 MED ORDER — HYDROMORPHONE HCL 1 MG/ML IJ SOLN
1.0000 mg | Freq: Once | INTRAMUSCULAR | Status: AC
Start: 2013-06-24 — End: 2013-06-24
  Administered 2013-06-24: 1 mg via INTRAVENOUS
  Filled 2013-06-23: qty 1

## 2013-06-23 MED ORDER — SODIUM CHLORIDE 0.9 % IV SOLN
25.0000 mg | Freq: Once | INTRAVENOUS | Status: AC
Start: 2013-06-23 — End: 2013-06-24
  Administered 2013-06-23: 25 mg via INTRAVENOUS
  Filled 2013-06-23: qty 1

## 2013-06-23 MED ORDER — VANCOMYCIN HCL 1000 MG IV SOLR
15.0000 mg/kg | Freq: Once | INTRAVENOUS | Status: DC
Start: 2013-06-24 — End: 2013-06-24

## 2013-06-23 MED ORDER — SODIUM CHLORIDE 0.9 % IV SOLN
2000.0000 mg | Freq: Once | INTRAVENOUS | Status: AC
Start: 2013-06-24 — End: 2013-06-24
  Administered 2013-06-24: 2000 mg via INTRAVENOUS
  Filled 2013-06-23: qty 2000

## 2013-06-23 MED ORDER — LIDOCAINE-EPINEPHRINE 1 %-1:100000 IJ SOLN
20.0000 mL | Freq: Once | INTRAMUSCULAR | Status: AC
Start: 2013-06-23 — End: 2013-06-23
  Administered 2013-06-23: 20 mL via INTRADERMAL
  Filled 2013-06-23: qty 20

## 2013-06-23 NOTE — ED Notes (Signed)
 Resource RN note: received call from Trent Woods at [facility] requesting information on patient for possible transfer, may be reached at [phone]

## 2013-06-23 NOTE — ED Notes (Signed)
 Assumed care of pt who is resting on gurney with no acute distress noted, attempted PIV x 1 w/o success, pt states US  guided is usually needed, pt is a/o x 4, abc's intact, states she feels like "ending it all" because her head hurts, pt has no plan and no intention of following through on harming herself that she was just thinking about it, pt is calm and cooperative, sitter at bedside, will continue to monitor and reassess, second RN to use US  for PIV and labs in pt room

## 2013-06-23 NOTE — ED Notes (Signed)
 Pt little change purse- brown,  And phone charger put in psych locker  c other belongings

## 2013-06-23 NOTE — ED Notes (Signed)
Resource RN note: to ct via gurney per ct tech, side rails up x 2

## 2013-06-23 NOTE — ED Notes (Signed)
 Resource RN note: back from ct

## 2013-06-23 NOTE — ED Notes (Signed)
 Pt placed on airborne precautions per MD order. Sitter remains outside door.

## 2013-06-23 NOTE — ED Notes (Signed)
 Pt resting on gurney watching tv with no acute distress noted, pt breathing even and unlabored, abc's intact, vs stable, a/o x 4, sitter at bedside, calm and cooperative, will continue to monitor and reassess pt status

## 2013-06-23 NOTE — ED Provider Notes (Addendum)
 History of Present illness  Alexandria Proctor is a 57 year old female hx depression and bipolar presents for HA, 10/10, gradual onset, 3 days ago. Generalized. Stabbing in occiput. +neck stiff. +fever, chills. +myalgias. States whole back hurts. No sick contacts. No recent travel. Works with homeless. States wants to hurt self since HA is so bad. No HI/AH/VH. Been using norco and tylenol  w/o relief.    Past Medical History  Past Medical History   Diagnosis Date   . Manic depression    . Bipolar 1 disorder    . Schizophrenia        Past Surgical History  No past surgical history on file.    Medications  No current facility-administered medications on file prior to encounter.     Current Outpatient Prescriptions on File Prior to Encounter   Medication Sig Dispense Refill   . citalopram  (CELEXA ) 20 MG tablet Take 1 tablet by mouth daily.  30 tablet  0   . oxyCODONE -acetaminophen  (PERCOCET) 5-325 MG per tablet Take 1 tablet by mouth every 6 hours as needed for Severe Pain (Pain Score 7-10).  20 tablet  0       Family Hx  Family history reviewed    Social Hx  + Tobacco  - EtOH  - IVDU    Review of Systems  12 point ROS all reviewed and otherwise negative except per HPI.  Constitutional:+fever.   HENT: Negative for sore throat.   Eyes: Negative for visual disturbance.   Respiratory: Negative for cough and shortness of breath.   Cardiovascular: Negative for chest pain and leg swelling.   Gastrointestinal: Negative for abdominal pain and abdominal distention.   Endocrine: Negative for polyuria.   Genitourinary: Negative for dysuria.   Musculoskeletal: Negative for back pain.   Skin: Negative for rash.   Allergic/Immunologic: Negative for immunocompromised state.   Neurological: +headaches.   Hematological: Negative for adenopathy.   Psychiatric/Behavioral: Negative for confusion.      Physical Exam  4    06/23/13  1857 06/23/13  2037 06/23/13  2305 06/24/13  0031   BP:  120/74 114/71 107/63   Pulse:  90 78 72   Temp: 100.4  F (38 C) 99.2 F (37.3 C) 99 F (37.2 C)    Resp:  16 17 16    SpO2:  95% 95% 92%        EKG: none    GEN: WNWD, NAD, AOx3, Speaking in full sentences  SKIN: warm/dry, no rashes or bruising noted  HEENT: NCAT, EOMI, TM normal, MMM, op clear.  NECK: supple, no LAD  CHEST: RRR,distal pulses 3+  LUNGS: CTAB   ABD: Soft, NTND  MUSCULOSKELETAL: Moves all four distal extremities well with full range of motion.   NEURO: CN 2-12 intact, 5/5 motor strength x 4 ext, normal sensory exam, normal DTR's, stable gait, no ataxia, normal RAM    ED Labs  Labs Reviewed   CBC WITH ADIFF, BLOOD - Abnormal; Notable for the following:     WBC 15.9 (*)     Hgb 11.1 (*)     Hct 33.9 (*)     RDW 16.5 (*)     Segs 81 (*)     Lymphocytes 7 (*)     ANC-Automated 13.0 (*)     Abs Monos 1.7 (*)     All other components within normal limits   COMPREHENSIVE METABOLIC PANEL, BLOOD   LACTATE, BLOOD   CSF CELL COUNT / DIFF  CSF CELL COUNT / DIFF   GLUCOSE, CSF   TOTAL PROTEIN, CSF   CSF CULTURE W GRAM STAIN, AEROBIC ONLY   URINALYSIS   BLOOD CULTURE ROUTINE   BLOOD CULTURE ROUTINE   URINE CULTURE       Imaging  Orders Placed This Encounter   Procedures   . CT Head W/O Contrast   . X-Ray Chest Frontal And Lateral     Ct Head W/o Contrast    06/23/2013    EXAM DESCRIPTION: CT HEAD/BRAIN W/O CONTRAST  CLINICAL HISTORY: 56 year old female with suspicion for meningitis  TECHNIQUE: A CT scan of the head was performed with 5 mm contiguous axial slices from the  foramen magnum to the skull vertex without the use of intravenous contrast.  This study was performed on equipment that does not supply dose information.  COMPARISON: None available  FINDINGS: There is no evidence of intracranial hemorrhage or extra-axial fluid  collection. There is no midline shift or mass effect.  The ventricles and cisterns are normal in size and configuration. The  gray-white matter interface is preserved.  Paranasal sinuses and mastoid air cells are clear. The orbital contents  are  within normal limits. The osseous structures are grossly intact.  IMPRESSION: No acute intracranial process.  CONCURRENT SUPERVISION: I have reviewed the images and agree with the Resident's interpretation.  DOSE STATEMENT: [facility] CT scanners employ modern techniques for CT dose  reduction, including protocol review, automatic exposure control, and  iterative reconstruction techniques.  These features assure that radiation  dose levels in CT are optimized and are consistent with state-of-the-art, low  dose CT practice.    X-ray Chest Frontal And Lateral    06/23/2013    EXAM DESCRIPTION: Frontal and lateral views of the chest were performed.  CLINICAL HISTORY: Fever  TECHNIQUE: Frontal lateral views of the chest.  COMPARISON: None  FINDINGS: Moderately expanded lungs. The lungs are clear. The cardiomediastinal contours  are normal in size and configuration. No acute bony thorax abnormalities are  identified.  IMPRESSION: No acute cardiopulmonary disease.  CONCURRENT SUPERVISION: Not applicable.    ED Medications Admisitered  Medications   cefTRIAXone  (ROCEPHIN ) 2,000 mg in sodium chloride  0.9 % 50 mL IVPB (2,000 mg IntraVENOUS New Bag 06/24/13 0031)   vancomycin  (VANCOCIN ) 1,500 mg in sodium chloride  0.9 % 250 mL IVPB (not administered)   sodium chloride  0.9 % bolus 1,000 mL (0 mLs IntraVENOUS Completed 06/23/13 2303)   lidocaine -EPINEPHrine  1 %-1:100000 injection 20 mL (20 mLs IntraDERMAL Given by Other 06/23/13 1925)   HYDROmorphone  (DILAUDID ) injection 1 mg (1 mg IntraVENOUS Given 06/23/13 2033)   HYDROmorphone  (DILAUDID ) injection 1 mg (1 mg IntraVENOUS Given 06/23/13 2303)   promethazine  (PHENERGAN ) 25 mg in sodium chloride  0.9 % 50 mL IVPB (0 mg IntraVENOUS Completed 06/24/13 0027)   HYDROmorphone  (DILAUDID ) injection 1 mg (1 mg IntraVENOUS Given 06/24/13 0028)       Impression  Alexandria Proctor is a 57 year old female presents with sx concerning for menigitis. No prior hx of HAs like this.  +generalized sx. Labs notable for WBC 15.9. CT head unremarkable. Attempted LP multiple times but unable to obtain specimine. Will cover empirically and IR may perform LP.     12:38 AM  Spoke with Mariette Shorts who accepts to [facility]   #[id number]  Accepting Dr. Wilhelmenia Harada.    Sherry Blackard, Lovely Rubins, MD  Resident  06/24/13 [phone]

## 2013-06-23 NOTE — ED Notes (Signed)
 Pt states she is allergic to compazine  and is refusing, pt requests phenergan , Dr. Author Board aware

## 2013-06-23 NOTE — ED Procedure Note (Signed)
 Procedure Note  Lumbar Puncture  Date/Time: 06/23/2013 11:56 PM  Performed by: Maylene Spear  Authorized by: [place] Belfast  Consent: Verbal consent obtained. written consent obtained.  Risks and benefits: risks, benefits and alternatives were discussed  Consent given by: patient  Required items: required blood products, implants, devices, and special equipment available  Patient identity confirmed: verbally with patient  Indications: evaluation for infection  Anesthesia: local infiltration  Preparation: Patient was prepped and draped in the usual sterile fashion.  Lumbar space: L3-L4 interspace  Patient's position: left lateral decubitus  Needle gauge: 20  Needle type: spinal needle - quincke tip  Needle length: 1.5 in  Number of attempts: 4  Comments: Unable to obtain specimine

## 2013-06-23 NOTE — ED Notes (Signed)
 Belongings labeled and placed in psych locker

## 2013-06-23 NOTE — ED Notes (Signed)
Resource RN note: Venous blood drawn from IV start and sent to lab

## 2013-06-23 NOTE — ED Notes (Signed)
 Pt requesting pain medication for ead and neck pain, Dr. Author Board aware

## 2013-06-23 NOTE — ED Attending Note (Addendum)
 ED ATTENDING NOTE:    Pt is a 57 year oldfemale with a chief complaint of 10/10 headache x3 days gradually worsening radiating down her spine, no travel no sick contacts. Pt states neck feels stiff. Denies ivda.. This all started three das ago. Pt states she wanted to kill herself because head and neck hurt so badly.  Voice is hoarse as well. + leg cramps. + photophobia, + phonophobia. States allergic to toradol  and reglan , uses phenergan  vomited x6 yesterday.     Case seen and discussed with res. Hp reviewed.i agree with assessment and treatment plan    pmh triage confirmed   Past Medical History   Diagnosis Date   . Manic depression    . Bipolar 1 disorder    . Schizophrenia      Sh no drugs etoh + cigg  Ros neg otherwise all reviewed  fmh noncontributory otherwise    pe stable 57 year old female BP 110/80  Pulse 98  Temp(Src) 100.4 F (38 C)  Resp 18  Ht 5\' 6"  (1.676 m)  Wt 90.719 kg (200 lb)  BMI 32.3 kg/m2  SpO2 98% noted fever sao2 wnl  Lungs ctab no wheezing no rhonchi or rales  Heart s1,s2 rrr no murmur  abd soft bs pos,nt,nm,nr,ng,nd  Throat wnl  Eyes anict 4-2 perrla eom intact  Ears tms wnl  Skin no rashes  Neck supple no nodes diffuse midline pain  Back diffusely tender spine no flank pain  Ext wnl no edema  Neuro face symmetric,   b upper and lower ext intact, symmetric  Severe headache and neck pain dd includes meningitis.  Viral syndrome  Noted elevated wbc, mild anemia,k  Chem wnl  cxr wnl  Will need LP

## 2013-06-24 MED ORDER — HYDROMORPHONE HCL 1 MG/ML IJ SOLN
INTRAMUSCULAR | Status: DC
Start: 2013-06-24 — End: 2013-06-24
  Filled 2013-06-24: qty 1

## 2013-06-24 MED ORDER — VANCOMYCIN HCL 1000 MG IV SOLR
1500.0000 mg | Freq: Once | INTRAVENOUS | Status: AC
Start: 2013-06-24 — End: 2013-06-24
  Administered 2013-06-24: 1500 mg via INTRAVENOUS
  Filled 2013-06-24: qty 1500

## 2013-06-24 MED ORDER — HYDROMORPHONE HCL 1 MG/ML IJ SOLN
1.0000 mg | Freq: Once | INTRAMUSCULAR | Status: AC
Start: 2013-06-24 — End: 2013-06-24
  Administered 2013-06-24: 1 mg via INTRAVENOUS

## 2013-06-24 NOTE — ED Notes (Signed)
 Endorsed care of pt to [facility] RN, pt left ed via gurney with EMT transport personell and RN

## 2013-06-29 ENCOUNTER — Emergency Department
Admission: EM | Admit: 2013-06-29 | Discharge: 2013-06-29 | Disposition: A | Discharge status: Other Acute Inpatient Hospital | Payer: 59 | Attending: Emergency Medicine | Admitting: Emergency Medicine

## 2013-06-29 DIAGNOSIS — R4182 Altered mental status, unspecified: Secondary | ICD-10-CM | POA: Insufficient documentation

## 2013-06-29 LAB — URINALYSIS
Nitrite: NEGATIVE
pH: 6.5 (ref 5.0–8.0)

## 2013-06-29 MED ORDER — NALOXONE HCL 1 MG/ML IJ SOLN
1.00 mg | Freq: Once | INTRAMUSCULAR | Status: AC
Start: 2013-06-29 — End: 2013-06-29
  Administered 2013-06-29: 1 mg via INTRAMUSCULAR
  Filled 2013-06-29: qty 2

## 2013-06-29 MED ORDER — NALOXONE HCL 1 MG/ML IJ SOLN
1.00 mg | Freq: Once | INTRAMUSCULAR | Status: AC
Start: 2013-06-29 — End: 2013-06-29
  Administered 2013-06-29: 1 mg via INTRAVENOUS
  Filled 2013-06-29: qty 2

## 2013-06-29 MED ORDER — NALOXONE HCL 1 MG/ML IJ SOLN
2.0000 mg | INTRAMUSCULAR | Status: DC | PRN
Start: 2013-06-29 — End: 2013-06-29
  Administered 2013-06-29: 2 mg via INTRAVENOUS
  Filled 2013-06-29: qty 2

## 2013-06-29 MED ORDER — NALOXONE HCL 0.4 MG/ML IJ SOLN
0.4000 mg | Freq: Once | INTRAMUSCULAR | Status: DC
Start: 2013-06-29 — End: 2013-06-29

## 2013-06-29 NOTE — ED Notes (Signed)
Narcan 1mg  im given, med student at bedside.

## 2013-06-29 NOTE — ED Provider Notes (Addendum)
History  Chief Complaint   Patient presents with    Numbness     BIBM with c/o right sided upper extremity numbess and left facial pain.     HPI    57 year old female presents for AMS.  The patient reports that she took some percocet earlier in the night and has become extremely drowsy.  She reports that her r hand and r leg is "tingly" (not numb).  Denies any weakness.  Denies any pain.  Denies trauma.    Of note, the patient was seen for a headache on the 1st.  At that time she noted to have an elevated wbc count and received a head ct and attempt at an lp in this er.  They were unable to get an lp so she was transferred to  [facility] for IR guided lp. Per review of records her continued work up at [facility] was negative, including a negative lp.      The patient's history is limited 2ndary to clinical condition    Past Medical History   Diagnosis Date    Manic depression     Bipolar 1 disorder     Schizophrenia        No past surgical history on file.    No family history on file.    History   Substance Use Topics    Smoking status: Not on file    Smokeless tobacco: Not on file    Alcohol Use: Not on file     Sh: history of drug abuse     Review of Systems   Unable to perform ROS      Physical Exam  BP 99/61   Pulse 71   Temp(Src) 98.5 F (36.9 C)   Resp 18   Ht 5\' 5"  (1.651 m)   Wt 87.091 kg (192 lb)   BMI 31.95 kg/m2   SpO2 97%    Physical Exam   Constitutional:   aao x 3   Lethargic, but rousable   HENT:   Head: Normocephalic and atraumatic.   Eyes:   Pupils pinpoint, equal   Neck: Neck supple.   Cardiovascular: Normal rate and normal heart sounds.    Pulmonary/Chest: Effort normal and breath sounds normal. She has no wheezes.   Abdominal: Soft. There is no tenderness.   Musculoskeletal: Normal range of motion.   Neurological:   Cn 2 - 12 intact  Pt strength 5/5 x 4  Remainder of the neuro exam limited by patient condition   Skin: Skin is warm and dry.       ED Course/Medical Decision Making Narrative    57  year old female presents for AMS.   BG WNL.  No traumatic history.  ddx also includes metabolic abnormality, infection, drug overdose, cva, thyroid disorder.  Given the patient's clinic condition and her history have a very high suspicion for polypharmacy overdose.  Her neuro exam is non-focal.  CT head is negative for acute disease.  Given her non-focal neuro exam and her head ct do no think that CVA or bleed as explains her symptoms.  She is afebrile and given her recent negative lp have do not suspect acute cns infection and will not pursue lp at this time.  Pt is not hypoxic on monitor and is protecting her airway (GCS 13).  Hd stable.    Cbc - mild leukocytosis.  Improved from previous.  Otherwise consistent with baseline  cmp - no gross metabolic abnormalities  tsh -  normal  Apap/asa - negative  ua- pending  uds - pending    Given the strong suspcion for polypharmacy overdose + opiate she was given narcan.  She improved incompletely.  Suspect that she has also taken benzos and muscle relaxants (also on her med list from [employer]) which would explain her incomplete response.  We will transfer her to [facility] for further care.  At this time will defer lp for reasons stated above.  Pt remained hd stable in the ER    Impression  ams    dispo  kaiser              Critical Care Time            Additional Notes      Home Medication List  Prior to Admission Medications   Outpatient Medications Last Dose Informant Patient Reported? Taking?   citalopram (CELEXA) 20 MG tablet   No No   Sig: Take 1 tablet by mouth daily.   oxyCODONE-acetaminophen (PERCOCET) 5-325 MG per tablet   No No   Sig: Take 1 tablet by mouth every 6 hours as needed for Severe Pain (Pain Score 7-10).      Facility-Administered Medications: None       Derenda Fennel, MD  Resident  06/29/13 0454    Derenda Fennel, MD  Resident  06/29/13 [phone]

## 2013-06-29 NOTE — ED Notes (Signed)
 Assisting RN: Pt requesting to void. Bedpan used. Sample at bedside for possible order.

## 2013-06-29 NOTE — ED Notes (Signed)
 Pt to ct via gurney

## 2013-06-29 NOTE — ED Notes (Signed)
 Pt medicated with additional dose of narcan  2 mg IVP. Responds to verbal stimuli. Opens eyes. Continues to drift off during conversation.

## 2013-06-29 NOTE — ED Notes (Signed)
 Pt back from CT. Drowsy. Uncooperative. Instructed to raise arms. States she cannot raise arms. Equal hand strength. Resp even and unlabored. Skin warm and dry.

## 2013-06-29 NOTE — ED Attending Note (Signed)
 ED ATTENDING NOTE:    Seen and d/w dr  Sherren Doe and ms ta  agree w/ap as discussed    57 year old female w h/o schizophrneia, bipolr, here for [place] and rle weakness began at 2100 this eve, began with sweating. No trauma. Very difficult historian. Does endorse taking some percocet wont say how many tonuite. Denies si hi avh  cotninually says "yes maam".   Has htn- not on meds?    H/o opioid dependdence.   Glu 102      Past Medical History   Diagnosis Date   . Manic depression    . Bipolar 1 disorder    . Schizophrenia      No past surgical history on file.  No current facility-administered medications for this encounter.     Current Outpatient Prescriptions   Medication Sig   . citalopram  (CELEXA ) 20 MG tablet Take 1 tablet by mouth daily.   . oxyCODONE -acetaminophen  (PERCOCET) 5-325 MG per tablet Take 1 tablet by mouth every 6 hours as needed for Severe Pain (Pain Score 7-10).     History   Substance Use Topics   . Smoking status: Not on file   . Smokeless tobacco: Not on file   . Alcohol Use: Not on file   +tob no etoh or drugs of abuse    fam hx momn had cva at age 64    Ros- aside from above reviewed items, all systems reviewed and are negative.    Exam  BP 133/79  Pulse 77  Temp(Src) 98.5 F (36.9 C)  Resp 20  Ht 5\' 5"  (1.651 m)  Wt 87.091 kg (192 lb)  BMI 31.95 kg/m2  SpO2 93%  Vital signs interpretation: avss,   Wd somenelnt in nad, poorly coopeeraticve with exam  Rhythm strip: nsr no ectopy   Head: perrl eopmsi pinpoint, eomsi, nares clr tms clr, o/p-mmm  Neck: supple no lad  CV: rrr no m  Lungs: ctab  ABD: nabs, sntndno hsm  Back: nontender  Ext: no edema no petech or purp,   Neuro: somnelent, aox2, thinks 1914, eyes closed, moves extrems not cooperative with exam  Says sensaioton in rt foprearm and rt leg decreased gives very poor effort globally, but moves all extrems, no lateralizing to motor exam      Labs/Studies:   Glu normal  ekg nsr no ectopy normal st segs no acute appearing st or tw changes,  normal ekg rate 72    Impression:  Weakness  Sensory deficitis  Odd hisotrian with limited informatioon provided by pt ot us   likley opiate intoxicaiton to some degree    Agree w/a/p as d/w resident

## 2013-06-29 NOTE — ED Notes (Signed)
Pt taken to CT by gurney

## 2013-06-29 NOTE — ED Notes (Signed)
 Bed: 07B  Expected date:   Expected time:   Means of arrival:   Comments:

## 2013-06-29 NOTE — ED Notes (Signed)
 Pt to be transferred to [facility] facility. Update given to [facility] admitting personnel.

## 2013-06-29 NOTE — ED Notes (Signed)
 Received records from [facility], per records recent LP negative.  Patient is on klonopin, lyrica , norco, percocet.      Shellye Dibble, MD  Resident  06/29/13 [phone]

## 2013-06-29 NOTE — ED Notes (Signed)
 Narcan  1mg  iv given dr.lafree at bedside observing pt while narcan  given. Pt remains on monitors.

## 2013-06-29 NOTE — ED Notes (Addendum)
 Pt given 1 mg of narcan  IV with minimal results. Opened eyes to verbal stimuli then drifts off to sleep during conversation. Continues to state she cannot keep her arms up. No s/sx of pain.

## 2013-06-29 NOTE — ED Notes (Addendum)
 Pt responding to narcan . More alert, but drowsy.  Answering questions appropriately. Speech is more clear. Dr Sherren Doe at bedside

## 2013-06-29 NOTE — ED Notes (Signed)
 Pt was able to remove her pants with no difficulty using full strength. No weakness to upper extremities while she removed her pants.

## 2013-06-30 LAB — ECG 12-LEAD
QRS INTERVAL/DURATION: 96 ms
VENTRICULAR RATE: 72 {beats}/min

## 2013-08-22 ENCOUNTER — Emergency Department
Admission: EM | Admit: 2013-08-22 | Discharge: 2013-08-23 | Disposition: A | Discharge status: Home / Self Care | Payer: 59 | Attending: Emergency Medicine | Admitting: Emergency Medicine

## 2013-08-22 DIAGNOSIS — R0602 Shortness of breath: Secondary | ICD-10-CM | POA: Insufficient documentation

## 2013-08-22 DIAGNOSIS — Z9884 Bariatric surgery status: Secondary | ICD-10-CM | POA: Insufficient documentation

## 2013-08-22 DIAGNOSIS — R109 Unspecified abdominal pain: Secondary | ICD-10-CM | POA: Insufficient documentation

## 2013-08-22 DIAGNOSIS — R112 Nausea with vomiting, unspecified: Secondary | ICD-10-CM | POA: Insufficient documentation

## 2013-08-22 DIAGNOSIS — R197 Diarrhea, unspecified: Secondary | ICD-10-CM | POA: Insufficient documentation

## 2013-08-22 LAB — URINALYSIS
Bilirubin: NEGATIVE
Blood: NEGATIVE
Glucose: NEGATIVE
Ketones: NEGATIVE
Leuk Esterase: NEGATIVE
Nitrite: NEGATIVE
Protein: NEGATIVE
RBC: <1 (ref 0–?)
Specific Gravity: 1.022 (ref 1.002–1.030)
WBC: <1 (ref 0–?)
pH: 6 (ref 5.0–8.0)

## 2013-08-22 LAB — CBC WITH DIFF, BLOOD
ANC-Automated: 6.1 10*3/uL (ref 1.6–7.0)
Abs Eosinophils: 0.1 10*3/uL (ref 0.1–0.5)
Abs Lymphs: 1 10*3/uL (ref 0.8–3.1)
Abs Monos: 0.9 10*3/uL — ABNORMAL HIGH (ref 0.2–0.8)
Eosinophils: 2 % (ref 1–4)
Hct: 38.2 % (ref 34.0–45.0)
Hgb: 12.7 gm/dL (ref 11.2–15.7)
Lymphocytes: 13 % — ABNORMAL LOW (ref 19–53)
MCH: 26.3 pg (ref 26.0–32.0)
MCHC: 33.2 % (ref 32.0–36.0)
MCV: 79.1 um3 (ref 79.0–95.0)
MPV: 9.3 fL — ABNORMAL LOW (ref 9.4–12.4)
Monocytes: 11 % (ref 5–12)
Plt Count: 289 10*3/uL (ref 140–370)
RBC: 4.83 10*6/uL (ref 3.90–5.20)
RDW: 17.3 % — ABNORMAL HIGH (ref 12.0–14.0)
Segs: 75 % — ABNORMAL HIGH (ref 34–71)
WBC: 8.1 10*3/uL (ref 4.0–10.0)

## 2013-08-22 LAB — COMPREHENSIVE METABOLIC PANEL, BLOOD
ALT (SGPT): 57 U/L — ABNORMAL HIGH (ref 0–33)
AST (SGOT): 32 U/L (ref 0–32)
Albumin: 4.3 g/dL (ref 3.5–5.2)
Alkaline Phos: 99 U/L (ref 35–140)
BUN: 18 mg/dL (ref 6–20)
Bicarbonate: 29 mmol/L (ref 22–29)
Bilirubin, Tot: 0.15 mg/dL (ref <1.20)
Calcium: 9.4 mg/dL (ref 8.6–10.0)
Chloride: 105 mmol/L (ref 98–107)
Creatinine: 1.09 mg/dL — ABNORMAL HIGH (ref 0.51–0.95)
GFR: >60 mL/min
Glucose: 92 mg/dL (ref 70–115)
Potassium: 3.7 mmol/L (ref 3.5–5.1)
Sodium: 141 mmol/L (ref 136–145)
Total Protein: 7.2 g/dL (ref 6.0–8.0)

## 2013-08-22 LAB — CKMB+INDEX (NO CPK), BLOOD
CK-MB Index: 2 %
CK-MB: 2 ng/mL (ref 0.0–2.8)

## 2013-08-22 LAB — LIPASE, BLOOD: Lipase: 28 U/L (ref 13–60)

## 2013-08-22 LAB — CPK-CREATINE PHOSPHOKINASE, BLOOD: CPK: 101 U/L (ref 0–175)

## 2013-08-22 LAB — TROPONIN T, BLOOD: Troponin T: <0.01 ng/mL (ref <0.01)

## 2013-08-22 MED ORDER — MORPHINE SULFATE 4 MG/ML IJ SOLN
4.00 mg | Freq: Once | INTRAMUSCULAR | Status: AC
Start: 2013-08-22 — End: 2013-08-22
  Administered 2013-08-22: 4 mg via INTRAVENOUS
  Filled 2013-08-22: qty 1

## 2013-08-22 MED ORDER — PROMETHAZINE HCL 25 MG/ML IJ SOLN
12.50 mg | Freq: Once | INTRAMUSCULAR | Status: AC
Start: 2013-08-23 — End: 2013-08-23
  Administered 2013-08-23: 12.5 mg via INTRAVENOUS
  Filled 2013-08-22: qty 0.5

## 2013-08-22 MED ORDER — SODIUM CHLORIDE 0.9 % IV SOLN
Freq: Once | INTRAVENOUS | Status: AC
Start: 2013-08-22 — End: 2013-08-23

## 2013-08-22 MED ORDER — PANTOPRAZOLE SODIUM 20 MG OR TBEC
20.00 mg | DELAYED_RELEASE_TABLET | Freq: Every day | ORAL | Status: DC
Start: ? — End: 2018-04-20

## 2013-08-22 MED ORDER — SODIUM CHLORIDE 0.9 % IV SOLN
12.50 mg | Freq: Once | INTRAVENOUS | Status: AC
Start: 2013-08-22 — End: 2013-08-22
  Administered 2013-08-22: 12.5 mg via INTRAVENOUS
  Filled 2013-08-22: qty 0.5

## 2013-08-22 MED ORDER — MORPHINE SULFATE 4 MG/ML IJ SOLN
4.00 mg | Freq: Once | INTRAMUSCULAR | Status: AC
Start: 2013-08-23 — End: 2013-08-23
  Administered 2013-08-23: 4 mg via INTRAVENOUS
  Filled 2013-08-22: qty 1

## 2013-08-22 NOTE — ED Notes (Signed)
 08/22/2013 6:03 PM Alexandria Proctor    An EKG was handed to Dr. Artemio Larry.

## 2013-08-23 ENCOUNTER — Emergency Department
Admission: EM | Admit: 2013-08-23 | Discharge: 2013-08-23 | Disposition: A | Discharge status: Other Acute Inpatient Hospital | Payer: 59 | Attending: Emergency Medicine | Admitting: Emergency Medicine

## 2013-08-23 ENCOUNTER — Encounter (HOSPITAL_COMMUNITY): Payer: Self-pay | Admitting: Emergency Medicine

## 2013-08-23 DIAGNOSIS — R197 Diarrhea, unspecified: Secondary | ICD-10-CM | POA: Insufficient documentation

## 2013-08-23 DIAGNOSIS — R109 Unspecified abdominal pain: Secondary | ICD-10-CM | POA: Insufficient documentation

## 2013-08-23 DIAGNOSIS — Z9884 Bariatric surgery status: Secondary | ICD-10-CM | POA: Insufficient documentation

## 2013-08-23 DIAGNOSIS — K2940 Chronic atrophic gastritis without bleeding: Secondary | ICD-10-CM | POA: Insufficient documentation

## 2013-08-23 DIAGNOSIS — Z8659 Personal history of other mental and behavioral disorders: Secondary | ICD-10-CM | POA: Insufficient documentation

## 2013-08-23 DIAGNOSIS — R112 Nausea with vomiting, unspecified: Secondary | ICD-10-CM | POA: Insufficient documentation

## 2013-08-23 LAB — LACTATE, BLOOD: Lactate: 12.6 mg/dL (ref 4.5–19.8)

## 2013-08-23 MED ORDER — LIDOCAINE VISCOUS 2 % MT SOLN
10.00 mL | Freq: Once | OROMUCOSAL | Status: AC
Start: 2013-08-23 — End: 2013-08-23
  Administered 2013-08-23: 10 mL via ORAL
  Filled 2013-08-23: qty 15

## 2013-08-23 MED ORDER — SODIUM CHLORIDE 0.9 % IV BOLUS
1000.0000 mL | INJECTION | Freq: Once | INTRAVENOUS | Status: DC
Start: 2013-08-23 — End: 2013-08-23

## 2013-08-23 MED ORDER — PROMETHAZINE HCL 25 MG/ML IJ SOLN
25.0000 mg | Freq: Four times a day (QID) | INTRAMUSCULAR | Status: DC | PRN
Start: 2013-08-23 — End: 2013-08-23

## 2013-08-23 MED ORDER — PB-HYOSCY-ATROPINE-SCOPOLAMINE 16.2 MG/5ML PO ELIX
10.00 mL | ORAL_SOLUTION | Freq: Once | ORAL | Status: AC
Start: 2013-08-23 — End: 2013-08-23
  Administered 2013-08-23: 10 mL via ORAL
  Filled 2013-08-23: qty 10

## 2013-08-23 MED ORDER — PROMETHAZINE HCL 25 MG OR TABS
25.0000 mg | ORAL_TABLET | Freq: Three times a day (TID) | ORAL | Status: DC | PRN
Start: 2013-08-23 — End: 2018-01-05

## 2013-08-23 MED ORDER — HYDROCODONE-ACETAMINOPHEN 5-325 MG OR TABS
1.0000 | ORAL_TABLET | Freq: Three times a day (TID) | ORAL | Status: DC | PRN
Start: 2013-08-23 — End: 2013-08-23

## 2013-08-23 MED ORDER — PROMETHAZINE HCL 25 MG OR TABS
25.00 mg | ORAL_TABLET | Freq: Once | ORAL | Status: AC
Start: 2013-08-23 — End: 2013-08-23
  Administered 2013-08-23: 25 mg via ORAL
  Filled 2013-08-23: qty 1

## 2013-08-23 MED ORDER — METOCLOPRAMIDE HCL 5 MG OR TABS
5.0000 mg | ORAL_TABLET | Freq: Three times a day (TID) | ORAL | Status: DC | PRN
Start: 2013-08-23 — End: 2013-08-23

## 2013-08-23 MED ORDER — OXYCODONE-ACETAMINOPHEN 5-325 MG OR TABS
1.00 | ORAL_TABLET | Freq: Once | ORAL | Status: AC
Start: 2013-08-23 — End: 2013-08-23
  Administered 2013-08-23: 1 via ORAL
  Filled 2013-08-23: qty 1

## 2013-08-23 MED ORDER — FAMOTIDINE 20 MG OR TABS
20.0000 mg | ORAL_TABLET | Freq: Two times a day (BID) | ORAL | Status: DC
Start: 2013-08-23 — End: 2015-09-25

## 2013-08-23 MED ORDER — HYDROCODONE-ACETAMINOPHEN 5-325 MG OR TABS
1.0000 | ORAL_TABLET | Freq: Three times a day (TID) | ORAL | Status: DC | PRN
Start: 2013-08-23 — End: 2015-09-25

## 2013-08-23 MED ORDER — CALCIUM CARBONATE ANTACID 500 MG OR CHEW
500.00 mg | CHEWABLE_TABLET | Freq: Once | ORAL | Status: AC
Start: 2013-08-23 — End: 2013-08-23
  Administered 2013-08-23: 500 mg via ORAL
  Filled 2013-08-23: qty 1

## 2013-08-23 MED ORDER — FAMOTIDINE 20 MG OR TABS
20.00 mg | ORAL_TABLET | Freq: Once | ORAL | Status: AC
Start: 2013-08-23 — End: 2013-08-23
  Administered 2013-08-23: 20 mg via ORAL
  Filled 2013-08-23: qty 1

## 2013-08-23 MED ORDER — ALUM & MAG HYDROXIDE-SIMETH 200-200-20 MG/5ML OR SUSP
30.00 mL | Freq: Once | ORAL | Status: AC
Start: 2013-08-23 — End: 2013-08-23
  Administered 2013-08-23: 30 mL via ORAL
  Filled 2013-08-23: qty 30

## 2013-08-23 MED ORDER — FAMOTIDINE 20 MG OR TABS
20.0000 mg | ORAL_TABLET | Freq: Two times a day (BID) | ORAL | Status: DC
Start: 2013-08-23 — End: 2013-08-23

## 2013-08-23 NOTE — ED Notes (Signed)
Assumed Care at 3:17 AM from [facility] Riggs.*  CC:   Chief Complaint   Patient presents with    Abdominal Pain     She states that she has had severe abdominal pain and diarrhea for the last 4 days, she also states that she has had some SOB.      Case Summary:  H/o gastric bypass 2009, severe abd pain n/v/d -- nonbloody, unable to take po. Subjective abdominal distension.    Neg cardiac workup    CT formal read pending.  Final dispo based on completed workup.  H/o chronic opiate abuse    Following signout CT unremarkable, tolerated PO, comfortable with discharge.          Tama Headings, MD  08/23/13 [phone]

## 2013-08-23 NOTE — ED Notes (Signed)
MD Park Breed at bedside to attempt [place] IV access.  Pt tolerating without incident.

## 2013-08-23 NOTE — ED Notes (Signed)
Pt remains without IV access, minimal samples for ordered.

## 2013-08-24 LAB — ECG 12-LEAD
ATRIAL RATE: 79 {beats}/min
ECG INTERPRETATION: NORMAL
P AXIS: 23 degrees
PR INTERVAL: 116 ms
QRS INTERVAL/DURATION: 80 ms
QT: 386 ms
QTC INTERVAL: 442 ms
R AXIS: -3 degrees
T AXIS: 62 degrees
VENTRICULAR RATE: 79 {beats}/min

## 2013-09-02 NOTE — ED Follow-up Note (Signed)
 Follow-up type: Callback       Routine ED Patient Call Back    Patient unable to be contacted, no message left

## 2014-07-29 ENCOUNTER — Emergency Department
Admission: EM | Admit: 2014-07-29 | Discharge: 2014-07-29 | Disposition: A | Discharge status: Home / Self Care | Payer: 59 | Attending: Emergency Medicine | Admitting: Emergency Medicine

## 2014-07-29 DIAGNOSIS — Z9884 Bariatric surgery status: Secondary | ICD-10-CM | POA: Insufficient documentation

## 2014-07-29 DIAGNOSIS — R531 Weakness: Secondary | ICD-10-CM | POA: Insufficient documentation

## 2014-07-29 DIAGNOSIS — R509 Fever, unspecified: Secondary | ICD-10-CM | POA: Insufficient documentation

## 2014-07-29 DIAGNOSIS — R112 Nausea with vomiting, unspecified: Secondary | ICD-10-CM | POA: Insufficient documentation

## 2014-07-29 DIAGNOSIS — R519 Headache, unspecified: Secondary | ICD-10-CM

## 2014-07-29 DIAGNOSIS — F209 Schizophrenia, unspecified: Secondary | ICD-10-CM | POA: Insufficient documentation

## 2014-07-29 DIAGNOSIS — Z803 Family history of malignant neoplasm of breast: Secondary | ICD-10-CM | POA: Insufficient documentation

## 2014-07-29 DIAGNOSIS — R51 Headache: Principal | ICD-10-CM | POA: Insufficient documentation

## 2014-07-29 DIAGNOSIS — F17210 Nicotine dependence, cigarettes, uncomplicated: Secondary | ICD-10-CM | POA: Insufficient documentation

## 2014-07-29 LAB — INFLUENZA A/B PANEL (CALM LAB)
Influenza A, Rapid: NEGATIVE
Influenza B, Rapid: NEGATIVE

## 2014-07-29 LAB — TOTAL PROTEIN, CSF: Total Protein, CSF: 24 mg/dL (ref 15–45)

## 2014-07-29 LAB — PROTHROMBIN TIME, BLOOD
INR: 1
PT,Patient: 10.8 s (ref 9.7–12.5)

## 2014-07-29 LAB — CBC WITH DIFF, BLOOD
ANC-Manual Mode: 5.9 10*3/uL (ref 1.6–7.0)
Abs Eosinophils: 0.2 10*3/uL (ref 0.1–0.7)
Abs Lymphs: 1 10*3/uL (ref 0.8–3.1)
Abs Monos: 1.4 10*3/uL — ABNORMAL HIGH (ref 0.2–0.8)
BANDS: 7 % (ref 0–15)
Eosinophils: 2 % (ref 1–4)
Hct: 33.2 % — ABNORMAL LOW (ref 34.0–45.0)
Hgb: 10.9 gm/dL — ABNORMAL LOW (ref 11.2–15.7)
LYMPHOCYTES: 12 % — ABNORMAL LOW (ref 19–53)
MCH: 26.7 pg (ref 26.0–32.0)
MCHC: 32.8 % (ref 32.0–36.0)
MCV: 81.2 um3 (ref 79.0–95.0)
MPV: 10 fL (ref 9.4–12.4)
Monocytes: 17 % — ABNORMAL HIGH (ref 5–12)
Number of Cells Counted: 117
Plt Count: 247 10*3/uL (ref 140–370)
Plt Est: NORMAL
RBC: 4.09 10*6/uL (ref 3.90–5.20)
RDW: 18.2 % — ABNORMAL HIGH (ref 12.0–14.0)
Segs: 62 % (ref 34–71)
WBC: 8.5 10*3/uL (ref 4.0–10.0)

## 2014-07-29 LAB — BASIC METABOLIC PANEL, BLOOD
ANION GAP: 11 mmol/L (ref 7–15)
BUN: 18 mg/dL (ref 6–20)
Bicarbonate: 28 mmol/L (ref 22–29)
Calcium: 8.9 mg/dL (ref 8.5–10.6)
Chloride: 101 mmol/L (ref 98–107)
Creatinine: 1.18 mg/dL — ABNORMAL HIGH (ref 0.51–0.95)
GFR: 56 mL/min
Glucose: 95 mg/dL (ref 70–115)
Potassium: 4.1 mmol/L (ref 3.5–5.1)
Sodium: 140 mmol/L (ref 136–145)

## 2014-07-29 LAB — CSF CELL COUNT / DIFF
RBC CSF mm3: 19 uL
RBC CSF mm3: 508 uL
WBC CSF mm3: 1 uL (ref 0–10)
WBC CSF mm3: 1 uL (ref 0–10)

## 2014-07-29 LAB — GLUCOSE, CSF: Glucose, CSF: 56 mg/dL (ref 40–70)

## 2014-07-29 LAB — APTT, BLOOD: PTT: 30.1 s (ref 25.0–34.0)

## 2014-07-29 LAB — SED RATE, BLOOD: Sed Rate: 32 mm/hr — ABNORMAL HIGH (ref 0–30)

## 2014-07-29 LAB — LACTATE, CSF: Lactate, CSF: 1.5 mmol/L (ref 1.1–2.4)

## 2014-07-29 LAB — C-REACTIVE PROTEIN, BLOOD: CRP: 8 mg/dL — ABNORMAL HIGH (ref <0.5)

## 2014-07-29 MED ORDER — LORAZEPAM 2 MG/ML IJ SOLN
0.5000 mg | Freq: Once | INTRAMUSCULAR | Status: AC
Start: 2014-07-29 — End: 2014-07-29
  Administered 2014-07-29: 0.5 mg via INTRAVENOUS
  Filled 2014-07-29: qty 1

## 2014-07-29 MED ORDER — HYDROMORPHONE HCL 1 MG/ML IJ SOLN
0.5000 mg | Freq: Once | INTRAMUSCULAR | Status: DC
Start: 2014-07-29 — End: 2014-07-29

## 2014-07-29 MED ORDER — LIDOCAINE HCL 1 % IJ SOLN
10.0000 mL | Freq: Once | INTRAMUSCULAR | Status: DC
Start: 2014-07-29 — End: 2014-07-30
  Filled 2014-07-29: qty 20

## 2014-07-29 MED ORDER — BUPROPION HCL 100 MG OR TABS
100.00 mg | ORAL_TABLET | Freq: Three times a day (TID) | ORAL | Status: DC
Start: ? — End: 2018-04-20

## 2014-07-29 MED ORDER — HYDROMORPHONE HCL 1 MG/ML IJ SOLN
1.0000 mg | Freq: Once | INTRAMUSCULAR | Status: AC
Start: 2014-07-29 — End: 2014-07-29
  Administered 2014-07-29: 1 mg via INTRAMUSCULAR
  Filled 2014-07-29: qty 1

## 2014-07-29 MED ORDER — HYDROMORPHONE HCL 1 MG/ML IJ SOLN
0.5000 mg | Freq: Once | INTRAMUSCULAR | Status: DC
Start: 2014-07-29 — End: 2014-07-30
  Filled 2014-07-29: qty 0.5

## 2014-07-29 MED ORDER — SODIUM CHLORIDE 0.9 % IV SOLN
12.5000 mg | Freq: Once | INTRAVENOUS | Status: DC
Start: 2014-07-29 — End: 2014-07-29
  Filled 2014-07-29: qty 0.5

## 2014-07-29 MED ORDER — SODIUM CHLORIDE 0.9 % IV BOLUS
1000.0000 mL | INJECTION | Freq: Once | INTRAVENOUS | Status: AC
Start: 2014-07-29 — End: 2014-07-29
  Administered 2014-07-29: 1000 mL via INTRAVENOUS

## 2014-07-29 MED ORDER — PROMETHAZINE HCL 25 MG/ML IJ SOLN
25.0000 mg | Freq: Once | INTRAMUSCULAR | Status: AC
Start: 2014-07-29 — End: 2014-07-29
  Administered 2014-07-29: 25 mg via INTRAMUSCULAR
  Filled 2014-07-29: qty 1

## 2014-07-29 MED ORDER — HYDROMORPHONE HCL 1 MG/ML IJ SOLN
1.0000 mg | Freq: Once | INTRAMUSCULAR | Status: AC
Start: 2014-07-29 — End: 2014-07-29
  Administered 2014-07-29: 1 mg via INTRAVENOUS
  Filled 2014-07-29: qty 1

## 2014-07-29 MED ORDER — SODIUM CHLORIDE 0.9 % IV BOLUS
500.0000 mL | INJECTION | Freq: Once | INTRAVENOUS | Status: AC
Start: 2014-07-29 — End: 2014-07-29
  Administered 2014-07-29: 500 mL via INTRAVENOUS

## 2014-07-29 NOTE — ED MD Progress Note (Signed)
ED COURSE:    Labs:  Results for orders placed or performed during the hospital encounter of 07/29/14   BASIC METABOLIC PANEL, BLOOD   Result Value Ref Range    Glucose 95 70 - 115 mg/dL    BUN 18 6 - 20 mg/dL    Creatinine 1.611.18 (H) 0.51 - 0.95 mg/dL    GFR 56 mL/min    Sodium 140 136 - 145 mmol/L    Potassium 4.1 3.5 - 5.1 mmol/L    Chloride 101 98 - 107 mmol/L    Bicarbonate 28 22 - 29 mmol/L    Anion Gap 11 7 - 15 mmol/L    Calcium 8.9 8.5 - 10.6 mg/dL   C-REACTIVE PROTEIN, BLOOD   Result Value Ref Range    CRP 8.00 (H) <0.5 mg/dL   APTT, BLOOD   Result Value Ref Range    PTT 30.1 25.0 - 34.0 sec   PROTHROMBIN TIME, BLOOD   Result Value Ref Range    PT,Patient 10.8 9.7 - 12.5 sec    INR 1.0    CBC WITH ADIFF, BLOOD   Result Value Ref Range    WBC 8.5 4.0 - 10.0 1000/mm3    RBC 4.09 3.90 - 5.20 mill/mm3    Hgb 10.9 (L) 11.2 - 15.7 gm/dL    Hct 09.633.2 (L) 04.534.0 - 45.0 %    MCV 81.2 79.0 - 95.0 um3    MCH 26.7 26.0 - 32.0 pgm    MCHC 32.8 32.0 - 36.0 %    RDW 18.2 (H) 12.0 - 14.0 %    MPV 10.0 9.4 - 12.4 fL    Plt Count 247 140 - 370 1000/mm3    Segs 62 34 - 71 %    Lymphocytes 12 (L) 19 - 53 %    Monocytes 17 (H) 5 - 12 %    Eosinophils 2 <1-4 %    ANC-Manual Mode 5.9 1.6 - 7.0 1000/mm3    Abs Lymphs 1.0 0.8 - 3.1 1000/mm3    Abs Monos 1.4 (H) 0.2 - 0.8 1000/mm3    Abs Eosinophils 0.2 <0.1-0.7 1000/mm3    Bands 7 0 - 15 %    Number of Cells Counted 117     Diff Type Manual     Plt Est Normal     Microcytic 1+     Macrocytic 1+     Poikilocytosis 1+     Anisocytosis 1+     Rouleaux Present     Target Cells 1+          Imaging:  Ct Head W/o Contrast    07/29/2014   EXAM DESCRIPTION: CT HEAD/BRAIN W/O CONTRAST11/03/2014 5:00 pm  CLINICAL HISTORY: New onset headache  TECHNIQUE: A CT scan of the head was performed with 5 mm contiguous axial slices from the  foramen magnum to the skull vertex without the use of intravenous contrast. "Source images were also reformatted into 2mm coronal and sagittal images."           No  Results.  COMPARISON: CT head 06/29/2013 and 06/23/2013  FINDINGS: The ventricles and sulci are normal for age.  No mass-effect or midline shift  is present, and the basal cisterns are patent. There is no evidence of acute  intracranial hemorrhage.  The soft tissues of the scalp are normal.  The calvarium and skull base are  normal without evidence of fracture or other abnormality. Small mucous  retention cyst in the right maxillary sinus, partially  imaged. The visualized  paranasal sinuses and mastoid air cells are otherwise clear.  IMPRESSION: No acute intracranial findings.  CONCURRENT SUPERVISION: I have reviewed the images and agree with the Resident's interpretation.  DOSE STATEMENT: [facility] CT scanners employ modern techniques for CT dose  reduction, including protocol review, automatic exposure control, and  iterative reconstruction techniques.  These features assure that radiation  dose levels in CT are optimized and are consistent with state-of-the-art, low  dose CT practice.      Procedures:   Orders Placed This Encounter   Procedures    Central Line    Lumbar Puncture         Medications Given:  Medications   sodium chloride 0.9 % bolus 500 mL (0 mLs IntraVENOUS Completed 07/29/14 2038)   promethazine (PHENERGAN) injection 25 mg (25 mg IntraMUSCULAR Given 07/29/14 1443)   HYDROmorphone (DILAUDID) injection 1 mg (1 mg IntraMUSCULAR Given 07/29/14 1445)   HYDROmorphone (DILAUDID) injection 1 mg (1 mg IntraMUSCULAR Given 07/29/14 1710)   HYDROmorphone (DILAUDID) injection 1 mg (1 mg IntraVENOUS Given 07/29/14 1845)   LORazepam (ATIVAN) injection 0.5 mg (0.5 mg IntraVENOUS Given 07/29/14 1920)   sodium chloride 0.9 % bolus 1,000 mL (1,000 mLs IntraVENOUS New Bag 07/29/14 2122)         Medical Decision Making:  - pt w/persistent severe HA despite multiple rounds of dilaudid; allergic to most other medications available for HA/migraines  - CT head unremarkable  - pt also with odd behavior,  repeating words over and over, somewhat somnolent   - neurology consulted, low suspicion for stroke or other acute process, doubted TA or venous thrombosis  - pt reported fever to 102F yesterday, but afebrile in the ER, and no leukocytosis  - however, CRP markedly elevated at 8, concerning for underlying infectious process  - LP performed and also not c/w infection or SAH   - [facility]    - however, MD reported that [facility], exhibits drug-seeking behavior routinely,     and was just seen in the ER yesterday where she had also had a CT head and LP performed    - pt had clearly stated in the ED today that she had never had an LP before, and failed to report that     [facility] same chief complaint and had CT/LP performed   - also [facility]  - given the above information, suspicion for an acute process causing pt's pain/headache is very low, strongly    favor drug seeking behavior  - etiology of pt's elevated CRP unclear, likely a chronic process  - pt discharged home     The patient indicates understanding of these issues and agrees to the plan.    Overall Impression: headache, migraine    Consults: neurology    Disposition: discharge home       Discharge Medication List as of 07/29/2014 10:30 PM         No orders of the defined types were placed in this encounter.     No future appointments.    Discussed with attending physician, [place], Rosaria FerriesAlicia Bethany, MD

## 2014-07-29 NOTE — ED Notes (Signed)
Finally found access for IV

## 2014-07-29 NOTE — Consults (Signed)
Neurology Consult Note    Patient Name: Alexandria Proctor  Age:  58 year old Sex: female  MRN: [id number]    Location: 18/18  DOA: 07/29/2014,   LOS: 0 days      Reason for Consult: Headache and palilalia    History of Present Illness:   This is a 58 year old RH female with a history notable for   Patient reports that she has had headache for four days, diffuse, throbbing/pulsating, stabbing, non-radiating,  9/10. Associated with photophobia, nausea and vomiting. Unable to keep food down because of nausea. Also complains of very stiff neck starting yesterday, as well as eye pain when moving eyes. Non-positional. Minimal improvement with phenergan and dilaudid. Never had headache like this in past. Slow onset. Reports that she had fever of 102 last night. Feels some generalized weakness. Denies changes in vision, focal weakness, sensory changes.    Repeatedly requesting pain medication during interview.    Review of Systems: As in HPI, otherwise negative.  Systems reviewed: General, Skin, Eye, ENT, Oral, Cardio, Pulmonary, Gastrointestinal, Renal/Urinary, Hematologic, Endocrine/Metabolic, Peripheral Vascular, Musculoskeletal, Neurological.    Past Medical History:  Past Medical History   Diagnosis Date    Manic depression     Bipolar 1 disorder     Schizophrenia      Past Surgical History:  Gastric Bypass - 2008  Right Knee Surgery    Medications:  No current facility-administered medications on file prior to encounter.     Current Outpatient Prescriptions on File Prior to Encounter   Medication Sig Dispense Refill    famotidine (PEPCID) 20 MG tablet Take 1 tablet by mouth 2 times daily. 40 tablet 0    HYDROcodone-acetaminophen (NORCO) 5-325 MG tablet Take 1 tablet by mouth every 8 hours as needed for Moderate Pain (Pain Score 4-6). 6 tablet 0    pantoprazole (PROTONIX) 20 MG tablet Take 20 mg by mouth daily.      promethazine (PHENERGAN) 25 MG tablet Take 1 tablet by mouth every 8 hours as needed for Nausea. 9  tablet 0   Tylenol prn    Allergies:  Allergies   Allergen Reactions    Compazine Rash    Reglan [Metoclopramide] Anaphylaxis    Toradol Anaphylaxis    Tramadol Unspecified     Per patient, tolerates dilaudid    Zofran [Ondansetron] Unspecified     Family History:  Sister - breast cancer, deceased    Social History:  Smoking: occasional - 1/2ppd x5 years  EtOH: Denies  Drugs: Denies  L[place] [address] [place]. Daughter lives nearby. Retired Therapist, sports.    Physical Exam:  Filed Vitals:    07/29/14 1254 07/29/14 1645 07/29/14 2015   BP: 101/74 101/61 113/63   Pulse: 80 70 68   Temp: 98.2 F (36.8 C) 97.6 F (36.4 C) 97.8 F (36.6 C)   Resp: '18 18 18   ' Height: '5\' 5"'  (1.651 m)     Weight: 93.895 kg (207 lb)     SpO2: 95% 96% 95%   General: Fluctuating dramatically from NAD to screaming in pain  Head: Scalp tenderness palpation throughout, no clear focal temporal tenderness at jaw/temporal area out of proportion to other areas of head  Eyes: no conjunctival injection  Fundi: patient refused exam  Throat: oropharynx clear, no ulcers, no exudates, moist mucus membranes  Neck: Unable to fully assess due to patient refusal - unclear if meningismus present, complain of mild tenderness to palpation  CV: RRR  Pulm: CTAB  Abd: Soft, NT/ND +BS  Back: normal curvature, no spinal tenderness  Extremities: warm, no clubbing, cyanosis, or edema.    Neurological Exam: Significantly limited due to patient cooperation - no clear focal abnormalities noted  Mental Status:  Alert, attentive, and cooperative.  Oriented to person, place, and time.  Normal language output and comprehension. Naming and repetition intact.    Cranial Nerves:  II: PERRL, unable to assess visual fields due to patient refusal. Attempted to test visual acuity with Snellen and patient 20/70 in both eyes but with limited motivation  III, IV, VI: EOMI grossly intact without nystagmus  V: facial sensation intact bilaterally, muscles of mastication with normal  strength bilaterally  VII: face symmetric, no nasolabial flattening, no delay in smile excursion  VIII: hearing intact to finger rub and conversational voice, no nystagmus  IX, X: palate elevation symmetric, no hoarseness of voice  XI: 5/5 strength in trapezius and SCM bilaterally  XII: tongue protrusion is midline    Motor:  Normal bulk and tone.  No tremors or dyskinesias.  No pronator drift.    Shoulder Abduction:  right: 5/5    left: 5/5  Shoulder Internal Rotation:  right: 5/5    left: 5/5  Shoulder External Rotation: right: 5/5    left: 5/5  Biceps:  right: 5/5    left: 5/5  Triceps:  right: 5/5    left: 5/5  Wrist Extensors:  right: 5/5    left: 5/5  Wrist Flexors:  right: 5/5    left: 5/5  Finger Extensors:  right: 5/5    left: 5/5  Grip:  right: 5/5    left: 5/5  Interossei:  right: 5/5    left: 5/5  Hip Flexors:  right: 5/5    left: 5/5  Hip Extensors:  right: 5/5    left: 5/5  Hip Abductors:  right: 5/5    left: 5/5  Hip Adductors:  right: 5/5    left: 5/5  Knee Flexors:  right: 5/5    left: 5/5  Knee Extensors:  right: 5/5    left: 5/5  Ankle Dorsiflexors:  right: 5/5    left: 5/5  Ankle Plantarflexors:  right: 5/5    left: 5/5    Coordination:  Intact finger tapping and rapid alternating movements.  Intact finger-nose-finger and heel/shin testing bilaterally.    Sensory:  Light touch: intact in all extremities  Position sense: intact in all extremities  Vibration: intact in all extremities  Temperature: intact in all extremities  Pinprick: intact in all extremities    Reflexes:  Biceps:  right: 2    left: 2  Triceps:  right: 2    left: 2  Brachioradialis:  right: 2    left: 2  Patella:  right: 2    left: 2  Achilles:  right: 2    left: 2  Toes: right: down    left: down    Gait and Stance:  Normal based stance.  Normal gait with good arm swing and normal turns.  Normal tip toe and heel walking.  Intact tandem walking.  Romberg test negative.    Labs:  CBC  Recent Labs      07/29/14   1542   WBC  8.5      HGB  10.9*   HCT  33.2*   MCV  81.2   RDW  18.2*   PLT  247   BAND  7   SEG  62  LYMPHS  12*   MONOS  17*      Chemistry  Recent Labs      07/29/14   1542   NA  140   K  4.1   CL  101   BICARB  28   BUN  18   CREAT  1.18*   GLU  95   [place]  8.9      Coags  Recent Labs      07/29/14   1709   PT  10.8   PTT  30.1   INR  1.0     Lab Results   Component Value Date    CLARITYCSF Clear 07/29/2014    CLARITYCSF Clear 07/29/2014    COLORCSF Colorless 07/29/2014    COLORCSF Colorless 07/29/2014    WBCCSF 1 07/29/2014    WBCCSF 1 07/29/2014    RBCCSF 508 07/29/2014    RBCCSF 19 07/29/2014    GLUCSF 56 07/29/2014    TPCSF 24 07/29/2014     CRP 8  ESR 32    Imaging:   CTH w/o contrast, 07/29/14:  IMPRESSION:  No acute intracranial findings.    Assessment/Plan:  SARYIAH BENCOSME is a 58 year old female who presents with headache, questionable fever and reported episodes of word repetition (palilalia). Unfortunately, history and examination of patient was quite limited due to patient cooperation however history reveals non-specific diffuse headache, photophobia, nausea, vomiting. She also complained of neck stiffness and jaw claudication (though not to this examiner). Limited neurologic exam was non-focal and there was no evidence of speech difficulty including palilalia. There was no clear temporal tenderness to palpation, though this too was limited and no clear visual complaints. Palilalia was likely behavioral as it was absent for entirety of this examiners interview/exam. Extensive workup including CT and LP were unremarkable. Recommend fluids and NSAIDs such as Toradol for HA and would avoid narcotic medication. No further neurologic workup recommended at this time.     Discussed with attending, Dr. Doug Sou.    Genella Mech, MD  Neurology Resident PGY4  Pgr [phone]

## 2014-07-29 NOTE — ED Notes (Signed)
LP done by Dr. Allison Quarryobb  Pt tolerated ok  Pt complaing

## 2014-07-29 NOTE — ED Notes (Signed)
Pt stating that RN is watering the medication and it is not helping her  Charge RN and Dr. Allison Quarryobb notified.  Pt refusing pain medication at this time.  Pt states wants medication pushed quickly.

## 2014-07-29 NOTE — ED Provider Notes (Signed)
Emergency Medicine Resident Note    Chief Complaint   Patient presents with    Headache- New Onset Or New Symptoms     Pt BIBM from home, pt reports HA x4 days. Called 911 today for NV and light sensitivity. BS 314. Hx: Schizophrenia, Bipolar         HPI:  Alexandria Proctor is a 58 year old female with a history of gastric bypass surgery w/chronic nausea (takes phenergan daily) who complains of headache for the past 4 days. She describes the headache as bifrontal throbbing pain, and it is associated with nausea, vomiting and photophobia. Headache onset was gradual, it was not maximal at onset, and this is the worst headache of her life. Endorses pain over the temples and pain with chewing. Denies recent vertigo, seizures, coordination problems, gait problems and tremor, chills, fevers, shortness of breath, chest pain and dizziness. Precipitating factors include: patient is aware of none. Patient has tried tylenol and norco with no improvement in pain. Describes the frequency of her typical headaches as: rare. She denies a history of migraines. Prior neurological history is negative for migraine headaches, stroke, brain neoplasms, seizure disorders, major head injuries.       Home Medications:    buPROPion (WELLBUTRIN) 100 MG tablet Take 100 mg by mouth 3 times daily.   famotidine (PEPCID) 20 MG tablet Take 1 tablet by mouth 2 times daily.   HYDROcodone-acetaminophen (NORCO) 5-325 MG tablet Take 1 tablet by mouth every 8 hours as needed for Moderate Pain (Pain Score 4-6).   pantoprazole (PROTONIX) 20 MG tablet Take 20 mg by mouth daily.   promethazine (PHENERGAN) 25 MG tablet Take 1 tablet by mouth every 8 hours as needed for Nausea.       Allergies: Compazine; Reglan; Toradol; Tramadol; and Zofran    Past Medical History:  Past Medical History   Diagnosis Date    Manic depression     Bipolar 1 disorder     Schizophrenia        Past Surgical History:  No past surgical history on file.    Social History:   History     Substance Use Topics    Smoking status: Current Every Day Smoker    Smokeless tobacco: Not on file    Alcohol Use: No       Family History:  No family history on file.    Review of Systems:  12-point ROS performed and was negative except as stated in the HPI.       Objective:  BP 101/74 mmHg   Pulse 80   Temp(Src) 98.2 F (36.8 C)   Resp 18   Ht '5\' 5"'  (1.651 m)   Wt 93.895 kg (207 lb)   BMI 34.45 kg/m2   SpO2 95%    Physical Exam:   General: WD/WN female, awake and alert, pleasant and conversant, NAD  Head: normocephalic, atraumatic, diffuse ttp over forehead and temples  Eyes: sclerae anicteric, PERRLA, EOMI, no nystagmus   ENT: mucous membranes moist, no o/p erythema or exudate   Neck: supple, full ROM   Heart: rrr, normal S1 and S2, no m/r/g   Lungs: no increased WOB, CTAB, no rales, wheezing, or rhonchi   Chest: no deformity, no tenderness to palpation   Abdomen: soft, NT/ND, normoactive bs   Extremities: non-edematous, 2+ radial pulses bilaterally   Skin: warm, dry, well-perfused   Neuro: AAOx4, speech normal   CN III, IV, VI: EOMI, PERRLA, no nystagmus  CN V: facial SILT bilaterally in V1, V2, and V3 distributions   CN VII: no facial asymmetry, symmetrical smile and brow raise    CN VIII: hearing intact to finger rub bilaterally    CN IX, X: palate elevation symmetric, uvula midline, no hoarseness of voice    CN XI: shoulder shrug 5/5 bilaterally    CN XII: tongue protrusion midline   Coordination: FTN w/o tremor or past-pointing, finger tap w/reg rate/rhythm, RAM w/o dysdiadochokinesia    Gait: steady   Pronator drift: negative   Motor: 5/5 strength in BUE and BLE   Sensation: intact to light touch in BUE, BLE, and torso   Psych: mood/affect appropriate           Assessment & Plan:  Alexandria Proctor is a 58 year old female with a history of gastric bypass surgery w/chronic nausea (takes phenergan daily) who complains of headache for the past 4 days.. Patient is overall well appearing, is not febrile,  and without focal neuro deficits or meningismus. Based on the onset, chronology, and exam, suspicion for [facility] is low. Other items in the differential include migraine, tension headache, sinusitis, rebound headache. History and exam are not consistent with glaucoma or venous sinus thrombosis.     - labs: cbc, bmp, esr/crp  - imaging: CT head   - pain control: dilaudid, phenergan       Discussed with attending physician, Minns, Kyung Rudd, MD      Carles Collet, MD  Resident  07/29/14 6237    Minns, Kyung Rudd, MD  62/83/15 2028

## 2014-07-29 NOTE — ED Notes (Signed)
Unable to get IV by U/S by MD Dr. Lajuana RippleMinns and 5RNs.

## 2014-07-29 NOTE — ED Notes (Signed)
PT very drowsy and trying to get out of bed.   Instructed pt that she CANNOT get out of bed.  Double rails up  Bed lowest position.     Bed pan

## 2014-07-29 NOTE — ED Notes (Signed)
Patient reevaluated and cleared for discharge by MD. All questions answered. Written aftercare instructions reviewed and without RX prescription. Patient discharged in stable conditions. Patient provided with cab voucher.

## 2014-07-29 NOTE — ED Notes (Signed)
Another RN trying for U/S unsuccessful  Dr. Lajuana RippleMinns aware.

## 2014-07-29 NOTE — ED Notes (Signed)
C/C HA x 4days.+nasuea/vomiting  Tylenol is not working  MAEx4  Strong grip and pulls  MAEx4

## 2014-07-29 NOTE — ED Procedure Note (Signed)
Procedure Note  Lumbar Puncture  Date/Time: 07/29/2014 9:04 PM  Performed by: Barrett HenleOBB, Boots Mcglown  Authorized by: Elmon KirschnerMINNS, ALICIA BETHANY  Consent: Verbal consent obtained. Written consent obtained.  Risks and benefits: risks, benefits and alternatives were discussed  Consent given by: patient  Patient understanding: patient states understanding of the procedure being performed  Patient consent: the patient's understanding of the procedure matches consent given  Site marked: the operative site was marked  Imaging studies: imaging studies available  Required items: required blood products, implants, devices, and special equipment available  Patient identity confirmed: verbally with patient and arm band  Time out: Immediately prior to procedure a "time out" was called to verify the correct patient, procedure, equipment, support staff and site/side marked as required.  Indications: evaluation for infection and evaluation for subarachnoid hemorrhage  Anesthesia: local infiltration  Local anesthetic: lidocaine 1% without epinephrine  Anesthetic total: 10 ml  Patient sedated: no  Preparation: Patient was prepped and draped in the usual sterile fashion.  Lumbar space: L3-L4 interspace  Patient's position: sitting  Needle type: whitacre pencil tip  Needle length: 3.5 in  Number of attempts: 4  Fluid appearance: clear  Tubes of fluid: 4  Total volume: 4 ml  Post-procedure: site cleaned and adhesive bandage applied  Patient tolerance: Patient tolerated the procedure well with no immediate complications

## 2014-07-30 NOTE — ED MD Progress Note (Addendum)
Presented with headache, photophobia, nausea/vomiting. Also reports fever last night. Requesting multiple doses of dilaudid and has multiple allergies. Head ct and lp unremarkable however pt then with repetative speech, intermittent, no other focal abnl. Neuro consulted. crp elevated of unclear etiology. Esr mildly elevated. Attempted to send blood cultures however pt very difficult stick and declined further attempts. All care at [facility], encouraged for f/u asap with pmd

## 2014-07-31 NOTE — ED Procedure Note (Signed)
Procedure Note  Lumbar Puncture  Date/Time: 07/29/2014 7:18 PM  Performed by: Barrett HenleOBB, Alixis Harmon  Authorized by: Donovan KailMASZEWSKI, CHRISTIAN ALBERTO  Consent: Verbal consent obtained. Written consent obtained.  Risks and benefits: risks, benefits and alternatives were discussed  Consent given by: patient  Patient understanding: patient states understanding of the procedure being performed  Patient consent: the patient's understanding of the procedure matches consent given  Site marked: the operative site was marked  Imaging studies: imaging studies available  Required items: required blood products, implants, devices, and special equipment available  Patient identity confirmed: verbally with patient and arm band  Time out: Immediately prior to procedure a "time out" was called to verify the correct patient, procedure, equipment, support staff and site/side marked as required.  Indications: evaluation for infection and evaluation for subarachnoid hemorrhage  Anesthesia: local infiltration  Local anesthetic: lidocaine 1% with epinephrine  Anesthetic total: 8 ml  Patient sedated: no  Preparation: Patient was prepped and draped in the usual sterile fashion.  Lumbar space: L3-L4 interspace  Patient's position: sitting  Needle gauge: 22  Needle type: spinal needle - quincke tip  Needle length: 3.5 in  Number of attempts: 2  Fluid appearance: clear  Tubes of fluid: 4  Total volume: 4 ml  Post-procedure: site cleaned and adhesive bandage applied  Patient tolerance: Patient tolerated the procedure well with no immediate complications

## 2014-08-03 LAB — CSF CULTURE W GRAM STAIN, AEROBIC ONLY
CSF Culture Result: NO GROWTH
Gram Stain Result: NONE SEEN

## 2014-09-22 HISTORY — PX: TOTAL KNEE ARTHROPLASTY: SHX125

## 2015-09-25 ENCOUNTER — Emergency Department
Admission: EM | Admit: 2015-09-25 | Discharge: 2015-09-25 | Disposition: A | Discharge status: Home / Self Care | Payer: 59 | Attending: Emergency Medicine | Admitting: Emergency Medicine

## 2015-09-25 ENCOUNTER — Encounter (HOSPITAL_COMMUNITY): Payer: Self-pay | Admitting: Emergency Medicine

## 2015-09-25 DIAGNOSIS — F10129 Alcohol abuse with intoxication, unspecified: Principal | ICD-10-CM | POA: Insufficient documentation

## 2015-09-25 DIAGNOSIS — J984 Other disorders of lung: Secondary | ICD-10-CM

## 2015-09-25 DIAGNOSIS — F17200 Nicotine dependence, unspecified, uncomplicated: Secondary | ICD-10-CM | POA: Insufficient documentation

## 2015-09-25 DIAGNOSIS — R45851 Suicidal ideations: Secondary | ICD-10-CM

## 2015-09-25 DIAGNOSIS — R05 Cough: Secondary | ICD-10-CM | POA: Insufficient documentation

## 2015-09-25 DIAGNOSIS — G894 Chronic pain syndrome: Secondary | ICD-10-CM

## 2015-09-25 DIAGNOSIS — F3181 Bipolar II disorder: Secondary | ICD-10-CM | POA: Insufficient documentation

## 2015-09-25 DIAGNOSIS — F10920 Alcohol use, unspecified with intoxication, uncomplicated: Secondary | ICD-10-CM

## 2015-09-25 DIAGNOSIS — M549 Dorsalgia, unspecified: Secondary | ICD-10-CM | POA: Insufficient documentation

## 2015-09-25 DIAGNOSIS — G8929 Other chronic pain: Secondary | ICD-10-CM | POA: Insufficient documentation

## 2015-09-25 DIAGNOSIS — F209 Schizophrenia, unspecified: Secondary | ICD-10-CM | POA: Insufficient documentation

## 2015-09-25 LAB — URINE IMMUNOASSAY DRUG SCREEN
Amphetamines Screen: NEGATIVE
Barbiturates Screen: NEGATIVE
Benzodiazepine Screen: NEGATIVE
Cocaine Screen: NEGATIVE
Methadone Screen: NEGATIVE
Oxycodone Screen: NEGATIVE
Phencyclidine Screen: NEGATIVE
THC Screen: NEGATIVE

## 2015-09-25 LAB — UR DRUGS OF ABUSE SCREEN

## 2015-09-25 MED ORDER — TRAZODONE HCL 50 MG OR TABS
50.0000 mg | ORAL_TABLET | Freq: Every evening | ORAL | Status: DC
Start: 2015-09-25 — End: 2015-09-25
  Filled 2015-09-25: qty 1

## 2015-09-25 MED ORDER — METHADONE HCL 10 MG OR TABS
30.00 mg | ORAL_TABLET | Freq: Every day | ORAL | Status: DC
Start: ? — End: 2018-04-20

## 2015-09-25 MED ORDER — CARISOPRODOL 350 MG OR TABS
350.00 mg | ORAL_TABLET | Freq: Four times a day (QID) | ORAL | Status: DC
Start: ? — End: 2018-04-20

## 2015-09-25 MED ORDER — ALBUTEROL SULFATE 108 (90 BASE) MCG/ACT IN AERS
2.0000 | INHALATION_SPRAY | Freq: Once | RESPIRATORY_TRACT | Status: AC
Start: 2015-09-25 — End: 2015-09-25
  Administered 2015-09-25: 2 via RESPIRATORY_TRACT
  Filled 2015-09-25: qty 6.7

## 2015-09-25 MED ORDER — OXYCODONE-ACETAMINOPHEN 5-325 MG OR TABS
1.0000 | ORAL_TABLET | Freq: Once | ORAL | Status: AC
Start: 2015-09-25 — End: 2015-09-25
  Administered 2015-09-25: 1 via ORAL
  Filled 2015-09-25: qty 1

## 2015-09-25 MED ORDER — OXYCODONE-ACETAMINOPHEN 10-325 MG OR TABS
1.0000 | ORAL_TABLET | Freq: Four times a day (QID) | ORAL | 0 refills | Status: DC | PRN
Start: 2015-09-25 — End: 2018-01-05

## 2015-09-25 MED ORDER — METHOCARBAMOL 750 MG OR TABS
750.0000 mg | ORAL_TABLET | Freq: Three times a day (TID) | ORAL | 0 refills | Status: DC
Start: 2015-09-25 — End: 2018-04-20

## 2015-09-25 MED ORDER — PREGABALIN 200 MG OR CAPS
200.00 mg | ORAL_CAPSULE | Freq: Every day | ORAL | Status: DC
Start: ? — End: 2018-01-05

## 2015-09-25 MED FILL — OXYCODONE/APAP TAB 10-325 MG: MG | 3 days supply | Qty: 15 | Fill #0

## 2015-09-25 NOTE — ED Notes (Signed)
Pt DC per MD, 5150 Discontinued. DC with family.Pt calm & cooperative VSS at this time.

## 2015-09-25 NOTE — ED Notes (Signed)
Patient belongings returned to patient from locked closet by Sasha P., RN.
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
## 2015-09-25 NOTE — ED Notes (Signed)
Psychiatry at bedside.
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
## 2015-09-25 NOTE — ED Notes (Signed)
5150 visualized in 5150 binder at front RN station.

## 2015-09-25 NOTE — Discharge Instructions (Signed)
Suicidal Ideation    You have been seen today for thoughts of hurting yourself.    It is important to follow up with your counselor and family doctor. If you do not have an appointment in the next 2-3 days, call and make one. It is very important to tell your counselor and family doctor about your worsening problems.    It is important to have a counselor and a family doctor who see you regularly. They can help you with your problem and watch your progress.    By signing below, you agree to a safety contract. This means you promise not to hurt yourself or anyone else. If you feel you cannot keep this promise, return here or go to the nearest Emergency Department right away.    Stay with someone you trust tonight.    You may not have a specific suicide plan, but it is still a good idea to take all weapons out of your home.    AVOID THE USE OF ALCOHOL AND OTHER DRUGS: Using these substances may intensify or increase your suicide thoughts!    YOU SHOULD SEEK MEDICAL ATTENTION IMMEDIATELY, EITHER HERE OR AT THE NEAREST EMERGENCY DEPARTMENT, IF ANY OF THE FOLLOWING OCCURS:   Worsening or new thoughts of harming yourself (suicidal thoughts).   Thoughts of harming others.   You do not feel safe in your home environment.   You think about hurting yourself or others. The National Suicide Hotline number is 1-800-SUICIDE (1-800-784-2433), or 1-800-273-TALK (8255).

## 2015-09-25 NOTE — ED Notes (Signed)
Pt requesting refills on psych and "anxieties" medications as well as pain meds (percocet).   Dr.Thackaberry at bedside speaking to pt concerning her request for med refills.  Dr.Ly aware about pain med refill and stated he will speak with pt concerning this request as well.

## 2015-09-25 NOTE — ED Notes (Signed)
Dr.Ly aware of pt's request for pain meds.  Pt reported her meal was cold and requested Malawiturkey sandwich, Malawiturkey sandwich provided.

## 2015-09-25 NOTE — ED Notes (Signed)
Pt awake, alert, aware, MAE, VSS. Pt c/o cough and chronic lower back and right shoulder pain, for which pt is requesting percocet, will ask MD for pain control. Pt provided hot breakfast tray from cafeteria at this time.

## 2015-09-25 NOTE — ED Notes (Signed)
Dr. Ly at bedside

## 2015-09-25 NOTE — ED Notes (Signed)
Patient went to restroom via walker under supervision of Nita SickleBrian T., [employer]

## 2015-09-25 NOTE — ED Notes (Signed)
Patient ambulated from ED using walker with friend - going to pharmacy.

## 2015-09-25 NOTE — ED Notes (Signed)
No longer sitter for pt, t/o to tech Deborah

## 2015-09-25 NOTE — ED Notes (Addendum)
Assumed care of 60 Y/o F pt BIB PD for 5150 for SI plan to fall down on scissor. HX Bipolar, Manic, Schizo. Pt resting in bed rr even non labored. Pt visitor at bedside approved per report. Pt awaiting for DC home. Sitter remain at bedside.

## 2015-09-25 NOTE — ED Notes (Signed)
Hot breakfast provided to patient as ordered by Florina Ouarly P., RN.

## 2015-09-25 NOTE — ED Notes (Signed)
Patient changing into street clothes at bedside.

## 2015-09-25 NOTE — ED Notes (Signed)
Difficulty with reaching patient's daughter on portable phone even though daughter was waiting for call as per Dr. Theodoro Doinghackaberry.  Patient suddenly started screaming that she doesn't like this Clinical research associatewriter and attracted Security Officer's attention.

## 2015-09-25 NOTE — ED Notes (Signed)
This writer is no longer sitter for patient.

## 2015-09-25 NOTE — ED Notes (Signed)
Visitor at bedside as approved by patient Alexandria Proctor(Alexandria C.).

## 2015-09-25 NOTE — ED Notes (Signed)
Pt will follow up with PMD. NAD at this time. Pt VSS at this moment. DC home with Family

## 2015-09-25 NOTE — ED MD Progress Note (Signed)
ED Course:  4:11 AM - spoke with Psychiatry.  Recommended Trazodone.  6:04 AM - pt signed out to Dr. Rubye BeachLy.  Dispo per Psych    Lab Results:  Results for orders placed or performed during the hospital encounter of 09/25/15   URINE IMMUNOASSAY DRUG SCREEN   Result Value Ref Range    Amphetamines Screen Negative Negative    Barbiturates Screen Negative Negative    Cocaine Screen Negative Negative    Benzodiazepine Screen Negative Negative    Methadone Screen Negative Negative    Opiates Screen Pending confirm Negative    Oxycodone Screen Negative Negative    Phencyclidine Screen Negative Negative    THC Screen Negative Negative    UR Drug Screen Interpretation See Comment Negative       Imaging Results:  No results found.      All labs and study results were reviewed by myself and the Attending. Pertinent findings were discussed with the patient and her questions were answered. She agrees with this plan of care.

## 2015-09-25 NOTE — ED Notes (Signed)
Pt quit smoking 893-months ago, now chronic cough.

## 2015-09-25 NOTE — ED Notes (Signed)
Dr. Thackaberry, psychiatrist, at bedside.

## 2015-09-25 NOTE — ED Notes (Signed)
Breaking primary sitter.

## 2015-09-25 NOTE — ED Notes (Signed)
Patient apologized to this Clinical research associatewriter for screaming and for her behavior.

## 2015-09-25 NOTE — ED Notes (Signed)
Attempted to give inhaler that was ordered. Pt difficult to arouse & snoring. Will attempt later.
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
## 2015-09-25 NOTE — ED Notes (Signed)
@  0330: this Clinical research associatewriter as Comptrollersitter for pt. Pt taken in wheelchair to restroom and changed into psych appropriate hospital gown and pants. All personal belongings removed from pt and to psych locker
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
## 2015-09-25 NOTE — ED Notes (Signed)
This writer is replacing Heidi B., Tech as sitter after bedside report.
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
## 2015-09-25 NOTE — ED MD Progress Note (Addendum)
[id number]  Suicidal and intoxicated. Awaiting metabolism. CXR pending for cough.     1130  Discussed with psych. Patient can be DC.    1200  Patient requesting refill for robaxin 750 tid and percocet for her chronic back pain. Will provide bridge until [facility]

## 2015-09-27 LAB — CONFIRM OPIATES, URINE
Conf. 6 Acetylmorphine: NEGATIVE ng/mL
Conf. Codeine: NEGATIVE ng/mL
Conf. EDDP: NEGATIVE ng/mL
Conf. Fentanyl: NEGATIVE ng/mL
Conf. Hydrocodone: NEGATIVE ng/mL
Conf. Hydromorphone: NEGATIVE ng/mL
Conf. Methadone: NEGATIVE ng/mL
Conf. Morphine: POSITIVE ng/mL — AB
Conf. Norfentanyl: NEGATIVE ng/mL
Conf. Oxycodone: NEGATIVE ng/mL
Conf. Oxymorphone: NEGATIVE ng/mL

## 2015-09-27 NOTE — ED Follow-up Note (Signed)
Follow-up type: Callback       Routine ED Patient Call Back    Patient unable to be contacted, no message left

## 2016-02-06 ENCOUNTER — Emergency Department
Admission: EM | Admit: 2016-02-06 | Discharge: 2016-02-06 | Discharge status: Against Medical Advice | Payer: Self-pay | Attending: Emergency Medicine | Admitting: Emergency Medicine

## 2016-02-06 ENCOUNTER — Emergency Department
Admission: EM | Admit: 2016-02-06 | Discharge: 2016-02-06 | Disposition: A | Discharge status: Other Acute Inpatient Hospital | Payer: 59 | Attending: Emergency Medicine | Admitting: Emergency Medicine

## 2016-02-06 DIAGNOSIS — M47814 Spondylosis without myelopathy or radiculopathy, thoracic region: Secondary | ICD-10-CM

## 2016-02-06 DIAGNOSIS — Z5321 Procedure and treatment not carried out due to patient leaving prior to being seen by health care provider: Secondary | ICD-10-CM | POA: Insufficient documentation

## 2016-02-06 DIAGNOSIS — S43109A Unspecified dislocation of unspecified acromioclavicular joint, initial encounter: Secondary | ICD-10-CM

## 2016-02-06 DIAGNOSIS — F209 Schizophrenia, unspecified: Secondary | ICD-10-CM | POA: Insufficient documentation

## 2016-02-06 DIAGNOSIS — R079 Chest pain, unspecified: Secondary | ICD-10-CM | POA: Insufficient documentation

## 2016-02-06 DIAGNOSIS — F99 Mental disorder, not otherwise specified: Secondary | ICD-10-CM

## 2016-02-06 DIAGNOSIS — Z029 Encounter for administrative examinations, unspecified: Principal | ICD-10-CM | POA: Insufficient documentation

## 2016-02-06 DIAGNOSIS — F29 Unspecified psychosis not due to a substance or known physiological condition: Secondary | ICD-10-CM | POA: Insufficient documentation

## 2016-02-06 DIAGNOSIS — F17200 Nicotine dependence, unspecified, uncomplicated: Secondary | ICD-10-CM | POA: Insufficient documentation

## 2016-02-06 DIAGNOSIS — F319 Bipolar disorder, unspecified: Secondary | ICD-10-CM | POA: Insufficient documentation

## 2016-02-06 LAB — BASIC METABOLIC PANEL, BLOOD
Anion Gap: 12 mmol/L (ref 7–15)
BUN: 16 mg/dL (ref 8–23)
Bicarbonate: 25 mmol/L (ref 22–29)
Calcium: 9.1 mg/dL (ref 8.5–10.6)
Chloride: 104 mmol/L (ref 98–107)
Creatinine: 0.79 mg/dL (ref 0.51–0.95)
GFR: >60 mL/min
Glucose: 83 mg/dL (ref 70–99)
Potassium: 3.6 mmol/L (ref 3.5–5.1)
Sodium: 141 mmol/L (ref 136–145)

## 2016-02-06 LAB — CBC WITH DIFF, BLOOD
ANC-Automated: 5.3 10*3/uL (ref 1.6–7.0)
Abs Eosinophils: 0.2 10*3/uL (ref 0.1–0.5)
Abs Lymphs: 1.1 10*3/uL (ref 0.8–3.1)
Abs Monos: 0.6 10*3/uL (ref 0.2–0.8)
Eosinophils: 3 %
Hct: 34.6 % (ref 34.0–45.0)
Hgb: 11.7 gm/dL (ref 11.2–15.7)
Lymphocytes: 15 %
MCH: 26.8 pg (ref 26.0–32.0)
MCHC: 33.8 g/dL (ref 32.0–36.0)
MCV: 79.4 um3 (ref 79.0–95.0)
MPV: 9.3 fL — ABNORMAL LOW (ref 9.4–12.4)
Monocytes: 8 %
Plt Count: 211 10*3/uL (ref 140–370)
RBC: 4.36 10*6/uL (ref 3.90–5.20)
RDW: 16 % — ABNORMAL HIGH (ref 12.0–14.0)
Segs: 73 %
WBC: 7.3 10*3/uL (ref 4.0–10.0)

## 2016-02-06 LAB — PHOSPHORUS, BLOOD: Phosphorous: 3 mg/dL (ref 2.7–4.5)

## 2016-02-06 LAB — ECG 12-LEAD
ATRIAL RATE: 66 {beats}/min
ECG INTERPRETATION: NORMAL
P AXIS: 63 degrees
PR INTERVAL: 170 ms
QRS INTERVAL/DURATION: 94 ms
QT: 446 ms
QTC INTERVAL: 467 ms
R AXIS: 10 degrees
T AXIS: 53 degrees
VENTRICULAR RATE: 66 {beats}/min

## 2016-02-06 LAB — D-DIMER HIGHLY SENSITIVE, BLOOD: D-Dimer HS: <150 ng/mL D-DU (ref <241)

## 2016-02-06 LAB — MAGNESIUM, BLOOD: Magnesium: 2.1 mg/dL (ref 1.6–2.4)

## 2016-02-06 LAB — CPK-CREATINE PHOSPHOKINASE, BLOOD: CPK: 622 U/L — ABNORMAL HIGH (ref 0–175)

## 2016-02-06 LAB — TROPONIN T, BLOOD: Troponin T: <0.01 ng/mL (ref <0.01)

## 2016-02-06 NOTE — ED MD Progress Note (Signed)
Sign out note:  Transfer of care from Dr. Adelfa KohGrieme at 6:15 AM    Impression:  60 year old female p/w chest pain  Workup negative  Medically clear  [facility]  [facility]      Vitals:    02/06/16 0427   BP: 131/96   Pulse: 63   Resp: 16   Temp: 97.4 F (36.3 C)   SpO2: 97%   Weight: 63.5 kg (140 lb)       Lab results:  Results for orders placed or performed during the hospital encounter of 02/06/16   BASIC METABOLIC PANEL, BLOOD   Result Value Ref Range    Glucose 83 70 - 99 mg/dL    BUN 16 8 - 23 mg/dL    Creatinine 1.610.79 0.960.51 - 0.95 mg/dL    GFR >04>60 mL/min    Sodium 141 136 - 145 mmol/L    Potassium 3.6 3.5 - 5.1 mmol/L    Chloride 104 98 - 107 mmol/L    Bicarbonate 25 22 - 29 mmol/L    Anion Gap 12 7 - 15 mmol/L    Calcium 9.1 8.5 - 10.6 mg/dL   PHOSPHORUS, BLOOD   Result Value Ref Range    Phosphorous 3.0 2.7 - 4.5 mg/dL   MAGNESIUM, BLOOD   Result Value Ref Range    Magnesium 2.1 1.6 - 2.4 mg/dL   CPK-CREATINE PHOSPHOKINASE, BLOOD   Result Value Ref Range    CPK 622 (H) 0 - 175 U/L   TROPONIN T, BLOOD   Result Value Ref Range    Troponin T <0.01 <0.01 ng/mL   CBC WITH ADIFF, BLOOD   Result Value Ref Range    WBC 7.3 4.0 - 10.0 1000/mm3    RBC 4.36 3.90 - 5.20 mill/mm3    Hgb 11.7 11.2 - 15.7 gm/dL    Hct 54.034.6 98.134.0 - 19.145.0 %    MCV 79.4 79.0 - 95.0 um3    MCH 26.8 26.0 - 32.0 pgm    MCHC 33.8 32.0 - 36.0 g/dL    RDW 47.816.0 (H) 29.512.0 - 14.0 %    MPV 9.3 (L) 9.4 - 12.4 fL    Plt Count 211 140 - 370 1000/mm3    Segs 73 %    Lymphocytes 15 %    Monocytes 8 %    Eosinophils 3 %    ANC-Automated 5.3 1.6 - 7.0 1000/mm3    Abs Lymphs 1.1 0.8 - 3.1 1000/mm3    Abs Monos 0.6 0.2 - 0.8 1000/mm3    Abs Eosinophils 0.2 <0.1 - 0.5 1000/mm3    Diff Type Automated    D-DIMER HIGHLY SENSITIVE, BLOOD   Result Value Ref Range    D-Dimer HS <150 <241 ng/mL D-DU       Imaging:  No results found.    Dispo pending:  [facility]

## 2016-02-06 NOTE — ED EKG Interpretation (Signed)
ED EKG Interpretation    EKG: Normal Sinus Rhythm, No old EKG available and No ST elevations or depressions with Normal Axis and nonspecific ST and T wave changes, no ischemic changes and HR66 QTc467.

## 2016-02-06 NOTE — ED Notes (Signed)
Dr.grieme at bedside evaluating pt

## 2016-02-06 NOTE — ED Notes (Signed)
[facility] BLS transport at bedside. Pt awake and alert, verbalizes understanding [facility]. VSS. NAD. Denies pain or discomfort. On cardiac monitor, NSR.

## 2016-02-06 NOTE — ED Notes (Signed)
Turnover report given to [facility] sitter.

## 2016-02-06 NOTE — ED Notes (Signed)
Pt sent from [facility] co chest pain, non radiating, non specific describing cp, pt placed on 5150 by [facility] due to multiple calls by pt stating pt are after her, pt dg. Pt rambling speech. Placed on monitors, cardiac rhythm nsr.

## 2016-02-06 NOTE — ED Notes (Signed)
Attempted iv placement x 2, notified dr.grieme states ok just to draw blood labs s having iv access but if cardiac enzymes + iv will be needed

## 2016-02-06 NOTE — ED MD Progress Note (Signed)
Sign-out note:  Assuming care of this 60 [facility] for acute psychosis  CP workup unremarkable  [facility]

## 2016-02-06 NOTE — ED Notes (Addendum)
[facility] [place]. [facility].    Auth# [id number]

## 2016-02-06 NOTE — ED Notes (Signed)
Bed: 04B  Expected date:   Expected time:   Means of arrival:   Comments:  [facility]

## 2016-02-06 NOTE — ED EKG Interpretation (Signed)
ED EKG Interpretation    EKG: No old EKG available with Normal Axis and Q waves in anterior leads. no ste, std, twi. rate 66, normal intervals. and nonischemic EKG.

## 2016-02-06 NOTE — Discharge Instructions (Signed)
Chest Pain of Unclear Etiology     You have been seen for chest pain. The cause of your pain is not yet known.     Your doctor has learned about your medical history, examined you, and checked any tests that were done. Still, it is unclear why you are having pain. The doctor thinks there is only a very small chance that your pain is caused by a life-threatening condition. Later, your primary care doctor might do more tests or check you again.     Sometimes chest pain is caused by a dangerous condition, like a heart attack, aorta injury, blood clot in the lung, or collapsed lung. It is unlikely that your pain is caused by a life-threatening condition if: Your chest pain lasts only a few seconds at a time; you are not short of breath, nauseated (sick to your stomach), sweaty, or lightheaded; your pain gets worse when you twist or bend; your pain improves with exercise or hard work.     Chest pain is serious. It is VERY IMPORTANT that you follow up with your regular doctor and seek medical attention immediately here or at the nearest Emergency Department if your symptoms become worse or they change.     YOU SHOULD SEEK MEDICAL ATTENTION IMMEDIATELY, EITHER HERE OR AT THE NEAREST EMERGENCY DEPARTMENT, IF ANY OF THE FOLLOWING OCCURS:  · Your pain gets worse.  · Your pain makes you short of breath, nauseated, or sweaty.  · Your pain gets worse when you walk, go up stairs, or exert yourself.  · You feel weak, lightheaded, or faint.  · It hurts to breathe.  · Your leg swells.  · Your symptoms get worse or you have new symptoms or concerns.

## 2016-02-06 NOTE — ED MD Progress Note (Signed)
[facility] with [facility]  Auth# is [id number]  [facility].

## 2016-02-06 NOTE — ED Notes (Signed)
12 Lead ECG complete and handed to Dr. Lenoard AdenGreime. 2nd copy put into chart.

## 2016-02-06 NOTE — ED Notes (Signed)
Bed: 04B  Expected date:   Expected time:   Means of arrival:   Comments:  [facility]

## 2016-02-06 NOTE — ED Provider Notes (Signed)
Emergency Department Note  [place] electronic medical record reviewed for pertinent medical history.     Nursing Triage Note:   Chief Complaint   Patient presents with   . Chest Pain - Adult     from [facility], pt co chest pain, non radiating, rambling speech,        HPI:   60 year old female with a PMH significant for bipolar disorder, presenting with chest pain.  Patient presents on a psychiatric hold from [facility].  Patient was placed on hold by the police department for grave disability.  Unclear how patient presented to the psychiatric facility however once arriving there, she complained of chest pain.  Patient is disorganized, tangential and circumstantial, as such she is a very difficult historian.  She is able to say that the chest pain started within the last several hours and it hurts when she takes a deep breath. She denies any history of cardiac disease.  She does smoke.  She denies any other infectious complaints.    HPI    Past Medical History:   Diagnosis Date   . Bipolar 1 disorder (CMS-HCC)    . Manic depression (CMS-HCC)    . Schizophrenia (CMS-HCC)        Past Surgical History:   Procedure Laterality Date   . STOMACH SURGERY         Family History:  uto    Social History:      Social History   Substance Use Topics   . Smoking status: Current Every Day Smoker   . Smokeless tobacco: Not on file   . Alcohol use No       Medications:   Prior to Admission Medications   Prescriptions Last Dose Informant Patient Reported? Taking?   buPROPion (WELLBUTRIN) 100 MG tablet   Yes No   Sig: Take 100 mg by mouth 3 times daily.   carisoprodol (SOMA) 350 MG tablet   Yes No   Sig: Take 350 mg by mouth every 6 hours.   methadone (DOLOPHINE) 10 MG tablet   Yes No   Sig: Take 30 mg by mouth daily.   methocarbamol (ROBAXIN) 750 MG tablet   No No   Sig: Take 1 tablet (750 mg) by mouth 3 times daily.   oxycodone-acetaminophen (PERCOCET) 10-325 MG tablet   No No   Sig: Take 1 tablet by mouth every 6 hours as needed for  Severe Pain (Pain Score 7-10).   pantoprazole (PROTONIX) 20 MG tablet   Yes No   Sig: Take 20 mg by mouth daily.   pregabalin (LYRICA) 200 MG capsule   Yes No   Sig: Take 200 mg by mouth daily.   promethazine (PHENERGAN) 25 MG tablet   No No   Sig: Take 1 tablet by mouth every 8 hours as needed for Nausea.      Facility-Administered Medications: None       Allergies: Compazine; Reglan [metoclopramide]; Toradol; Tramadol; and Zofran [ondansetron]    Review of Systems:     Review of Systems   Unable to perform ROS: Psychiatric disorder     All other systems reviewed and negative unless otherwise noted in the HPI or above. This was done per my custom and practice for systems appropriate to the chief complaint in an emergency department setting and varies depending on the quality of history that the patient is able to provide.      Physical Exam:   02/06/16  0427   BP:  131/96   Pulse: 63   Resp: 16   Temp: 97.4 F (36.3 C)   SpO2: 97%     Nursing note and vitals reviewed.     Physical Exam   gen nad  heent ncat  Neck no jvd  cv rrr  pulm ctab  Chest wall, slightly sternum  Abd, well healed surgical incision  Le no edema      Impression & Initial ED Plan:  60 year old  [facility]. Patient does have a history of schizophrenia and bipolar disorder and she is manic with rambling speech as such history is difficult to obtain.  EKG performed on arrival.  No evidence of ischemia.  She does have Q-waves in the anterior leads.  Will obtain cardiac workup including troponin, D-dimer, chest x-ray.  Continue 5150 at this time.  If the cardiac workup is unrevealing, patient [facility] for further workup of her acute psychiatric illness.      The rest of the ED course, results, and plan for the patient is in a separate continuation note. Please see that note for details.       I have discussed my evaluation and care plan for the patient  with the attending physician Dr. Ammie DaltonNakajima.     Laney PastorGrieme, Mykal Batiz Arthur, MD  Resident  02/06/16 0445       Wells GuilesNakajima, Yuko, MD  02/06/16 [phone]

## 2016-02-17 ENCOUNTER — Emergency Department
Admission: EM | Admit: 2016-02-17 | Discharge: 2016-02-18 | Disposition: A | Discharge status: Home / Self Care | Payer: 59 | Attending: Emergency Medicine | Admitting: Emergency Medicine

## 2016-02-17 DIAGNOSIS — F209 Schizophrenia, unspecified: Secondary | ICD-10-CM | POA: Insufficient documentation

## 2016-02-17 DIAGNOSIS — R45851 Suicidal ideations: Secondary | ICD-10-CM

## 2016-02-17 DIAGNOSIS — F39 Unspecified mood [affective] disorder: Secondary | ICD-10-CM | POA: Insufficient documentation

## 2016-02-17 DIAGNOSIS — R079 Chest pain, unspecified: Secondary | ICD-10-CM | POA: Insufficient documentation

## 2016-02-17 DIAGNOSIS — F319 Bipolar disorder, unspecified: Secondary | ICD-10-CM | POA: Insufficient documentation

## 2016-02-17 DIAGNOSIS — F17210 Nicotine dependence, cigarettes, uncomplicated: Secondary | ICD-10-CM | POA: Insufficient documentation

## 2016-02-17 MED ORDER — OLANZAPINE 10 MG IM SOLR
5.0000 mg | Freq: Once | INTRAMUSCULAR | Status: DC
Start: 2016-02-17 — End: 2016-02-17
  Filled 2016-02-17: qty 10

## 2016-02-17 MED ORDER — LIDOCAINE VISCOUS 2 % MT SOLN
10.00 mL | Freq: Once | OROMUCOSAL | Status: AC
Start: 2016-02-17 — End: 2016-02-17
  Administered 2016-02-17: 10 mL via ORAL
  Filled 2016-02-17: qty 15

## 2016-02-17 MED ORDER — OLANZAPINE ODT 5 MG OR TBDP
5.00 mg | ORAL_TABLET | Freq: Once | ORAL | Status: AC
Start: 2016-02-17 — End: 2016-02-17
  Administered 2016-02-17: 5 mg via ORAL
  Filled 2016-02-17: qty 1

## 2016-02-17 MED ORDER — ALUM & MAG HYDROXIDE-SIMETH 200-200-20 MG/5ML OR SUSP
30.00 mL | Freq: Once | ORAL | Status: AC
Start: 2016-02-17 — End: 2016-02-17
  Administered 2016-02-17: 30 mL via ORAL
  Filled 2016-02-17: qty 30

## 2016-02-17 NOTE — ED Notes (Signed)
Pt refusing IV access and blood work. Educated patient on importance but continues to refuse. Dr. Aleene DavidsonAlkers informed.

## 2016-02-17 NOTE — ED Notes (Signed)
Pt no longer willing to leave non-rebreather on.

## 2016-02-17 NOTE — ED Notes (Signed)
1901  Pt. Back from x-ray, is asking to speak to nurse, pt. Now claims she is SI, with AH, VH reports she is hearing voices that are telling her to kill herself and seeing people that are not there, charge nurse aware pt. Will be placed into sitter room, changed into gown and checkered wrist band applied.

## 2016-02-17 NOTE — ED Notes (Signed)
02/17/2016 6:19 PM Valere DrossJesse Allyssa Proctor    An EKG was handed to Dr. Cyndie ChimeNguyen

## 2016-02-17 NOTE — ED Notes (Signed)
S/o JS: 1F rt sided chest pain, disorganized, became more agitated, disorganized.  Got cxr and ekg.  Now refused further medical work up.  Then states SI during ED stay.  Psych consulted.    dispo per psych.       Christell Constantyama, Mariaeduarda Defranco Christine, MD  02/17/16 2102

## 2016-02-17 NOTE — ED Notes (Signed)
Pt refusing to answer orientation questions. States "I just want a sandwich, I've been here since 6!" Explained to patient importance but continues to refuse. Pt paranoid, continues to express +SI and AH, "they're whispering to me." Pt states she is suicidal because "she is sick of the police and my boyfriend putting drugs in my apartment!" Denies HI/VH. Remains on non-rebreather.    Patient is on Level II. Pt resting in bed, breathing regular, easy, unlabored. Patient does not appear in any acute distress. Suicide/homicide precautions in place for patient. Patient has 1:3 sitter. Checkered Marshall wrist band on patient. Patient in appropriate snap gown, elastic pants. Fall precautions in place for patient. Denies any needs at this time.

## 2016-02-17 NOTE — ED Notes (Signed)
Resource RN: Pt medicated per MAR.  Pt refusing blood draw or IV, will only allow someone to try with US guidance.  Leta JunglingJake, RN to make attempt at placing PIV.

## 2016-02-17 NOTE — ED MD Progress Note (Signed)
Patient refusing labs.
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
## 2016-02-17 NOTE — ED Notes (Signed)
1852  Pt. Off the floor to x-ray

## 2016-02-17 NOTE — ED Notes (Signed)
Psychiatry at bedside.

## 2016-02-17 NOTE — ED Notes (Addendum)
Pt refusing IM Zyprexa, stating "I don't want any medications until I talk to the psychiatrist. Explained to patient need for medication due to patient's paranoia, disorganized thoughts and getting up and out of bed, wanting to leave. Pt agrees to remain calm and cooperative, remain in bed until talking with psychiatrist.

## 2016-02-17 NOTE — ED Provider Notes (Signed)
Emergency Department Note   electronic medical record reviewed for pertinent medical history.     Nursing Triage Note:   Chief Complaint   Patient presents with   . Chest Pain - Adult     pt. c/o cp since last night unprovoked non radiaiting medics found her in her house cleaning with puddles of bleach all over her house not wearing gloves, hands appear swollen, 12 lead negative.        HPI   60 year old female with a PMH significant for below here for chest pain. Patient states that she has had right chest pain since this morning.  Patient states pain is nonexertional it is constant sharp nonradiating.  Patient states that she also has some shortness of breath. No sweating.  Patient states no trauma to the chest.  Patient states no cough no fever.  Patient has presented with chest pain multiple times in the past lost 5/17.  D-dimer negative trop negative at that time.  Patient has no cardiac history no hypertension no diabetes no history of MI.    Past Medical History:   Diagnosis Date   . Bipolar 1 disorder (CMS-HCC)    . Manic depression (CMS-HCC)    . Schizophrenia (CMS-HCC)        Past Surgical History:   Procedure Laterality Date   . STOMACH SURGERY         Family History:  No family history on file.    Social History:  Social History   Substance Use Topics   . Smoking status: Current Every Day Smoker   . Smokeless tobacco: Not on file   . Alcohol use No       Medications:   Prior to Admission Medications   Prescriptions Last Dose Informant Patient Reported? Taking?   buPROPion (WELLBUTRIN) 100 MG tablet   Yes No   Sig: Take 100 mg by mouth 3 times daily.   carisoprodol (SOMA) 350 MG tablet   Yes No   Sig: Take 350 mg by mouth every 6 hours.   methadone (DOLOPHINE) 10 MG tablet   Yes No   Sig: Take 30 mg by mouth daily.   methocarbamol (ROBAXIN) 750 MG tablet   No No   Sig: Take 1 tablet (750 mg) by mouth 3 times daily.   oxycodone-acetaminophen (PERCOCET) 10-325 MG tablet   No No   Sig: Take 1 tablet by  mouth every 6 hours as needed for Severe Pain (Pain Score 7-10).   pantoprazole (PROTONIX) 20 MG tablet   Yes No   Sig: Take 20 mg by mouth daily.   pregabalin (LYRICA) 200 MG capsule   Yes No   Sig: Take 200 mg by mouth daily.   promethazine (PHENERGAN) 25 MG tablet   No No   Sig: Take 1 tablet by mouth every 8 hours as needed for Nausea.      Facility-Administered Medications: None       Allergies: Compazine; Reglan [metoclopramide]; Toradol; Tramadol; and Zofran [ondansetron]      Review of Systems  Constitutional: no recent significat weight loss, night sweats, no fevers  Eyes: no double vision, no eye drainage, no blind spots  Ears: no tinnitus, no ear drainage, no ear pain  Nose: no nose bleeding, no drainage, no nose pain  Neck: no neck lumps, no neck pain, no pain on swallowing  Chest: no chest wall tenderness, no recent chest trauma, no chest skin abnormalities  Cardiac: no palpitations, no chest pain  Respiratory: No SOB, No cough, no pain on inspiration  Abdominal: no nausea, vomiting, or diarrhea.   GU: no pain on urination, no bloody urine, no urinary frequency.   Skin: no rashes, wounds, skin itching  Neurologic: no spinning sensation, no areas of facial numbness, no unilateral weakness.         Physical Exam:   02/17/16  1811   BP: 126/77   Pulse: 54   Resp: 18   Temp: 98.5 F (36.9 C)   SpO2: 94%     Nursing note and vitals reviewed.     Physical Exam  Constitutional: The patient is oriented to person, place, and time and appears well-developed and well-nourished. No distress.   Neck: Trachea midline, not stiff   Eyes: Normal extra-occular movements. Pupils are equal, round, and reactive to light. No conjunctival injection.   Cardiovascular: Regular rhythm, normal heart sounds. Exam reveals no gallop and no friction rub. No murmur heard.  Pulmonary/Chest: Effort normal. No respiratory distress. No wheezes, rhonchi, or rales . No chest wall tenderness.   Abdominal: Soft. No distension and no masses.  There is no tenderness.   Neuro: No evidence of facial droop, normal speech, mentation appropriate, steady gait.   Extremities: Warm, soft compartments  Skin: No rashes, lesions, or wounds.       Impression & Initial ED Plan:  60 year old  female presents with chest pain. Patient states pain started this morning.  She did does not know what time.  Patient has no shortness of breath no cough.  Patient is a smoker.  Possible atypical pneumonia less likely consolidation.  Unlikely pneumo.  Patient saturating well on room air.  Patient was 99% on room air while I was in the room contrary to triage vitals.  The patient has a history of psychiatric illness in a somewhat tangential.  [facility]  Explain the cannot transfer her to another emergency department.  Patient agreed to stay for labs and chest x-ray.  Will send cardiac marker is.  Initial EKG nonacute.    I have discussed my evaluation and care plan for the patient with the attending physician Dr. Ronita HippsSerra.     Adonis BrookAlker, Takyra Cantrall Marie, MD  Resident  02/17/16 Lynelle Smoke1853       Trinna BalloonSerra, John Peter, MD  02/17/16 [phone]

## 2016-02-17 NOTE — ED EKG Interpretation (Signed)
ED EKG Interpretation    EKG: Sinus Bradycardia with Normal Axis and nonspecific ST and T wave changes.  Non-diagnostic for STEMI.

## 2016-02-17 NOTE — ED Notes (Signed)
PT refusing to take neckless off, PT informed is a psych protoclot to remove all belongings, sitter aware.

## 2016-02-17 NOTE — ED Notes (Signed)
Pt provided meal

## 2016-02-17 NOTE — ED Notes (Signed)
[id number]  Assumed care of female came to ed c/o unprovoked non radiating cp since yesterday, per medics 12 lead negative, pt. Found with puddles of bleach. Pt. Admits she was cleaning and not wearing gloves, hands are swollen. nsr on full monitors. Pt. Told we will need to draw blood, pt. Stated "you will need and ultra sound machine" pt. Refusing ej or arterial stick for blood.

## 2016-02-17 NOTE — ED Notes (Signed)
Bed: 04B  Expected date:   Expected time:   Means of arrival:   Comments:  [age] F CP

## 2016-02-17 NOTE — ED Notes (Addendum)
Pt refusing IV access or blood work unless attempted with ultrasound. Attempting to find RN to do ultrasound IV and blood work. Remains on non-rebreather.

## 2016-02-17 NOTE — ED MD Progress Note (Signed)
Here with CP.   CXR neg. EKG fine. Refusing labs. Has capacity. CP resolved.  Then endorsed SI. Psych consulted. Recs pending.

## 2016-02-17 NOTE — ED Notes (Signed)
Pt refused breathalyzer.

## 2016-02-17 NOTE — ED Notes (Signed)
Pt remains calm and cooperative at this time. Watching TV in bed. Awaiting psych.

## 2016-02-18 MED ORDER — METHOCARBAMOL 750 MG OR TABS
750.00 mg | ORAL_TABLET | Freq: Once | ORAL | Status: AC
Start: 2016-02-18 — End: 2016-02-18
  Administered 2016-02-18: 750 mg via ORAL
  Filled 2016-02-18: qty 1

## 2016-02-18 NOTE — ED Notes (Signed)
Turnover given to sitter Keith.

## 2016-02-18 NOTE — ED MD Progress Note (Signed)
SO Post    [age] F with chest pain, atypical  BP 133/76  Pulse 52  Temp 98.5 F (36.9 C)  Resp 18  Ht 5\' 6"  (1.676 m)  Wt 79.4 kg (175 lb)  SpO2 98%  BMI 28.25 kg/m2  Endorsed SI later  Refused laboratory work up  ECG was ok  CXR negative    Pending psych, level 2

## 2016-02-18 NOTE — Discharge Instructions (Signed)
Chest Pain of Unclear Etiology     You have been seen for chest pain. The cause of your pain is not yet known.     Your doctor has learned about your medical history, examined you, and checked any tests that were done. Still, it is unclear why you are having pain. The doctor thinks there is only a very small chance that your pain is caused by a life-threatening condition. Later, your primary care doctor might do more tests or check you again.     Sometimes chest pain is caused by a dangerous condition, like a heart attack, aorta injury, blood clot in the lung, or collapsed lung. It is unlikely that your pain is caused by a life-threatening condition if: Your chest pain lasts only a few seconds at a time; you are not short of breath, nauseated (sick to your stomach), sweaty, or lightheaded; your pain gets worse when you twist or bend; your pain improves with exercise or hard work.     Chest pain is serious. It is VERY IMPORTANT that you follow up with your regular doctor and seek medical attention immediately here or at the nearest Emergency Department if your symptoms become worse or they change.     YOU SHOULD SEEK MEDICAL ATTENTION IMMEDIATELY, EITHER HERE OR AT THE NEAREST EMERGENCY DEPARTMENT, IF ANY OF THE FOLLOWING OCCURS:  · Your pain gets worse.  · Your pain makes you short of breath, nauseated, or sweaty.  · Your pain gets worse when you walk, go up stairs, or exert yourself.  · You feel weak, lightheaded, or faint.  · It hurts to breathe.  · Your leg swells.  · Your symptoms get worse or you have new symptoms or concerns.               Suicidal Ideation     You have been seen today for thoughts of hurting yourself.     It is important to follow up with your counselor and family doctor. If you do not have an appointment in the next 2-3 days, call and make one. It is very important to tell your counselor and family doctor about your worsening problems.     It is important to have a counselor and a family  doctor who see you regularly. They can help you with your problem and watch your progress.     Stay with someone you trust tonight.     You may not have a specific suicide plan, but it is still a good idea to take all weapons out of your home.     AVOID THE USE OF ALCOHOL AND OTHER DRUGS: Using these substances may intensify or increase your suicide thoughts!     YOU SHOULD SEEK MEDICAL ATTENTION IMMEDIATELY, EITHER HERE OR AT THE NEAREST EMERGENCY DEPARTMENT, IF ANY OF THE FOLLOWING OCCURS:  · Worsening or new thoughts of harming yourself (suicidal thoughts).  · Thoughts of harming others.  · You do not feel safe in your home environment.  · You think about hurting yourself or others. The National Suicide Hotline number is 1-800-SUICIDE (1-800-784-2433), or 1-800-273-TALK (8255).

## 2016-02-18 NOTE — ED Notes (Signed)
Pt discharged/paperworkbelongings given. No iv placed during ed stay. [place]/checkered armband removed. Pt unhappy that she is discharged/ refuses to sign. States [place] going [place] go to [facility]. Pt instructed to follow up c outpateint psych. Pt verbalized understanding.

## 2016-02-18 NOTE — ED MD Progress Note (Signed)
Sign-out note:  Assuming care of this 60 year old female initially c/o CP then endorsed SI  EKG/CXR unremarkable and she refused rest of workup  Medically cleared  Level 2  Psych dispo

## 2016-02-18 NOTE — ED Notes (Signed)
Pt resting in bed with eyes closed. Breathing regular, easy, unlabored. NAD noted. Sitter within line of site.

## 2016-02-18 NOTE — ED Notes (Signed)
Pt sitting up in gurney eating meal tray in nad

## 2016-02-18 NOTE — ED Notes (Signed)
Assumed care of pt  Pt resting in gurney in nad c sitter at bedside, awakens to to voice, breathing even/nonlabored, skins warm/dry. Denies any complaints  Will continue to monitor

## 2016-02-18 NOTE — ED Notes (Signed)
This Clinical research associatewriter is Research officer, trade unionrelieving sitter Keith for a break.

## 2016-02-18 NOTE — ED Notes (Signed)
Please call [facility] if pt is transferred or admitted [phone]

## 2016-02-18 NOTE — ED Notes (Signed)
Notified Pokrajac MD pt requesting percocet & robaxin

## 2016-02-19 LAB — ECG 12-LEAD
ATRIAL RATE: 52 {beats}/min
P AXIS: 62 degrees
PR INTERVAL: 152 ms
QRS INTERVAL/DURATION: 94 ms
QT: 476 ms
QTC INTERVAL: 442 ms
R AXIS: 25 degrees
T AXIS: 55 degrees
VENTRICULAR RATE: 52 {beats}/min

## 2016-02-27 NOTE — ED Follow-up Note (Signed)
Follow-up type: Callback       Routine ED Patient Call Back    Patient unable to be contacted, no message left

## 2016-05-28 ENCOUNTER — Encounter (HOSPITAL_COMMUNITY): Payer: Self-pay | Admitting: Emergency Medicine

## 2016-06-13 ENCOUNTER — Inpatient Hospital Stay: Admit: 2016-06-13 | Payer: Self-pay

## 2016-07-19 ENCOUNTER — Other Ambulatory Visit (HOSPITAL_BASED_OUTPATIENT_CLINIC_OR_DEPARTMENT_OTHER): Payer: Self-pay | Admitting: Surgery

## 2016-07-19 ENCOUNTER — Other Ambulatory Visit (HOSPITAL_BASED_OUTPATIENT_CLINIC_OR_DEPARTMENT_OTHER): Payer: 59

## 2016-07-19 ENCOUNTER — Inpatient Hospital Stay
Admission: AC | Admit: 2016-07-19 | Discharge: 2016-07-23 | DRG: 481 | Payer: 59 | Attending: Surgical Critical Care | Admitting: Surgical Critical Care

## 2016-07-19 DIAGNOSIS — Z9119 Patient's noncompliance with other medical treatment and regimen: Secondary | ICD-10-CM

## 2016-07-19 DIAGNOSIS — M6282 Rhabdomyolysis: Secondary | ICD-10-CM | POA: Diagnosis present

## 2016-07-19 DIAGNOSIS — S72002A Fracture of unspecified part of neck of left femur, initial encounter for closed fracture: Secondary | ICD-10-CM

## 2016-07-19 DIAGNOSIS — R402254 Coma scale, best verbal response, oriented, 24 hours or more after hospital admission: Secondary | ICD-10-CM | POA: Diagnosis present

## 2016-07-19 DIAGNOSIS — S43102A Unspecified dislocation of left acromioclavicular joint, initial encounter: Secondary | ICD-10-CM | POA: Diagnosis present

## 2016-07-19 DIAGNOSIS — Z9884 Bariatric surgery status: Secondary | ICD-10-CM

## 2016-07-19 DIAGNOSIS — R451 Restlessness and agitation: Secondary | ICD-10-CM | POA: Diagnosis present

## 2016-07-19 DIAGNOSIS — T07XXXA Unspecified multiple injuries, initial encounter: Secondary | ICD-10-CM

## 2016-07-19 DIAGNOSIS — S60812A Abrasion of left wrist, initial encounter: Secondary | ICD-10-CM | POA: Diagnosis present

## 2016-07-19 DIAGNOSIS — S30811A Abrasion of abdominal wall, initial encounter: Secondary | ICD-10-CM

## 2016-07-19 DIAGNOSIS — S0083XA Contusion of other part of head, initial encounter: Secondary | ICD-10-CM | POA: Diagnosis present

## 2016-07-19 DIAGNOSIS — S8002XA Contusion of left knee, initial encounter: Secondary | ICD-10-CM | POA: Diagnosis present

## 2016-07-19 DIAGNOSIS — M84352A Stress fracture, left femur, initial encounter for fracture: Secondary | ICD-10-CM

## 2016-07-19 DIAGNOSIS — M5030 Other cervical disc degeneration, unspecified cervical region: Secondary | ICD-10-CM

## 2016-07-19 DIAGNOSIS — F29 Unspecified psychosis not due to a substance or known physiological condition: Secondary | ICD-10-CM | POA: Diagnosis present

## 2016-07-19 DIAGNOSIS — F1510 Other stimulant abuse, uncomplicated: Secondary | ICD-10-CM

## 2016-07-19 DIAGNOSIS — Z915 Personal history of self-harm: Secondary | ICD-10-CM

## 2016-07-19 DIAGNOSIS — W1789XA Other fall from one level to another, initial encounter: Secondary | ICD-10-CM

## 2016-07-19 DIAGNOSIS — Z043 Encounter for examination and observation following other accident: Secondary | ICD-10-CM

## 2016-07-19 DIAGNOSIS — E870 Hyperosmolality and hypernatremia: Secondary | ICD-10-CM | POA: Diagnosis present

## 2016-07-19 DIAGNOSIS — I959 Hypotension, unspecified: Secondary | ICD-10-CM | POA: Diagnosis not present

## 2016-07-19 DIAGNOSIS — R51 Headache: Secondary | ICD-10-CM | POA: Diagnosis not present

## 2016-07-19 DIAGNOSIS — R402364 Coma scale, best motor response, obeys commands, 24 hours or more after hospital admission: Secondary | ICD-10-CM | POA: Diagnosis present

## 2016-07-19 DIAGNOSIS — S72142A Displaced intertrochanteric fracture of left femur, initial encounter for closed fracture: Principal | ICD-10-CM | POA: Diagnosis present

## 2016-07-19 DIAGNOSIS — N179 Acute kidney failure, unspecified: Secondary | ICD-10-CM | POA: Diagnosis present

## 2016-07-19 DIAGNOSIS — R410 Disorientation, unspecified: Secondary | ICD-10-CM | POA: Diagnosis present

## 2016-07-19 DIAGNOSIS — F15129 Other stimulant abuse with intoxication, unspecified: Secondary | ICD-10-CM | POA: Diagnosis present

## 2016-07-19 DIAGNOSIS — F17200 Nicotine dependence, unspecified, uncomplicated: Secondary | ICD-10-CM | POA: Diagnosis present

## 2016-07-19 DIAGNOSIS — S20112A Abrasion of breast, left breast, initial encounter: Secondary | ICD-10-CM | POA: Diagnosis present

## 2016-07-19 DIAGNOSIS — M1990 Unspecified osteoarthritis, unspecified site: Secondary | ICD-10-CM | POA: Diagnosis present

## 2016-07-19 DIAGNOSIS — R262 Difficulty in walking, not elsewhere classified: Secondary | ICD-10-CM

## 2016-07-19 DIAGNOSIS — R825 Elevated urine levels of drugs, medicaments and biological substances: Secondary | ICD-10-CM | POA: Diagnosis present

## 2016-07-19 DIAGNOSIS — R4182 Altered mental status, unspecified: Secondary | ICD-10-CM

## 2016-07-19 DIAGNOSIS — R402413 Glasgow coma scale score 13-15, at hospital admission: Secondary | ICD-10-CM | POA: Diagnosis present

## 2016-07-19 DIAGNOSIS — S40011A Contusion of right shoulder, initial encounter: Secondary | ICD-10-CM | POA: Diagnosis present

## 2016-07-19 DIAGNOSIS — S301XXA Contusion of abdominal wall, initial encounter: Secondary | ICD-10-CM | POA: Diagnosis present

## 2016-07-19 DIAGNOSIS — Y9389 Activity, other specified: Secondary | ICD-10-CM

## 2016-07-19 DIAGNOSIS — D62 Acute posthemorrhagic anemia: Secondary | ICD-10-CM | POA: Diagnosis present

## 2016-07-19 DIAGNOSIS — Z79899 Other long term (current) drug therapy: Secondary | ICD-10-CM

## 2016-07-19 DIAGNOSIS — S50312A Abrasion of left elbow, initial encounter: Secondary | ICD-10-CM | POA: Diagnosis present

## 2016-07-19 DIAGNOSIS — Z888 Allergy status to other drugs, medicaments and biological substances status: Secondary | ICD-10-CM

## 2016-07-19 DIAGNOSIS — R402144 Coma scale, eyes open, spontaneous, 24 hours or more after hospital admission: Secondary | ICD-10-CM | POA: Diagnosis present

## 2016-07-19 DIAGNOSIS — F319 Bipolar disorder, unspecified: Secondary | ICD-10-CM | POA: Diagnosis present

## 2016-07-19 DIAGNOSIS — E669 Obesity, unspecified: Secondary | ICD-10-CM | POA: Diagnosis present

## 2016-07-19 DIAGNOSIS — S80812A Abrasion, left lower leg, initial encounter: Secondary | ICD-10-CM | POA: Diagnosis present

## 2016-07-19 DIAGNOSIS — R339 Retention of urine, unspecified: Secondary | ICD-10-CM | POA: Diagnosis not present

## 2016-07-19 DIAGNOSIS — F1110 Opioid abuse, uncomplicated: Secondary | ICD-10-CM | POA: Diagnosis present

## 2016-07-19 DIAGNOSIS — E785 Hyperlipidemia, unspecified: Secondary | ICD-10-CM | POA: Diagnosis present

## 2016-07-19 DIAGNOSIS — E876 Hypokalemia: Secondary | ICD-10-CM | POA: Diagnosis present

## 2016-07-19 DIAGNOSIS — M25521 Pain in right elbow: Secondary | ICD-10-CM | POA: Diagnosis present

## 2016-07-19 DIAGNOSIS — S0511XA Contusion of eyeball and orbital tissues, right eye, initial encounter: Secondary | ICD-10-CM | POA: Diagnosis present

## 2016-07-19 DIAGNOSIS — F15188 Other stimulant abuse with other stimulant-induced disorder: Secondary | ICD-10-CM | POA: Diagnosis present

## 2016-07-19 DIAGNOSIS — Z96651 Presence of right artificial knee joint: Secondary | ICD-10-CM | POA: Diagnosis present

## 2016-07-19 DIAGNOSIS — W19XXXA Unspecified fall, initial encounter: Secondary | ICD-10-CM

## 2016-07-19 DIAGNOSIS — W130XXA Fall from, out of or through balcony, initial encounter: Secondary | ICD-10-CM | POA: Diagnosis present

## 2016-07-19 DIAGNOSIS — I10 Essential (primary) hypertension: Secondary | ICD-10-CM | POA: Diagnosis present

## 2016-07-19 HISTORY — DX: Displaced intertrochanteric fracture of left femur, initial encounter for closed fracture (CMS-HCC): S72.142A

## 2016-07-19 HISTORY — DX: Other stimulant abuse, uncomplicated (CMS-HCC): F15.10

## 2016-07-19 HISTORY — DX: Essential (primary) hypertension: I10

## 2016-07-19 HISTORY — DX: Bipolar disorder, unspecified (CMS-HCC): F31.9

## 2016-07-19 HISTORY — DX: Hyperlipidemia, unspecified: E78.5

## 2016-07-19 HISTORY — DX: Opioid abuse, uncomplicated (CMS-HCC): F11.10

## 2016-07-19 LAB — APTT, BLOOD: PTT: 30.3 s (ref 25.0–34.0)

## 2016-07-19 LAB — VBG+O2SAT+O2HBV
BE, Ven: 1.9 mmol/L
FIO2, Ven: 21 %
HCO3, Ven: 26 mmol/L
O2 Hgb, Ven: 54.9 (ref 40.0–70.0)
O2 Sat, Ven: 56.5 %
Temp, Ven: 36.4 'C
pCO2, Ven (T): 35 mmHg — ABNORMAL LOW (ref 40–52)
pCO2, Ven (Uncorr): 36 mmHg — ABNORMAL LOW (ref 40–52)
pH, Ven (T): 7.47 — ABNORMAL HIGH (ref 7.33–7.40)
pH, Ven (Uncorr): 7.46 — ABNORMAL HIGH (ref 7.33–7.40)
pO2, Ven (T): 26 mmHg (ref 25–44)
pO2, Ven (Uncorr): 27 mmHg (ref 25–44)

## 2016-07-19 LAB — HEMOGRAM, BLOOD
Hct: 36.3 %
Hgb: 12 gm/dL
MCH: 26.7 pg (ref 26.0–32.0)
MCHC: 33.1 g/dL (ref 32.0–36.0)
MCV: 80.8 um3 (ref 79.0–95.0)
MPV: 9.2 fL — ABNORMAL LOW (ref 9.4–12.4)
Plt Count: 247 10*3/uL (ref 140–370)
RBC: 4.49 10*6/uL
RDW: 18.2 % — ABNORMAL HIGH (ref 12.0–14.0)
WBC: 14.7 10*3/uL — ABNORMAL HIGH (ref 4.0–10.0)

## 2016-07-19 LAB — BASIC METABOLIC PANEL, BLOOD
Anion Gap: 18 mmol/L — ABNORMAL HIGH (ref 7–15)
BUN: 20 mg/dL (ref 8–23)
Bicarbonate: 23 mmol/L (ref 22–29)
Calcium: 9.6 mg/dL (ref 8.2–9.6)
Chloride: 102 mmol/L (ref 98–107)
Creatinine: 1.21 mg/dL
GFR: 53 mL/min
Glucose: 102 mg/dL — ABNORMAL HIGH (ref 70–99)
Potassium: 3.8 mmol/L (ref 3.5–5.1)
Sodium: 143 mmol/L (ref 136–145)

## 2016-07-19 LAB — TROPONIN T, BLOOD: Troponin T: 0.07 ng/mL (ref <0.01)

## 2016-07-19 LAB — PROTHROMBIN TIME, BLOOD
INR: 1.1
PT,Patient: 11.8 s (ref 9.7–12.5)

## 2016-07-19 LAB — ALCOHOL, BLOOD: Alcohol: <11 mg/dL

## 2016-07-19 LAB — CKMB+INDEX (NO CPK), BLOOD
CK-MB Index: 0.2 %
CK-MB: 22 ng/mL

## 2016-07-19 LAB — PHOSPHORUS, BLOOD: Phosphorous: 2.2 mg/dL — ABNORMAL LOW (ref 2.7–4.5)

## 2016-07-19 LAB — MAGNESIUM, BLOOD: Magnesium: 2.4 mg/dL — ABNORMAL HIGH (ref 1.7–2.3)

## 2016-07-19 LAB — CPK-CREATINE PHOSPHOKINASE, BLOOD: CPK: 11820 U/L (ref 0–175)

## 2016-07-19 LAB — BLOOD GAS HGB/HCT, VENOUS
Hct (Est), Ven: 33 %
Hgb, Ven: 11.1 g/dL

## 2016-07-19 MED ORDER — NALOXONE HCL 0.4 MG/ML IJ SOLN
0.1000 mg | INTRAMUSCULAR | Status: DC | PRN
Start: 2016-07-19 — End: 2016-07-23

## 2016-07-19 MED ORDER — TETANUS-DIPHTH-ACELL PERTUSSIS 5-2.5-18.5 LF-MCG/0.5 IM SUSP
INTRAMUSCULAR | Status: AC
Start: 2016-07-19 — End: ?
  Filled 2016-07-19: qty 0.5

## 2016-07-19 MED ORDER — FENTANYL CITRATE (PF) 100 MCG/2ML IJ SOLN
INTRAMUSCULAR | Status: AC
Start: 2016-07-19 — End: ?
  Filled 2016-07-19: qty 2

## 2016-07-19 MED ORDER — SENNA 8.6 MG OR TABS
2.0000 | ORAL_TABLET | Freq: Every morning | ORAL | Status: DC
Start: 2016-07-20 — End: 2016-07-23
  Administered 2016-07-22 – 2016-07-23 (×2): 17.2 mg via ORAL
  Filled 2016-07-19 (×3): qty 2

## 2016-07-19 MED ORDER — HYDROMORPHONE HCL 1 MG/ML IJ SOLN
1.00 mg | Freq: Once | INTRAMUSCULAR | Status: AC
Start: 2016-07-20 — End: 2016-07-19
  Administered 2016-07-19: 1 mg via INTRAVENOUS

## 2016-07-19 MED ORDER — DOCUSATE SODIUM 250 MG OR CAPS
250.0000 mg | ORAL_CAPSULE | Freq: Every evening | ORAL | Status: DC
Start: 2016-07-19 — End: 2016-07-23
  Administered 2016-07-20 – 2016-07-22 (×3): 250 mg via ORAL
  Filled 2016-07-19 (×3): qty 1

## 2016-07-19 MED ORDER — LIDOCAINE-EPINEPHRINE 1 %-1:100000 IJ SOLN
INTRAMUSCULAR | Status: AC
Start: 2016-07-19 — End: ?
  Filled 2016-07-19: qty 40

## 2016-07-19 MED ORDER — SODIUM CHLORIDE 0.9 % IV SOLN
INTRAVENOUS | Status: DC
Start: 2016-07-19 — End: 2016-07-20
  Administered 2016-07-19: via INTRAVENOUS

## 2016-07-19 MED ORDER — HYDROMORPHONE HCL 1 MG/ML IJ SOLN
INTRAMUSCULAR | Status: AC
Start: 2016-07-19 — End: 2016-07-19
  Administered 2016-07-19: 1 mg via INTRAVENOUS
  Filled 2016-07-19: qty 1

## 2016-07-19 MED ORDER — HYDROMORPHONE HCL 1 MG/ML IJ SOLN
0.5000 mg | INTRAMUSCULAR | Status: DC | PRN
Start: 2016-07-19 — End: 2016-07-20

## 2016-07-19 MED ORDER — ACETAMINOPHEN 325 MG PO TABS
650.0000 mg | ORAL_TABLET | ORAL | Status: DC | PRN
Start: 2016-07-19 — End: 2016-07-23
  Administered 2016-07-20 – 2016-07-21 (×2): 650 mg via ORAL
  Filled 2016-07-19 (×2): qty 2

## 2016-07-19 MED ORDER — MIDAZOLAM HCL 2 MG/2ML IJ SOLN
INTRAMUSCULAR | Status: AC
Start: 2016-07-19 — End: ?
  Filled 2016-07-19: qty 8

## 2016-07-19 MED ORDER — HYDROMORPHONE HCL 1 MG/ML IJ SOLN
INTRAMUSCULAR | Status: AC
Start: 2016-07-19 — End: ?
  Filled 2016-07-19: qty 1

## 2016-07-19 MED ORDER — MIDAZOLAM HCL 2 MG/2ML IJ SOLN
INTRAMUSCULAR | Status: AC
Start: 2016-07-19 — End: ?
  Filled 2016-07-19: qty 2

## 2016-07-19 MED ORDER — HYDROMORPHONE HCL 1 MG/ML IJ SOLN
1.0000 mg | INTRAMUSCULAR | Status: DC | PRN
Start: 2016-07-19 — End: 2016-07-20
  Administered 2016-07-19: 1 mg via INTRAVENOUS
  Filled 2016-07-19: qty 1

## 2016-07-19 MED ORDER — FENTANYL CITRATE (PF) 100 MCG/2ML IJ SOLN
INTRAMUSCULAR | Status: AC
Start: 2016-07-19 — End: ?
  Filled 2016-07-19: qty 4
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
## 2016-07-19 NOTE — H&P (Signed)
TRAUMA HISTORY AND PHYSICAL    Attending MD: Ferdinand LangoGodat    AliasAndreas Ohm: [place], 38  Name: Alexandria Proctor  DOB: UTA  Contact: UTA    Mechanism of Trauma: Fall    History of Present Illness: 36F who fell from a balcony while trying to cross to a lower balcony. Repeats "I'm not crazy"    Past Medical and Surgical History:   Endorses HTN, otherwise unable to obtain    Allergies: Endorses allergies, toradol, tramadol, reglan, haldol    Medications: lipitor, on blood pressure meds    Social History  Alcohol: unable to obtain  Tobacco: unable to obtain  Illicit: unable to obtain    Tetanus shot: Unknown    Family History: Unable to obtain    Review of Systems: Unable to obtain    Vitals:     Temp: 36.4  BP: 118/104  HR: 85  RR: 23  SpO2: 99    Physical Exam:  General Appearance: Adult woman in significant acute distress, intermittently screaming   Neuro: GCS 13 (E4, V4, M5), A&Ox1, motor grossly intact in all extremities.   Eyes: Conjunctivae and corneas clear. Pupils reactive (4 to 2 mm on R, 3 to 1 mm on L). EOM's intact., swelling and ecchymosis to R eye  Ears: Hearing grossly intact; no frank blood or hemotympanum  Nose: 5mm lac to nasal bridge  Mouth: Airway intact; atraumatic; no malocclusion  Neck: Unable to assess cspine tenderness, no step offs or deformities   Head: No cephalohematoma or deformities.  Back: Diffuse T spine, no L-spine tenderness, no step-offs or deformities.    Heart: RRR  Lungs: Clear to auscultation bilaterally; no chest wall tenderness to palpation, abrasion to L breast  Abdomen: Soft, non-tender, non-distended, no rebound/guarding, abrasions with ecchymoses    Pelvis: Stable to compression; tender to palpation  Rectal: Good tone, no gross blood.  MSK: Motor strength grossly intact in all extremities. 2+ peripheral pulses.    LUE: abrasions posterior aspect L elbow   LLE: Severe pain in L hip old abrasion anterior aspect L calf, ecchymosis L knee, 2+ pulses, deformity with external rotation at L  hip   RLE: atraumatic, no pain in R hip   RUE: ecchymoses R shoulder, pain in R elbow  Skin: No lacerations, no abrasions.    Labs and Other Data:  Pending    Assessment and Care Plan:   Adult woman with L leg deformity, in significant acute distress    CT Head, C-Spine  Ortho consult  CXR, PXR, Plain films L hip, femur, knee, R elbow  FAST   NPO, IVF  Labs  Admit to Trauma
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
## 2016-07-19 NOTE — Procedures (Signed)
Alexandria Proctor is a 60 year old unknown patient.  No diagnosis found.  No past medical history on file.  There were no vitals taken for this visit.    Central Line  Date/Time: 07/19/2016 9:41 PM  Performed by: Homero FellersLEE, Wyoma Genson MARIAN  Authorized by: Dennie MaizesGODAT, LAURA N   Consent: The procedure was performed in an emergent situation.  Risks and benefits: risks, benefits and alternatives were discussed  Required items: required blood products, implants, devices, and special equipment available  Patient identity confirmed: arm band and hospital-assigned identification number  Indications: vascular access  Anesthesia: local infiltration  Local Anesthetic: lidocaine 1% without epinephrine  Anesthetic total: 3 mL  Pre-placement Checklist  Hand hygiene by all in room/procedure room: yes  CHG applicator used: yes   30 second scrub and 2 minute dry: yes  Operator wore: sterile gloves, mask with eye shield, sterile gown and hat  Head to toe sterile drape, mask, cap, sterile gown, sterile gloves: yes  Sterile field maintained during procedure: Sterile field maintained during procedure  Sterile technique maintained while applying dressing: Yes  CHG impregnated site patch placed: Yes  Dressing Date/Time: 07/19/2016 9:44 PM  Ointment applied: noPre-procedure: landmarks identified  Preparation: skin prepped with chlorhexidine  Insertion side: right   Insertion site: femoral  Catheter tip location: Femoral vein  Patient position: flat  Catheter type: triple lumen  Ultrasound guidance: yes  Ultrasound guide placement: vessel located  Indications for ultrasound: safety    Reason for insertion: new indication  Number of attempts: 2  Successful placement: yes  Estimated Blood Loss: 5 ml  Attending was present  Post-procedure: line sutured and dressing applied  Assessment: blood return through all parts  Complications: none  Follow-up: none            Homero FellersArielle Marian Philipp Callegari, MD  07/19/2016

## 2016-07-20 ENCOUNTER — Inpatient Hospital Stay (HOSPITAL_COMMUNITY): Payer: 59 | Admitting: Anesthesiology

## 2016-07-20 ENCOUNTER — Inpatient Hospital Stay (HOSPITAL_COMMUNITY): Admit: 2016-07-20 | Payer: Self-pay | Admitting: Orthopaedic Surgery

## 2016-07-20 ENCOUNTER — Encounter (HOSPITAL_COMMUNITY): Payer: Self-pay | Admitting: Orthopaedic Surgery

## 2016-07-20 ENCOUNTER — Encounter (HOSPITAL_COMMUNITY)
Admission: AC | Disposition: A | Discharge status: Other Acute Inpatient Hospital | Payer: Self-pay | Attending: Surgical Critical Care

## 2016-07-20 DIAGNOSIS — E669 Obesity, unspecified: Secondary | ICD-10-CM

## 2016-07-20 DIAGNOSIS — W130XXA Fall from, out of or through balcony, initial encounter: Secondary | ICD-10-CM

## 2016-07-20 DIAGNOSIS — Z96698 Presence of other orthopedic joint implants: Secondary | ICD-10-CM

## 2016-07-20 DIAGNOSIS — S72142A Displaced intertrochanteric fracture of left femur, initial encounter for closed fracture: Principal | ICD-10-CM

## 2016-07-20 DIAGNOSIS — I10 Essential (primary) hypertension: Secondary | ICD-10-CM

## 2016-07-20 DIAGNOSIS — E785 Hyperlipidemia, unspecified: Secondary | ICD-10-CM

## 2016-07-20 DIAGNOSIS — F1510 Other stimulant abuse, uncomplicated: Secondary | ICD-10-CM

## 2016-07-20 DIAGNOSIS — T796XXA Traumatic ischemia of muscle, initial encounter: Secondary | ICD-10-CM

## 2016-07-20 DIAGNOSIS — Z96652 Presence of left artificial knee joint: Secondary | ICD-10-CM

## 2016-07-20 DIAGNOSIS — M25462 Effusion, left knee: Secondary | ICD-10-CM

## 2016-07-20 DIAGNOSIS — S7222XA Displaced subtrochanteric fracture of left femur, initial encounter for closed fracture: Secondary | ICD-10-CM

## 2016-07-20 DIAGNOSIS — F319 Bipolar disorder, unspecified: Secondary | ICD-10-CM

## 2016-07-20 DIAGNOSIS — M159 Polyosteoarthritis, unspecified: Secondary | ICD-10-CM

## 2016-07-20 DIAGNOSIS — F1110 Opioid abuse, uncomplicated: Secondary | ICD-10-CM

## 2016-07-20 LAB — BASIC METABOLIC PANEL, BLOOD
Anion Gap: 13 mmol/L (ref 7–15)
Anion Gap: 14 mmol/L (ref 7–15)
Anion Gap: 11 mmol/L (ref 7–15)
Anion Gap: 8 mmol/L (ref 7–15)
BUN: 18 mg/dL (ref 8–23)
BUN: 19 mg/dL (ref 8–23)
BUN: 16 mg/dL (ref 8–23)
BUN: 13 mg/dL (ref 8–23)
Bicarbonate: 25 mmol/L (ref 22–29)
Bicarbonate: 25 mmol/L (ref 22–29)
Bicarbonate: 25 mmol/L (ref 22–29)
Bicarbonate: 29 mmol/L (ref 22–29)
Calcium: 8.4 mg/dL (ref 8.2–9.6)
Calcium: 8.4 mg/dL (ref 8.2–9.6)
Calcium: 8.3 mg/dL (ref 8.2–9.6)
Calcium: 7.9 mg/dL — ABNORMAL LOW (ref 8.2–9.6)
Chloride: 106 mmol/L (ref 98–107)
Chloride: 107 mmol/L (ref 98–107)
Chloride: 108 mmol/L — ABNORMAL HIGH (ref 98–107)
Chloride: 105 mmol/L (ref 98–107)
Creatinine: 1.04 mg/dL
Creatinine: 1.01 mg/dL
Creatinine: 0.95 mg/dL
Creatinine: 0.89 mg/dL
GFR: >60 mL/min
GFR: >60 mL/min
GFR: >60 mL/min
GFR: >60 mL/min
Glucose: 102 mg/dL — ABNORMAL HIGH (ref 70–99)
Glucose: 110 mg/dL — ABNORMAL HIGH (ref 70–99)
Glucose: 122 mg/dL — ABNORMAL HIGH (ref 70–99)
Glucose: 109 mg/dL — ABNORMAL HIGH (ref 70–99)
Potassium: 3.5 mmol/L (ref 3.5–5.1)
Potassium: 3.5 mmol/L (ref 3.5–5.1)
Potassium: 3.8 mmol/L (ref 3.5–5.1)
Potassium: 4.1 mmol/L (ref 3.5–5.1)
Sodium: 144 mmol/L (ref 136–145)
Sodium: 146 mmol/L — ABNORMAL HIGH (ref 136–145)
Sodium: 144 mmol/L (ref 136–145)
Sodium: 142 mmol/L (ref 136–145)

## 2016-07-20 LAB — CPK-CREATINE PHOSPHOKINASE, BLOOD
CPK: 11336 U/L (ref 0–175)
CPK: 12397 U/L (ref 0–175)
CPK: 11362 U/L (ref 0–175)
CPK: 11306 U/L (ref 0–175)

## 2016-07-20 LAB — MDIFF
ANC-Manual Mode: 8.1 10*3/uL — ABNORMAL HIGH (ref 1.6–7.0)
Abs Eosinophils: 0.2 10*3/uL (ref 0.1–0.5)
Abs Lymphs: 0.5 10*3/uL — ABNORMAL LOW (ref 0.8–3.1)
Abs Monos: 0.4 10*3/uL (ref 0.2–0.8)
Eosinophils: 2 %
Lymphocytes: 5 %
Monocytes: 4 %
Number of Cells Counted: 117
Plt Est: ADEQUATE
RBC Comment: NORMAL
Segs: 89 %

## 2016-07-20 LAB — VBG+O2HBV+O2S+O2CNV
BE, Ven: -0.9 mmol/L
FIO2, Ven: 100 %
HCO3, Ven: 24 mmol/L
Hct (Est), Ven: 23 %
Hgb, Ven: 7.7 g/dL
O2 Content, Ven: 9.6 vol %
O2 Hgb, Ven: 88.3 — ABNORMAL HIGH (ref 40.0–70.0)
O2 Sat, Ven: 91.5 %
Temp, Ven: 35.1 'C
pCO2, Ven (T): 39 mmHg — ABNORMAL LOW (ref 40–52)
pCO2, Ven (Uncorr): 42 mmHg (ref 40–52)
pH, Ven (T): 7.4 (ref 7.33–7.40)
pH, Ven (Uncorr): 7.37 (ref 7.33–7.40)
pO2, Ven (T): 52 mmHg — ABNORMAL HIGH (ref 25–44)
pO2, Ven (Uncorr): 59 mmHg — ABNORMAL HIGH (ref 25–44)

## 2016-07-20 LAB — ABO/RH CONFIRMATION: ABO/RH: B POS

## 2016-07-20 LAB — CBC WITH DIFF, BLOOD
ANC-Manual Mode: 8.1 10*3/uL — ABNORMAL HIGH (ref 1.6–7.0)
Abs Eosinophils: 0.2 10*3/uL (ref 0.1–0.5)
Abs Lymphs: 0.5 10*3/uL — ABNORMAL LOW (ref 0.8–3.1)
Abs Monos: 0.4 10*3/uL (ref 0.2–0.8)
Eosinophils: 2 %
Hct: 31.9 %
Hgb: 10.1 gm/dL
Lymphocytes: 5 %
MCH: 25.9 pg — ABNORMAL LOW (ref 26.0–32.0)
MCHC: 31.7 g/dL — ABNORMAL LOW (ref 32.0–36.0)
MCV: 81.8 um3 (ref 79.0–95.0)
MPV: 9.5 fL (ref 9.4–12.4)
Monocytes: 4 %
Plt Count: 239 10*3/uL (ref 140–370)
RBC: 3.9 10*6/uL
RDW: 18.3 % — ABNORMAL HIGH (ref 12.0–14.0)
Segs: 89 %
WBC: 9.1 10*3/uL (ref 4.0–10.0)

## 2016-07-20 LAB — UR DRUGS OF ABUSE SCREEN
Barbiturates Screen: NEGATIVE
Cocaine Screen: NEGATIVE
Methadone Screen: NEGATIVE
Opiates Screen: NEGATIVE
Oxycodone Screen: NEGATIVE
Phencyclidine Screen: NEGATIVE

## 2016-07-20 LAB — TROPONIN T, BLOOD: Troponin T: <0.01 ng/mL (ref <0.01)

## 2016-07-20 LAB — MAGNESIUM, BLOOD: Magnesium: 2.2 mg/dL (ref 1.7–2.3)

## 2016-07-20 LAB — TSH, BLOOD: TSH: 2.58 u[IU]/mL (ref 0.27–4.20)

## 2016-07-20 LAB — PHOSPHORUS, BLOOD: Phosphorous: 3.6 mg/dL (ref 2.7–4.5)

## 2016-07-20 LAB — SODIUM, VENOUS BLOOD GAS: Sodium, Ven: 140 mmol/L (ref 135–145)

## 2016-07-20 LAB — POTASSIUM, VENOUS BLOOD GAS: Potassium, Ven: 3.4 mmol/L — ABNORMAL LOW (ref 3.5–5.0)

## 2016-07-20 LAB — GLUCOSE (POCT)
Glucose (POCT): 115 mg/dL — ABNORMAL HIGH (ref 70–99)
Glucose (POCT): 101 mg/dL — ABNORMAL HIGH (ref 70–99)
Glucose (POCT): 132 mg/dL — ABNORMAL HIGH (ref 70–99)

## 2016-07-20 LAB — TYPE & SCREEN
ABO/RH: B POS
Antibody Screen: NEGATIVE

## 2016-07-20 LAB — GLUCOSE, VENOUS BLOOD GAS: Glucose, Ven: 71 mg/dL (ref 65–110)

## 2016-07-20 LAB — CKMB+INDEX (NO CPK), BLOOD
CK-MB Index: 0.2 %
CK-MB: 25 ng/mL

## 2016-07-20 LAB — CALCIUM, IONIZED, VENOUS BLOOD GAS: Calcium, Ven: 1.18 mmol/L (ref 1.13–1.32)

## 2016-07-20 SURGERY — INTRAMEDULLARY RODDING, FEMUR
Anesthesia: General | Site: Leg Upper | Laterality: Left | Wound class: Class I (Clean)

## 2016-07-20 MED ORDER — LACTATED RINGERS IV SOLN
Freq: Once | INTRAVENOUS | Status: AC
Start: 2016-07-20 — End: 2016-07-20
  Administered 2016-07-20: 10:00:00 via INTRAVENOUS

## 2016-07-20 MED ORDER — ALBUMIN HUMAN 5 % IV SOLN
INTRAVENOUS | Status: DC | PRN
Start: 2016-07-20 — End: 2016-07-20
  Administered 2016-07-20: 12:00:00 via INTRAVENOUS

## 2016-07-20 MED ORDER — ONDANSETRON HCL 4 MG/2ML IV SOLN
INTRAMUSCULAR | Status: DC | PRN
Start: 2016-07-20 — End: 2016-07-20
  Administered 2016-07-20: 4 mg via INTRAVENOUS

## 2016-07-20 MED ORDER — MIDAZOLAM HCL 2 MG/2ML IJ SOLN
INTRAMUSCULAR | Status: DC | PRN
Start: 2016-07-20 — End: 2016-07-20
  Administered 2016-07-20 (×2): 2 mg via INTRAVENOUS

## 2016-07-20 MED ORDER — HYDROMORPHONE HCL 1 MG/ML IJ SOLN
0.5000 mg | INTRAMUSCULAR | Status: DC | PRN
Start: 2016-07-20 — End: 2016-07-21
  Administered 2016-07-20 – 2016-07-21 (×2): 0.5 mg via INTRAVENOUS

## 2016-07-20 MED ORDER — SODIUM CHLORIDE 0.9 % IV SOLN
10.0000 mg | INTRAVENOUS | Status: DC | PRN
Start: 2016-07-20 — End: 2016-07-20
  Administered 2016-07-20: 20 ug/min via INTRAVENOUS

## 2016-07-20 MED ORDER — OLANZAPINE ODT 5 MG OR TBDP
2.5000 mg | ORAL_TABLET | Freq: Four times a day (QID) | ORAL | Status: DC | PRN
Start: 2016-07-20 — End: 2016-07-23
  Administered 2016-07-20 – 2016-07-22 (×4): 2.5 mg via ORAL
  Filled 2016-07-20 (×3): qty 1

## 2016-07-20 MED ORDER — SODIUM CHLORIDE 0.9 % IV BOLUS
500.00 mL | INJECTION | Freq: Once | INTRAVENOUS | Status: AC
Start: 2016-07-20 — End: 2016-07-20
  Administered 2016-07-20: 500 mL via INTRAVENOUS

## 2016-07-20 MED ORDER — CEFAZOLIN SODIUM 1 GM IJ SOLR
INTRAMUSCULAR | Status: DC | PRN
Start: 2016-07-20 — End: 2016-07-20
  Administered 2016-07-20: 2000 mg via INTRAVENOUS

## 2016-07-20 MED ORDER — ONDANSETRON HCL 4 MG/2ML IV SOLN
4.0000 mg | Freq: Once | INTRAMUSCULAR | Status: DC | PRN
Start: 2016-07-20 — End: 2016-07-20

## 2016-07-20 MED ORDER — PROPOFOL 200 MG/20ML IV EMUL
INTRAVENOUS | Status: DC | PRN
Start: 2016-07-20 — End: 2016-07-20
  Administered 2016-07-20: 25 mg via INTRAVENOUS
  Administered 2016-07-20: 75 mg via INTRAVENOUS

## 2016-07-20 MED ORDER — LORAZEPAM 2 MG/ML IJ SOLN
1.00 mg | Freq: Once | INTRAMUSCULAR | Status: AC
Start: 2016-07-20 — End: 2016-07-20
  Administered 2016-07-20: 1 mg via INTRAVENOUS
  Filled 2016-07-20: qty 1

## 2016-07-20 MED ORDER — MEPERIDINE HCL 25 MG/ML IJ SOLN
12.5000 mg | INTRAMUSCULAR | Status: DC | PRN
Start: 2016-07-20 — End: 2016-07-20

## 2016-07-20 MED ORDER — HYDROMORPHONE HCL 1 MG/ML IJ SOLN
1.00 mg | Freq: Once | INTRAMUSCULAR | Status: AC
Start: 2016-07-20 — End: 2016-07-20
  Administered 2016-07-20: 1 mg via INTRAVENOUS
  Filled 2016-07-20: qty 1

## 2016-07-20 MED ORDER — FAMOTIDINE 20 MG OR TABS
20.0000 mg | ORAL_TABLET | Freq: Two times a day (BID) | ORAL | Status: DC
Start: 2016-07-20 — End: 2016-07-21
  Administered 2016-07-20: 20 mg via ORAL
  Filled 2016-07-20: qty 1

## 2016-07-20 MED ORDER — FENTANYL CITRATE (PF) 250 MCG/5ML IJ SOLN
INTRAMUSCULAR | Status: DC | PRN
Start: 2016-07-20 — End: 2016-07-20
  Administered 2016-07-20: 200 ug via INTRAVENOUS

## 2016-07-20 MED ORDER — ONDANSETRON HCL 4 MG/2ML IV SOLN
4.0000 mg | Freq: Four times a day (QID) | INTRAMUSCULAR | Status: DC | PRN
Start: 2016-07-20 — End: 2016-07-20

## 2016-07-20 MED ORDER — LACTATED RINGERS IV SOLN
INTRAVENOUS | Status: DC
Start: 2016-07-20 — End: 2016-07-20
  Administered 2016-07-20: 05:00:00 via INTRAVENOUS

## 2016-07-20 MED ORDER — MIDAZOLAM HCL 2 MG/2ML IJ SOLN
INTRAMUSCULAR | Status: AC
Start: 2016-07-20 — End: 2016-07-20
  Administered 2016-07-20: 1 mg via INTRAVENOUS
  Filled 2016-07-20: qty 2

## 2016-07-20 MED ORDER — FENTANYL CITRATE (PF) 100 MCG/2ML IJ SOLN
INTRAMUSCULAR | Status: AC
Start: 2016-07-20 — End: 2016-07-20
  Administered 2016-07-20: 75 ug via INTRAVENOUS
  Filled 2016-07-20: qty 2

## 2016-07-20 MED ORDER — SODIUM CHLORIDE 0.9 % IV SOLN
Freq: Once | INTRAVENOUS | Status: AC
Start: 2016-07-20 — End: 2016-07-20
  Administered 2016-07-20: 23:00:00 via INTRAVENOUS

## 2016-07-20 MED ORDER — GLYCOPYRROLATE 1 MG/5ML IJ SOLN
INTRAMUSCULAR | Status: DC | PRN
Start: 2016-07-20 — End: 2016-07-20
  Administered 2016-07-20: .6 mg via INTRAVENOUS

## 2016-07-20 MED ORDER — SODIUM CHLORIDE 0.9 % IV SOLN
INTRAVENOUS | Status: DC
Start: 2016-07-20 — End: 2016-07-23
  Administered 2016-07-20: 14:00:00 via INTRAVENOUS
  Administered 2016-07-21: 100 mL/h via INTRAVENOUS
  Administered 2016-07-21 – 2016-07-22 (×4): via INTRAVENOUS

## 2016-07-20 MED ORDER — FAMOTIDINE IN NACL 20 MG/50ML IV SOLN
20.0000 mg | Freq: Two times a day (BID) | INTRAVENOUS | Status: DC
Start: 2016-07-20 — End: 2016-07-21

## 2016-07-20 MED ORDER — ENOXAPARIN SODIUM 30 MG/0.3ML SC SOLN
30.0000 mg | Freq: Two times a day (BID) | SUBCUTANEOUS | Status: DC
Start: 2016-07-21 — End: 2016-07-20

## 2016-07-20 MED ORDER — FENTANYL CITRATE (PF) 100 MCG/2ML IJ SOLN
75.00 ug | Freq: Once | INTRAMUSCULAR | Status: AC
Start: 2016-07-20 — End: 2016-07-20
  Administered 2016-07-20: 75 ug via INTRAVENOUS

## 2016-07-20 MED ORDER — NALOXONE HCL 0.4 MG/ML IJ SOLN
0.1000 mg | INTRAMUSCULAR | Status: DC | PRN
Start: 2016-07-20 — End: 2016-07-20

## 2016-07-20 MED ORDER — ACETAMINOPHEN 10 MG/ML IV SOLN
INTRAVENOUS | Status: DC | PRN
Start: 2016-07-20 — End: 2016-07-20
  Administered 2016-07-20: 1000 mg via INTRAVENOUS

## 2016-07-20 MED ORDER — HYDROMORPHONE HCL 1 MG/ML IJ SOLN
0.5000 mg | INTRAMUSCULAR | Status: DC | PRN
Start: 2016-07-20 — End: 2016-07-20

## 2016-07-20 MED ORDER — MIDAZOLAM HCL 2 MG/2ML IJ SOLN
1.00 mg | Freq: Once | INTRAMUSCULAR | Status: AC
Start: 2016-07-20 — End: 2016-07-20
  Administered 2016-07-20: 1 mg via INTRAVENOUS

## 2016-07-20 MED ORDER — LACTATED RINGERS IV SOLN
INTRAVENOUS | Status: DC
Start: 2016-07-20 — End: 2016-07-20

## 2016-07-20 MED ORDER — LIDOCAINE HCL (CARDIAC) 20 MG/ML IV SOLN
INTRAVENOUS | Status: DC | PRN
Start: 2016-07-20 — End: 2016-07-20
  Administered 2016-07-20: 100 mg via INTRAVENOUS

## 2016-07-20 MED ORDER — SODIUM CHLORIDE 0.9 % IV SOLN
0.0000 ug/kg/h | INTRAVENOUS | Status: DC
Start: 2016-07-20 — End: 2016-07-20
  Administered 2016-07-20: 0.4 ug/kg/h via INTRAVENOUS
  Filled 2016-07-20: qty 4

## 2016-07-20 MED ORDER — LABETALOL HCL 5 MG/ML IV SOLN
5.0000 mg | INTRAVENOUS | Status: DC | PRN
Start: 2016-07-20 — End: 2016-07-20

## 2016-07-20 MED ORDER — ENOXAPARIN SODIUM 40 MG/0.4ML SC SOLN
40.0000 mg | Freq: Two times a day (BID) | SUBCUTANEOUS | Status: DC
Start: 2016-07-21 — End: 2016-07-23
  Administered 2016-07-21 – 2016-07-23 (×5): 40 mg via SUBCUTANEOUS
  Filled 2016-07-20 (×5): qty 1

## 2016-07-20 MED ORDER — FENTANYL CITRATE (PF) 100 MCG/2ML IJ SOLN
50.0000 ug | INTRAMUSCULAR | Status: DC | PRN
Start: 2016-07-20 — End: 2016-07-20

## 2016-07-20 MED ORDER — HYDROMORPHONE HCL 1 MG/ML IJ SOLN
0.5000 mg | INTRAMUSCULAR | Status: DC | PRN
Start: 2016-07-20 — End: 2016-07-21
  Filled 2016-07-20 (×2): qty 0.5

## 2016-07-20 MED ORDER — DIPHENHYDRAMINE HCL 50 MG/ML IJ SOLN
12.5000 mg | Freq: Once | INTRAMUSCULAR | Status: DC | PRN
Start: 2016-07-20 — End: 2016-07-20

## 2016-07-20 MED ORDER — OLANZAPINE ODT 5 MG OR TBDP
2.5000 mg | ORAL_TABLET | Freq: Once | ORAL | Status: DC | PRN
Start: 2016-07-20 — End: 2016-07-20

## 2016-07-20 MED ORDER — MIDAZOLAM HCL 2 MG/2ML IJ SOLN
1.0000 mg | Freq: Two times a day (BID) | INTRAMUSCULAR | Status: DC | PRN
Start: 2016-07-20 — End: 2016-07-20
  Administered 2016-07-20: 1 mg via INTRAVENOUS
  Filled 2016-07-20: qty 2

## 2016-07-20 MED ORDER — HYDROMORPHONE HCL 1 MG/ML IJ SOLN
1.0000 mg | INTRAMUSCULAR | Status: DC | PRN
Start: 2016-07-20 — End: 2016-07-21
  Administered 2016-07-20 – 2016-07-21 (×14): 1 mg via INTRAVENOUS
  Filled 2016-07-20 (×14): qty 1

## 2016-07-20 MED ORDER — NEOSTIGMINE METHYLSULFATE 10 MG/10ML IV SOLN
INTRAVENOUS | Status: DC | PRN
Start: 2016-07-20 — End: 2016-07-20
  Administered 2016-07-20: 4 mg via INTRAVENOUS

## 2016-07-20 MED ORDER — STERILE WATER FOR INJECTION IJ SOLN
1000.0000 mg | Freq: Three times a day (TID) | INTRAMUSCULAR | Status: DC
Start: 2016-07-20 — End: 2016-07-21
  Administered 2016-07-20 – 2016-07-21 (×2): 1000 mg via INTRAVENOUS
  Filled 2016-07-20 (×2): qty 1000

## 2016-07-20 MED ORDER — LACTATED RINGERS IV SOLN
INTRAVENOUS | Status: DC | PRN
Start: 2016-07-20 — End: 2016-07-20
  Administered 2016-07-20: 11:00:00 via INTRAVENOUS

## 2016-07-20 SURGICAL SUPPLY — 49 items
3M STERI-DRAPE 1000 (Drapes/towels) IMPLANT
BANDAGE COBAN 4 LATEX FREE (Misc Medical Supply)
BANDAGE COBAN 6 STERILE LATEX FREE (Misc Medical Supply)
BLADE HELICAL TFN ADVANCE TI 10.35 X 90MM (Orthopedic Nails) ×2 IMPLANT
BLADE SURGEON #15 STERILE (Knives/Blades) IMPLANT
DRAPE 3/4 W/REINFORCEMENT 53" X 77" (Drapes/towels)
DRAPE HALF SHEET MEDIUM (Drapes/towels)
DRAPE IOBAN 2 ANTIMICROBIAL 23" X 17" (Misc Surgical Supply)
DRAPE IRRIGATION POUCH 20" X 23" (Drapes/towels)
DRAPE ORTHO BAR SHEET 100 X 60" (Drapes/towels) ×8 IMPLANT
DRAPE ORTHO U 76" X 120" (Drapes/towels) IMPLANT
DRAPE U IMPERVIOUS SPLIT FILM, 54" X 76" (Drapes/towels) ×2
DRAPE X-RAY C ARM 41" X 74" (Drapes/towels)
DRESSING ABD PAD 8" X 10", STERILE (Dressings/packing) IMPLANT
DRESSING ADAPTIC 3X8 STERILE (Dressings/packing)
DRESSING SPONGE CURITY 4X4 16 PLY (Dressings/packing) IMPLANT
DRILL BIT 4.2MM STERILE (Drills/Bits/Burs/Taps/Reamers) ×2 IMPLANT
FILM IOBAN 2 ANTIMICROBIAL 23" X 17" (Misc Surgical Supply)
GLOVE BIOGEL INDICATOR UNDERGLOVE SIZE 7.5 (Gloves/gowns) ×6 IMPLANT
GLOVE BIOGEL SUPER-SENSITIVE SIZE 7.5 (Gloves/gowns) ×6 IMPLANT
GLOVE SURGEON BIOGEL SIZE 7.5 (Gloves/gowns) ×8
GOWN MICRO COOL LG BLUE, AAMI LVL 4 (Gloves/gowns) ×6 IMPLANT
GOWN SIRUS XLG BLUE, AAMI LVL 4 (Gloves/gowns) ×2 IMPLANT
GUIDE WIRE 3.2MM/400MM (Misc Medical Supply) ×2
NAIL TFN ADVANCE TI CANN 11MM X 380MM 125°, LEFT (Orthopedic Nails) ×2 IMPLANT
OR SET-UP V PACK (Drapes/towels)
PAD GROUND VALLEYLAB REM ADULT E7507 (Misc Surgical Supply)
PAD UNDERCAST WEBRIL 6X4YD (Dressings/packing)
PEROXIDE HYDROGEN 3% 4OZ (Non-Pharmacy Meds/Solutions) IMPLANT
PREP DURAPREP SURGICAL SOLUTION 26 ML (Misc Surgical Supply) ×4 IMPLANT
PREP TRAY WET SKIN SCRUB KENDALL (Misc Surgical Supply)
PROTECTOR ULNAR NERVE PAD, YELLOW (Misc Medical Supply) IMPLANT
ROD REAMING 2.5MM/950MM (Drills/Bits/Burs/Taps/Reamers) ×2 IMPLANT
SCREW IM NAIL TI 5 X 46MM, LOCKING, T25 (Screws/anchors/cables) ×2 IMPLANT
SLEEVE SCD KNEE MEDIUM (Misc Medical Supply) IMPLANT
SOLUTION IRR BAG .9% N/S 3000ML (Non-Pharmacy Meds/Solutions) IMPLANT
SOLUTION IRR POUR BTL 0.9% NS 1000ML (Non-Pharmacy Meds/Solutions)
SOLUTION IRR POUR BTL H20 1000ML (Non-Pharmacy Meds/Solutions) ×4 IMPLANT
SPONGE LAP RF DETECT 18" X 18" XRAY STERILE (Dressings/packing) IMPLANT
STAPLER PROXIMATE SKIN 35 WIDE (Staplers and staple reloads) IMPLANT
SURGICAL PACK BASIC MAJOR SET-UP (Procedure Packs/kits) IMPLANT
SUTURE MONOCRYL PLUS 2-0 27" CP-1 (Suture) ×4 IMPLANT
SUTURE VICRYL PLUS 0 18" CT-1 CR VCP840D (Suture) IMPLANT
TOWELS OR BLUE 4-PACK STERILE, DISPOSABLE (Drapes/towels)
TRAY FOLEY SURESTEP LUBRI-SIL I.C.16FR URIMETER, LF (Lines/Drains) IMPLANT
TUBE YANKAUER FLEXIBLE TIP (Misc Medical Supply) IMPLANT
TUBING CYSTO BLADDER IRRIGATION SET (Tubing/suction) IMPLANT
WEBRIL CAST STERILE 6" X 4" (Dressings/packing) ×2
WEBRIL STERILE 4" (Dressings/packing) ×4

## 2016-07-20 NOTE — Interdisciplinary (Signed)
Pt arrived from trauma bay supine in bed with c-collar on and in full spine precautions on bed with ortho trapeze set up. Pt extremely anxious, continually repeating that she is not crazy, and "please, Jesus, help me!" Pupils round and briskly reactive, R pupil 4 mm, L pupil 3 mm. Pt A&Ox4, but requiring extensive redirection. Pt with normal response in BUE and equal strength. BLE assessment deferred due to unstable pelvis fracture that is unfixed at this time. No ecchymosis or evidence of bleeding in groin/pelvic area. Pt with complete sensation in BLE and peripheral pulses intact. LLE appearing rotated laterally and shortened compared to RLE. Head to toe assessment performed, see Doc Flowsheets. Pt's daughter, Gabriel RungMonique, at bedside and updated on plan of care. 1:1 sitter at bedside with patient. Pt given 1 mg IV dilaudid for 10/10 pain with no effect. Pt given a second 1 mg dose of dilaudid with no effect. Trauma team paged to update on pt's lack of pain control. Awaiting fixation at this time.

## 2016-07-20 NOTE — Anesthesia Postprocedure Evaluation (Signed)
Anesthesia Transfer of Care Note    Patient: Forty Yellowtail    Procedures performed: Procedure(s):  INTRAMEDULLARY RODDING, LEFT FEMUR    Vital signs: stable           Anesthesia Post Note    Patient: Forty [place]tail    Procedure(s) Performed: Procedure(s):  INTRAMEDULLARY RODDING, LEFT FEMUR      Final anesthesia type: No value filed.    Patient location: PACU    Post anesthesia pain: adequate analgesia    Mental status: awake, alert  and oriented    Airway Patent: Yes    Last Vitals:   Vitals:    07/20/16 1400   BP: 103/70   Pulse: 81   Resp: 16   Temp: 36.2 C   SpO2: 100%       Post vital signs: stable    Hydration: adequate    N/V:no    Anesthetic complications: no    Were two or more anti-emetics used?: No, did not administer two or more anti-emetics    Disposal of controlled substances: All controlled substances during the case accounted for and disposed of per hospital policy    Plan of care per primary team.

## 2016-07-20 NOTE — Progress Notes (Signed)
Progress Note  Trauma Intensive Care Unit    Patient Name: 8  MRN: [id number]  Room#: GMW10/UVO53G    Service: Trauma Surgery    Date: 07/20/16  Hospital Day:   1 day - Admitted on: 07/19/2016  Post Injury Day: 1  Post Operative Day(s):  n/a  Mechanism of Injury: Jumped/fall     #  Injury  Management  Date    left subtroch femur fracture Ortho: pending OR today 07/19/16    Rhabdo Q6h CPK and fluid rescus  10/28    Psychosis  Psych consult       SUBJECTIVE: 83F who fell from a balcony while trying to cross to a lower balcony. Repeats "I'm not crazy"    Events/Complaints: s/p Traction by ortho under conscious sedation     OBJECTIVE:     Vitals signs:     Latest Entry  Range (last 24 hours)    Temperature: 97.6 F (36.4 C)  Temp  Avg: 97.6 F (36.4 C)  Min: 97.5 F (36.4 C)  Max: 97.6 F (36.4 C)    Blood pressure (BP): 122/76  BP  Min: 112/77  Max: 136/116    Heart Rate: 82  Pulse  Avg: 77.4  Min: 72  Max: 85    Respirations: 22  Resp  Avg: 18  Min: 10  Max: 28    SpO2: 100 %  SpO2  Avg: 99.6 %  Min: 93 %  Max: 100 %       No Data Recorded       No Data Recorded       No Data Recorded     Weight: 104 kg (229 lb 4.5 oz)  Percentage Weight Change (%): 0 %    Intake/Output       07/19/16 0600 - 07/20/16 0559 07/20/16 0600 - 07/21/16 0559      [id number] 1800-0559 Total 0600-1759 1800-0559 Total       Intake    I.V.  --  502.5 502.5  142.5  -- 142.5    Total Intake -- 502.5 502.5 142.5 -- 142.5       Output    Urine  --  695 695  --  -- --    Total Output -- 695 695 -- -- --       Net I/O     -- -192.5 -192.5 142.5 -- 142.5            Respiratory support:  Oxygen Therapy  SpO2: 100 %  O2 Device: Nasal cannula  ETCO2 (mmHg): 32 mmHg  O2 Flow Rate (L/min): 2 l/min       No results for input(s): ARTPH, ARTPCO2, ARTPO2, ARTHC03, ARTBE, ARTO2SAT in the last 72 hours.    Physical Exam:  General Appearance: Adult woman in significant acute distress, intermittently screaming   Neuro: GCS 13 (E4, V4, M5), A&Ox1,  motor grossly intact in all extremities.   Eyes: Conjunctivae and corneas clear. Pupils reactive (4 to 2 mm on R, 3 to 1 mm on L). EOM's intact., swelling and ecchymosis to R eye  Ears: Hearing grossly intact; no frank blood or hemotympanum  Nose: 20m lac to nasal bridge  Mouth: Airway intact; atraumatic; no malocclusion  Neck: Unable to assess cspine tenderness, no step offs or deformities   Head: No cephalohematoma or deformities.  Back: Diffuse T spine, no L-spine tenderness, no step-offs or deformities.    Heart: RRR  Lungs: Clear to auscultation bilaterally; no chest wall tenderness  to palpation, abrasion to L breast  Abdomen: Soft, non-tender, non-distended, no rebound/guarding, abrasions with ecchymoses    Pelvis: Stable to compression; tender to palpation  Rectal: Good tone, no gross blood.  MSK: Motor strength grossly intact in all extremities. 2+ peripheral pulses.                         LUE: abrasions posterior aspect L elbow                        LLE: Severe pain in L hip old abrasion anterior aspect L calf, ecchymosis L knee, 2+ pulses, deformity with external rotation at L hip                        RLE: atraumatic, no pain in R hip                        RUE: ecchymoses R shoulder, pain in R elbow  Skin: No lacerations, no abrasions.    Labs:  CBC  Recent Labs      07/19/16   1925  07/20/16   0225   WBC  14.7*  9.1   HGB  12.0  10.1   HCT  36.3  31.9   PLT  247  239   SEG   --   89  89   LYMPHS   --   5  5   MONOS   --   4  4        Chemistry  Recent Labs      07/19/16   1925  07/20/16   0125   NA  143  144   K  3.8  3.5   CL  102  106   BICARB  23  25   BUN  20  18   CREAT  1.21  1.04   GLU  102*  102*   [place]  9.6  8.4   MG  2.4*   --    PHOS  2.2*   --      No results for input(s): ALK, AST, ALT, TBILI, DBILI, ALB in the last 72 hours.       Coags  Recent Labs      07/19/16   1925   PT  11.8   PTT  30.3   INR  1.1       Toxicology:  Lab Results   Component Value Date    ETOH <11 07/19/2016     BARBCLASS Negative 07/20/2016    BENZDIAZCL Pending confirm 07/20/2016    BENZYLCGNN Negative 07/20/2016    METHADONE Negative 07/20/2016    OPIATESCL Negative 07/20/2016    OXY Negative 07/20/2016    PHENCYCLDN Negative 07/20/2016    TETCANNABIN Pending confirm 07/20/2016       Radiology:   X-ray Shoulder Complete Min 2 Views - Left    Result Date: 07/20/2016  IMPRESSION: No evidence of acute fracture. Widening of the acromioclavicular joint which measures 11 mm, concerning for acromioclavicular joint injury. Probable hydroxyapatite crystal deposition in the rotator cuff.     X-ray Elbow 2 Views - Right     IMPRESSION: Question small amount of subcutaneous emphysema at the antecubital fossa and please correlate physical exam findings and for possible soft tissue injury in  this region.  No acute fracture or dislocation.    X-ray  Knee 1 Or 2 Views - Left    Result Date: 07/19/2016   IMPRESSION: No acute osseous abnormality of the left knee    X-ray Tibia & Fibula 2 Views - Left    Result Date: 07/19/2016   IMPRESSION: No acute osseous abnormality of the left tibia and fibula.    X-ray Hip Unilateral With Pelvis 1 View - Left    Result Date: 07/20/2016   IMPRESSION: Markedly improved alignment of the displaced left proximal femoral subtrochanteric comminuted fracture. Interval placement of traction pin through the distal femoral shaft.     X-ray Hip Unilateral With Pelvis 2 Or 3 Views - Left    Result Date: 07/19/2016  IMPRESSION: Re-demonstration of a markedly displaced left proximal femoral comminuted fracture, centered at the subtrochanteric region, with greater than a full shaft width lateral displacement of the distal fracture fragment. Background trauma board artifact.  I have reviewed the images and agree with the resident interpretation.    X-ray Femur Minimum 2 Views - Left    Result Date: 07/20/2016   IMPRESSION: Markedly improved alignment of the displaced left proximal femoral subtrochanteric comminuted  fracture. Interval placement of traction pin through the distal femoral shaft.    X-ray Femur Minimum 2 Views - Left    Result Date: 07/19/2016   IMPRESSION: Re-demonstration of a markedly displaced left proximal femoral comminuted fracture, centered at the subtrochanteric region, with greater than a full shaft width displacement of the distal fracture fragment. Please refer to concurrently reported left hip and left knee radiographs for additional findings and impression.   Ct Head W/o Contrast    Result Date: 07/19/2016   IMPRESSION: 1. No evidence of acute intracranial abnormality, including no intracranial hemorrhage, extra-axial fluid collection, midline shift, or hydrocephalus. 2. No evidence of a calvarial or skullbase fracture.     X-ray Chest Single View Frontal    Result Date: 07/19/2016   IMPRESSION: No radiographic evidence of acute pulmonary abnormality.  I have reviewed the images and agree with the resident interpretation.    X-ray Thoracic Spine 2 Views    Result Date: 07/19/2016   IMPRESSION: No radiographic evidence of an acute osseous abnormality the thoracic and lumbar spine.     Ct C-spine W/o Contrast    Result Date: 07/19/2016   IMPRESSION: 1. No acute osseous findings of the cervical spine. 2. Mild degenerative changes of the cervical spine, as described above.     X-ray Pelvis 1 Or 2 Views    . IMPRESSION: 1. Markedly displaced left proximal femoral comminuted fracture, centered at the subtrochanteric region, with greater than a full shaft width displacement of the distal fracture fragment.  Critical Results:      US Abdomen Limited     IMPRESSION: No sonographic evidence of gross abdominal injury.     Medications:  Scheduled Meds  . docusate sodium  250 mg Nightly   . senna  2 tablet QAM     PRN Meds  . acetaminophen  650 mg Q4H PRN   . HYDROmorphone  0.5 mg Q1H PRN   . HYDROmorphone  0.5 mg Q1H PRN   . HYDROmorphone  1 mg Q1H PRN   . nalOXone  0.1 mg Q2 Min PRN   . OLANZapine  2.5 mg Once  PRN     IV Meds  . lactated ringers 150 mL/hr at 07/20/16 0600       Allergies:  Allergies   Allergen Reactions   . Haldol [Haloperidol] Swelling   .  Reglan [Metoclopramide] Unspecified   . Tramadol Unspecified       Smoking History:    has no tobacco history on file.    Lines, Drains, and Airways:  CVC Triple Lumen - Right Femoral (Active)   Site Assessment Phlebitis 0 07/19/2016 11:30 PM   Proximal Lumen Status Flushed;Infusing;Patent 07/19/2016 11:30 PM   Medial Lumen Status Blood returned;Flushed;Patent;Saline Locked 07/19/2016 11:30 PM   Distal Lumen Status Blood returned;Flushed;Patent;Saline Locked 07/19/2016 11:30 PM   Dressing Transparent;Antimicrobial patch 07/19/2016 11:30 PM   Dressing Status Clean, dry, intact 07/19/2016 11:30 PM   Dressing Change Due 07/20/16 07/19/2016 11:30 PM   Securement/safety measures Secured from movement 07/19/2016 11:30 PM   Line evaluated for d/c? Yes, line use still indicated. 07/19/2016 11:30 PM       Line Insertion Date Rewire # Day #                             Patient safety issues addressed:     Feedings: no  Analgesia: yes  Sedation: no  Thromboprophylaxis: no  Head-of-bed elevation: yes  Ulcer prophylaxis: yes  Glycemic control: no  Spontaneous breathing trial: no  Bowel care: no  Indwelling catheter removal: no  De-escalation of antibiotics: no    Foley catheterization: no  Restraints: no  Family discussion: yes    ASSESSMENT /PLAN:    (1) CV: known HTN, will restart meds after OR     (2) Pulmonary: sat 98% on RA, no issues     (3) FEN: replace PRN     (4 Neuro: pain well controlled with dPCA.   C-spine collar on because of tenderness.     (5) Hem: stable. Daily cbc     (6) Renal: no issues. Crea within normal limits     (7) GI: NPO for procedure today     (8) Endocrine: no issues     (9) ID: afebrile. Will culture if > 101.5     (10) Pain: dPCA     (11) OT/PT: will re assess after OR     (12) Psych: psychosis, pending recs     (13) Consults: osych, ortho, SICU          Discharge Plan: pending improvement . Continue ICU level of care today     Mickle Mallory, MD

## 2016-07-20 NOTE — Progress Notes (Signed)
Moderate sedation given for placement of femur traction pin. Consent provided by her daughter.  Gave 75mcg of Fentanyl and 1mg  of versed, She is easily aroused through the procedure and communicating, intermittently excited but calms with affirmation. No oxygen desaturations, HD nl. Will maintain in the ICU on ETCO2.     Dennie MaizesLaura N Jayel Scaduto

## 2016-07-20 NOTE — Brief Op Note (Signed)
BRIEF OPERATIVE NOTE    DATE: 07/20/2016  TIME: 12:50 PM    PREOPERATIVE DIAGNOSIS: left intertrochanteric fracture    POSTOPERATIVE DIAGNOSIS: same    PROCEDURE INFORMATION:  Procedure(s):  INTRAMEDULLARY RODDING, FEMUR    ATTENDING SURGEON:   Surgeon(s) and Role:     * Elaina PatteeSchwartz, Khrista Braun Kay, MD - Primary    ANESTHESIA: GET    FINDINGS: low left intertrochanteric fracture,  Varus laxity of left knee    SPECIMENS: none    Fluids/Blood Products:      IV Fluids: 1.5 L crystalloid, 500 ml albumin    Blood Products: none    EBL: 200 mL    Urine Output: per anesth record    COMPLICATIONS: none    DISPOSITION:   NWB LLE for now - need CT of L knee to assess for tibial plateau fracture, if negative will advance to wbat  2 doses of ancef 2 gm q8 hrs  4 weeks of lovenox  PT consult  Ortho will follow, dressing change 2 days postpop

## 2016-07-20 NOTE — Progress Notes (Signed)
[facility] Progress Note on 10:32 PM     HPI:    81F w left low IT femur fx s/p IMN on 10/29.     Events:  No acute events.    Subjective:   Patient constantly agitated, screaming in pain, cursing, even when not being examined.     Objective:   Vital Signs:   Temperature:  [97 F (36.1 C)-98.7 F (37.1 C)] 98.4 F (36.9 C) (10/29 2000)  Blood pressure (BP): (86-136)/(44-116) 86/44 (10/29 2200)  Heart Rate:  [66-90] 73 (10/29 2200)  Respirations:  [9-28] 13 (10/29 2200)  Pain Score: 10 (10/29 2028)  O2 Device: None (Room air) (10/29 2000)  O2 Flow Rate (L/min):  [2 l/min-6 l/min] 2 l/min (10/29 1600)  SpO2:  [93 %-100 %] 97 % (10/29 2200)  Weights (last 14 days)     Date/Time Weight Who    07/20/16 0505 104 kg (229 lb 4.5 oz) CH              Intake/Output Summary (Last 24 hours) at 07/20/16 2232  Last data filed at 07/20/16 2100   Gross per 24 hour   Intake             5390 ml   Output             1160 ml   Net             4230 ml       Physical Exam:   General: Well appearing, no acute distress, AAOx3  LLE  Skin:  dressing c/d/i  Sensation: SILT s/s/sp/dp/t nerves  Motor: fires EHL/TA/GS/FHL  Peripheral vascular: palpable DP, foot wwp, BCR          Laboratory Data:  Results for orders placed or performed during the hospital encounter of 07/19/16   BASIC METABOLIC PANEL, BLOOD   Result Value Ref Range    Glucose 102 (H) 70 - 99 mg/dL    BUN 20 8 - 23 mg/dL    Creatinine 9.141.21 mg/dL    GFR 53 mL/min    Sodium 143 136 - 145 mmol/L    Potassium 3.8 3.5 - 5.1 mmol/L    Chloride 102 98 - 107 mmol/L    Bicarbonate 23 22 - 29 mmol/L    Anion Gap 18 (H) 7 - 15 mmol/L    Calcium 9.6 8.2 - 9.6 mg/dL   CKMB+INDEX (NO CPK) PANEL, BLOOD   Result Value Ref Range    CK-MB 22.0 ng/mL    CK-MB Index 0.2 %   CPK-CREATINE PHOSPHOKINASE, BLOOD   Result Value Ref Range    CPK 11820 (HH) 0 - 175 U/L   ALCOHOL, BLOOD   Result Value Ref Range    Alcohol <11 None mg/dL   PHOSPHORUS, BLOOD   Result Value Ref Range    Phosphorous 2.2 (L) 2.7 - 4.5 mg/dL   HEMOGRAM, BLOOD   Result Value Ref Range    WBC 14.7 (H) 4.0 - 10.0 1000/mm3    RBC 4.49 mill/mm3    Hgb 12.0 gm/dL    Hct 78.236.3 %    MCV 95.680.8 79.0 - 95.0 um3    MCH 26.7 26.0 - 32.0 pgm    MCHC 33.1 32.0 - 36.0 g/dL    RDW 21.318.2 (H) 08.612.0 - 14.0 %    MPV 9.2 (L) 9.4 - 12.4 fL    Plt Count 247 140 - 370 1000/mm3   TROPONIN T, BLOOD  Result Value Ref Range    Troponin T 0.07 <0.01 ng/mL   APTT, BLOOD   Result Value Ref Range    PTT 30.3 25.0 - 34.0 sec   PROTHROMBIN TIME, BLOOD   Result Value Ref Range    PT,Patient 11.8 9.7 - 12.5 sec    INR 1.1    MAGNESIUM, BLOOD   Result Value Ref Range    Magnesium 2.4 (H) 1.7 - 2.3 mg/dL   MVH+Q4ONG+E9BMW   Result Value Ref Range    Ven Coll Site Venous     Temp, Ven 36.4 'C    FIO2, Ven 21.0 %    pH, Ven (T) 7.47 (H) 7.33 - 7.40    pCO2, Ven (T) 35 (L) 40 - 52 mmHg    pO2, Ven (T) 26 25 - 44 mmHg    HCO3, Ven 26 mmol/L    BE, Ven 1.9 mmol/L    O2 Sat, Ven 56.5 %    O2 Hgb, Ven 54.9 40.0 - 70.0    pH, Ven (Uncorr) 7.46 (H) 7.33 - 7.40    pCO2, Ven (Uncorr) 36 (L) 40 - 52 mmHg    pO2, Ven (Uncorr) 27 25 - 44 mmHg   BLOOD GAS HGB/HCT, VENOUS   Result Value Ref Range    Hgb, Ven 11.1 g/dL    Hct (Est), Ven 33 %   BASIC METABOLIC PANEL, BLOOD   Result Value Ref Range    Glucose 102 (H) 70 - 99 mg/dL    BUN 18 8 - 23 mg/dL    Creatinine 4.13 mg/dL    GFR >24 mL/min    Sodium 144 136 - 145 mmol/L    Potassium 3.5 3.5 - 5.1 mmol/L    Chloride 106 98 - 107 mmol/L    Bicarbonate 25 22 - 29 mmol/L    Anion Gap 13 7 - 15 mmol/L    Calcium 8.4 8.2 - 9.6 mg/dL   URINE IMMUNOASSAY DRUG SCREEN   Result Value Ref Range    Amphetamines Screen Pending confirm Negative    Barbiturates Screen Negative Negative    Cocaine Screen Negative Negative    Benzodiazepine Screen Pending confirm Negative    Methadone Screen Negative Negative    Opiates Screen Negative Negative    Oxycodone Screen Negative Negative    Phencyclidine Screen Negative Negative    THC Screen  Pending confirm Negative    UR Drug Screen Interpretation See Comment Negative   CPK-CREATINE PHOSPHOKINASE, BLOOD   Result Value Ref Range    CPK 11336 (HH) 0 - 175 U/L   CKMB+INDEX (NO CPK) PANEL, BLOOD   Result Value Ref Range    CK-MB 25.0 ng/mL    CK-MB Index 0.2 %   TROPONIN T, BLOOD   Result Value Ref Range    Troponin T <0.01 <0.01 ng/mL   CBC WITH ADIFF, BLOOD   Result Value Ref Range    WBC 9.1 4.0 - 10.0 1000/mm3    RBC 3.90 mill/mm3    Hgb 10.1 gm/dL    Hct 40.1 %    MCV 02.7 79.0 - 95.0 um3    MCH 25.9 (L) 26.0 - 32.0 pgm    MCHC 31.7 (L) 32.0 - 36.0 g/dL    RDW 25.3 (H) 66.4 - 14.0 %    MPV 9.5 9.4 - 12.4 fL    Plt Count 239 140 - 370 1000/mm3    Segs 89 %    Lymphocytes 5 %  Monocytes 4 %    Eosinophils 2 %    ANC-Manual Mode 8.1 (H) 1.6 - 7.0 1000/mm3    Abs Lymphs 0.5 (L) 0.8 - 3.1 1000/mm3    Abs Monos 0.4 0.2 - 0.8 1000/mm3    Abs Eosinophils 0.2 <0.1 - 0.5 1000/mm3    Diff Type Manual    MDIFF   Result Value Ref Range    Segs 89 %    Lymphocytes 5 %    Monocytes 4 %    Eosinophils 2 %    Number of Cells Counted 117     Diff Type Manual     ANC-Manual Mode 8.1 (H) 1.6 - 7.0 1000/mm3    Abs Lymphs 0.5 (L) 0.8 - 3.1 1000/mm3    Abs Monos 0.4 0.2 - 0.8 1000/mm3    Abs Eosinophils 0.2 <0.1 - 0.5 1000/mm3    Plt Est Adequate     RBC Comment Normal     Polychromasia 1+     Ovalocytes 1+     Burr Cell 1+     Hypochromia 1+     Target Cells 1+    GLUCOSE (POCT)   Result Value Ref Range    Glucose (POCT) 115 (H) 70 - 99 mg/dL   CPK-CREATINE PHOSPHOKINASE, BLOOD   Result Value Ref Range    CPK 12397 (HH) 0 - 175 U/L   PHOSPHORUS, BLOOD   Result Value Ref Range    Phosphorous 3.6 2.7 - 4.5 mg/dL   MAGNESIUM, BLOOD   Result Value Ref Range    Magnesium 2.2 1.7 - 2.3 mg/dL   TSH, BLOOD   Result Value Ref Range    TSH 2.58 0.27 - 4.20 uIU/mL   BASIC METABOLIC PANEL, BLOOD   Result Value Ref Range    Glucose 110 (H) 70 - 99 mg/dL    BUN 19 8 - 23 mg/dL    Creatinine 5.40 mg/dL    GFR >98 mL/min    Sodium 146  (H) 136 - 145 mmol/L    Potassium 3.5 3.5 - 5.1 mmol/L    Chloride 107 98 - 107 mmol/L    Bicarbonate 25 22 - 29 mmol/L    Anion Gap 14 7 - 15 mmol/L    Calcium 8.4 8.2 - 9.6 mg/dL   GLUCOSE (POCT)   Result Value Ref Range    Glucose (POCT) 101 (H) 70 - 99 mg/dL   TYPE & SCREEN   Result Value Ref Range    ABO/RH B POS     Antibody Screen NEG    ABO/RH CONFIRMATION   Result Value Ref Range    ABO/RH B POS    VBG+O2HBV+O2S+O2CNV   Result Value Ref Range    Ven Coll Site Venous     Temp, Ven 35.1 'C    FIO2, Ven 100.0 %    pH, Ven (T) 7.40 7.33 - 7.40    pCO2, Ven (T) 39 (L) 40 - 52 mmHg    pO2, Ven (T) 52 (H) 25 - 44 mmHg    HCO3, Ven 24 mmol/L    BE, Ven -0.9 mmol/L    O2 Sat, Ven 91.5 %    O2 Hgb, Ven 88.3 (H) 40.0 - 70.0    O2 Content, Ven 9.6 vol %    pH, Ven (Uncorr) 7.37 7.33 - 7.40    pCO2, Ven (Uncorr) 42 40 - 52 mmHg    pO2, Ven (Uncorr) 59 (H) 25 - 44 mmHg  Hgb, Ven 7.7 g/dL    Hct (Est), Ven 23 %   SODIUM, VENOUS BLOOD GAS   Result Value Ref Range    Sodium, Ven 140 135 - 145 mmol/L   POTASSIUM, VENOUS BLOOD GAS   Result Value Ref Range    Potassium, Ven 3.4 (L) 3.5 - 5.0 mmol/L   CALCIUM, IONIZED, VENOUS BLOOD GAS   Result Value Ref Range    Calcium, Ven 1.18 1.13 - 1.32 mmol/L   GLUCOSE, VENOUS BLOOD GAS   Result Value Ref Range    Glucose, Ven 71 65 - 110 mg/dL   BASIC METABOLIC PANEL, BLOOD   Result Value Ref Range    Glucose 122 (H) 70 - 99 mg/dL    BUN 16 8 - 23 mg/dL    Creatinine 8.650.95 mg/dL    GFR >78>60 mL/min    Sodium 144 136 - 145 mmol/L    Potassium 3.8 3.5 - 5.1 mmol/L    Chloride 108 (H) 98 - 107 mmol/L    Bicarbonate 25 22 - 29 mmol/L    Anion Gap 11 7 - 15 mmol/L    Calcium 8.3 8.2 - 9.6 mg/dL   CPK-CREATINE PHOSPHOKINASE, BLOOD   Result Value Ref Range    CPK 11362 (HH) 0 - 175 U/L   GLUCOSE (POCT)   Result Value Ref Range    Glucose (POCT) 132 (H) 70 - 99 mg/dL   BASIC METABOLIC PANEL, BLOOD   Result Value Ref Range    Glucose 109 (H) 70 - 99 mg/dL    BUN 13 8 - 23 mg/dL    Creatinine 4.690.89  mg/dL    GFR >62>60 mL/min    Sodium 142 136 - 145 mmol/L    Potassium 4.1 3.5 - 5.1 mmol/L    Chloride 105 98 - 107 mmol/L    Bicarbonate 29 22 - 29 mmol/L    Anion Gap 8 7 - 15 mmol/L    Calcium 7.9 (L) 8.2 - 9.6 mg/dL   CPK-CREATINE PHOSPHOKINASE, BLOOD   Result Value Ref Range    CPK 11306 (HH) 0 - 175 U/L     Lab Results   Component Value Date    WBC 9.1 07/20/2016    HGB 10.1 07/20/2016    HCT 31.9 07/20/2016    PLT 239 07/20/2016     Lab Results   Component Value Date    NA 142 07/20/2016    K 4.1 07/20/2016    CL 105 07/20/2016    BICARB 29 07/20/2016    BUN 13 07/20/2016    CREAT 0.89 07/20/2016    GLU 109 07/20/2016    [place] 7.9 07/20/2016     Lab Results   Component Value Date    INR 1.1 07/19/2016    PTT 30.3 07/19/2016         Assessment: 22F w low left IT femur fx s/p IMN on 10/29 doing well post-operatively.     Plan:    NWB LLE for now - need CT of L knee to assess for tibial plateau fracture, if negative will advance to wbat  2 doses of ancef 2 gm q8 hrs  4 weeks of lovenox  PT consult  Ortho will follow, dressing change 2 days postpop      Leanne ChangPreet Saphire Barnhart, MD   Orthopaedic Surgery Resident, PGY-2  Pager #: [phone]

## 2016-07-20 NOTE — Anesthesia Preprocedure Evaluation (Signed)
ANESTHESIA PRE-OPERATIVE EVALUATION    Patient Information    Name: 37    MRN: [id number]    DOB: 09/22/1900    Age: 60 year old    Sex: unknown  Procedure(s):  INTRAMEDULLARY RODDING, FEMUR      BP 119/79  Pulse 75  Temp 37.1 C  Resp 15  Ht 5' 5" (1.651 m)  Wt 104 kg (229 lb 4.5 oz)  SpO2 99%  BMI 38.15 kg/m2        Primary language spoken:  Data Unavailable    ROS/Medical History:      General Review & History of Anesthetic Complications:    - negative ROS    60yo female w/ PMHx of HTN and psych who presents s\/p fall from balcony. Scheduled for INTRAMEDULLARY RODDING, FEMUR (Left Leg Upper) Cardiovascular:    Exercise tolerance: >4 METS  (+)    hypertension           Pulmonary:  - negative ROS    Hematology/Oncology:      Neuro/Psych:          (-) seizures    ROS Comments: Psych hx - possible SI w/ attempt   Infectious Disease:            Endo/Other:     GI/Hepatic:  - negative ROS    Renal:    - Negative ROS        Pregnancy History:             Pre Anesthesia Testing (PCC/CPC) notes/comments:                   Physical Exam    Airway:  Inter-inciser distance 3-4 cm  Prognanth Able    Mallampati: II  Neck ROM: limited  TM distance: 5-6 cm  Short thick neck: Yes    cervical spine instability    Cardiovascular:      Pulmonary:      Neuro/Neck/Skeletal/Skin:      Dental:        Abdominal:              Last  OSA (STOP BANG) Score:  No Data Recorded    Last OSA  (STOP) Score for   No Data Recorded                 No past medical history on file.  No past surgical history on file.  Social History   Substance Use Topics   . Smoking status: Not on file   . Smokeless tobacco: Not on file   . Alcohol use Not on file       Current Facility-Administered Medications   Medication Dose Route Frequency Provider Last Rate Last Dose   . [MAR Hold] acetaminophen (TYLENOL) tablet 650 mg  650 mg Oral Q4H PRN Elvera Lennox, MD       . Doug Sou Hold] ceFAZolin (ANCEF) 1,000 mg in sterile water (PF) 10 mL IV  1,000  mg IntraVENOUS Q8H Erin Fulling, MD       . Doug Sou Hold] docusate sodium (COLACE) capsule 250 mg  250 mg Oral Nightly Elvera Lennox, MD       . Doug Sou Hold] HYDROmorphone (DILAUDID) injection 0.5 mg  0.5 mg IntraVENOUS Q1H PRN [place], Elvin So, MD       . Doug Sou Hold] HYDROmorphone (DILAUDID) injection 0.5 mg  0.5 mg IntraVENOUS Q1H PRN [place], Elvin So, MD       . [facility] Hold]  HYDROmorphone (DILAUDID) injection 1 mg  1 mg IntraVENOUS Q1H PRN Lake, Charissa Marie, MD   1 mg at 07/20/16 0853   . lactated ringers 500 mL IV bolus   IntraVENOUS Once Berndtson, Allison E, MD 500 mL/hr at 07/20/16 1014     . [MAR Hold] nalOXone (NARCAN) injection 0.1 mg  0.1 mg IntraVENOUS Q2 Min PRN Lee, Arielle Marian, MD       . [MAR Hold] OLANZapine (ZYPREXA ZYDIS) disintegrating tablet 2.5 mg  2.5 mg Oral Once PRN Lake, Charissa Marie, MD       . [MAR Hold] senna (SENOKOT) tablet 17.2 mg  2 tablet Oral QAM Lee, Arielle Marian, MD       . sodium chloride 0.9% infusion   IntraVENOUS Continuous Alkhuziem, Maha Mohammad, MD         Allergies   Allergen Reactions   . Haldol [Haloperidol] Swelling   . Reglan [Metoclopramide] Unspecified   . Tramadol Unspecified       Labs and Other Data  Lab Results   Component Value Date    NA 146 07/20/2016    K 3.5 07/20/2016    CL 107 07/20/2016    BICARB 25 07/20/2016    BUN 19 07/20/2016    CREAT 1.01 07/20/2016    GLU 110 07/20/2016    CA 8.4 07/20/2016     No results found for: AST, ALT, GGT, LDH, ALK, TP, ALB, TBILI, DBILI  Lab Results   Component Value Date    WBC 9.1 07/20/2016    RBC 3.90 07/20/2016    HGB 10.1 07/20/2016    HCT 31.9 07/20/2016    MCV 81.8 07/20/2016    MCHC 31.7 07/20/2016    RDW 18.3 07/20/2016    PLT 239 07/20/2016    MPV 9.5 07/20/2016    SEG 89 07/20/2016    SEG 89 07/20/2016    LYMPHS 5 07/20/2016    LYMPHS 5 07/20/2016    MONOS 4 07/20/2016    MONOS 4 07/20/2016    EOS 2 07/20/2016    EOS 2 07/20/2016     Lab Results   Component Value Date    INR 1.1  07/19/2016    PTT 30.3 07/19/2016     No results found for: ARTPH, ARTPO2, ARTPCO2    Anesthesia Plan:  Risks and Benefits of Anesthesia      I have prescribed the anesthetic plan:         Planned anesthesia method: General         ASA 3 (Severe systemic disease)     Potential anesthesia problems identified and risks including but not limited to the following were discussed with patient and/or patient's representative: Dental injury or sore throat    Planned monitoring method: Routine monitoring    Informed Consent:  Anesthetic plan and risks discussed with Patient and Other.    Plan discussed with Resident, Attending and Surgeon.

## 2016-07-20 NOTE — Progress Notes (Addendum)
[facility] Progress Note on 8:27 AM     HPI:  Unknown age adult woman who reportedly fell from a balcony of unknown height with left leg deformity.  Patient presents with altered mental status, combative and screaming and not currently participating with examination or question.      Events:  No acute events overnight.     Subjective:   In significant amount of pain from LLE.     Objective:   Vital Signs:   Temperature:  [97.5 F (36.4 C)-97.6 F (36.4 C)] 97.6 F (36.4 C) (10/29 0000)  Blood pressure (BP): (106-136)/(65-116) 106/65 (10/29 0800)  Heart Rate:  [72-85] 73 (10/29 0800)  Respirations:  [10-28] 12 (10/29 0800)  Pain Score: 9 (10/29 0746)  O2 Device: Nasal cannula (10/29 0300)  O2 Flow Rate (L/min):  [2 l/min] 2 l/min (10/29 0300)  SpO2:  [93 %-100 %] 98 % (10/29 0800)  Weights (last 14 days)     Date/Time Weight Who    07/20/16 0505 104 kg (229 lb 4.5 oz) CH              Intake/Output Summary (Last 24 hours) at 07/20/16 0827  Last data filed at 07/20/16 0600   Gross per 24 hour   Intake              645 ml   Output              730 ml   Net              -85 ml       Tertiary Physical Exam:  BP 106/65  Pulse 73  Temp 97.6 F (36.4 C)  Resp 12  Ht 5\' 5"  (1.651 m)  Wt 104 kg (229 lb 4.5 oz)  SpO2 98%  BMI 38.15 kg/m2    General: patient awake, alert, and responding to commands; no apparent distress  HEENT: hearing and vision grossly intact  Cardio: regular rate and rhythm per peripheral pulses  Respiratory: patient breathing quietly without use of accessory muscles  Abd: soft, NT, ND  Neuro: grossly intact; see below for detailed motor/sensory    Spine: no TTP or step-offs noted at cervical, thoracic, or lumbar spine    Right Upper Extremity:   - Skin: atraumatic, no abrasions, lacerations, skin puckering, or poke holes  - No gross deformity or TTP at shoulder, arm, elbow, forearm, wrist, or hand  - Full, pain free ROM at shoulder, elbow, wrist, and digits  - Motor:     -  extends index finger    - forms fist   - Sensory: intact to light touch in axillary, LABC, radial, median, and ulnar distributions  - Vascular: radial pulse 2+; CR < 2s    Left Upper Extremity:   - Skin: ecchymosis over left shoulder   - No gross deformity or TTP at shoulder, arm, elbow, forearm, wrist, or hand  - Motor:     Able to make a fist, rest of exam limited by pain  - Sensory: intact to light touch in axillary, LABC, radial, median, and ulnar distributions  - Vascular: radial pulse 2+; CR < 2s    Right Lower Extremity:   - Skin: atraumatic, no abrasions, lacerations, skin puckering, or poke holes  - Swelling of R knee  - Motor:      Iliopsoas  0/5   Hamstrings  0/5   Quadriceps  0/5   TA   fires  GSC   fires   EHL   fires   FHL   fires  - Sensory: intact to light touch in sural, saphenous, superficial peroneal, deep peroneal, and tibial distributions  - Vascular: DP +2    Left Lower Extremity:  - Skin: traction pin in place  - Motor:      Iliopsoas  0/5   Hamstrings  0/5   Quadriceps  0/5   TA   0/5   GSC   0/5   EHL   0/5   FHL   0/5  - Sensory: intact to light touch in sural, saphenous, superficial peroneal, deep peroneal, and tibial distributions  - Vascular: DP 2+    Pelvis: no tenderness on AP or rotational stress of pelvis; no TTP at symphysis               Laboratory Data:  Results for orders placed or performed during the hospital encounter of 07/19/16   BASIC METABOLIC PANEL, BLOOD   Result Value Ref Range    Glucose 102 (H) 70 - 99 mg/dL    BUN 20 8 - 23 mg/dL    Creatinine 1.611.21 mg/dL    GFR 53 mL/min    Sodium 143 136 - 145 mmol/L    Potassium 3.8 3.5 - 5.1 mmol/L    Chloride 102 98 - 107 mmol/L    Bicarbonate 23 22 - 29 mmol/L    Anion Gap 18 (H) 7 - 15 mmol/L    Calcium 9.6 8.2 - 9.6 mg/dL   CKMB+INDEX (NO CPK) PANEL, BLOOD   Result Value Ref Range    CK-MB 22.0 ng/mL    CK-MB Index 0.2 %   CPK-CREATINE PHOSPHOKINASE, BLOOD   Result Value Ref Range    CPK 11820 (HH) 0 - 175 U/L   ALCOHOL,  BLOOD   Result Value Ref Range    Alcohol <11 None mg/dL   PHOSPHORUS, BLOOD   Result Value Ref Range    Phosphorous 2.2 (L) 2.7 - 4.5 mg/dL   HEMOGRAM, BLOOD   Result Value Ref Range    WBC 14.7 (H) 4.0 - 10.0 1000/mm3    RBC 4.49 mill/mm3    Hgb 12.0 gm/dL    Hct 09.636.3 %    MCV 04.580.8 79.0 - 95.0 um3    MCH 26.7 26.0 - 32.0 pgm    MCHC 33.1 32.0 - 36.0 g/dL    RDW 40.918.2 (H) 81.112.0 - 14.0 %    MPV 9.2 (L) 9.4 - 12.4 fL    Plt Count 247 140 - 370 1000/mm3   TROPONIN T, BLOOD   Result Value Ref Range    Troponin T 0.07 <0.01 ng/mL   APTT, BLOOD   Result Value Ref Range    PTT 30.3 25.0 - 34.0 sec   PROTHROMBIN TIME, BLOOD   Result Value Ref Range    PT,Patient 11.8 9.7 - 12.5 sec    INR 1.1    MAGNESIUM, BLOOD   Result Value Ref Range    Magnesium 2.4 (H) 1.7 - 2.3 mg/dL   [id number]+[id number]+O2SAT+O2HBV   Result Value Ref Range    Ven Coll Site Venous     Temp, Ven 36.4 'C    FIO2, Ven 21.0 %    pH, Ven (T) 7.47 (H) 7.33 - 7.40    pCO2, Ven (T) 35 (L) 40 - 52 mmHg    pO2, Ven (T) 26 25 - 44 mmHg    HCO3, Ven 26 mmol/L  BE, Ven 1.9 mmol/L    O2 Sat, Ven 56.5 %    O2 Hgb, Ven 54.9 40.0 - 70.0    pH, Ven (Uncorr) 7.46 (H) 7.33 - 7.40    pCO2, Ven (Uncorr) 36 (L) 40 - 52 mmHg    pO2, Ven (Uncorr) 27 25 - 44 mmHg   BLOOD GAS HGB/HCT, VENOUS   Result Value Ref Range    Hgb, Ven 11.1 g/dL    Hct (Est), Ven 33 %   BASIC METABOLIC PANEL, BLOOD   Result Value Ref Range    Glucose 102 (H) 70 - 99 mg/dL    BUN 18 8 - 23 mg/dL    Creatinine 1.61 mg/dL    GFR >09 mL/min    Sodium 144 136 - 145 mmol/L    Potassium 3.5 3.5 - 5.1 mmol/L    Chloride 106 98 - 107 mmol/L    Bicarbonate 25 22 - 29 mmol/L    Anion Gap 13 7 - 15 mmol/L    Calcium 8.4 8.2 - 9.6 mg/dL   URINE IMMUNOASSAY DRUG SCREEN   Result Value Ref Range    Amphetamines Screen Pending confirm Negative    Barbiturates Screen Negative Negative    Cocaine Screen Negative Negative    Benzodiazepine Screen Pending confirm Negative    Methadone Screen Negative Negative    Opiates Screen Negative  Negative    Oxycodone Screen Negative Negative    Phencyclidine Screen Negative Negative    THC Screen Pending confirm Negative    UR Drug Screen Interpretation See Comment Negative   CPK-CREATINE PHOSPHOKINASE, BLOOD   Result Value Ref Range    CPK 11336 (HH) 0 - 175 U/L   CKMB+INDEX (NO CPK) PANEL, BLOOD   Result Value Ref Range    CK-MB 25.0 ng/mL    CK-MB Index 0.2 %   TROPONIN T, BLOOD   Result Value Ref Range    Troponin T <0.01 <0.01 ng/mL   CBC WITH ADIFF, BLOOD   Result Value Ref Range    WBC 9.1 4.0 - 10.0 1000/mm3    RBC 3.90 mill/mm3    Hgb 10.1 gm/dL    Hct 60.4 %    MCV 54.0 79.0 - 95.0 um3    MCH 25.9 (L) 26.0 - 32.0 pgm    MCHC 31.7 (L) 32.0 - 36.0 g/dL    RDW 98.1 (H) 19.1 - 14.0 %    MPV 9.5 9.4 - 12.4 fL    Plt Count 239 140 - 370 1000/mm3    Segs 89 %    Lymphocytes 5 %    Monocytes 4 %    Eosinophils 2 %    ANC-Manual Mode 8.1 (H) 1.6 - 7.0 1000/mm3    Abs Lymphs 0.5 (L) 0.8 - 3.1 1000/mm3    Abs Monos 0.4 0.2 - 0.8 1000/mm3    Abs Eosinophils 0.2 <0.1 - 0.5 1000/mm3    Diff Type Manual    MDIFF   Result Value Ref Range    Segs 89 %    Lymphocytes 5 %    Monocytes 4 %    Eosinophils 2 %    Number of Cells Counted 117     Diff Type Manual     ANC-Manual Mode 8.1 (H) 1.6 - 7.0 1000/mm3    Abs Lymphs 0.5 (L) 0.8 - 3.1 1000/mm3    Abs Monos 0.4 0.2 - 0.8 1000/mm3    Abs Eosinophils 0.2 <0.1 - 0.5 1000/mm3  Plt Est Adequate     RBC Comment Normal     Polychromasia 1+     Ovalocytes 1+     Burr Cell 1+     Hypochromia 1+     Target Cells 1+    GLUCOSE (POCT)   Result Value Ref Range    Glucose (POCT) 115 (H) 70 - 99 mg/dL   CPK-CREATINE PHOSPHOKINASE, BLOOD   Result Value Ref Range    CPK 12397 (HH) 0 - 175 U/L   PHOSPHORUS, BLOOD   Result Value Ref Range    Phosphorous 3.6 2.7 - 4.5 mg/dL   MAGNESIUM, BLOOD   Result Value Ref Range    Magnesium 2.2 1.7 - 2.3 mg/dL   TSH, BLOOD   Result Value Ref Range    TSH 2.58 0.27 - 4.20 uIU/mL   BASIC METABOLIC PANEL, BLOOD   Result Value Ref Range    Glucose  110 (H) 70 - 99 mg/dL    BUN 19 8 - 23 mg/dL    Creatinine 4.401.01 mg/dL    GFR >34>60 mL/min    Sodium 146 (H) 136 - 145 mmol/L    Potassium 3.5 3.5 - 5.1 mmol/L    Chloride 107 98 - 107 mmol/L    Bicarbonate 25 22 - 29 mmol/L    Anion Gap 14 7 - 15 mmol/L    Calcium 8.4 8.2 - 9.6 mg/dL   GLUCOSE (POCT)   Result Value Ref Range    Glucose (POCT) 101 (H) 70 - 99 mg/dL     Lab Results   Component Value Date    WBC 9.1 07/20/2016    HGB 10.1 07/20/2016    HCT 31.9 07/20/2016    PLT 239 07/20/2016     Lab Results   Component Value Date    NA 146 07/20/2016    K 3.5 07/20/2016    CL 107 07/20/2016    BICARB 25 07/20/2016    BUN 19 07/20/2016    CREAT 1.01 07/20/2016    GLU 110 07/20/2016    [place] 8.4 07/20/2016     Lab Results   Component Value Date    INR 1.1 07/19/2016    PTT 30.3 07/19/2016         Assessment: 46F with left subtrochanteric femur fx s/p traction pin, plan for OR today for IMN.     Plan:    - Pain : po  Pain meds per trauma team   - DVT ppx:  Hold today   - Antibiotics: Perioperative Ancef Q8H x 2 for 24h post op   - Activity: NWB LE  - NPO today   - OR today. Consent in chart.   - f/u left shoulder and humerus, left hand. Right knee films.     The patient was seen and examined with the senior resident and the above plan was discussed.      Leanne ChangPreet Ree Alcalde, MD   Orthopaedic Surgery Resident, PGY-2  Pager #: [phone]) [phone]

## 2016-07-20 NOTE — Interdisciplinary (Addendum)
0030- informed trauma resident that she is screaming in pain and begging for pain relief. She is also anxious and keeps repeating the event before jumping from balcony "men are trying to rape me".   [id number]- dilaudid and ativan given as ordered  0100- desating to the 70's with periods of apnea. Per daughter at bedside, she does have sleep apnea. Easily aroused and very upset and screaming when stimulated. Resp therapy placed a nasal trumpet. Informed Dr Leona SingletonLake.

## 2016-07-20 NOTE — Interdisciplinary (Signed)
Trauma made aware unable to do leg ct because pt refused after pt brought to scanner despite mult and freq reassurance

## 2016-07-20 NOTE — Interdisciplinary (Signed)
Pt very agitated: screaming, attempting to pull ccollar off, attempting to get oob. Pt states "i,m gonna start throwing things at you if you don't take this off.' Drs Reita Chardmunden, Cindy Hazyalbini and alhumayed at bs. Dr Reita Chardmunden reexamine neck, pt c/o tenderness= ccollar kept in place. Will reeval ccollar in an hour.

## 2016-07-20 NOTE — Interdisciplinary (Signed)
texted to trauma= si9b yellowtail- no bld available for OR. pls order type and scree/cross if needed. thanks

## 2016-07-20 NOTE — Interdisciplinary (Signed)
Pt's daughter requested toothbrush and toothpaste for herself. Informed daughter and apologized that we do not have supplies to give to family members and that our patient supplies are for patients only.  She verbalized understanding but as this RN walked away mumbled "I've been here all night" asked the patient's sitter for a toothbrush. Notified the primary RN, Investment banker, corporaterna and Press photographercharge Nurse, Andrey CampanileSandy.

## 2016-07-20 NOTE — Plan of Care (Signed)
Problem: Altered thought process as evidenced by psychosis/mood/cognitive dysfunction  Goal: Reduction and/or stabilization of target symptoms  Outcome: Unable to meet goal at this time.  Pt and family updated on condition and plan of care. All questions addressed. Emotional support given. Pt uncooperative and requires a lot of encouragement, reassurance and redirection. Pt refused ct as previuosly noted. Pt refused repositioning    Problem: Breathing Pattern - Ineffective  Goal: Respiratory rate, rhythm and depth return to baseline  Outcome: Unable to meet goal at this time.  desats to low 90s on RA; o2 replaced    Problem: Pain - Acute  Goal: Communication of presence of pain  Outcome: Unable to meet goal at this time.  Refer to pain screening flowsheet and MAR. Comfort measures provided. Dilaudid given about hourly as needed    Problem: Mobility - Impaired  Goal: Able to perform range-of-motion exercises independently  Outcome: Unable to meet goal at this time.  Pt refused turns despite multiple attempts

## 2016-07-20 NOTE — Interdisciplinary (Signed)
Trauma came to bs to clear cspine but pt c/o tenderness. Will change philly collar to aspen when pt ready

## 2016-07-21 DIAGNOSIS — E669 Obesity, unspecified: Secondary | ICD-10-CM

## 2016-07-21 DIAGNOSIS — E785 Hyperlipidemia, unspecified: Secondary | ICD-10-CM

## 2016-07-21 DIAGNOSIS — F29 Unspecified psychosis not due to a substance or known physiological condition: Secondary | ICD-10-CM

## 2016-07-21 DIAGNOSIS — F15922 Other stimulant use, unspecified with intoxication with perceptual disturbance: Secondary | ICD-10-CM

## 2016-07-21 DIAGNOSIS — S8292XA Unspecified fracture of left lower leg, initial encounter for closed fracture: Secondary | ICD-10-CM

## 2016-07-21 DIAGNOSIS — F1190 Opioid use, unspecified, uncomplicated: Secondary | ICD-10-CM

## 2016-07-21 LAB — CBC WITH DIFF, BLOOD
ANC-Automated: 8.5 10*3/uL — ABNORMAL HIGH (ref 1.6–7.0)
ANC-Automated: 9.7 10*3/uL — ABNORMAL HIGH (ref 1.6–7.0)
Abs Basophils: 0.1 10*3/uL (ref <0.1)
Abs Eosinophils: 0.1 10*3/uL (ref 0.1–0.5)
Abs Eosinophils: 0.2 10*3/uL (ref 0.1–0.5)
Abs Lymphs: 0.6 10*3/uL — ABNORMAL LOW (ref 0.8–3.1)
Abs Lymphs: 0.8 10*3/uL (ref 0.8–3.1)
Abs Monos: 0.6 10*3/uL (ref 0.2–0.8)
Abs Monos: 0.8 10*3/uL (ref 0.2–0.8)
Basophils: 1 %
Eosinophils: 1 %
Eosinophils: 2 %
Hct: 21.9 %
Hct: 25 %
Hgb: 7.1 gm/dL
Hgb: 8.1 gm/dL
Lymphocytes: 6 %
Lymphocytes: 7 %
MCH: 26.4 pg (ref 26.0–32.0)
MCH: 26.8 pg (ref 26.0–32.0)
MCHC: 32.4 g/dL (ref 32.0–36.0)
MCHC: 32.4 g/dL (ref 32.0–36.0)
MCV: 81.4 um3 (ref 79.0–95.0)
MCV: 82.8 um3 (ref 79.0–95.0)
MPV: 9 fL — ABNORMAL LOW (ref 9.4–12.4)
MPV: 9.7 fL (ref 9.4–12.4)
Monocytes: 6 %
Monocytes: 7 %
Plt Count: 162 10*3/uL (ref 140–370)
Plt Count: 154 10*3/uL (ref 140–370)
RBC: 2.69 10*6/uL
RBC: 3.02 10*6/uL
RDW: 18.5 % — ABNORMAL HIGH (ref 12.0–14.0)
RDW: 17.6 % — ABNORMAL HIGH (ref 12.0–14.0)
Segs: 85 %
Segs: 85 %
WBC: 9.9 10*3/uL (ref 4.0–10.0)
WBC: 11.5 10*3/uL — ABNORMAL HIGH (ref 4.0–10.0)

## 2016-07-21 LAB — URINALYSIS
Bilirubin: NEGATIVE
Glucose: NEGATIVE
Leuk Esterase: NEGATIVE
Nitrite: NEGATIVE
Specific Gravity: 1.018 (ref 1.002–1.030)
pH: 6 (ref 5.0–8.0)

## 2016-07-21 LAB — PROTHROMBIN TIME, BLOOD
INR: 1.2
PT,Patient: 13.5 s — ABNORMAL HIGH (ref 9.7–12.5)

## 2016-07-21 LAB — CPK-CREATINE PHOSPHOKINASE, BLOOD
CPK: 11011 U/L (ref 0–175)
CPK: 11153 U/L (ref 0–175)
CPK: 11480 U/L (ref 0–175)
CPK: 11965 U/L (ref 0–175)
CPK: 9686 U/L (ref 0–175)

## 2016-07-21 LAB — CONFIRM AMPHETAMINES-URINE
Conf. Amphetamine: POSITIVE ng/mL — AB
Conf. MDA: NEGATIVE ng/mL
Conf. MDMA: NEGATIVE ng/mL
Conf. Methamphetamine: NEGATIVE ng/mL

## 2016-07-21 LAB — BASIC METABOLIC PANEL, BLOOD
Anion Gap: 9 mmol/L (ref 7–15)
Anion Gap: 11 mmol/L (ref 7–15)
Anion Gap: 12 mmol/L (ref 7–15)
Anion Gap: 10 mmol/L (ref 7–15)
Anion Gap: 9 mmol/L (ref 7–15)
BUN: 11 mg/dL (ref 8–23)
BUN: 10 mg/dL (ref 8–23)
BUN: 8 mg/dL (ref 8–23)
BUN: 7 mg/dL — ABNORMAL LOW (ref 8–23)
BUN: 6 mg/dL — ABNORMAL LOW (ref 8–23)
Bicarbonate: 25 mmol/L (ref 22–29)
Bicarbonate: 23 mmol/L (ref 22–29)
Bicarbonate: 23 mmol/L (ref 22–29)
Bicarbonate: 23 mmol/L (ref 22–29)
Bicarbonate: 24 mmol/L (ref 22–29)
Calcium: 7.5 mg/dL — ABNORMAL LOW (ref 8.2–9.6)
Calcium: 7.6 mg/dL — ABNORMAL LOW (ref 8.2–9.6)
Calcium: 7.7 mg/dL — ABNORMAL LOW (ref 8.2–9.6)
Calcium: 7.8 mg/dL — ABNORMAL LOW (ref 8.2–9.6)
Calcium: 7.7 mg/dL — ABNORMAL LOW (ref 8.2–9.6)
Chloride: 105 mmol/L (ref 98–107)
Chloride: 106 mmol/L (ref 98–107)
Chloride: 105 mmol/L (ref 98–107)
Chloride: 109 mmol/L — ABNORMAL HIGH (ref 98–107)
Chloride: 105 mmol/L (ref 98–107)
Creatinine: 0.85 mg/dL
Creatinine: 0.83 mg/dL
Creatinine: 0.81 mg/dL
Creatinine: 0.77 mg/dL
Creatinine: 0.79 mg/dL
GFR: >60 mL/min
GFR: >60 mL/min
GFR: >60 mL/min
GFR: >60 mL/min
GFR: >60 mL/min
Glucose: 128 mg/dL — ABNORMAL HIGH (ref 70–99)
Glucose: 91 mg/dL (ref 70–99)
Glucose: 93 mg/dL (ref 70–99)
Glucose: 88 mg/dL (ref 70–99)
Glucose: 90 mg/dL (ref 70–99)
Potassium: 3.8 mmol/L (ref 3.5–5.1)
Potassium: 3.7 mmol/L (ref 3.5–5.1)
Potassium: 3.7 mmol/L (ref 3.5–5.1)
Potassium: 3.6 mmol/L (ref 3.5–5.1)
Potassium: 3.5 mmol/L (ref 3.5–5.1)
Sodium: 139 mmol/L (ref 136–145)
Sodium: 140 mmol/L (ref 136–145)
Sodium: 140 mmol/L (ref 136–145)
Sodium: 142 mmol/L (ref 136–145)
Sodium: 138 mmol/L (ref 136–145)

## 2016-07-21 LAB — PREPARE/CROSSMATCH PRBCS
Dispense Status: TRANSFUSED
Expiration: 201711042359
Red Blood Cells: TRANSFUSED
Type: B NEG

## 2016-07-21 LAB — MRSA SURVEILLANCE CULTURE

## 2016-07-21 LAB — PHOSPHORUS, BLOOD: Phosphorous: 2.5 mg/dL — ABNORMAL LOW (ref 2.7–4.5)

## 2016-07-21 LAB — PREALBUMIN (TRANSTHYRETIN), BLOOD: Prealbumin: 14 mg/dL — ABNORMAL LOW (ref 20–40)

## 2016-07-21 LAB — MAGNESIUM, BLOOD: Magnesium: 1.9 mg/dL (ref 1.7–2.3)

## 2016-07-21 MED ORDER — PREGABALIN 75 MG OR CAPS
200.0000 mg | ORAL_CAPSULE | Freq: Every day | ORAL | Status: DC
Start: 2016-07-22 — End: 2016-07-23
  Administered 2016-07-22 – 2016-07-23 (×2): 200 mg via ORAL
  Filled 2016-07-21 (×2): qty 1

## 2016-07-21 MED ORDER — LANSOPRAZOLE 30 MG OR CPDR
30.0000 mg | DELAYED_RELEASE_CAPSULE | Freq: Every day | ORAL | Status: DC
Start: 2016-07-22 — End: 2016-07-23
  Administered 2016-07-22 – 2016-07-23 (×2): 30 mg via ORAL
  Filled 2016-07-21 (×2): qty 1

## 2016-07-21 MED ORDER — LORAZEPAM 2 MG/ML IJ SOLN
0.50 mg | Freq: Once | INTRAMUSCULAR | Status: AC | PRN
Start: 2016-07-21 — End: 2016-07-21
  Administered 2016-07-21: 0.5 mg via INTRAVENOUS
  Filled 2016-07-21: qty 1

## 2016-07-21 MED ORDER — SODIUM CHLORIDE 0.9 % IV BOLUS
1000.00 mL | INJECTION | Freq: Once | INTRAVENOUS | Status: AC
Start: 2016-07-21 — End: 2016-07-21
  Administered 2016-07-21: 1000 mL via INTRAVENOUS

## 2016-07-21 MED ORDER — AMLODIPINE 2.5 MG OR TABS
2.5000 mg | ORAL_TABLET | Freq: Every day | ORAL | Status: DC
Start: 2016-07-22 — End: 2016-07-23
  Administered 2016-07-22 – 2016-07-23 (×2): 2.5 mg via ORAL
  Filled 2016-07-21 (×2): qty 1

## 2016-07-21 MED ORDER — ACETAMINOPHEN 325 MG PO TABS
650.00 mg | ORAL_TABLET | Freq: Once | ORAL | Status: AC
Start: 2016-07-21 — End: 2016-07-22

## 2016-07-21 MED ORDER — HYDROMORPHONE PCA 0.2 MG/ML SYRINGE
INTRAMUSCULAR | Status: DC
Start: 2016-07-21 — End: 2016-07-23
  Administered 2016-07-21 – 2016-07-23 (×3): via INTRAVENOUS
  Filled 2016-07-21 (×3): qty 50

## 2016-07-21 MED ORDER — SODIUM CHLORIDE 0.9 % IV SOLN
25.0000 mg | Freq: Four times a day (QID) | INTRAVENOUS | Status: DC | PRN
Start: 2016-07-21 — End: 2016-07-21
  Filled 2016-07-21 (×2): qty 25

## 2016-07-21 MED ORDER — DIPHENHYDRAMINE HCL 50 MG/ML IJ SOLN
25.0000 mg | Freq: Four times a day (QID) | INTRAMUSCULAR | Status: DC | PRN
Start: 2016-07-21 — End: 2016-07-23
  Administered 2016-07-21 – 2016-07-23 (×3): 25 mg via INTRAVENOUS
  Filled 2016-07-21 (×3): qty 50

## 2016-07-21 MED ORDER — LORAZEPAM 2 MG/ML IJ SOLN
0.50 mg | Freq: Once | INTRAMUSCULAR | Status: AC
Start: 2016-07-21 — End: 2016-07-21
  Administered 2016-07-21: 0.5 mg via INTRAVENOUS
  Filled 2016-07-21: qty 1

## 2016-07-21 MED ORDER — SODIUM CHLORIDE 0.9 % IV BOLUS
500.00 mL | INJECTION | Freq: Once | INTRAVENOUS | Status: AC
Start: 2016-07-21 — End: 2016-07-21
  Administered 2016-07-21: 500 mL via INTRAVENOUS

## 2016-07-21 NOTE — Plan of Care (Signed)
Problem: Altered thought process as evidenced by psychosis/mood/cognitive dysfunction  Goal: Reduction and/or stabilization of target symptoms  Outcome: Progressing toward goal, anticipate improvement over: >48 hours  Pt. Very uncooperative and unreasonable this morning, but also reporting 10/10 pain.  Pt. More cooperative and reasonable, and working with health care providers this evening.  Pt. Continues to report 9/10 pain to Left shoulder and L hip, but tolerating turns in bed, and able to rest while undisturbed.    Problem: Breathing Pattern - Ineffective  Goal: Respiratory rate, rhythm and depth return to baseline  Outcome: Goal Met  Pt. Requires supplemental oxygen by nasal cannula briefly this afternoon, but on room air now.  Will continue to monitor to keep O2 sats greater than 92%.    Problem: Pain - Acute  Goal: Communication of presence of pain  Outcome: Progressing toward goal, anticipate improvement over: next 12-24 hours  Pt. Reporting "horrific" pain this morning and multiple sites: headache, shoulder

## 2016-07-21 NOTE — Interdisciplinary (Signed)
Explained to my Nursing Manager, Baldemar LenisJ Burkhart, patient requests, and difficulty with providing her care.  Trauma team is aware of pt. Refusal of care, and tylenol, and request for pain medicine only.  Pt. Refuses to eat breakfast, but when she requested [place] juice, that was provided.  Vital signs are stable, pt. Is resting while undisturbed.

## 2016-07-21 NOTE — Progress Notes (Signed)
Cervical Spine Clearance    Final CT C-spine negative. C-spine is nontender to palpation, patient has no pain with flexion, extension and lateral rotation of neck, and no distracting injuries. C-collar cleared clinically. C-collar removed and patient cleared from C-spine precautions.     Kyrollos Cordell, MD  General Surgery Resident

## 2016-07-21 NOTE — Plan of Care (Signed)
Problem: Altered thought process as evidenced by psychosis/mood/cognitive dysfunction  Goal: Reduction and/or stabilization of target symptoms  Outcome: Progressing toward goal, anticipate improvement over: >48 hours  pateint has mood swings    Problem: Breathing Pattern - Ineffective  Goal: Respiratory rate, rhythm and depth return to baseline  Outcome: Goal Met  Pt is at baseline    Problem: Pain - Acute  Goal: Communication of presence of pain  Outcome: Progressing toward goal, anticipate improvement over: >48 hours  Pt c/o pain and forgets how to use her PCA    Problem: Mobility - Impaired  Goal: Able to perform range-of-motion exercises independently  Outcome: Progressing toward goal, anticipate improvement over: next 12-24 hours  Patient is able to move all extremities well except her left broken leg

## 2016-07-21 NOTE — Progress Notes (Signed)
[facility]    MULTIDISCIPLINARY ROUNDS      Room#: ICU09/ICU09B   Date: 07/21/16  Time: 2:30 PM    Hospital Day:   2 days - Admitted on: 07/19/2016  ICU Day: 2    Contact Isolation: No      Consulting Services: Orthopedic Surgery  Psychiatry    Advanced Care Planning Note: No    Code Status: Full code / full care    Pending OR: No       Wound Care:  Central Line: femoral  Arterial Line: none  Foley: No    WCN consulted: No  Decubitus Ulcer: No  Sutures: No, D/C date:   Other:       Respiratory:  Oxygen support: No  Mode:   unassisted  CPAP/SPRINT trials:  N/A Duration - , RSBI start-  end-  none        PT/OT:  Therapies: Progressive mobility ordered? Yes  Weight-Bearing status: if has negative CT LLE weight bear as tolerated. Until CT, non weight bearing   Bracing/splinting:No      Speech Path:  Dysphagia (new):No  Hx dysphagia:No    Additional testing ORDER: Speech, bedside swallow, Lang-Cog, PMV       Nutrition:   No results found for: PREALBUMIN  No results found for: ALB    Diet: regular    Supplement: No:   Active calorie count: No; day       Pharmacy:  IV drips:  Anti-Xa level:   Antiplatelet: none   Anticoagulation:     ABX: No:   ABX: none  ABX changes:      Social Work:  Proxy: unknown   Family/Friends:   Substance abuse: Yes  Psych consult: Yes      Palliative Care:  Palliative care consult:No  Indication:   LOS > 7/MODS (>2) / 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

## 2016-07-21 NOTE — Interdisciplinary (Signed)
Trauma Team aware that pt. Is refusing care, and has a headache.  Tylenol offered, but refused by patient.  "Tylenol won't do it"  "What would help with your headache"  "Some dilaudid, some pain medicine".  "I need some dilaudid, for my shoulder"  Pt. Requesting narcotics, not allowing RN to do an assessment, and reporting multiple sites for pain.  Trauma team aware.

## 2016-07-21 NOTE — Op Note (Signed)
Name:  Alexandria Proctor  MR#:  [id number]  DOB:  09/22/1900  Account #:  [id number]  Date of Adm:  07/19/2016  Date of Service:  07/20/2016    PREOPERATIVE DIAGNOSIS: Left intertrochanteric fracture.    POSTOPERATIVE DIAGNOSIS: Left intertrochanteric fracture.    PROCEDURE PERFORMED: Intramedullary nailing of left femur.    ATTENDING SURGEON: Shivani Barrantes K. Tessa Lerner, MD    ASSISTANT: Baxter Kail, MD    ANESTHESIA: General endotracheal.    FINDINGS: Low left intertrochanteric fracture with mild varus  laxity of the left knee.    SPECIMENS: None.    INTRAVENOUS FLUIDS: 1.5 of crystalloid, 500 mL of albumin.    BLOOD PRODUCTS: None.    ESTIMATED BLOOD LOSS: 200 mL.    URINE OUTPUT: Per Anesthesia record.    COMPLICATIONS: None.    DISPOSITION: The patient will be nonweightbearing on the left  lower extremity for now. After examination of the knee and  further analysis of the radiographs, we have determined that  there could be a potential tibial plateau fracture, so a CT scan  has been ordered to assess for that. If the CT scan is negative,  we will progress her to weightbearing as tolerated on the left  lower extremity. She will receive 2 doses of perioperative Ancef  q.8 hours. She will be on Lovenox DVT prophylaxis for 4 weeks. We  recommend a Physical Therapy consultation to begin on  postoperative day 1.    INDICATIONS FOR PROCEDURE: The patient is a 60 year old female  who jumped from a 3rd story balcony, was in an altered mental  state and evaluated in our [facility] after transport by  emergency medical services. Radiographs demonstrated a fracture  of the left intertrochanteric region of the femur. The fracture  was in a very shortened position, so a traction pin was placed in  the distal femur to help with reduction of the fracture. The  patient, due to her altered mental state, was not able to consent  to surgery, but was indicated for fixation with an intramedullary  nail. The patient's daughter,  however, was available and was  consented for the surgical procedure after all risks,  alternatives and benefits were discussed in detail.    PROCEDURE IN DETAIL: On the day of the surgery, we met the  patient in the preoperative holding area, where the correct  surgical site was identified and marked. She was then transported  to the operating suite, where she was positioned supine on the  Hana table. All bony prominences were padded as she was placed  into the fracture boot. The left lower extremity was prepped and  draped in standard sterile fashion and the legs were positioned  appropriately to allow for adequate fluoroscopic imaging. The  patient was then completed with preoperative prep. We took a  couple final fluoroscopic imaging shots with varying degrees of  rotation and traction on the hip until we felt like we had a good  reduction. At this point, we elected to scrub for surgery and a  preoperative time-out was performed, confirming the correct  patient, side and site of the procedure to be performed as well  as confirmation that all necessary personnel and equipment were  available in the operating room. We were all in agreement and we  elected to proceed.    We initially approached the left hip through a percutaneous  approach using the first guide pin. This was placed just to the  very medial aspect  of the greater trochanter tip in the AP plane  on the fluoroscopic imaging. A lateral view fluoroscopy was then  used to confirm the correct positioning in the sagittal plane.  Once the starting point was seen to be correct, we used a mallet  to tap this into place. We then advanced this slightly across the  fracture site. We then used an opening reamer to advance through  the greater trochanter. At this point, we then selected our ball-  tipped guidewire with a slight bend at the tip to ease with our  advancing into the femoral canal. An incision was made over the  lateral aspect of the greater trochanter  and the ball-spiked  pusher was used to help hold the reduction of the fracture. This  was again confirmed using fluoroscopic imaging. The ball-tipped  guidewire was advanced down to the distal femur to the physeal  scar. This was also confirmed using fluoroscopy.    Once the ball-tipped guidewire was in place, we then used a  measuring device to determine that the correct size nail would be  380 mm in length. We then started with our sequential reaming  over the guidewire, starting with a size 8 and working our way up  1 size at a time until a size 12, where we had good cortical  chatter and felt like this was the correct size. We then selected  a size 11 [employer] trochanteric fixation nail, 380 mm in length  with 125-degree neck-shaft angle and inserted this using the jig.  This was advanced down the femur using fluoroscopic imaging and  the ball-spiked pusher to help with the reduction. Once this was  inserted into position, we were very happy with the reduction of  the fracture site. We then attached the guide jig to the nail  insertion device and drilled a pin up into the femoral neck and  head. We confirmed the position of the head in the AP and lateral  planes on fluoroscopy and determined that a 90 mm was the correct  size for the helical screw.    At this point, we then filled our opening reamer for the helical  screw followed by insertion of the drill and the drill was  advanced up into the femoral head. We noted this to be the  correct length. We then selected the size 90 mm helical screw and  advanced this up into the femoral head. We then locked the  locking mechanism of the nail into place and then turned 1/2 turn  off to allow for some settling in the fracture healing. At this  point, we then removed the jig entirely from the nail and  proceeded to move distally to perform our perfect-circles  technique using fluoroscopy to place 1 interlocking screw from  lateral to medial across the nail. This was  done without any  complication. At this point, we then proceeded with closure of  all surgical wounds, including an 0 Vicryl closure of the fascial  layer in the proximal incision through which the nail had been  inserted followed by subcutaneous closure using 2-0 Monocryl  followed by staples for the skin. Dry dressings were placed and  the patient was allowed to awaken from anesthesia, and she was  then transported to the PACU in stable condition.    ATTESTATION: Dr. Tessa Lerner was present and scrubbed for the case  in its entirety.            Marquest Gunkel  Raliegh Ip MD    AKS / MMB  D:  07/20/2016   15:17  T:  07/21/2016   12:12  Job #:  [id number]

## 2016-07-21 NOTE — Interdisciplinary (Signed)
Rounded on patient after discussing plan for patient's primary RN T. Maidl. Patient states she has a head ache. Informed her that her nurse has notified the physician, and tylenol has been ordered. She is refusing tylenol, stating "it is not going to do anything." Encouraged her to take the tylenol, so physicians can assess if it is affective. She has declined the tylenol, and also has declined to turn for skin assessment. States she fell and has no skin issues.

## 2016-07-21 NOTE — Progress Notes (Signed)
Progress Note  Trauma Intensive Care Unit    Patient Name: 29  MRN: [id number]  Room#: VWU98/JXB14N    Service: Trauma Surgery    Date: 07/20/16  Hospital Day:   2 days - Admitted on: 07/19/2016  Post Injury Day: 1  Post Operative Day(s):  n/a  Mechanism of Injury: Jumped/fall     #  Injury  Management  Date    left subtroch femur fracture Ortho: s/p repair 10/29  07/19/16    Rhabdo Q6h CPK and fluid rescus  10/28    Psychosis  Psych consult       SUBJECTIVE: 48F who fell from a balcony while trying to cross to a lower balcony. Repeats "I'm not crazy"    Events/Complaints: episode of hypotension overnight received 1L fluid bolus and 1 u prbc . Responded. Stable bp    OBJECTIVE:     Vitals signs:     Latest Entry  Range (last 24 hours)    Temperature: 97.8 F (36.6 C)  Temp  Avg: 97.5 F (36.4 C)  Min: 97 F (36.1 C)  Max: 98.4 F (36.9 C)    Blood pressure (BP): 94/61  BP  Min: 75/47  Max: 124/69    Heart Rate: 79  Pulse  Avg: 77  Min: 66  Max: 95    Respirations: 16  Resp  Avg: 16.7  Min: 9  Max: 24    SpO2: 92 %  SpO2  Avg: 97.3 %  Min: 92 %  Max: 100 %       No Data Recorded       No Data Recorded       No Data Recorded     Weight: 104 kg (229 lb 4.5 oz)  Percentage Weight Change (%): 0 %    Intake/Output       07/20/16 0600 - 07/21/16 0559 07/21/16 0600 - 07/22/16 0559      [id number] 0865-7846 Total 0600-1759 9629-5284 Total       Intake    P.O.  600  340 940  360  -- 360    I.V.  3367.5  3700 7067.5  500  -- 500    Total Intake 3967.5 4040 8007.5 860 -- 860       Output    Urine  285  465 750  250  -- 250    Total Output 285 465 750 250 -- 250       Net I/O     3682.5 3575 7257.5 610 -- 610            Respiratory support:  Oxygen Therapy  SpO2: 92 %  O2 Device: Nasal cannula  ETCO2 (mmHg): 24 mmHg  O2 Flow Rate (L/min): 2 l/min       No results for input(s): ARTPH, ARTPCO2, ARTPO2, ARTHC03, ARTBE, ARTO2SAT in the last 72 hours.    Physical Exam:  General Appearance: resting comfortbaly   Neuro:  GCS 13 (E4, V4, M6, one off for confusion (pt refusing to answer questions, but following commands   Eyes: Conjunctivae and corneas clear. Pupils reactive (4 to 2 mm on R, 3 to 1 mm on L). EOM's intact., swelling and ecchymosis to R eye  Heart: RRR  Lungs: Clear to auscultation bilaterally; no chest wall tenderness to palpation,  Abdomen: Soft, non-tender, non-distended  Pelvis: Stable to compression; tender to palpation  Rectal: Good tone, no gross blood.  MSK: Motor strength grossly intact in all extremities. 2+ peripheral pulses.  left elbow abrasion       Labs:  CBC  Recent Labs      07/21/16   0046  07/21/16   0527   WBC  9.9  11.5*   HGB  7.1  8.1   HCT  21.9  25.0   PLT  162  154   SEG  85  85   LYMPHS  6  7   MONOS  6  7        Chemistry  Recent Labs      07/20/16   0552   07/21/16   0046  07/21/16   0527   NA  146*   < >  139  140   K  3.5   < >  3.8  3.7   CL  107   < >  105  106   BICARB  25   < >  25  23   BUN  19   < >  11  10   CREAT  1.01   < >  0.85  0.83   GLU  110*   < >  128*  91   Rheems  8.4   < >  7.5*  7.6*   MG  2.2   --    --   1.9   PHOS  3.6   --    --   2.5*    < > = values in this interval not displayed.     No results for input(s): ALK, AST, ALT, TBILI, DBILI, ALB in the last 72 hours.       Coags  Recent Labs      07/19/16   1925  07/21/16   0527   PT  11.8  13.5*   PTT  30.3   --    INR  1.1  1.2       Toxicology:  Lab Results   Component Value Date    ETOH <11 07/19/2016    BARBCLASS Negative 07/20/2016    BENZDIAZCL Pending confirm 07/20/2016    BENZYLCGNN Negative 07/20/2016    METHADONE Negative 07/20/2016    OPIATESCL Negative 07/20/2016    OXY Negative 07/20/2016    PHENCYCLDN Negative 07/20/2016    TETCANNABIN Pending confirm 07/20/2016       Radiology:   X-ray Shoulder Complete Min 2 Views - Left    Result Date: 07/20/2016  IMPRESSION: No evidence of acute fracture. Widening of the acromioclavicular joint which measures 11 mm, concerning for acromioclavicular  joint injury. Probable hydroxyapatite crystal deposition in the rotator cuff.     X-ray Elbow 2 Views - Right     IMPRESSION: Question small amount of subcutaneous emphysema at the antecubital fossa and please correlate physical exam findings and for possible soft tissue injury in  this region.  No acute fracture or dislocation.    X-ray Knee 1 Or 2 Views - Left    Result Date: 07/19/2016   IMPRESSION: No acute osseous abnormality of the left knee    X-ray Tibia & Fibula 2 Views - Left    Result Date: 07/19/2016   IMPRESSION: No acute osseous abnormality of the left tibia and fibula.    X-ray Hip Unilateral With Pelvis 1 View - Left    Result Date: 07/20/2016   IMPRESSION: Markedly improved alignment of the displaced left proximal femoral subtrochanteric comminuted fracture. Interval placement of traction pin through the distal femoral shaft.     X-ray Hip Unilateral  With Pelvis 2 Or 3 Views - Left    Result Date: 07/19/2016  IMPRESSION: Re-demonstration of a markedly displaced left proximal femoral comminuted fracture, centered at the subtrochanteric region, with greater than a full shaft width lateral displacement of the distal fracture fragment. Background trauma board artifact.  I have reviewed the images and agree with the resident interpretation.    X-ray Femur Minimum 2 Views - Left    Result Date: 07/20/2016   IMPRESSION: Markedly improved alignment of the displaced left proximal femoral subtrochanteric comminuted fracture. Interval placement of traction pin through the distal femoral shaft.    X-ray Femur Minimum 2 Views - Left    Result Date: 07/19/2016   IMPRESSION: Re-demonstration of a markedly displaced left proximal femoral comminuted fracture, centered at the subtrochanteric region, with greater than a full shaft width displacement of the distal fracture fragment. Please refer to concurrently reported left hip and left knee radiographs for additional findings and impression.   Ct Head W/o  Contrast    Result Date: 07/19/2016   IMPRESSION: 1. No evidence of acute intracranial abnormality, including no intracranial hemorrhage, extra-axial fluid collection, midline shift, or hydrocephalus. 2. No evidence of a calvarial or skullbase fracture.     X-ray Chest Single View Frontal    Result Date: 07/19/2016   IMPRESSION: No radiographic evidence of acute pulmonary abnormality.  I have reviewed the images and agree with the resident interpretation.    X-ray Thoracic Spine 2 Views    Result Date: 07/19/2016   IMPRESSION: No radiographic evidence of an acute osseous abnormality the thoracic and lumbar spine.     Ct C-spine W/o Contrast    Result Date: 07/19/2016   IMPRESSION: 1. No acute osseous findings of the cervical spine. 2. Mild degenerative changes of the cervical spine, as described above.     X-ray Pelvis 1 Or 2 Views    . IMPRESSION: 1. Markedly displaced left proximal femoral comminuted fracture, centered at the subtrochanteric region, with greater than a full shaft width displacement of the distal fracture fragment.  Critical Results:      US Abdomen Limited     IMPRESSION: No sonographic evidence of gross abdominal injury.     Medications:  Scheduled Meds  . acetaminophen  650 mg Once   . ceFAZolin (ANCEF) IV  1,000 mg Q8H   . docusate sodium  250 mg Nightly   . enoxaparin  40 mg Q12H   . famotidine  20 mg Q12H    Or   . famotidine  20 mg Q12H   . senna  2 tablet QAM     PRN Meds  . acetaminophen  650 mg Q4H PRN   . HYDROmorphone  0.5 mg Q1H PRN   . HYDROmorphone  0.5 mg Q1H PRN   . HYDROmorphone  1 mg Q1H PRN   . nalOXone  0.1 mg Q2 Min PRN   . OLANZapine  2.5 mg Q6H PRN     IV Meds  . sodium chloride 100 mL/hr (07/21/16 1016)       Allergies:  Allergies   Allergen Reactions   . Haldol [Haloperidol] Swelling   . Ondansetron Swelling   . Toradol [Ketorolac Tromethamine] Swelling   . Reglan [Metoclopramide] Unspecified   . Tramadol Unspecified       Smoking History:    reports that he has been  smoking.  He does not have any smokeless tobacco history on file.    Lines, Drains, and Airways:  CVC  Triple Lumen - Right Femoral (Active)   Site Assessment Phlebitis 0 07/19/2016 11:30 PM   Proximal Lumen Status Flushed;Infusing;Patent 07/19/2016 11:30 PM   Medial Lumen Status Blood returned;Flushed;Patent;Saline Locked 07/19/2016 11:30 PM   Distal Lumen Status Blood returned;Flushed;Patent;Saline Locked 07/19/2016 11:30 PM   Dressing Transparent;Antimicrobial patch 07/19/2016 11:30 PM   Dressing Status Clean, dry, intact 07/19/2016 11:30 PM   Dressing Change Due 07/20/16 07/19/2016 11:30 PM   Securement/safety measures Secured from movement 07/19/2016 11:30 PM   Line evaluated for d/c? Yes, line use still indicated. 07/19/2016 11:30 PM     Patient safety issues addressed:     Feedings: no  Analgesia: yes  Sedation: no  Thromboprophylaxis: no  Head-of-bed elevation: yes  Ulcer prophylaxis: yes  Glycemic control: no  Spontaneous breathing trial: no  Bowel care: no  Indwelling catheter removal: no  De-escalation of antibiotics: no    Foley catheterization: no  Restraints: no  Family discussion: yes    ASSESSMENT /PLAN:    (1) CV: known HTN, will restart meds after OR   Stable     (2) Pulmonary: sat 98% on RA, no issues     (3) FEN: replace PRN   Regular diet     (4 Neuro: pain not well controlled. pca started today   ccollar cleared     (5) Hem: stable. Daily cbc     (6) Renal: creatinine stable 0.83  cpk >10,000. Initially trending down, trending back up today  Plan: UA , continue fluids. Increased rate to patient's maintenance dose 125    (7) GI: regular diet     (8) Endocrine: no issues  . No diabetes hx. No insulin, stable poc glucoses     (9) ID: afebrile. No abx     (10) Pain: dPCA     (11) OT/PT: needs pt/ot.   Msk: if left lower extremitiy CT neg for tibial plateu fracture can be wbat  Needs CT LLE     (12) Psych: psychosis, pending recs from psych       (65) Consults: psych, ortho, SICU     Discharge Plan:  pending improvement .     Dot Been, MD   Emergency Medicine, p[facility]3

## 2016-07-21 NOTE — Interdisciplinary (Signed)
Patient states "I have a horriffic headache". I offered to do my head to toe assessment, but pt. Refused.  She refused to answer any further assessment question.  Refused temperature check from RN and COA.  Breakfast was offered, but she refused that also.  Will continue to monitor for time to complete assessment, and time when patient is more willing to participate with plan of care.
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
## 2016-07-21 NOTE — Interdisciplinary (Signed)
PT Contact       07/21/16 1417    Therapy Contact Note    Contact Time 1415    Therapy not provided at this time as Patient/family/caregiver declined treatment.    Pt refused despite encouragement and education on benefits of PT.

## 2016-07-21 NOTE — Progress Notes (Signed)
Dictating Practitioner:  Genene ChurnALLISON E Krist Rosenboom, M.D.        Date of Note:  07/20/2016    The patient is a 60 year old female admitted yesterday after trying to  swing from one balcony to another with a blanket.  She subsequently fell  and broke her left femur.  Also found to be in rhabdomyolysis with an  associated acute kidney injury.  She has been severely agitated and  possibly psychotic, now being followed by Psych.  Tox positive for  amphetamines and THC as well as benzodiazepines.  Her femur was placed in  traction overnight.  Did require conscious sedation for this.  She was seen  by Psychiatry, currently on voluntary status.  No other events.    GCS is a 14, she is severely agitated, yelling and screaming requiring  redirection.  Pain control does appear adequate.  This appears to be more  related to agitation.  She does move all extremities with good strength.  Cervical collar is still on pending clearance.  Cardiovascularly she has  been stable in normal sinus rhythm.  Blood pressures are appropriate.  She  did have one elevated troponin at 0.07.  This has trended down to 0.01.  No  arrhythmias.  Saturating well on 2 L per nasal cannula of oxygen.  Lungs  are clear.  Abdomen is soft, nontender, and nondistended.  She is NPO on  I.V. fluids.  500 mL in, 700 mL out.  She did have an acute kidney injury  on presentation with a creatinine of 1.2.  This is now down to 1.  Mild  hypernatremia, 144; hypokalemia, 3.5.  Other electrolytes are appropriate.  CPK was in the 11,000's on admission.  Unclear how much of this is due to  muscle damage from her fracture versus rhabdomyolysis from her amphetamine  use.  Initially trended down slightly, now back up to 12,000.  Urine output  is good volume and clear.  She has been afebrile.  White count is now  normal at 9, no antibiotics.  Hematocrit down slightly to 32 from 36.  Unclear if this is dilutional due to fluid resuscitation for the  rhabdomyolysis or due to some  mild blood loss from her femoral fracture.  No other bleeding source is identified.  Compartments are soft, no  coagulopathy.  Blood sugars have been well-controlled.    Assessment and Plan:  We will get final reads on all of her imaging and do  a thorough tertiary exam.  Given the rise in her CPK we will give her a  bolus of I.V. fluids now.  We will continue a high maintenance rate as well  and continue to closely follow her urine output and trend her CPKs.  We  will aggressively fluid resuscitate her until the CPKs start coming down.  No indication for dialysis at this time.  We will monitor her electrolytes  closely as well to assess for any hyperkalemia.  She is going to the OR  with Orthopedics today for her femoral fracture.  We will do a postop check  afterwards and restart diet and Lovenox as appropriate.  We will monitor  for any further bleeding from the fracture.  She will need aggressive  incentive spirometry and lung expansion.  We will try to wean off of her  oxygen.  We will try to clear her cervical spine if her exam permits.  She  will remain on bedrest while she is in traction.  We will follow-up  her  weight bearing status with Orthopedics after her fixation is done.  Finally, we will follow-up any additional recommendations from psychiatry.  They are still sorting out whether this is an underlying psychotic  disorder, if this is related to her meth use or there are other factors.  She will remain in the ICU given her rhabdomyolysis and need for ongoing  fluid resuscitation.          Unreviewed          DD: 07/20/2016 TD: 12:00 P   DT:  07/21/2016 12:51 P   DocNo.:  [id number]  AEB/sus    cc:

## 2016-07-21 NOTE — Progress Notes (Signed)
Critical Care Progress Note  Patient Name: 2  MRN: [id number]  Room#: GMW10/UVO53G    Service: Trauma Surgery Attending Provider: Mauri Brooklyn, MD    Date: 07/21/16  Hospital Day:   2 days - Admitted on: 07/19/2016  Post Operative Day(s): 1 Day Post-Op  Code status: .Full code / full care     #  Injury  Management  Date    left subtroch femurfracture Ortho: pending OR today 07/19/16    Rhabdo Q6h CPK and fluid rescus  10/28    Psychosis  Psych consult         SUBJECTIVE: 10 [name] is a 60 year old, domiciled female with history of unspecified mood d/o, opiate use d/o, and piror suspected malingering, who presented after a fall from a balcony with a L femur fracture, utox positive for amphetamines, THC, and benzodiazepines (after given versed in trauma bay).    Overnight Events:   -NAE overngiht  -Pt refusing all cares this morning    Past Medical History:   Diagnosis Date   . Amphetamine abuse    . Bipolar affective (CMS-HCC)    . Fracture, intertrochanteric, left femur (CMS-HCC)    . HLD (hyperlipidemia)    . HTN (hypertension)    . Obesity    . Opioid abuse        No prescriptions prior to admission.         OBJECTIVE:     Lines, Drains and Airways:  Patient Lines/Drains/Airways Status    Active PICC Line / CVC Line / PIV Line / Drain / Airway / Intraosseous Line / Epidural Line / ART Line / Line Type / Wound     Name: Placement date: Placement time: Site: Days:    CVC Triple Lumen - Right Femoral 07/19/16      Femoral   2    Indwelling Urinary Catheter -  07/20/16 0157 RN Standard (Latex) 10 ml Yes 07/20/16   0157   Standard (Latex)   1    Traumatic  Injury  Laceration Leg Left;Lower 07/19/16   2320   Leg   1    Traumatic  Injury  Laceration Nose 07/19/16   2320   Nose   1    Incision -  07/20/16 1249 Hip Left 07/20/16   1249    less than 1                  Vital signs:     Latest Entry  Range (last 24 hours)    Temperature: 97.7 F (36.5 C)  Temp  Avg: 97.6 F  (36.4 C)  Min: 97 F (36.1 C)  Max: 98.7 F (37.1 C)    Blood pressure (BP): 100/60  BP  Min: 75/47  Max: 124/69    Heart Rate: 95  Pulse  Avg: 75.5  Min: 66  Max: 95    Respirations: 16  Resp  Avg: 16.3  Min: 9  Max: 24    SpO2: 96 %  SpO2  Avg: 97.7 %  Min: 93 %  Max: 100 %       No Data Recorded       No Data Recorded       No Data Recorded     Weight: 104 kg (229 lb 4.5 oz)  Percentage Weight Change (%): 0 %    Recent Labs      07/21/16   0046  07/21/16   0527  WBC  9.9  11.5*   HGB  7.1  8.1   HCT  21.9  25.0   PLT  162  154   SEG  85  85   LYMPHS  6  7   MONOS  6  7      Recent Labs      07/20/16   0552   07/21/16   0046  07/21/16   0527   NA  146*   < >  139  140   K  3.5   < >  3.8  3.7   CL  107   < >  105  106   BICARB  25   < >  25  23   BUN  19   < >  11  10   CREAT  1.01   < >  0.85  0.83   GLU  110*   < >  128*  91   [place]  8.4   < >  7.5*  7.6*   MG  2.2   --    --   1.9   PHOS  3.6   --    --   2.5*    < > = values in this interval not displayed.     No results for input(s): ALK, AST, ALT, TBILI, DBILI, ALB in the last 72 hours.  Recent Labs      07/19/16   1925  07/21/16   0527   PT  11.8  13.5*   PTT  30.3   --    INR  1.1  1.2       Medications:  Scheduled Meds  . ceFAZolin (ANCEF) IV  1,000 mg Q8H   . docusate sodium  250 mg Nightly   . enoxaparin  40 mg Q12H   . famotidine  20 mg Q12H    Or   . famotidine  20 mg Q12H   . senna  2 tablet QAM     PRN Meds  . acetaminophen  650 mg Q4H PRN   . HYDROmorphone  0.5 mg Q1H PRN   . HYDROmorphone  0.5 mg Q1H PRN   . HYDROmorphone  1 mg Q1H PRN   . nalOXone  0.1 mg Q2 Min PRN   . OLANZapine  2.5 mg Q6H PRN     IV Meds  . sodium chloride 100 mL/hr at 07/21/16 0000       Allergies:  Allergies   Allergen Reactions   . Haldol [Haloperidol] Swelling   . Ondansetron Swelling   . Toradol [Ketorolac Tromethamine] Swelling   . Reglan [Metoclopramide] Unspecified   . Tramadol Unspecified       ASSESSMENT/PLAN:  10 [name] is a 60 year old,  domiciled female with history of unspecified mood d/o, opiate use d/o, and piror suspected malingering, who presented after a fall from a balcony with a L femur fracture, utox positive for amphetamines, THC, and benzodiazepines (after given versed in trauma bay).    Neuro:  GCS: .15  Mental status: AAOx3  Neuro exam: negative, Gait normal. Reflexes normal and symmetric. Sensation grossly normal  Pain Medications: dilaudid PRN  Sedation: olanzapine  Management of TBI, ICP, SCI (if applicable): NA  Other medications:     CV:  BP  Min: 75/47  Max: 124/69  Pulse  Avg: 75.5  Min: 66  Max: 95  Cardiac Exam: regular rate and rhythm  Pressors: none  Cardiac  medications: no  Echo:    Pulmonary:  Resp  Avg: 16.3  Min: 9  Max: 24  SpO2  Avg: 97.7 %  Min: 93 %  Max: 100 %    O2 Device: None (Room air)   Ventilator settings:    Pulmonary exam:clear to auscultation bilaterally  CXR:   Sprint trials:  Arterial Blood Gas result: No results for input(s): ARTPH, ARTPCO2, ARTPO2, ARTHC03, ARTBE, ARTO2SAT in the last 72 hours.                FEN/GI:  Abd exam: soft, non-tender. Bowel sounds normal. No masses,  no organomegaly  Diet:Diet Custom: Carb Limited (4 Carbs)  Bowel regimen:  Last BM:    RENAL:   Intake/Output       07/20/16 0600 - 07/21/16 0559 07/21/16 0600 - 07/22/16 0559      0350-0938 1829-9371 Total 0600-1759 6967-8938 Total       Intake    P.O.  600  340 940  --  -- --    I.V.  3367.5  3700 7067.5  200  -- 200    Total Intake 3967.5 4040 8007.5 200 -- 200       Output    Urine  285  465 750  60  -- 60    Total Output 285 465 750 60 -- 60       Net I/O     3682.5 3575 7257.5 140 -- 140        UOP last 3 hours: 750/24hrs. In 8L. +7.2L  Foley: yes    MUSCULOSKELETAL:  L hip fx s/p repair    ID:   Temp  Avg: 97.6 F (36.4 C)  Min: 97 F (36.1 C)  Max: 98.7 F (37.1 C)  Micro:   Abx: ancef post-op  MRSA screen:        Hematology:  Blood Products: none required    Endocrine:  FSBS: 91-128    Consults:   Ortho trauma:  NWB  LLE for now - need CT of L knee to assess for tibial plateau fracture, if negative will advance to wbat  Follow up right hip xray  2 doses of ancef 2 gmq8 hrs  4 weeks of lovenox  PT consult    Psych:  - start olanzapine 2.70m po q6h PRN agitation  - no scheduled antipsychotic recommended at this time  - psychiatry will re-evaluate tomorrow morning to evaluate whether psychosis persists, and to clarify circumstances of injury.  - lacks capacity to leave AMA at this time, anticipate capacity will be restored as intoxication resolves. Daughter is at bedside and is serving as sAmbulance personat this time.  - would recommend a sitter or DOU if moved out of ICU, due to fall risk    Prophylaxis:  GI:  DVT:  Last LE Doppler:    Feedings: yes  Analgesia: yes  Sedation: yes  Thromboprophylaxis: yes  Head-of-bed elevation: yes  Ulcer prophylaxis: yes  Glycemic control: yes  Spontaneous breathing trial: yes  Bowel care: yes  Indwelling catheter removal: yes  De-escalation of antibiotics: yes    Foley catheterization: yes  Restraints: yes  Family discussion: no      Plan/Recommendations:  -change fluids to LR  -f/u psych  -downgrade    Plan of care discussed with Attending Physician during rounds    AEdgardo Roys M.D.  Anesthesiology, PGY-2  [phone]

## 2016-07-21 NOTE — Interdisciplinary (Signed)
Pt. Refusing assessment, refused orientation questions, but "asking for the nursing director", when asked if she knew what year it was "I already answered that! Just leave me alone! What's your problem?"  I explained that I was trying to complete a neurological exam, and she turned away from me, closed her eyes and would not respond to anymore questions.

## 2016-07-21 NOTE — Progress Notes (Signed)
[facility] Progress Note on 10:32 PM     HPI:    40F w left low IT femur fx s/p IMN on 10/29.     Events:  No acute events.    Subjective:   Asking not to move hip     Objective:   Vital Signs:   Temperature:  [97 F (36.1 C)-98.7 F (37.1 C)] 97.7 F (36.5 C) (10/30 0330)  Blood pressure (BP): (75-124)/(44-88) 93/57 (10/30 0500)  Heart Rate:  [66-90] 81 (10/30 0500)  Respirations:  [9-24] 15 (10/30 0500)  Pain Score: 10 (10/30 0535)  O2 Device: None (Room air) (10/30 0400)  O2 Flow Rate (L/min):  [2 l/min-6 l/min] 2 l/min (10/29 2300)  SpO2:  [93 %-100 %] 94 % (10/30 0500)  Weights (last 14 days)     Date/Time Weight Who    07/20/16 0505 104 kg (229 lb 4.5 oz) CH              Intake/Output Summary (Last 24 hours) at 07/20/16 2232  Last data filed at 07/20/16 2100   Gross per 24 hour   Intake             5390 ml   Output             1160 ml   Net             4230 ml       Physical Exam:   General:  no acute distress  LLE  +TTP knee/ankle  Skin:  dressing c/d/i  Sensation: SILT s/s/sp/dp/t nerves  Motor:  3/5 ehl/fhl, fire ta/gsc  Peripheral vascular: palpable DP, foot wwp, BCR    Right knee with swelling and tenderness  +log roll  4/5 gsc/ta/ehl/fhl  Silt s/s/spn/dpn/t    LUE:   Left shoulder pain  Abrasion over wrist  3/5 motor ain/pin/ulnar    RUE:  Motor: Radial nerve intact, non compliant with ain/ulnar exam  Sensory; grossly intact at hand          Laboratory Data:    Lab Results   Component Value Date    WBC 9.9 07/21/2016    HGB 7.1 07/21/2016    HCT 21.9 07/21/2016    PLT 162 07/21/2016     Lab Results   Component Value Date    NA 139 07/21/2016    K 3.8 07/21/2016    CL 105 07/21/2016    BICARB 25 07/21/2016    BUN 11 07/21/2016    CREAT 0.85 07/21/2016    GLU 128 07/21/2016    [place] 7.5 07/21/2016     Lab Results   Component Value Date    INR 1.1 07/19/2016    PTT 30.3 07/19/2016         Assessment: 40F w low left IT femur fx s/p IMN on 10/29 doing well post-operatively.     Plan:       NWB LLE for now - need CT of L knee to assess for tibial plateau fracture, if negative will advance to wbat  Follow up right hip xray  2 doses of ancef 2 gm q8 hrs  4 weeks of lovenox  PT consult  Ortho will follow, dressing change 2 days postpop  pgr [phone]

## 2016-07-21 NOTE — Interdisciplinary (Signed)
Report called to Marchelle FolksAmanda, RN MICU 2. VSS. Transported by RN, Tech in patient bed

## 2016-07-21 NOTE — Plan of Care (Signed)
Problem: Altered thought process as evidenced by psychosis/mood/cognitive dysfunction  Goal: Reduction and/or stabilization of target symptoms  Outcome: Unable to meet goal at this time.  Agitated and non-compliant through out shift, extensive redirection and attempts to calm patient by myself, MD , and patient's daughter, Beckie Busing. Ativan given with some reduction of anxiety, but because of hypotension unable to give more anti-anxiety meds, Precedex discontinued mid-shift d/t hypotension; pt continues to have flight of thoughts, skipiing from one train of thought to another mid-sentence; agitation seemed somewhat reduced once c-collar was d/c'ed and patient daughter returned home    Problem: Pain - Acute  Goal: Communication of presence of pain  Outcome: Goal Met  Continually states she is in pain        Problem: Mobility - Impaired  Goal: Able to perform range-of-motion exercises independently  Outcome: Progressing toward goal, anticipate improvement over: next 12-24 hours

## 2016-07-22 ENCOUNTER — Encounter (HOSPITAL_COMMUNITY): Payer: Self-pay | Admitting: Orthopaedic Surgery

## 2016-07-22 DIAGNOSIS — M1712 Unilateral primary osteoarthritis, left knee: Secondary | ICD-10-CM

## 2016-07-22 DIAGNOSIS — Z981 Arthrodesis status: Secondary | ICD-10-CM

## 2016-07-22 LAB — CBC WITH DIFF, BLOOD
ANC-Automated: 9.7 10*3/uL — ABNORMAL HIGH (ref 1.6–7.0)
Abs Eosinophils: 0.3 10*3/uL (ref 0.1–0.5)
Abs Lymphs: 0.9 10*3/uL (ref 0.8–3.1)
Abs Monos: 0.9 10*3/uL — ABNORMAL HIGH (ref 0.2–0.8)
Eosinophils: 2 %
Hct: 24.4 %
Hgb: 8.1 gm/dL
Lymphocytes: 8 %
MCH: 27.1 pg (ref 26.0–32.0)
MCHC: 33.2 g/dL (ref 32.0–36.0)
MCV: 81.6 um3 (ref 79.0–95.0)
MPV: 9.8 fL (ref 9.4–12.4)
Monocytes: 8 %
Plt Count: 169 10*3/uL (ref 140–370)
RBC: 2.99 10*6/uL
RDW: 17.8 % — ABNORMAL HIGH (ref 12.0–14.0)
Segs: 82 %
WBC: 11.9 10*3/uL — ABNORMAL HIGH (ref 4.0–10.0)

## 2016-07-22 LAB — MAGNESIUM, BLOOD: Magnesium: 1.9 mg/dL (ref 1.7–2.3)

## 2016-07-22 LAB — CPK-CREATINE PHOSPHOKINASE, BLOOD
CPK: 11212 U/L (ref 0–175)
CPK: 10879 U/L (ref 0–175)

## 2016-07-22 LAB — PHOSPHORUS, BLOOD: Phosphorous: 1.8 mg/dL — ABNORMAL LOW (ref 2.7–4.5)

## 2016-07-22 LAB — PROTHROMBIN TIME, BLOOD
INR: 1.2
PT,Patient: 12.5 s (ref 9.7–12.5)

## 2016-07-22 LAB — BASIC METABOLIC PANEL, BLOOD
Anion Gap: 12 mmol/L (ref 7–15)
BUN: 6 mg/dL — ABNORMAL LOW (ref 8–23)
Bicarbonate: 22 mmol/L (ref 22–29)
Calcium: 7.9 mg/dL — ABNORMAL LOW (ref 8.2–9.6)
Chloride: 106 mmol/L (ref 98–107)
Creatinine: 0.77 mg/dL
GFR: >60 mL/min
Glucose: 80 mg/dL (ref 70–99)
Potassium: 3.8 mmol/L (ref 3.5–5.1)
Sodium: 140 mmol/L (ref 136–145)

## 2016-07-22 MED ORDER — LORAZEPAM 2 MG/ML IJ SOLN
.50 mg | Freq: Once | INTRAMUSCULAR | Status: AC
Start: 2016-07-22 — End: 2016-07-22
  Administered 2016-07-22: 0.5 mg via INTRAVENOUS
  Filled 2016-07-22: qty 1

## 2016-07-22 NOTE — Plan of Care (Signed)
Trauma surgery attending at bedside. Stated no more Q6 hours labs needed. MD will DC orders.

## 2016-07-22 NOTE — Interdisciplinary (Signed)
OTContact       07/22/16 1317    Therapy Contact Note    Therapy not provided at this time as Patient/family/caregiver declined treatment.    Additional Comments Attempted to see pt. this afternoon, pt. declined therapy at this time. Will attempt to see pt. at a later time.

## 2016-07-22 NOTE — Progress Notes (Addendum)
[facility] Progress Note on 10:32 PM     HPI:    69F w left low IT femur fx s/p IMN on 10/29.     Subjective:   Shoulder feels better, endorses prior shoulder surgery, likely distal clavicle excision    Objective:   Vital Signs:   Temperature:  [97.7 F (36.5 C)-98.6 F (37 C)] 98.5 F (36.9 C) (10/31 0400)  Blood pressure (BP): (90-125)/(48-81) 116/75 (10/31 0600)  Heart Rate:  [74-95] 85 (10/31 0600)  Respirations:  [12-22] 17 (10/31 0600)  Pain Score: Patient Sleeping, Respiratory Assessment Done (10/31 0600)  O2 Device: None (Room air) (10/31 0600)  O2 Flow Rate (L/min):  [2 l/min] 2 l/min (10/30 1215)  SpO2:  [92 %-98 %] 97 % (10/31 0600)  Weights (last 14 days)     Date/Time Weight Who    07/20/16 0505 104 kg (229 lb 4.5 oz) CH              Intake/Output Summary (Last 24 hours) at 07/20/16 2232  Last data filed at 07/20/16 2100   Gross per 24 hour   Intake             5390 ml   Output             1160 ml   Net             4230 ml       Physical Exam:   General:  no acute distress  LLE  +TTP knee/ankle  Skin:  No active drainage, staples in place, dressing changed 10/31  Sensation: SILT s/s/sp/dp/t nerves  Motor:  3/5 ehl/fhl, fire ta/gsc  Peripheral vascular: palpable DP, foot wwp, BCR    Right knee with swelling and tenderness  +log roll  4/5 gsc/ta/ehl/fhl  Silt s/s/spn/dpn/t    LUE:   Left shoulder pain - improving  Abrasion over wrist  3/5 motor ain/pin/ulnar    Laboratory Data:    Lab Results   Component Value Date    WBC 11.9 07/22/2016    HGB 8.1 07/22/2016    HCT 24.4 07/22/2016    PLT 169 07/22/2016     Lab Results   Component Value Date    NA 140 07/22/2016    K 3.8 07/22/2016    CL 106 07/22/2016    BICARB 22 07/22/2016    BUN 6 07/22/2016    CREAT 0.77 07/22/2016    GLU 80 07/22/2016    [place] 7.9 07/22/2016     Lab Results   Component Value Date    INR 1.2 07/22/2016         Assessment: 69F w low left IT femur fx s/p IMN on 10/29 doing well post-operatively.     Plan:    NWB  LLE for now - need CT of L knee to assess for tibial plateau fracture, if negative will advance to wbat, knee immobilizer   No new films needed for left shoulder, prior shoulder surgery likely distal clavicle excision   2 doses of ancef 2 gm q8 hrs  4 weeks of lovenox  PT consult  pgr [phone]    Addendum:  Reviewed CT scan - effusion, endstage osteoarthritis, no fracture.     Addended plan - WBAT LLE, no knee immobilizer needed, ROMAT left knee. Compressive wrap, ice to knee. (progressive mobility protocol ordered was changed to reflect change in activity recommendations).     Thane EduHassan J. Garrison ColumbusAzimi, MD  Orthopaedic Surgery Resident, PGY-5  Pager: [phone]
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
## 2016-07-22 NOTE — Interdisciplinary (Signed)
Occupational Therapy Evaluation    Today:  : As per chart: 22F w left low IT femur fx s/p IMN on 10/29, WBAT on LLE. Pt. seen for initial evaluation. Pt. required MaxA x 2 to transitioned from supine to EOB. BUE AROM WFL, UBD subtested with ROM. TotalA to don B socks. MaxA x 2 to transition from sitting to standing with FWW, pt. agitated, yelling and screaming all throughout session. Pt. able to stand 2 times with MaxA, encouragement and education for body mechanics and sequencing. Pt. is at high risk of falling due to impulsivity. MaxA x 3-4 to returned back to bed due to resistance/behavior. Pt. left supine, all needs met at end of assessment.      Assessment: Pt. demonstrated fair tolerance for OOB activity,  poor safety awareness during assessment, agitated and impulsive with poor acceptance of redirection. Pt. currently requires 2 person assist for OOB. Functional deficits present include pain, impaired mobility, decreased ability with LB ADLs and behavioral disturbances. Pt. will benefit from skilled OT to maximize independence. If pt. d/c today, recommend placement as she is at high fall risk and requires extensive assist at this time.     Recommendations: Pt. will benefit from skilled OT to maximize independence. If pt. d/c today, recommend placement as she is at high fall risk and requires extensive assist at this time.       Current Functional StatusBoston AM-PAC: Daily Activity  Assistance Needed to Put on and Take off Regular Lower Body Clothing: Total (Dependent)  Assistance Needed to Bathe, Including Washing, Rinsing, and Drying: A lot (Mod/Max Assist)  Assistance Needed to Toilet [employer], Bedpan, or Urinal): Total (Dependent)  Assistance Needed to Put on and Take off Regular Upper Body Clothing: A lot (Mod/Max Assist)  Assistance Needed to Take Care of Personal Grooming Such as Brushing Teeth: A lot (Mod/Max Assist)  Assistance Needed to Eat Meals: A lot (Mod/Max Assist)  AM-PAC Daily Activity  Total Score: 10  Assessment: AM-PAC Daily Activity - Current Functional Impairment Level:  CL: 60-79% impaired    Post Acute Discharge Considerations  Anticipated Required Level of Cognitive Assistance: Most of the time  Anticipated Required Level of Safety Assistance: Constant supervision  Anticipated Required Level of Self Care Assistance: All of the time  Anticipated Physical Barriers Impacting Discharge: Multi-level home    Occupational Therapy Patient Discharge Instructions  Your Occupational Therapist Suggests the Following: Continue to complete your self care Activities of Daily Living as frequently as possible;Continue to follow your prescribed mobility precautions when transferring to the chair and toilet as instructed  Supervision is suggested for : Bathe;Dress;Toilet    Referring Physician: Self, Referred           Inpatient    Visit Diagnoses       Codes    Difficulty walking     ICD-10-CM: R26.2  ICD-9-CM: 719.7          Start of Care: 07/22/16  Reason for Referral: Decline in cognitive status;Decline in functional ability;Decline in functional mobility;Difficulty with activities of daily living     Preferred Language:English    Assessment   Assessment: Pt. demonstrated fair tolerance for OOB activity,  poor safety awareness during assessment, agitated and impulsive with poor acceptance of redirection. Pt. currently requires 2 person assist for OOB. Functional deficits present include pain, impaired mobility, decreased ability with LB ADLs and behavioral disturbances. Pt. will benefit from skilled OT to maximize independence. If pt. d/c today, recommend placement as she is  at high fall risk and requires extensive assist at this time.   Potential to improve with therapy: Good     Plan  The plan of care was developed in conjunction with the patient's goals. It was reviewed with the patient, including a review of the physical findings, proposed treatment, frequency and duration of treatment sessions,  precautions, limitations and expected outcomes. The patient acknowledged understanding of all of the above and agreed to the treatment plan as stated.    Patient Goals :Patient Goals  Patient Stated Goal: "To leave this place".       Therapy Goals                                                                                        Current Level Goals for Episode of Care    Functional Deficit     Long term Functional Goal      Impairment #1 ADL function Short Term Goal #1 Impairment: ADL function  Custom Goal 1: Pt. will complete LBD with MinA using adaptive techniques/equipment as needed.   Number of Visits: 5-7  Goal Status: New   Impairment #2 ADL function Short Term Goal #2 Impairment: ADL function  Custom Goal 2: Pt. will complete BSC transfer with MinA using FWW.   Number of Visits: 5-7  Goal Status: New   Impairment #3 ADL function Short Term Goal #3 Impairment: ADL function  Custom Goal 3: Pt. will complete toileting tasks with MinA  Number of Visits: 5-7   Impairment #4   Short Term Goal #4     Functional Limitation Reporting    Rationale: Acceptable functional assessment;Clinical judgement;Patient history and medical status  Functional Assessment Tool: AM-PAC- 6 Clicks Daily Activity Inpatient  Visit Type: Initial ongoing  Impairment Category - Eval/Progress: Self Care  Self Care Current Status (J8250): CL 60%-79% impaired, limited or restricted  Self Care Goal Status (N3976): CJ 20-39% impaired, limited or restricted  Functional Assessment Tool  Rationale: Acceptable functional assessment;Clinical judgement;Patient history and medical status  Functional Assessment Tool: AM-PAC- 6 Clicks Daily Activity Inpatient  Assessment: AM-PAC Daily Activity - Current Functional Impairment Level:  CL: 60-79% impaired          Planned Therapy Interventions and Rationale  Patient Education: to improve energy conservation measures during functional activities;to improve compliance with independent home exercise  program;to increase independence in functional activities;to increase independence with correct body mechanics technique during functional activities;to improve parent/caregiver proficiency with developmental interventions for child;to increase knowledge of purpose of home exercise program;to increase knowledge of precautions to prevent/minimize complications of condition;to increase safety during dynamic activities;to minimize re-injury  Self-Care/ADL Training: to improve home safety;to improve energy conservation measures during functional activities;to improve independence with compensatory strategies;to improve independence with adaptive equipment;to improve safety when completing daily activities and self care;to improve safety while using an assistive device  Therapeutic Activities: to improve ability to change body position;to improve functional mobility;to improve independence with self care and daily activities;to improve navigating home and/or community;to improve transfers between surfaces  Therapeutic Exercise: to improve independence with functional mobility skills;to improve independence with self care and daily activities;to improve posture;to  increase range of motion;to increase strength     Inpatient Treatment Plan  Frequency of treatment : 4 times per week  Duration of Treatment: 14 visits  Status of Treatment: Patient evaluated and will benefit from ongoing skilled therapy  Treatment Plan Discussion  Include in My Healthcare: Myself;My healthcare team  Treatment Plan Discussion & Agreement: Patient  Patient/Family Questions: Yes - All questions asked & answered  Patient/Family Education  Learner(s): Patient  Patient/Family Training in Appropriate Therapeutic Interventions: Ongoing;Teaching reinforced;No evidence of learning  Education Topic(s): ADL/Self Care  Assessment  Assessment: Pt. demonstrated fair tolerance for OOB activity,  poor safety awareness during assessment, agitated and impulsive  with poor acceptance of redirection. Pt. currently requires 2 person assist for OOB. Functional deficits present include pain, impaired mobility, decreased ability with LB ADLs and behavioral disturbances. Pt. will benefit from skilled OT to maximize independence. If pt. d/c today, recommend placement as she is at high fall risk and requires extensive assist at this time.   Potential to improve with therapy: Good  Focus for Next Treatment  Focus for Next Treatment: ADL retraining;Functional mobility training for ADLs;Safety instruction    Treatment Time  Treatment Start Time: 2440  Total TIMED Treatment (min): 30 (25)  Total Treatment Time (min): 35  Inpatient Patient Safety Considerations  Patient Call Light Within Reach: Yes  Patient in Bed at the End of Treatment In: Supine position  Feet Positioned in Neutral and Heels Floating Using Appropriate Device: Yes, nursing notified of proper position  The Patient May Be at Risk for Falls: Yes, notified nursing  Post Acute Discharge Considerations  Anticipated Required Level of Cognitive Assistance: Most of the time  Anticipated Required Level of Safety Assistance: Constant supervision  Anticipated Required Level of Self Care Assistance: All of the time  Anticipated Physical Barriers Impacting Discharge: Multi-level home  Occupational Therapy Patient Discharge Instructions  Your Occupational Therapist Suggests the Following: Continue to complete your self care Activities of Daily Living as frequently as possible;Continue to follow your prescribed mobility precautions when transferring to the chair and toilet as instructed  Supervision is suggested for : Bathe;Dress;Toilet  Therapy Plan Communication  Therapy Plan Communication: Discussed therapy plan with Nursing    Patient History   Medical History  Dominant side: Right  History of presenting condition: As per chart: 38F w left low IT femur fx s/p IMN on 10/29, WBAT on LLE.   Fall History: Reported fall in the last 6  months, injury sustained  Past Medical History:   Diagnosis Date   . Amphetamine abuse    . Bipolar affective (CMS-HCC)    . Fracture, intertrochanteric, left femur (CMS-HCC)    . HLD (hyperlipidemia)    . HTN (hypertension)    . Obesity    . Opioid abuse      Current Facility-Administered Medications   Medication   . acetaminophen (TYLENOL) tablet 650 mg   . amLODIPINE (NORVASC) tablet 2.5 mg   . diphenhydrAMINE (BENADRYL) injection 25 mg   . docusate sodium (COLACE) capsule 250 mg   . enoxaparin (LOVENOX) injection 40 mg   . HYDROmorphone 10 mg in sodium chloride 0.9% (DILAUDID) PCA (0.2 mg/mL)   . lansoprazole (PREVACID) DR capsule 30 mg   . nalOXone (NARCAN) injection 0.1 mg   . OLANZapine (ZYPREXA ZYDIS) disintegrating tablet 2.5 mg   . pregabalin (LYRICA) capsule 200 mg   . senna (SENOKOT) tablet 17.2 mg   . sodium chloride 0.9% infusion  Functional History  Prior Level of Function: No deficits  Communication : No communication impairment  Instrumental Activities of Daily Living : Independent with IADLs  Functional Mobility Assistance Needs: Independent with mobility using assistive device  General ADL/Self-Care Assistance Needs: None- Independent with ADLs and self care  Equipment currently present/required in the home: Mobility Equipment/Device(s  Assistive Device required for Mobility: Front wheeled walker  Other Information: Pt. is confused, oriented to self only, unsure of accuracy of information as pt. contradicted her PLOF multiple times during session.     Social History  Living Situation: Lives alone  Home Environment: [place] accessibility : Stairs present  Number of stairs to enter home: 6-7  Bathroom Accessibility: Tub/shower combo    Subjective                                  Objective        OT Functional       07/22/16 1500    Inpatient Precautions/Contraindications    Precautions Fall;Behavioral;Line    Behavioral Combative;Elopement risk    Fall Bed/chair alarm    Line  Catheter precautions    Equipment/Lines Currently Present at Bedside Lines/drains/airways    Lines/Drains/Airways Telemetry;IV;Catheter      07/22/16 1500    [place] AM-PAC: Daily Activity    Assistance Needed to Put on and Take off Regular Lower Body Clothing 1    Assistance Needed to Bathe, Including Washing, Rinsing, and Drying 2    Assistance Needed to Toilet [employer], Bedpan, or Urinal) 1    Assistance Needed to Put on and Take off Regular Upper Body Clothing 2    Assistance Needed to Take Care of Personal Grooming Such as Brushing Teeth 2    Assistance Needed to Eat Meals 2    AM-PAC Daily Activity Total Score 10    Assessment: AM-PAC Daily Activity - Current Functional Impairment Level Score 9-13      07/22/16 1500    Activity Tolerance    General Activity Tolerance Fair - tolerates 50-74% of treatment session      07/22/16 1500    Cognition Assessment    Cognition Restrictions Cognitive deficits present    Overall Cognitive Status Impaired    Behavior Agitated;Combative;Impulsive    Orientation Level Oriented to person    Problem Solving Assistance required to implement solutions    Safety Awareness Poor awareness of safety precautions      07/22/16 1500    Coordination/Motor Control Assessment    Coordination/Motor Control Restrictions No limitations or impairments noted. Movement patterns are fluid and coordinated throughout      07/22/16 1500    Extremity Assessment - General    Extremity Restrictions Flexibility, strength and tone grossly within functional limits throughout      07/22/16 1500    Mobility for Daily Activities    Rolling Assistance level    Assistance Level Maximum assistance (50-75% assistance)    Sit to Supine Assistance level    Assistance Level Maximum assistance (50-75% assistance)    Sit to Stand Assistance level    Assistance Level Maximum assistance (50-75% assistance)    Stand to Sit Assistance level    Assistance Level Moderate assistance (25-50% assistance)    Functional Mobility  Detailed Comments MaxA x 2 for all mobility, used FWW.       07/22/16 1500    Balance for Daily Activities    Balance  Restrictions Balance limitations present      07/22/16 1500    Static Sitting Balance    Static Sitting Balance - Control Good - able to maintain balance without handhold support, limited postural sway      07/22/16 1500    Dynamic Sitting Balance    Dynamic Sitting Balance - Control Good - accepts moderate challenge, able to maintain balance while picking object off floor      07/22/16 1500    Static Standing Balance    Static Standing Balance - Control Fair - able to maintain balance with handhold support, may require occasional minimal assistance                              Treatment Today   Type of Eval  Low Complexity [phone]): Completed  Therapeutic Procedures  Therapeutic Activities [phone]): Assistance/facilitation of bed mobility;Dynamic activities to improve performance of functional tasks/activities;Facilitation of safety awareness/responses during functional tasks      Total TIMED Treatment (min): 25

## 2016-07-22 NOTE — Progress Notes (Signed)
Progress Note  White/Trauma    Patient Name: Alexandria Proctor  MRN: [id number]  Room#: MICU01/MICU01  Mechanism of Injury: fall from balcony.    Hospital Day:   3 days - Admitted on: 07/19/2016 Post-Injury Day: 3 Service: Trauma Surgery    #  Injury  Management  Date   1 left subtroch femurfracture Ortho: s/p repair 07/20/16   2 Rhabdo Q6h CPK and fluid rescus  10/28   3 Psychosis  Psych consult       60Fwho fell from a balcony while trying to cross to a lower balcony. Repeats "I'm not crazy"    SUBJECTIVE: Leg hurst, but is feeling better than yesterday.    Events/Complaints: Transferred from SICU.    OBJECTIVE:    Pain score: 0    Vital Signs:     Latest Entry  Range (last 24 hours)    Temperature: 98.5 F (36.9 C)  Temp  Avg: 98.1 F (36.7 C)  Min: 97.7 F (36.5 C)  Max: 98.6 F (37 C)    Blood pressure (BP): 116/75  BP  Min: 90/51  Max: 125/57    Heart Rate: 85  Pulse  Avg: 84  Min: 74  Max: 90    Respirations: 17  Resp  Avg: 17.2  Min: 12  Max: 22    SpO2: 97 %  SpO2  Avg: 96.1 %  Min: 92 %  Max: 98 %     Weight: 104 kg (229 lb 4.5 oz)  Percentage Weight Change (%): 0 %    10/30 0600 - 10/31 0559  In: 1478 [P.O.:800; I.V.:2475]  Out: 1128 [Urine:1128]  Bowel movement: PTA    Physical:  General Appearance: healthy, alert, no distress, pleasant affect, cooperative, skin warm, dry, and pink.  Heart:  normal rate and regular rhythm, no murmurs.  Lungs: clear to auscultation, no chest deformities noted.  Abdomen: Abdomen soft, non-tender. No masses or organomegaly. Bowel sounds normal.  Extremities:  no cyanosis, clubbing, or edema and distal pulses normal.  LLE in knee immobilizer.  Wiggles toes, CR < 2 sec.  OR dressings C/D/I.  Left chin ecchymosis.    Glasgow Coma Scale Score: 14    Labs:     CBC  Recent Labs      07/21/16   0527  07/22/16   0323   WBC  11.5*  11.9*   HGB  8.1  8.1   HCT  25.0  24.4   PLT  154  169   SEG  85  82   LYMPHS  7  8   MONOS  7  8        Chemistry  Recent Labs      07/21/16   0527   07/21/16   2250  07/22/16   0323   NA  140   < >  138  140   K  3.7   < >  3.5  3.8   CL  106   < >  105  106   BICARB  23   < >  24  22   BUN  10   < >  6*  6*   CREAT  0.83   < >  0.79  0.77   GLU  91   < >  90  80   Olga  7.6*   < >  7.7*  7.9*   MG  1.9   --    --   1.9  PHOS  2.5*   --    --   1.8*    < > = values in this interval not displayed.     No results for input(s): ALK, AST, ALT, TBILI, DBILI, ALB in the last 72 hours.       Coags  Recent Labs      07/19/16   1925  07/21/16   0527  07/22/16   0323   PT  11.8  13.5*  12.5   PTT  30.3   --    --    INR  1.1  1.2  1.2       Toxicology:  Lab Results   Component Value Date    ETOH <11 07/19/2016    BARBCLASS Negative 07/20/2016    BENZDIAZCL Pending confirm 07/20/2016    BENZYLCGNN Negative 07/20/2016    METHADONE Negative 07/20/2016    OPIATESCL Negative 07/20/2016    OXY Negative 07/20/2016    PHENCYCLDN Negative 07/20/2016    TETCANNABIN Pending confirm 07/20/2016       Radiology:   07/21/16 BLE duplex: Neg for DVT    Medications:  Scheduled Meds  . acetaminophen  650 mg Once   . amLODIPINE  2.5 mg QAM WC   . docusate sodium  250 mg Nightly   . enoxaparin  40 mg Q12H   . lansoprazole  30 mg QAM AC   . LORazepam  0.5 mg Once   . pregabalin  200 mg Daily   . senna  2 tablet QAM     PRN Meds  . acetaminophen  650 mg Q4H PRN   . diphenhydrAMINE  25 mg Q6H PRN   . nalOXone  0.1 mg Q2 Min PRN   . OLANZapine  2.5 mg Q6H PRN       Smoking History:    reports that he has been smoking.  He does not have any smokeless tobacco history on file.    ASSESSMENT / PLAN:    Alexandria Proctor year old female admitted s/p fall from balcony.    Ortho (07/22/16):  60F w low left IT femur fx s/p IMN on 10/29 doing well post-operatively.   Plan:    NWB LLE for now - need CT of L knee to assess for tibial plateau fracture, if negative will advance to wbat, knee immobilizer   No new films needed for left shoulder, prior shoulder surgery likely distal clavicle excision   2 doses of ancef 2  gmq8 hrs  4 weeks of lovenox  PT consult    Psychiatry (07/21/16):  Amphetamine intoxication, resolving  Unspecified mood disorder, by history  Opioid use disorder, by history  Recommendations:  - This patient has a chart history of opioid use disorder and may therefore have tolerance to opioid medications. Uncontrolled pain appears to be the primary source of patient's agitation today. If primary team unable to get adequate pain control for this patient in context of opioid use disorder history, consider a pain management consult.  - It does not appear that psychosis is the driving force behind agitation at this point. She appears to be recovering from amphetamine intoxication. Decrease frequency of olanzapine PRN to 2.24m po BID PRN agitation.  - Legal status: 5150 DTS exp 07/22/16 @ 18:30. Will re-evaluate tomorrow for discontinuation.  - Level of observation: ICU  - Psychiatric follow-up plan: Psychiatry consult service will re-evaluate tomorrow morning to make further management recommendations.    Trauma:  -cont po diet, pain meds  -CT  left knee  -f/u Ortho recs post CT  -PT/OT when allowed [place], NP

## 2016-07-22 NOTE — Interdisciplinary (Signed)
Physical Therapy Evaluation      Current Functional StatusBoston AM-PAC: Basic Mobility  Assistance Needed to Turn from Back to Side While in a Flat Bed Without Using Bedrails: A lot (mod/max assist)  Difficulty with Supine to Sit Transfer: A lot (mod/max assist)  How Much Help Needed to Move to/from Bed to Chair: A lot (mod/max assist)  Difficulty with Sit to Stand Transfer from Chair with Arms: A lot (mod/max assist)  How Much Help Needed to Walk in Room: Total (dependent)  How Much Help Needed to Climb 3-5 Steps with a Rail: Total (dependent)  AMPAC Total Score: 10  Assessment: AM-PAC Basic Mobility - Current Functional Impairment Level: CL: 60-79% impaired    Post Acute Discharge Considerations  Anticipated required level of cognitive assistance: None  Anticipated required level of safety assistance: Occasional close supervision  Anticipated required level of physical assistance: None  Anticipated  level of therapy tolerance and ongoing therapy needs: Would benefit from intermittent Physical Therapy intervention post acute discharge to maximize functional independence  Equipment recommendations : Front Con-way    Physical Therapy Patient Discharge Instructions  Your Physical Therapist suggests the following: Continue to follow your prescribed mobility precautions when moving in and out of bed and walking  as instructed    Referring Physician: Self, Referred           Inpatient    Visit Diagnoses       Codes    Difficulty walking     ICD-10-CM: R26.2  ICD-9-CM: 719.7          Start of Care: 07/22/16  Onset/Admission date : 07/19/2016  Reason for referral: Decline in functional mobility;Activity tolerance limitation;Pain;Safety/judgement impairment;Strength impairment     Preferred Language:English    Plan  The plan of care was developed in conjunction with the patient's goals. It was reviewed with the patient, including a review of the physical findings, proposed treatment, frequency and duration of  treatment sessions, precautions, limitations and expected outcomes. The patient acknowledged understanding of all of the above and agreed to the treatment plan as stated.    Patient Goals :     Therapy Goals                                                                                        Current Level Goals for Episode of Care    Functional Deficit Functional Mobility   Long term Functional Goal  Functional deficit summary: Functional Mobility  Goal (Long Term) : Pt able to ascend and descent 6-7 stairs with CGA, in order to safely enter apartment.   No. visits: 10-14  Goal status: New   Impairment #1 Functional Mobility Short Term Goal #1 Impairment: Functional Mobility  Custom goal: Pt able to complete bed mobility skills, supine to sit and sit to supine with CGA, in order to get out of bed on her own.  No. visits: 1-3  Goal status: New   Impairment #2 Gait Short Term Goal #2 Impairment : Gait  Custom goal: Pt able to ambulate 45ft CGA with FWW, in order to independently ambulate at home.  No. visits: 3-5  Goal  status: New   Impairment #3 Functional Mobility Short Term Goal #3 Impairment : Functional Mobility  Functional mobility : Patient able to complete sit to stand transition from bed to chair with  Assistance : minimum assistance  No. visits: 3-5  Goal status: New         Functional Limitation Reporting    Rationale: Acceptable functional assessment;Clinical judgement  Functional Assessment Tool: AM-PAC- 6 Clicks Basic Mobility Inpatient  Visit type: Initial ongoing  Impairment Category - One time only: Mobility  Impairment Category: Mobility  Mobility: Walking and Moving Around Current Status [phone](G8978): CL 60%-79% impaired, limited or restricted  Mobility: Walking and Moving Around Goal Status [phone](G8979): CJ 20-39% impaired, limited or restricted  Functional Assessment Tool  Rationale: Acceptable functional assessment;Clinical judgement  Functional Assessment Tool: AM-PAC- 6 Clicks Basic Mobility  Inpatient  Assessment: AM-PAC Basic Mobility - Current Functional Impairment Level: CL: 60-79% impaired        Inpatient Treatment Plan  Frequency of treatment: 5 times per week  Duration of treatment: 10 visits  Status of treatment: Patient evaluated and will benefit from ongoing skilled therapy  Assessment   Assessment : Pt is a 60 year old, domiciled female with history of unspecified mood d/o, opiate use d/o, BIB medics on 5150 DTS following a fall from a balcony with a L femur fracture, s/p L IMN. At Baseline: Pt reported living alone in 2nd story apartment (6-7 stairs). Pt reported using FWW at home for ambulation.  Pt was independent with all ADL's prior to admit. A&O x1, therefore need to clarify.  Today: Pt was confused and become agitated with treatment.  Pt reported increased pain in L LLE despite PCA pump.  Pt had difficulty following two step commands.  Pt needed ModA to MaxA with bed mobility.  She was able to do 2 sit to stands with MaxA x2.  Recommendations: Skilled therapy is necessary to increase strength, endurance, and activity tolerance.  Pt ok for ROMAT with LLE no knee immobilizer necessary, per update from MD.  Recommend FWW for ambulation.      Rehab Potential: Good  Focus for Next Treatment  Focus for Next Treatment: ADL retraining;Functional mobility training for ADLs;Safety instruction  Treatment Time  Treatment Start Time: 1347  Total TIMED Treatment (min): 30 (25)  Total Treatment Time (min): 35  Inpatient Patient Safety Considerations  Patient call light within reach: Yes  Patient in bed at the end of treatment in: Supine position  Feet positioned in neutral and heels floating using appropriate device.: Yes, Nursing notified of proper position  Patient requires for safe ambulation: Front wheeled walker  The patient may be at risk for falls: Yes- Notified Nursing  Family/caregiver present for treatment: None Present  Post Acute Discharge Considerations  Anticipated required level of  cognitive assistance: None  Anticipated required level of safety assistance: Occasional close supervision  Anticipated required level of physical assistance: None  Anticipated  level of therapy tolerance and ongoing therapy needs: Would benefit from intermittent Physical Therapy intervention post acute discharge to maximize functional independence  Equipment recommendations : Front Con-wayWheeled Walker  Physical Therapy Patient Discharge Instructions  Your Physical Therapist suggests the following: Continue to follow your prescribed mobility precautions when moving in and out of bed and walking  as instructed  Therapy Plan Communication  Therapy Plan Communication: Discussed therapy plan with Nursing    Patient History   Medical History  Dominant side: Right  History of presenting condition: Pt is a  60 year old, domiciled female with history of unspecified mood d/o, opiate use d/o, BIB medics on 5150 DTS following a fall from a balcony with a L femur fracture. Status post L IMN.   Mechanism of Injury: Insidious onset  Fall history: No reported falls in the last 6 months  Past Medical History:   Diagnosis Date   . Amphetamine abuse    . Bipolar affective (CMS-HCC)    . Fracture, intertrochanteric, left femur (CMS-HCC)    . HLD (hyperlipidemia)    . HTN (hypertension)    . Obesity    . Opioid abuse      Current Facility-Administered Medications   Medication   . acetaminophen (TYLENOL) tablet 650 mg   . amLODIPINE (NORVASC) tablet 2.5 mg   . diphenhydrAMINE (BENADRYL) injection 25 mg   . docusate sodium (COLACE) capsule 250 mg   . enoxaparin (LOVENOX) injection 40 mg   . HYDROmorphone 10 mg in sodium chloride 0.9% (DILAUDID) PCA (0.2 mg/mL)   . lansoprazole (PREVACID) DR capsule 30 mg   . nalOXone (NARCAN) injection 0.1 mg   . OLANZapine (ZYPREXA ZYDIS) disintegrating tablet 2.5 mg   . pregabalin (LYRICA) capsule 200 mg   . senna (SENOKOT) tablet 17.2 mg   . sodium chloride 0.9% infusion       General Health Screen   Fall  history: No reported falls in the last 6 months    Functional History  Prior Level of Function: No deficits  Communication : No communication impairment  Instrumental Activities of Daily Living : Independent with IADLs  Functional Mobility Assistance Needs: Independent with mobility using assistive device  General ADL/Self-Care Assistance Needs: None- Independent with ADLs and self care  Equipment currently present/required in the home: Mobility Equipment/Device(s  Assistive Device required for Mobility: Front wheeled walker  Other Information: Pt. is confused, oriented to self only, unsure of accuracy of information as pt. contradicted her PLOF multiple times during session.     Safety Measures  Functional Mobility Assistance Needs: Independent with mobility using assistive device  Assistive Device required for Mobility: Front wheeled walker  General ADL/Self-Care Assistance Needs: None- Independent with ADLs and self care  Other Information: Pt. is confused, oriented to self only, unsure of accuracy of information as pt. contradicted her PLOF multiple times during session.     Inpatient Patient Safety Considerations  Patient call light within reach: Yes  Patient in bed at the end of treatment in: Supine position  Feet positioned in neutral and heels floating using appropriate device.: Yes, Nursing notified of proper position  Patient requires for safe ambulation: Front wheeled walker  The patient may be at risk for falls: Yes- Notified Nursing  Family/caregiver present for treatment: None Present     Subjective       Social History       Most Recent Value    Living Situation Lives alone  - 07/22/2016 1459    Home Environment Apartment  - 07/22/2016 1459    Home accessibility  Stairs present  - 07/22/2016 1459    Number of stairs to enter home 6-7  - 07/22/2016 1459    Bathroom Accessibility Tub/shower combo  - 07/22/2016 1459          Objective  Objective Findings: Mobility  Bed mobility- HOB elevated, supine to  sit ModA x2, sit to supine MaxA.  Pt was unable to follow 2 step commands to assist with bed mobility tasks, she was unable to follow command to  reach across bed for hand rail.  She became easily confused, and agitated.  Transfers sit to stand- MaxA x2. Pt fearful and expressed that her LLE was in a lot of pain 9/10, despite use of PCA pump right before activities.  She was able to do 2 sit to stands before she refused to do any more activities.   Chair Transfer: NA  Gait-  NA  Stairs-NA  Assistive Device-FWW    Balance  Sitting- Fair   Standing- Poor, Pt was unable to stand up straight and needed FWW and assist to stay in standing.    Strength unable to be tested. Pt was agitated and unable to follow two step commands.    Cognition: A&O x1, Pt knew her name but was confused as to the day, location, and why she was here.    Precautions: WBAT on LLE, update from MD no knee immobilizer necessary ROMAT    Gait Distance Ambulated Today: 0  Assistive Device Used: Front wheeled Banker Considerations While Mobilizing: Difficulty attending to directions  Physiologic Response to Mobilization: Asymptomatic with mobilization - vitals respond appropriately to activity, no subjective complaints         PT Functional       07/22/16 1400    Inpatient Precautions/Contraindications    Precautions Fall    Fall Bed/chair alarm;Socks/charm    Equipment/Lines Currently Present at Bedside Lines/drains/airways;Orthotics/splints    Lines/Drains/Airways Catheter;Telemetry;IV    Orthotics/Splints Orthotics - knee   Knee immobilizer      07/22/16 1400    [place] AM-PAC: Basic Mobility    Assistance Needed to Turn from Back to Side While in a Flat Bed Without Using Bedrails 2    Difficulty with Supine to Sit Transfer 2    How Much Help Needed to Move to/from Bed to Chair 2    Difficulty with Sit to Stand Transfer from Chair with Arms 2    How Much Help Needed to Walk in Room 1    How Much Help Needed to Climb 3-5 Steps with a Rail 1     AMPAC Total Score 10    Assessment: AM-PAC Basic Mobility - Current Functional Impairment Level Score 9-12      07/22/16 1400    Functional Mobility - General    Patient Participation in Mobilization Activities Patient agreeable to participate in mobility/or out of bed activities    Bed Mobility Moderate assistance (25-50% assistance)    Transfers to/from Stand Maximum assistance (50-75% assistance)    Safety Considerations While Mobilizing Difficulty attending to directions    Physiologic Response to Mobilization Asymptomatic with mobilization - vitals respond appropriately to activity, no subjective complaints      07/22/16 1400    Activity Tolerance    General Activity Tolerance Poor - tolerates 25-49% of treatment session                                    Treatment Today  Type of Eval  Low Complexity (97161): Completed  Therapeutic Procedures  Therapeutic Activities [phone]) : Functional activities;Patient education;Progressive mobilization to improve functional independence;Transfer training with weight shift and direction change     Total TIMED Treatment (min) : 30                Treatment Time   Treatment start time: 1330  Total TIMED Treatment  (min): 30  Total Treatment Time (min): 45

## 2016-07-22 NOTE — Interdisciplinary (Signed)
LLE knee immobilizer applied at bedside in room 9 MICU bed 1 per Dr. Dan HumphreysWalker.

## 2016-07-22 NOTE — Plan of Care (Signed)
Problem: Altered thought process as evidenced by psychosis/mood/cognitive dysfunction  Goal: Reduction and/or stabilization of target symptoms  Outcome: Progressing toward goal, anticipate improvement over: Next 24-48 hours  Pt emotional and agitated at times. Easily comforted. Will continue to assess.         Problem: Breathing Pattern - Ineffective  Goal: Respiratory rate, rhythm and depth return to baseline  Outcome: Goal Met  RR and O2 sat WNL on RA. Lungs clear to diminished. Will continue to assess.         Problem: Pain - Acute  Goal: Communication of presence of pain  Outcome: Goal Met  Pt reports pain in L leg. Pt needs to be continually reminded that she is connected to PCA Dilaudid. Pt able to press button on own. Pt resting in between doses of Dilaudid. Will continue to assess.         Problem: Mobility - Impaired  Goal: Able to perform range-of-motion exercises independently  Outcome: Progressing toward goal, anticipate improvement over: next 12-24 hours  Pt worked with PT and OT today. Dangle at edge of bed. Pt had difficulty and was anxious about it but tolerated it well. See therapy notes. Will continue to assess.         Problem: Altered safety related to suicide intent/plan and increased risk for suicide  Goal: Verbalizes/demonstrates no acute ideation/suicidal plan  Outcome: Goal Met  No SI. Will continue to assess.

## 2016-07-22 NOTE — Plan of Care (Addendum)
Problem: Altered safety related to suicide intent/plan and increased risk for suicide  Goal: Verbalizes/demonstrates no acute ideation/suicidal plan  Outcome: Progressing toward goal, anticipate improvement over: next 12-24 hours  Patient currently denies suicidal intent or plan, denies want to harm self or others. Psychiatry following. Medication available PRN for agitation (see MAR). Patient IMU status in [facility].   Goal: Patient is free from self harm, denies active suicidal intent or plan  Outcome: Progressing toward goal, anticipate improvement over: next 12-24 hours  Patient free from self-harm/injury. Denies active suicidal intent or plan.

## 2016-07-23 LAB — BASIC METABOLIC PANEL, BLOOD
Anion Gap: 11 mmol/L (ref 7–15)
BUN: 4 mg/dL — ABNORMAL LOW (ref 8–23)
Bicarbonate: 25 mmol/L (ref 22–29)
Calcium: 8 mg/dL — ABNORMAL LOW (ref 8.5–10.6)
Chloride: 104 mmol/L (ref 98–107)
Creatinine: 0.72 mg/dL (ref 0.51–0.95)
GFR: >60 mL/min
Glucose: 83 mg/dL (ref 70–99)
Potassium: 3.4 mmol/L — ABNORMAL LOW (ref 3.5–5.1)
Sodium: 140 mmol/L (ref 136–145)

## 2016-07-23 LAB — CONFIRM BENZODIAZEPINE, URINE
Conf. 7-amino-clonazepam: NEGATIVE ng/mL
Conf. Desalkylflurazepam: NEGATIVE ng/mL
Conf. Hydroxyethylflurazepam: NEGATIVE ng/mL
Conf. Lorazepam: NEGATIVE ng/mL
Conf. Nordiazepam: NEGATIVE ng/mL
Conf. Oxazepam: NEGATIVE ng/mL
Conf. Temazepam: NEGATIVE ng/mL
Conf. alpha-hydroxyalprazolam: NEGATIVE ng/mL
Conf. alpha-hydroxymidazolam: POSITIVE ng/mL — AB
Conf. alpha-hydroxytriazolam: NEGATIVE ng/mL

## 2016-07-23 LAB — HEMOGRAM, BLOOD
Hct: 23.2 % — ABNORMAL LOW (ref 34.0–45.0)
Hgb: 7.6 gm/dL — ABNORMAL LOW (ref 11.2–15.7)
MCH: 26.5 pg (ref 26.0–32.0)
MCHC: 32.8 g/dL (ref 32.0–36.0)
MCV: 80.8 um3 (ref 79.0–95.0)
MPV: 9.9 fL (ref 9.4–12.4)
Plt Count: 183 10*3/uL (ref 140–370)
RBC: 2.87 10*6/uL — ABNORMAL LOW (ref 3.90–5.20)
RDW: 17.6 % — ABNORMAL HIGH (ref 12.0–14.0)
WBC: 11.8 10*3/uL — ABNORMAL HIGH (ref 4.0–10.0)

## 2016-07-23 LAB — CONFIRM CANNABINOIDS-URINE BY LC-MSMS, QUALITATIVE: Conf. THC: POSITIVE ng/mL — AB

## 2016-07-23 LAB — PHOSPHORUS, BLOOD: Phosphorous: 2 mg/dL — ABNORMAL LOW (ref 2.7–4.5)

## 2016-07-23 LAB — MAGNESIUM, BLOOD: Magnesium: 1.8 mg/dL (ref 1.6–2.4)

## 2016-07-23 LAB — CPK-CREATINE PHOSPHOKINASE, BLOOD
CPK: 10569 U/L (ref 0–175)
CPK: 9757 U/L (ref 0–175)

## 2016-07-23 LAB — ANTI XA LMWH (LOW MOLECULAR WEIGHT HEPARIN): Anti Xa LMW (Low Molecular Weight): 0.23 [IU]/mL

## 2016-07-23 MED ORDER — HYDROCORTISONE 2.5 % EX OINT
TOPICAL_OINTMENT | Freq: Two times a day (BID) | CUTANEOUS | Status: DC
Start: 2016-07-23 — End: 2016-07-23
  Administered 2016-07-23: 15:00:00 via TOPICAL
  Filled 2016-07-23: qty 28.35

## 2016-07-23 MED ORDER — PREGABALIN 200 MG OR CAPS
200.0000 mg | ORAL_CAPSULE | Freq: Every day | ORAL | 0 refills | Status: DC
Start: 2016-07-23 — End: 2018-01-05

## 2016-07-23 MED ORDER — DOCUSATE SODIUM 250 MG OR CAPS
250.0000 mg | ORAL_CAPSULE | Freq: Every evening | ORAL | 0 refills | Status: DC
Start: 2016-07-23 — End: 2018-04-20

## 2016-07-23 MED ORDER — HYDROMORPHONE HCL 1 MG/ML IJ SOLN
1.0000 mg | INTRAMUSCULAR | Status: DC | PRN
Start: 2016-07-23 — End: 2016-07-23

## 2016-07-23 MED ORDER — OXYCODONE HCL 5 MG OR TABS
15.0000 mg | ORAL_TABLET | ORAL | Status: DC | PRN
Start: 2016-07-23 — End: 2016-07-23
  Administered 2016-07-23: 15 mg via ORAL
  Filled 2016-07-23: qty 1

## 2016-07-23 MED ORDER — AMLODIPINE 2.5 MG OR TABS
2.5000 mg | ORAL_TABLET | Freq: Every day | ORAL | 0 refills | Status: DC
Start: 2016-07-23 — End: 2018-04-20

## 2016-07-23 MED ORDER — DIPHENHYDRAMINE HCL 50 MG/ML IJ SOLN
25.00 mg | Freq: Once | INTRAMUSCULAR | Status: AC
Start: 2016-07-23 — End: 2016-07-23
  Administered 2016-07-23: 25 mg via INTRAVENOUS
  Filled 2016-07-23: qty 50

## 2016-07-23 MED ORDER — OXYCODONE HCL 10 MG OR TABS
10.0000 mg | ORAL_TABLET | ORAL | Status: DC | PRN
Start: 2016-07-23 — End: 2016-07-23

## 2016-07-23 MED ORDER — HYDROMORPHONE HCL 1 MG/ML IJ SOLN
1.0000 mg | INTRAMUSCULAR | 0 refills | Status: DC | PRN
Start: 2016-07-23 — End: 2018-01-05

## 2016-07-23 MED ORDER — OXYCODONE HCL 10 MG OR TABS
10.0000 mg | ORAL_TABLET | ORAL | 0 refills | Status: DC | PRN
Start: 2016-07-23 — End: 2018-01-05

## 2016-07-23 MED ORDER — HYDROCORTISONE 2.5 % EX OINT
1.0000 | TOPICAL_OINTMENT | Freq: Two times a day (BID) | CUTANEOUS | 0 refills | Status: DC
Start: 2016-07-23 — End: 2018-04-20

## 2016-07-23 MED ORDER — HYDROCORTISONE 2.5 % EX OINT
TOPICAL_OINTMENT | Freq: Two times a day (BID) | CUTANEOUS | Status: DC
Start: 2016-07-23 — End: 2016-07-23
  Filled 2016-07-23: qty 28.35

## 2016-07-23 MED ORDER — NALOXONE HCL 0.4 MG/ML IJ SOLN
0.1000 mg | INTRAMUSCULAR | 0 refills | Status: DC | PRN
Start: 2016-07-23 — End: 2018-04-20

## 2016-07-23 MED ORDER — ACETAMINOPHEN 325 MG PO TABS
650.0000 mg | ORAL_TABLET | ORAL | 0 refills | Status: DC | PRN
Start: 2016-07-23 — End: 2018-04-20

## 2016-07-23 MED ORDER — DIPHENHYDRAMINE HCL 50 MG/ML IJ SOLN
25.0000 mg | Freq: Four times a day (QID) | INTRAMUSCULAR | 0 refills | Status: DC | PRN
Start: 2016-07-23 — End: 2018-04-20

## 2016-07-23 MED ORDER — OXYCODONE HCL 15 MG OR TABS
15.0000 mg | ORAL_TABLET | ORAL | 0 refills | Status: DC | PRN
Start: 2016-07-23 — End: 2018-01-05

## 2016-07-23 MED ORDER — ENOXAPARIN SODIUM 40 MG/0.4ML SC SOLN
40.00 mg | Freq: Two times a day (BID) | SUBCUTANEOUS | 0 refills | Status: AC
Start: 2016-07-23 — End: 2016-08-20

## 2016-07-23 MED ORDER — HYDROXYZINE HCL 25 MG OR TABS
25.0000 mg | ORAL_TABLET | ORAL | Status: DC | PRN
Start: 2016-07-23 — End: 2016-07-23
  Administered 2016-07-23: 25 mg via ORAL
  Filled 2016-07-23: qty 1

## 2016-07-23 MED ORDER — SODIUM CHLORIDE 0.9 % IV SOLN
10.0000 mL/h | INTRAVENOUS | 0 refills | Status: DC
Start: 2016-07-23 — End: 2018-04-20

## 2016-07-23 MED ORDER — LANSOPRAZOLE 30 MG OR CPDR
30.0000 mg | DELAYED_RELEASE_CAPSULE | Freq: Every day | ORAL | 0 refills | Status: DC
Start: 2016-07-23 — End: 2018-04-20

## 2016-07-23 MED ORDER — HYDROXYZINE HCL 25 MG OR TABS
25.0000 mg | ORAL_TABLET | ORAL | 0 refills | Status: DC | PRN
Start: 2016-07-23 — End: 2018-01-05

## 2016-07-23 MED ORDER — SENNA 8.6 MG OR TABS
17.2000 mg | ORAL_TABLET | Freq: Every morning | ORAL | 0 refills | Status: DC
Start: 2016-07-23 — End: 2018-04-20

## 2016-07-23 NOTE — Plan of Care (Signed)
[facility] CVC left in place and saline locked. Dilaudid PCA d/c'd. Report given to FrederickXochella, Charity fundraiserN. Pt left via gurney and [facility] transport.

## 2016-07-23 NOTE — Interdisciplinary (Signed)
Physical Therapy Daily Follow up Note        Current Functional Level  [place] AM-PAC: Basic Mobility  Assistance Needed to Turn from Back to Side While in a Flat Bed Without Using Bedrails: A lot (mod/max assist)  Difficulty with Supine to Sit Transfer: A lot (mod/max assist)  How Much Help Needed to Move to/from Bed to Chair: A lot (mod/max assist)  Difficulty with Sit to Stand Transfer from Chair with Arms: A lot (mod/max assist)  How Much Help Needed to Walk in Room: Total (dependent)  How Much Help Needed to Climb 3-5 Steps with a Rail: Total (dependent)  AMPAC Total Score: 10  Assessment: AM-PAC Basic Mobility - Current Functional Impairment Level: CL: 60-79% impaired   Post Acute Discharge Considerations  Anticipated required level of cognitive assistance: Some of the time  Anticipated required level of safety assistance: Constant supervision  Anticipated required level of physical assistance: All of the time  Anticipated  level of therapy tolerance and ongoing therapy needs: Would benefit from intermittent Physical Therapy intervention post acute discharge to maximize functional independence  Equipment recommendations : To be determined as patient progresses with therapy    Admit Date: 07/19/2016         Preferred Language:English    Assessment  Pt is a 60 year old, domiciled female with history of unspecified mood d/o, opiate use d/o, BIB medics on 5150 DTS following a fall from a balcony with a L femur fracture, s/p L IMN. L LE WBAT and ROMAT, L LE in knee immobilizer brace. Today, pt required 2-3 person assist for all mobility. She was able to attempt multiple sit to stand transfers with FWW, but had difficulty achieving fully upright standing. Pt impulsive throughout tx and required additional time to complete all tasks due to anxiety. Pt also easily distractible and fixated on PCA pump. Recommend supervised living situation for further rehab and assistance with mobility. Pt would benefit  from continued IP PT to improve strength, activity tolerance, and safety awareness to increase functional independence.    Plan  Patient to continue therapy for     ICD-10-CM ICD-9-CM    1. Closed displaced intertrochanteric fracture of left femur, initial encounter (CMS-HCC) S72.142A 820.21 CONSULT/REFERRAL TO ORTHOPEDICS   2. Difficulty walking R26.2 719.7        Patient stated goal: To have less pain  Therapy Goals                                                                                        Current Level Goals for Episode of Care    Functional Deficit Functional Mobility   Long term Functional Goal  Functional deficit summary: Functional Mobility  Goal (Long Term) : Pt able to ascend and descent 6-7 stairs with CGA, in order to safely enter apartment.   No. visits: 10-14  Goal status: New   Impairment #1 Functional Mobility Short Term Goal #1 Impairment: Functional Mobility  Custom goal: Pt able to complete bed mobility skills, supine to sit and sit to supine with CGA, in order to get out of bed on her own.  No. visits: 1-3  Goal status: New   Impairment #2 Gait Short Term Goal #2 Impairment : Gait  Custom goal: Pt able to ambulate 47f CGA with FWW, in order to independently ambulate at home.  No. visits: 3-5  Goal status: New   Impairment #3 Functional Mobility Short Term Goal #3 Impairment : Functional Mobility  Functional mobility : Patient able to complete sit to stand transition from bed to chair with  Assistance : minimum assistance  No. visits: 3-5  Goal status: New   Impairment #4   Short Term Goal #4     Functional Limitation Reporting    Rationale: Acceptable functional assessment;Clinical judgement  Functional Assessment Tool: AM-PAC- 6 Clicks Basic Mobility Inpatient  Visit type: Initial ongoing  Impairment Category - One time only: Mobility  Impairment Category: Mobility  Mobility: Walking and Moving Around Current Status ([phone]: CL 60%-79% impaired, limited or restricted  Mobility:  Walking and Moving Around Goal Status ([phone]: CJ 20-39% impaired, limited or restricted  Functional Assessment Tool  Rationale: Acceptable functional assessment;Clinical judgement  Functional Assessment Tool: AM-PAC- 6 Clicks Basic Mobility Inpatient  Assessment: AM-PAC Basic Mobility - Current Functional Impairment Level: CL: 60-79% impaired     Objective Findings: Pt supine in bed upon PT arrival. MaxAx2 supine to sit, max verbal cues given, pt requiring cues for pacing and breathing. Pt maintained seated balance with varying levels of assistance (CGA-MaxA). ST[place], pt unable to achieve full hip extension. Verbal and tactile cues given for posture and transfer technique. Pt requiring additional time to complete all tasks 2/2 pain and anxiety. Pt required 3-person assist back to supine. Pt left supine in bed, all needs met, RN present.          PT Progress Report       07/23/16 1200    Rehab Potential Good      07/23/16 1200    Inpatient Treatment Plan    Frequency of treatment 5 times per week    Duration of treatment 10 visits    Status of treatment Patient evaluated and will benefit from ongoing skilled therapy      07/23/16 1200    Patient/Family Education    Learner(s) Patient    Patient/family training in appropriate therapeutic interventions Ongoing;Needs reinforcement    Education Topic(s) Mobility;Home/Community Safety;DME use and maintenance;Benefits of out of bed to chair;Activity restrictions and precautions      07/23/16 1200    Numeric Pain Rating Scale     Pain Intensity - rating at present 10    Pain Intensity - rating at worst  10    Pain Intensity- rating after treatment 10    Frequency  Constant    Description  Aching    Location  L hip    Pain Description RN aware, pt with PCA pump      07/23/16 1200    Inpatient Pain Assessment    Patient status Nursing in agreement for treatment;Patient agreeable with encouragement    Patient premedicated prior to treatment with  PCA/Pain pump    Other Pain Tools Numeric Pain Rating Scale      07/23/16 1200    Activity Restrictions    Activity Restrictions Weight bearing restrictions    Left Lower Extremity  Weight bearing as tolerated      07/23/16 1200    Inpatient Precautions/Contraindications    Precautions Fall    Fall Socks/charm;Bed/chair alarm    Equipment/Lines Currently Present at Bedside Lines/drains/airways;Orthotics/splints  Lines/Drains/Airways IV;Telemetry;Catheter    Orthotics/Splints Orthotics - knee      07/23/16 1200    Inpatient Patient Safety Considerations    Patient call light within reach Yes    Patient in bed at the end of treatment in Supine position    Feet positioned in neutral and heels floating using appropriate device. Yes, Nursing notified of proper position    Patient requires for safe ambulation Front wheeled walker    The patient may be at risk for falls Yes- Notified Nursing    Family/caregiver present for treatment None Present      07/23/16 1200    Therapy Plan Communication    Therapy Plan Communication Discussed therapy plan with Nursing      07/23/16 1200    Physical Therapy Patient Discharge Instructions    Your Physical Therapist suggests the following Continue to complete your home exercise program daily as instructed;Continue to follow your prescribed mobility precautions when moving in and out of bed and walking  as instructed      07/23/16 1200    Treatment provided today    Payor  Medicare/Private      07/23/16 1200    Therapeutic Procedures    Therapeutic Activities (74259)  Functional activities;Patient education;Progressive mobilization to improve functional independence;Transfer training with weight shift and direction change       Total TIMED Treatment (min)  45      07/23/16 1200    Focus for Next Treatment    Focus for Next Treatment Balance activities;Gait training;Safety instruction;Stair training;Strengthening;Therapeutic exercise;Therapeutic activities      07/23/16 1200     Treatment Time     Treatment start time 0940    Total TIMED Treatment  (min) 45    Total Treatment Time (min) 45           07/23/16 1200    Gait    Assistive Device Used Front wheeled walker    Gait Distance Ambulated Today 0                    PT Discharge with Treatment Report       07/23/16 1200    Inpatient Therapy Overview    Patient participation Good, patient participated in at least 75% of all treatment sessions      07/23/16 1200    Physical Therapy Patient Discharge Instructions    Your Physical Therapist suggests the following Continue to complete your home exercise program daily as instructed;Continue to follow your prescribed mobility precautions when moving in and out of bed and walking  as instructed      07/23/16 1200    Inpatient Progress report    Patient compliance with therapy program Fair    Response to Previous Treatment Patient making fair progress towards set functional goals    Response to therapy Fair

## 2016-07-23 NOTE — Interdisciplinary (Addendum)
Occupational Therapy Daily Follow up Note    Current Functional Level  [place] AM-PAC: Daily Activity  Assistance Needed to Put on and Take off Regular Lower Body Clothing: Total (Dependent)  Assistance Needed to Bathe, Including Washing, Rinsing, and Drying: A lot (Mod/Max Assist)  Assistance Needed to Toilet [employer](Toilet, Bedpan, or Urinal): Total (Dependent)  Assistance Needed to Put on and Take off Regular Upper Body Clothing: A lot (Mod/Max Assist)  Assistance Needed to Take Care of Personal Grooming Such as Brushing Teeth: A lot (Mod/Max Assist)  Assistance Needed to Eat Meals: A lot (Mod/Max Assist)  AM-PAC Daily Activity Total Score: 10  Assessment: AM-PAC Daily Activity - Current Functional Impairment Level:  CL: 60-79% impaired  Post Acute Discharge Considerations  Anticipated Required Level of Cognitive Assistance: Most of the time  Anticipated Required Level of Safety Assistance: Constant supervision  Anticipated Required Level of Self Care Assistance: All of the time  Anticipated Physical Barriers Impacting Discharge: Multi-level home  Anticipated Level of Therapy Tolerance and Ongoing Therapy Needs: Would benefit from intermittent Occupational Therapy intervention post acute discharge to maximize functional independence  Equipment Recommendations: To be determined as patient progresses with therapy;3-in-1 commode    Admit Date: 07/19/2016              Preferred Language:English    Plan  Patient to continue therapy for     ICD-10-CM ICD-9-CM    1. Closed displaced intertrochanteric fracture of left femur, initial encounter (CMS-HCC) S72.142A 820.21 CONSULT/REFERRAL TO ORTHOPEDICS   2. Difficulty walking R26.2 719.7        Patient Stated Goal: "I wanna take the pain away"  Therapy Goals                                                                                        Current Level Goals for Episode of Care    Functional Deficit     Long term Functional Goal      Impairment #1 ADL function  Short Term Goal #1 Impairment: ADL function  Custom Goal 1: Pt. will complete LBD with MinA using adaptive techniques/equipment as needed.   Number of Visits: 5-7  Goal Status: New   Impairment #2 ADL function Short Term Goal #2 Impairment: ADL function  Custom Goal 2: Pt. will complete BSC transfer with MinA using FWW.   Number of Visits: 5-7  Goal Status: New   Impairment #3 ADL function Short Term Goal #3 Impairment: ADL function  Custom Goal 3: Pt. will complete toileting tasks with MinA  Number of Visits: 5-7   Impairment #4   Short Term Goal #4     Functional Limitation Reporting    Rationale: Acceptable functional assessment;Clinical judgement;Patient history and medical status  Functional Assessment Tool: AM-PAC- 6 Clicks Daily Activity Inpatient  Visit Type: Initial ongoing  Impairment Category - Eval/Progress: Self Care  Self Care Current Status ([id number]([id number]): CL 60%-79% impaired, limited or restricted  Self Care Goal Status ([id number](G8988): CJ 20-39% impaired, limited or restricted  Functional Assessment Tool  Rationale: Acceptable functional assessment;Clinical judgement;Patient history and medical status  Functional Assessment Tool: AM-PAC- 6 Clicks Daily Activity Inpatient  Assessment:  AM-PAC Daily Activity - Current Functional Impairment Level:  CL: 60-79% impaired     Objective Findings: Pt seen for OT tx, received supine, agreeable to OT however with significant anxiety d/t reported 10/10 pain. Pt on PCA, pressed 3x throughout session. Max A x2 supine to EOB w/ Max cues for hand placement to handrail and orienting upright. Pt confused, slightly agitated requiring Max cues for breathing and activity pacing. Max A for B sock adjustment. Pt reported dizziness in sit, BP stable. Pt impulsive, giving no cues for attempt to stand after requesting to go back to bed, Mod A x2 to stand, Max cues for hand placement on FWW. Pt w/ limited standing tolerance approx 5-10 sec. 3-Person A back to supine. Pt left in supine, needs  in reach, RN at bedside.          Occupational Therapy Plan and Progress Report       07/23/16 1100    Inpatient Treatment Plan    Frequency of treatment  4 times per week    Duration of Treatment 14 visits    Status of Treatment Patient evaluated and will benefit from ongoing skilled therapy      07/23/16 1100    Treatment Plan Discussion    Include in My Healthcare Myself;My healthcare team    Treatment Plan Discussion & Agreement Patient    Patient/Family Questions No questions      07/23/16 1100    Inpatient Pain Assessment     Patient status Patient agreeable to treatment;Nursing in agreement for treatment;Behavioral signs/symptoms of pain present      07/23/16 1100    Assessment    Progress Towards Goals Patient has made minimal progress towards achievement of goals.    Pt safety and I w/ functional mobility and ADLs limited by LLE/hip pain, anxiety. Requires 2-person A and extra time for all transfers. Limit positional cues to simple commands as pt w/ limited attention. Would benefit from continued IP OT to max functional safety and I w/ ADLs and mobility.    Potential to improve with therapy Good      07/23/16 1100    Treatment Provided Today    Payor Medicare/Private      07/23/16 1100    Therapeutic Procedures    Therapeutic Activities (97530) Assistance/facilitation of bed mobility;Dynamic activities to improve performance of functional tasks/activities;Facilitation of safety awareness/responses during functional tasks;Progressive mobilization to improve functional independence;Weight shift activities to improve safety in unsupported sitting or standing        Total TIMED Treatment (min) 40      07/23/16 1100    Treatment Time    Treatment Start Time 0935    Total TIMED Treatment (min) 45    Total Treatment Time (min) 45      07/23/16 1100    Focus for Next Treatment    Focus for Next Treatment ADL retraining;Functional mobility training for ADLs;Safety instruction      07/23/16 1100    Inpatient Patient  Safety Considerations    Patient Call Light Within Reach Yes    Patient in Bed at the End of Treatment In Supine position    Feet Positioned in Neutral and Heels Floating Using Appropriate Device Yes, nursing notified of proper position    Patient Requires for Safe Ambulation Front wheeled walker    The Patient May Be at Risk for Falls Yes, notified nursing    Family/Caregiver Present for Treatment None present      07/23/16 1100  Therapy Plan Communication    Therapy Plan Communication Discussed therapy plan with Nursing      07/23/16 1100    Occupational Therapy Patient Discharge Instructions    Your Occupational Therapist Suggests the Following Continue to complete your self care Activities of Daily Living as frequently as possible;Continue to follow your prescribed mobility precautions when transferring to the chair and toilet as instructed    Supervision is suggested for  Bathe;Dress;Toilet                                      Occupational Therapy Discharge Summary with Treatment       07/23/16 1100    Occupational Therapy Patient Discharge Instructions    Your Occupational Therapist Suggests the Following Continue to complete your self care Activities of Daily Living as frequently as possible;Continue to follow your prescribed mobility precautions when transferring to the chair and toilet as instructed    Supervision is suggested for  Bathe;Dress;Toilet

## 2016-07-23 NOTE — Discharge Summary (Signed)
Patient Name:  Alexandria Proctor    Principal Diagnosis (required):  Closed displaced fracture of left femoral neck (CMS-HCC)      Hospital Problem List (required):  Active Hospital Problems    Diagnosis   . Bipolar affective (CMS-HCC) [F31.9]   . Amphetamine abuse [F15.10]   . Opioid abuse [F11.10]   . Fracture, intertrochanteric, left femur (CMS-HCC) [S72.142A]   . HTN (hypertension) [I10]   . HLD (hyperlipidemia) [E78.5]   . Obesity [E66.9]   . Closed displaced fracture of left femoral neck (CMS-HCC) [S72.002A]      Resolved Hospital Problems    Diagnosis   No resolved problems to display.       Additional Hospital Diagnoses ("rule out" or "suspected" diagnoses, etc.):      Rule out intrathoracic injury, rule out pelvic injury, rule out spinal fracture, rule out abdominal injury, rule out vascular injury to L leg, rule out other fractures to L leg    Principal Procedure During This Hospitalization (required):  CT imaging of Head, C-spine, L leg  Ultrasound of Abdomen, lower extremities  CXR, PXR, XR T- spine, XR L-spine, XR L knee, XR L hip, XR L femur, XR L hand, XR L shoulder  ATLS  ORIF L femur with Ortho    Other Procedures Performed During This Hospitalization (required):  None    Inferior Vena Cava Filter Placement:  An IVC filter was not placed during this hospitalization.    Consultations Obtained During This Hospitalization:  Case Management  Occupational Therapy  Orthopedic Surgery  Physical Therapy  Psychiatry  Social Work    Reason for Admission to the Hospital / History of Present Illness:  79F who per report was seen swinging from her balcony into the balcony below by a bed sheet.  +LOC. Also report to have been screaming from her balcony all day long about her boyfriend. Arrived in the trauma bay still yelling and stating she is not crazy, please help me. Could not provide any other info regarding what happened, incomplete recall of events.     Hospital Course (required):  #L subtrochanteric femur  fracture: Ortho evaluated the patient and took her to the OR on 10/29 for intramedullary rodding of the femur. An additional CT of the L knee was ordered to confirm that there was no tibial plateau fracture and she was allowed to be WBAT with knee immobilizer after this returned negative. She will need 4 weeks of lovenox and physical therapy. Her pain control was initially poor but improved with a dilaudid PCA.    #Rhabdomyolysis: CPKs downtrended with appropriate fluid resuscitation until checks were stopped    #Psychosis: psychiatry evaluated the patient and suspected she may be opioid-tolerant, as pain was poorly controlled and a source of agitation. She had no further psychosis after 10/30. She no longer required a sitter and psychiatry signed off.    Discharge Condition (required):  Temporary Disability, Expected to Return to Previous Level of Function.    Key Findings at Discharge:    Glasgow Coma Scale:    Eyes: open spontaneously - 4    Verbal: Oriented Conversation - 5    Motor: Obeys Command - 6    Discharge Medications:     What To Do With Your Medications      START taking these medications       Add'l Info    acetaminophen 325 MG tablet   Commonly known as:  TYLENOL   Take 2 tablets (650 mg) by  mouth every 4 hours as needed for Mild Pain (Pain Score 1-3).    Quantity:  30 tablet   Refills:  0       amLODIPINE 2.5 MG tablet   Commonly known as:  NORVASC   Take 1 tablet (2.5 mg) by mouth every morning (with breakfast).    Quantity:  30 tablet   Refills:  0       diphenhydrAMINE 50 MG/ML injection   Commonly known as:  BENADRYL   Inject 0.5 mLs (25 mg) into vein every 6 hours as needed (itching).    Quantity:  1 vial   Refills:  0       docusate sodium 250 MG capsule   Commonly known as:  COLACE   Take 1 capsule (250 mg) by mouth nightly.    Quantity:  60 capsule   Refills:  0       enoxaparin 40 MG/0.4ML injection   Commonly known as:  LOVENOX   Inject 0.4 mLs (40 mg) under the skin every 12 hours.     Quantity:  56 Syringe   Refills:  0       hydrocortisone 2.5 % ointment   Apply 1 Application topically 2 times daily. Use a small amount as directed    Quantity:  1 Tube   Refills:  0       HYDROmorphone 1 MG/ML injection   Commonly known as:  DILAUDID   Inject 1 mL (1 mg) into vein every 2 hours as needed (RESCUE dose: may be administered for severe pain not relieved by oral medications after administration; not to exceed 3 doses in any 24 hour period).    Quantity:  30 mL   Refills:  0       hydrOXYzine HCL 25 MG tablet   Commonly known as:  ATARAX   Take 1 tablet (25 mg) by mouth every 2 hours as needed for Itching.    Quantity:  30 tablet   Refills:  0       lansoprazole 30 MG capsule   Commonly known as:  PREVACID   Take 1 capsule (30 mg) by mouth every morning (before breakfast).    Quantity:  30 capsule   Refills:  0       nalOXone 0.4 MG/ML injection   Commonly known as:  NARCAN   Inject 0.25 mLs (0.1 mg) into vein every 2 minutes as needed (somnolence).    Quantity:  2 vial   Refills:  0       * oxyCODONE 10 MG tablet   Commonly known as:  ROXICODONE   Take 1 tablet (10 mg) by mouth every 4 hours as needed for Moderate Pain (Pain Score 4-6).    Quantity:  30 tablet   Refills:  0       * oxyCODONE 15 MG immediate release tablet   Commonly known as:  ROXICODONE   Take 1 tablet (15 mg) by mouth every 4 hours as needed for Severe Pain (Pain Score 7-10).    Quantity:  30 tablet   Refills:  0       pregabalin 200 MG capsule   Commonly known as:  LYRICA   Take 1 capsule (200 mg) by mouth daily.    Quantity:  30 capsule   Refills:  0       senna 8.6 MG tablet   Commonly known as:  SENOKOT   Take 2 tablets (17.2 mg) by mouth every morning.  Quantity:  30 tablet   Refills:  0       sodium chloride 0.9 % solution   Inject 10 mL/hr into vein continuous.    Quantity:  100 mL   Refills:  0       * Notice:  This list has 2 medication(s) that are the same as other medications prescribed for you. Read the  directions carefully, and ask your doctor or other care provider to review them with you.         Where to Get Your Medications      Please check with staff for printed prescription or if prescription was faxed to your pharmacy.     Bring a paper prescription for each of these medications    . acetaminophen 325 MG tablet   . amLODIPINE 2.5 MG tablet   . diphenhydrAMINE 50 MG/ML injection   . docusate sodium 250 MG capsule   . enoxaparin 40 MG/0.4ML injection   . hydrocortisone 2.5 % ointment   . HYDROmorphone 1 MG/ML injection   . hydrOXYzine HCL 25 MG tablet   . lansoprazole 30 MG capsule   . nalOXone 0.4 MG/ML injection   . oxyCODONE 10 MG tablet   . oxyCODONE 15 MG immediate release tablet   . pregabalin 200 MG capsule   . senna 8.6 MG tablet   . sodium chloride 0.9 % solution             Allergies:  Allergies   Allergen Reactions   . Haldol [Haloperidol] Swelling   . Ondansetron Swelling   . Toradol [Ketorolac Tromethamine] Swelling   . Reglan [Metoclopramide] Unspecified   . Tramadol Unspecified       Discharge Disposition:  Acute care hospital.    Follow Up Appointments:    Scheduled appointments:  Future Appointments  Date Time Provider Department Center   08/05/2016 8:00 AM Elaina Pattee, MD [facility]       For appointments requested for after discharge that have not yet been scheduled, refer to the Post Discharge Referrals section of the After Visit Summary.    Discharging Physician's Contact Information:  [facility] operator at [phone].

## 2016-07-23 NOTE — Plan of Care (Signed)
Problem: Altered thought process as evidenced by psychosis/mood/cognitive dysfunction  Goal: Reduction and/or stabilization of target symptoms  Patient needs frequent reorienting to situation. Easily becomes anxious and unable to follow commands. Redirection and comfort provided by staff.    Problem: Pain - Acute  Goal: Communication of presence of pain  Patient has constant pain to L Leg s/p IMN on 10/29.  Dilaudid PCA in place for pain control. Education provided with frequent reinforcement needed due to patient being forgetful. Con't plan of care.    Problem: Mobility - Impaired  Goal: Able to perform range-of-motion exercises independently  Patient kept on bed rest due to LLE femure fracture. Knee immobilizer in place. PT/OT scheduled.

## 2016-07-23 NOTE — Interdisciplinary (Addendum)
07/23/16 1400   Discharge Planning Needs   Actions Needed for Discharge Final DC Order - Post-acute care arranged;Other (Comment):;Family notification/acceptance   Does this patient have CM discharge planning needs? Yes   CM Needs Met? Yes   Transfer Contact Information   Name of Facility Transferring To: [facility] Type: Acute hospital   Care Coordination Documentation   Transportation arrangements: Deborah Chalk [phone] will set up the ambulance transportation and will call directly [facility] at [phone]  to provide the pick up time.    Additional Discharge Instructions   Information from Case Management: [facility] [phone] will call the unit directly to provide the transfer details - tel# to call report, MD to follow and time of pick up.    CM met with patient at bedside, alert, ox4, informed her that [facility] , patient initially declined to transfer to [facility] and this CM explained to her that insurance dictates because they are the one paying the bill. Patient finally agreed to transfer to [facility].  Called and left voicemail to daughter [phone] ( [phone]) , and also at [phone] , awaiting call back.

## 2016-07-23 NOTE — Progress Notes (Signed)
Progress Note  White/Trauma    Patient Name:  Alexandria Proctor, Alexandria Proctor)  MRN: [id number]  Room#: MICU01/MICU01  Mechanism of Injury: fall from balcony.    Proctor Day:   4 days - Admitted on: 07/19/2016 Post-Injury Day: 4 Service: Trauma Surgery    #  Injury  Management  Date   1 left subtroch femurfracture Ortho: s/p repair 07/20/16   2 Rhabdo Q6h CPK and fluid rescus  10/28   3 Psychosis  Psych consult       50Fwho fell from a balcony while trying to cross to a lower balcony. Repeats "I'm not crazy"    SUBJECTIVE: Leg hurts, using PCA.    Events/Complaints: Urinary retention overnight. Foley placed.    OBJECTIVE:    Pain score: Patient Sleeping, Respiratory Assessment Done    Vital Signs:     Latest Entry  Range (last 24 hours)    Temperature: 99 F (37.2 C)  Temp  Avg: 98.8 F (37.1 C)  Min: 97.8 F (36.6 C)  Max: 99.6 F (37.6 C)    Blood pressure (BP): 125/67  BP  Min: 99/47  Max: 171/84    Heart Rate: 85  Pulse  Avg: 92.4  Min: 85  Max: 108    Respirations: 16  Resp  Avg: 18.6  Min: 15  Max: 23    SpO2: 100 %  SpO2  Avg: 97.3 %  Min: 94 %  Max: 100 %     Weight: 104 kg (229 lb 4.5 oz)  Percentage Weight Change (%): 0 %    10/31 0600 - 11/01 0559  In: 2922.9 [P.O.:300; I.V.:2622.9]  Out: 2050 [Urine:2050]  Bowel movement: PTA    Physical:  General Appearance: healthy, alert, no distress, pleasant affect, cooperative, skin warm, dry, and pink.  Heart:  normal rate and regular rhythm, no murmurs.  Lungs: clear to auscultation, no chest deformities noted.  Abdomen: Abdomen soft, non-tender. No masses or organomegaly. Bowel sounds normal.  Extremities:  no cyanosis, clubbing, or edema and distal pulses normal.  LLE in knee immobilizer.  Wiggles toes, CR < 2 sec.  OR dressings C/D/I.  Left chin ecchymosis.    Glasgow Coma Scale Score: 14    Labs:     CBC  Recent Labs      07/21/16   0527  07/22/16   0323  07/23/16   0425   WBC  11.5*  11.9*  11.8*   HGB  8.1  8.1  7.6*   HCT  25.0  24.4  23.2*      PLT  154  169  183   SEG  85  82   --    LYMPHS  7  8   --    MONOS  7  8   --         Chemistry  Recent Labs      07/22/16   0323  07/23/16   0425   NA  140  140   K  3.8  3.4*   CL  106  104   BICARB  22  25   BUN  6*  4*   CREAT  0.77  0.72   GLU  80  83   [place]  7.9*  8.0*   MG  1.9  1.8   PHOS  1.8*  2.0*     No results for input(s): ALK, AST, ALT, TBILI, DBILI, ALB in the last 72 hours.  Coags  Recent Labs      07/21/16   0527  07/22/16   0323   PT  13.5*  12.5   INR  1.2  1.2       Toxicology:  Lab Results   Component Value Date    ETOH <11 07/19/2016    BARBCLASS Negative 07/20/2016    BENZDIAZCL Pending confirm 07/20/2016    BENZYLCGNN Negative 07/20/2016    METHADONE Negative 07/20/2016    OPIATESCL Negative 07/20/2016    OXY Negative 07/20/2016    PHENCYCLDN Negative 07/20/2016    TETCANNABIN Pending confirm 07/20/2016       Radiology:   07/21/16 BLE duplex: Neg for DVT    Medications:  Scheduled Meds  . amLODIPINE  2.5 mg QAM WC   . docusate sodium  250 mg Nightly   . enoxaparin  40 mg Q12H   . lansoprazole  30 mg QAM AC   . pregabalin  200 mg Daily   . senna  2 tablet QAM     PRN Meds  . acetaminophen  650 mg Q4H PRN   . diphenhydrAMINE  25 mg Q6H PRN   . nalOXone  0.1 mg Q2 Min PRN   . OLANZapine  2.5 mg Q6H PRN       Smoking History:    reports that she has been smoking.  She does not have any smokeless tobacco history on file.    ASSESSMENT / PLAN:    60 year old female admitted s/p fall from balcony.    Ortho (07/22/16):  80F w low left IT femur fx s/p IMN on 10/29 doing well post-operatively.   Plan:    NWB LLE for now - need CT of L knee to assess for tibial plateau fracture, if negative will advance to wbat, knee immobilizer   No new films needed for left shoulder, prior shoulder surgery likely distal clavicle excision   2 doses of ancef 2 gmq8 hrs  4 weeks of lovenox  PT consult  Addendum:  Reviewed CT scan - effusion, endstage osteoarthritis, no fracture.   Addended plan - WBAT LLE, no  knee immobilizer needed, ROMAT left knee. Compressive wrap, ice to knee. (progressive mobility protocol ordered was changed to reflect change in activity recommendations).     Psychiatry (07/22/16):  Amphetamine intoxication, resolved  R/o amphetamine use disorder  Unspecified mood disorder, by history  Opioid use disorder, by history  Recommendations:  - This patient has a chart history of opioid use disorder and may therefore have tolerance to opioid medications. Uncontrolled pain appears to have been the primary source of patient's agitation during this stay.   - There is no evidence of psychotic symptoms yesterday or today. No further indication for antipsychotics. Discontinue olanzapine.  - Legal status: 5150 expires today. Will discontinue. Change legal status to voluntary, effective now.  - Level of observation: no sitter required at this time  - Psychiatry service will sign off at this time.    Trauma:  -cont po diet  -PCA  -PT/OT, OOB  -dispo planning    Viann Fish, NP

## 2016-07-23 NOTE — Interdisciplinary (Addendum)
Received a call from [facility] CM Arby BarretteGlacy (tel# [phone]  & fax# [phone]), requesting clinicals faxed to her and plan of care for today.  [facility]  Paged Primary TEAM if patient stable [facility].  Awaiting call back.       Addendum: (1st to call paged)  re: Parks RangerRousseau, Artasia, MICU01, MRN [id number]. [facility] Glacy requesting update -plan of care today, if stable to transfer  need order level of care, MD to contact for MD to MD. Thank you.       Paged [phone] and returned call, stated that NP Earlean ShawlGabriella Riviello has this patient.    1253H:  CM paged NP Andre LefortIVIELLO, GABRIELA [phone] re: Stephan MinisterMICU1, Mukherjee, Latriece, MRN [id number], [facility] is calling re: update. Tried to page [phone] earlier re: this patient and also [phone], they said you have this pt. Pls call me back . thank you.       1420H:  Paged TRAUMA 1st to call [phone] and [phone].  re: Parks RangerMICU01, Such, Modine, MRN [id number], [facility] CM called, [facility] - Dr. Kemper DurieGreenway, they will set up the transfer today, [facility] CM will call unit to provide the transfer details directly. Pls call back to confirm. Thanks.     1430H :  Paged also [phone] , as per NP Jan, 1st to call is [phone].

## 2016-07-23 NOTE — Progress Notes (Signed)
Pharmacokinetics Note - Enoxaparin    Indication: DVT/PE Prophylaxis  Current Regimen: Enoxaparin 40 mg SQ Q 12 hr.  Treatment day #3.  Target Anti-Xa Level: 0.2-0.4 IU/mL (Q12H dosing)    Current Levels & Pertinent Labs  IP Enoxaparin Latest Ref Rng & Units 07/23/2016 07/22/2016 07/21/2016 07/21/2016 07/21/2016   Anti-Xa - LMWH [IU]/mL 0.23 - - - -   SCr 0.51 - 0.95 mg/dL 4.780.72 2.950.77 6.210.79 3.080.77 6.570.81   GFR Non-AA mL/min >60 >60 >60 >60 >60   Plt Count 140 - 370 1000/mm3 183 169 - - -   Hgb 11.2 - 15.7 gm/dL 7.6(L) 8.1 - - -   Hct 34.0 - 45.0 % 23.2(L) 24.4 - - -   Some recent data might be hidden       Assessment/Plan:  -  Anti-Xa today 0.23 (4.25hr post-dose, after 3rd dose) within target range for DVT ppx. Recommend continue enoxaparin 40mg  SQ 12h.   -  Continue to monitor sCr daily.     Pharmacy will continue to follow and make adjustments/recommendations to therapy as needed.    Neill LoftAndrea Dan-Anh Nguyen, PHARMD

## 2016-07-23 NOTE — Utilization Review (Signed)
==07/19/2016 XRAY KNEE     EXAM DESCRIPTION:  KNEE ZOXW-RU([id number]-[id number]) Qty:1    CLINICAL HISTORY:  Status post jump off balcony.    TECHNIQUE:  Single AP and single lateral views of the left knee.    COMPARISON:  None available.    FINDINGS:  No acute fracture or dislocation.    Mild to moderate tricompartmental osteoarthrosis, characterized predominantly   by osteophytosis, that is most notable at the femorotibial compartment.    Enthesopathy at the patellar base.    Trauma board artifact.    Few scattered vascular calcifications. Few scattered subcentimeter   ossific/calcific densities, for instance in the anterior aspect of the distal   thigh, nonspecific but may reflect overlying debris/artifact.    IMPRESSION:  No acute osseous abnormality of the left knee.    CONCURRENT SUPERVISION:  I have reviewed the images and agree with the fellow's report.       ==XRAY ELBOW  EXAM DESCRIPTION:  ELBOW [id number]-BJ([id number]-RT([id number]) Qty:1    CLINICAL HISTORY:  Jumped off balcony.    TECHNIQUE:  2 Images of the right elbow.    COMPARISON:  None available.    FINDINGS:  Lateral view suboptimal owing to patient positioning/technique. No acute   fracture or dislocation. Bones are demineralized. Minimal enthesopathy at the   olecranon and lateral epicondyle. No large joint effusion.    Minimal amount of soft tissue swelling posteriorly, nonspecific. Skin fold   versus small amount of subcutaneous emphysema at the antecubital fossa and   please correlate with physical exam findings.    IMPRESSION:  Question small amount of subcutaneous emphysema at the antecubital fossa and   please correlate physical exam findings and for possible soft tissue injury in  this region.    No acute fracture or dislocation.    ==XRAY TIBIA & FIBULA  EXAM DESCRIPTION:  TIBIA AND FIBULA-LT(73590) Qty:1    CLINICAL HISTORY:  Trauma, fall    TECHNIQUE:  2 Images of the left tibia and fibula.    COMPARISON:  None available.    FINDINGS:  There is an overlying trauma board  which somewhat limits evaluation. No acute   fracture or dislocation. The proximal-most aspect of the tibia is excluded on   this examination. Bones are demineralized. Minimal plantar calcaneal   enthesopathy. Sub 5 mm sclerotic density projecting over the region of the   anterior calcaneal process, nonspecific but may reflect overlap of shadows or   small os naviculare.    IMPRESSION:  No acute osseous abnormality of the left tibia and fibula.    ==XRAY CHEST  EXAM DESCRIPTION:  CHEST X-RAY 1 VW(71010) Qty:1    CLINICAL HISTORY:  History of jumping off a balcony.    COMPARISON:  None available.    FINDINGS:  Expanded lungs with no consolidation.    No pleural effusion or pneumothorax demonstrated. Probable small amount of   atherosclerotic calcifications.    No definitive acute osseous abnormality identified. Multilevel degenerative   changes of the visualized spine, characterized by degenerative discogenic   disease/osteophytosis, suboptimally evaluated. Degenerative changes of the   glenohumeral joints.    Suture material projecting over the right upper abdominal quadrant, likely   representing prior surgery.    Background trauma board artifact.    IMPRESSION:  No radiographic evidence of acute pulmonary abnormality.    ==XRAY LUMBOSACRAL  EXAM DESCRIPTION:  X-RAY DORSAL SPINE 2 [id number]) Qty:1    CLINICAL HISTORY:  Status post jump off balcony.  TECHNIQUE:  Single AP and single lateral views of the thoracic spine. Single AP and 2   lateral views of the thoracolumbar spine.    COMPARISON:  None.    FINDINGS:  THORACIC SPINE:  Limited visualization of the superior thoracic spine due to poor penetration   on the lateral radiograph. There are 12 rib-bearing vertebral bodies.    No definitive evidence of an acute fracture. Bones are demineralized.   DISH-like changes of the thoracic spine.    Subcentimeter rounded density projecting over the left transverse process C7,   nonspecific but probably overlying  patient.    LUMBAR SPINE:  There are 5 lumbar type vertebral bodies.    No evidence of an acute fracture. Vertebral body heights are maintained.    There is 6 mm anterolisthesis of L4 on L5, likely on a degenerative basis.    Bones are demineralized.    Multilevel degenerative changes of the visualized lumbar spine, characterized   by facet arthropathy that is most notable at the L3-S1 levels where there are   severe changes.    Atherosclerotic calcifications of the abdominal aorta. Right femoral vascular   catheter projecting over the right pelvic inlet and terminating over the right  sacrum. Surgical suture chains and clips project over the left abdomen   debris. Ill-defined curvilinear density projecting over the right abdomen,   nonspecific but probably reflecting overlying drape/clothing material.    IMPRESSION:  No radiographic evidence of an acute osseous abnormality the thoracic and   lumbar spine.    CONCURRENT SUPERVISION:  I have reviewed the images and agree with the resident interpretation.    ==XRAY PELVIS   EXAM DESCRIPTION:  X-RAY PELVIS 1 JW(11914) Qty:1    CLINICAL HISTORY:  Jumped off balcony. Landed on another balcony.    TECHNIQUE:  Single AP view of the pelvis.    COMPARISON:  None available.    FINDINGS:  Background trauma board artifact somewhat limits evaluation.    Markedly displaced left proximal femoral acute comminuted fracture, centered   at the subtrochanteric region, with greater than a full shaft width   displacement of the distal fracture fragment.    Mild degenerative changes of the sacroiliac joints. Mild degenerative changes   of the right femoroacetabular joint, characterized by osteophytosis.    Pubic symphysis is congruent.    At least mild degenerative changes of the lower lumbar spine, suboptimally   evaluated.    Well-circumscribed ovoid density projecting over right sacrum, nonspecific but  may reflect some dense material within the fecal stream. Additionally noted    small pelvic phleboliths noted.    IMPRESSION:  1. Markedly displaced left proximal femoral comminuted fracture, centered at   the subtrochanteric region, with greater than a full shaft width displacement   of the distal fracture fragment.    Critical Results: Significant findings delineated above were seen at 18:40   on 07/19/2016 and verbally communicated by Gillie Manners to the care provider,   K. Dilorenzo, at 18:40 on 07/19/2016.  CTRM:2001:verbal.    CONCURRENT SUPERVISION:  I have reviewed the images and agree with the fellow's report.      ==CT SPINE  EXAM DESCRIPTION:  CT CERVICAL SPINE W/O CONTRAST(72125) Qty:1    CLINICAL HISTORY:  Status post fall off balcony.    TECHNIQUE:  Helical CT was obtained from the skull base through the upper chest without   contrast and reconstructed in axial, sagittal and coronal planes using soft   tissue  and bone window algorithms.    CTDI volume: 57.3, 23.4 mGy  DLP: 1550.2 mGy-cm    COMPARISON:  None.    FINDINGS:  There is no evidence for fracture or subluxation. The vertebral body heights,   spinal alignment, spinal canal and neuroforamina are well maintained.    Mild multilevel degenerative changes of the visualized cervicothoracic spine,   characterized predominantly by degenerative discogenic disease and endplate   osteophytosis. Additionally mild multilevel facet arthropathy and   uncovertebral hypertrophy involving the cervical spine. Mild degenerative   changes noted at the atlanto-dens joint. Bifid appearance of the C2 and C4   through C6 spinous processes.    There is normal bone mineralization. Paravertebral soft tissues are normal.    Minimal posterior biapical ground-glass opacities, compatible with dependent   subsegmental atelectasis. No pulmonary nodules or consolidations noted in the   visualized lung apices. Mild atherosclerotic calcifications of the aortic arch  and elsewhere.    IMPRESSION:  1. No acute osseous findings of the cervical  spine.    2. Mild degenerative changes of the cervical spine, as described above.    CONCURRENT SUPERVISION:  I have reviewed the images and agree with the resident's interpretation.    ==CT HEAD  EXAM DESCRIPTION:  CT HEAD/BRAIN(70450) Qty:1: 07/19/2016 7:54 pm    CLINICAL HISTORY:  Patient jumped off a balcony.    TECHNIQUE:  A CT scan of the head was performed with 2 mm contiguous axial slices from the  foramen magnum to the skull vertex without the use of intravenous contrast.    Source images were also reformatted into 2 mm coronal and sagittal images.    CTDI volume: 57.3, 23.4 mGy  DLP: 1550.2 mGy-cm    COMPARISON:  None available    FINDINGS:  Brain ventricles and parenchyma: No evidence of intracranial hemorrhage or   mass lesion. No mass-effect or midline shift; basal cisterns are patent.   Ventricles and sulci normal for age. Mild atherosclerotic calcifications of   the intracranial internal carotid arteries, bilaterally.    Soft tissues and bones: Minimal soft tissue prominence and fat stranding   involving the scalp along the right lateral orbit. Otherwise the tissues of   the scalp are normal. No evidence of a calvarial or skull base fracture. Mild   hyperostosis frontalis interna. Incidentally noted unerupted right maxillary   molar. Right posterior maxillary small mucosal retention cyst. Remaining   visualized paranasal sinuses and mastoid air cells are clear.    IMPRESSION:  1. No evidence of acute intracranial abnormality, including no intracranial   hemorrhage, extra-axial fluid collection, midline shift, or hydrocephalus.    2. No evidence of a calvarial or skullbase fracture.    ==CT LOWER EXTREMITY  EXAM DESCRIPTION:  COMPUTED TOMOGRAPHY, LOWER EXTREMITY; WITHOUT CONTRAST MATERIAL-LT(73700)   Qty:1    Technique: Noncontrast helical images of the left knee were obtained and   reformatted in the coronal sagittal and axial planes.    Total exam DLP: 287.40 mGy-cm    CT DI: 12.77 mGy    CLINICAL  HISTORY:  Fall from height, trauma concern for possible tibial plateau fracture versus   laxity on exam    COMPARISON:  Left knee radiographs 07/19/2016    FINDINGS:  There is no acute fracture or malalignment in the left knee. There is no   evidence for a fracture of the tibial plateau. The distal femur in the patella  are also normal. There is a moderate joint effusion  with a small   lipohemarthrosis.    There is an antegrade intramedullary rod in the distal femur with a distal   locking screw. The screw track is seen more superiorly with callus formation   medially. There are mild to moderate tricompartmental degenerative changes   with joint space narrowing and osteophyte formation.    The extensor mechanism is unremarkable by CT. The anterior and posterior   cruciate ligaments are unremarkable by CT. There is mild diffuse soft tissue   swelling.    IMPRESSION:  Moderate joint effusion with a small lipohemarthrosis. However, no visible   fracture in the distal femur or proximal tibia. Unremarkable appearance of the  patella.    Antegrade intramedullary rod in the distal femur, incompletely assessed.    Mild to moderate tricompartmental degenerative changes in the left knee.    ==07/20/2016 XRAY SHOULDER  EXAM DESCRIPTION:  SHOULDER CMPL-LT(73030) Qty:1  SHOULDER COMPLETE MIN 2 VIEWS- LEFT    CLINICAL HISTORY:  Ecchymosis    TECHNIQUE:  3 images of the left shoulder.    COMPARISON:  None available.    FINDINGS:  Evaluation is suboptimal due to patient positioning. No evidence of acute   fracture or dislocation. The partially visualized glenohumeral joint is   congruent. There is widening of the acromioclavicular joint which measures 11   mm, concerning for acromioclavicular joint injury. There is a small focal   calcification adjacent to the greater tuberosity, which may represent   hydroxyapatite crystal deposition in the rotator cuff.    IMPRESSION:  No evidence of acute fracture.    Widening of the  acromioclavicular joint which measures 11 mm, concerning for   acromioclavicular joint injury.    Probable hydroxyapatite crystal deposition in the rotator cuff.    CONCURRENT SUPERVISION:  I have reviewed the images and agree with the fellow's report.    ==XRAY HAND  EXAM DESCRIPTION:  HAND CMPL-LT(73130) Qty:1  HAND MINIMUM 3 VIEWS- LEFT    CLINICAL HISTORY:  Tenderness    TECHNIQUE:  PA , oblique and lateral views of the left hand were obtained.    COMPARISON:  None available.    FINDINGS:  No evidence of acute fracture or dislocation. There is mild scattered   polyarticular osteoarthrosis, at the distal interphalangeal joints and at the   first carpometacarpal joint. Bone mineralization is decreased. There is mild   soft tissue swelling.    IMPRESSION:  No acute osseous abnormality.    Mild polyarticular osteoarthrosis.    CONCURRENT SUPERVISION:  I have reviewed the images and agree with the fellow's report.    ==07/20/16 KNEE XRAY  EXAM DESCRIPTION:  KNEE RUEA-VW(09811) Qty:1  KNEE 1 OR 2 VIEWS- RIGHT    CLINICAL HISTORY:  Swelling    TECHNIQUE:  Standing AP and lateral views of the right knee. 3 images total.    COMPARISON:  None available.    FINDINGS:  No evidence of acute fracture or dislocation.    There is a total knee arthroplasty in place, which is suboptimally profiled   due to patient positioning. No definite evidence of hardware complications.   There is no acute fracture.      There are corticated ossific fragments in the posterior recess of the knee   joint, which may represent intra-articular bodies. There is an enthesophyte at  the quadriceps tendon insertion. There is a small/moderate sized knee joint   effusion. There are vascular calcifications. Bone mineralization is decreased.  There is anterior soft  tissue swelling.    IMPRESSION:  No acute osseous abnormality.    Status post total knee arthroplasty without definite hardware complications.    Intra-articular osseous body in the  posterior recess of the knee joint.    Small/moderate-sized knee joint effusion. Anterior soft tissue swelling.    CONCURRENT SUPERVISION:  I have reviewed the images and agree with the fellow's report.
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
## 2016-07-23 NOTE — Discharge Instructions (Signed)
Diagnosis and Reason for Admission    You were admitted to the hospital for the following reason(s): Trauma, fall    Your full diagnosis list is located on this After Visit Summary in the Hospital Problems section.    What Happened During Your Hospital Stay  The main tests and treatments done for you during this hospitalization were:    - ATLS survey/ protocol  - Imaging: Xray chest/ pelvis/T,L-spine/L knee/L hip/L femur/L hand/Lshoulder, Ultrasound abdomen/ chest, CT head/ cspine/L leg  - You were found to have a broken left leg and Orthopedic surgery took you to the operating room to fix it with intramedullary rodding    Instructions for After Discharge  Your diet at home should be:  regular diet.     Your activity level at home should be:  Stay active, don't over do it. Make good choices avoiding alcohol or other substances that may cloud your judgement.    Specific activity restrictions:    Do not drive while taking narcotic pain medications.  Do not work with heavy or complex machinery while taking narcotic pain medications.  Do not lift more than 5 pounds for a period of 2 weeks.  Weight-bearing as tolerated in your left leg with a knee immobilizer    Wound or tube care instructions:  Keep area clean and dry.  Do not submerge area under water for 21 days.  OK to shower, do not scrub wounds, cover any splints or bandages with plastic bag & tape. Keep the splint/cast clean and dry.  You may use an ice pack on the area as needed for comfort.    Your medication list is located on this After Visit Summary in the Current Discharge Medication List section.  Your nurse will review this information with you before you leave the hospital.    Key changes to your medication regimen include:   - Continue taking your current home medications as prescribed by your primary care physician.  - Tylenol 650 mg (2 tablets) by mouth every 6 hours as needed for mild-moderate pain  - Lovenox injection under the skin every twelve  hours for the next 4 weeks  - Docusate (Colace) 250 mg tablet once a day for as long as you are taking opiate medications    It is very important for you to keep a current medication list with you in order to assist your doctors with your medical care.  Bring this After Visit Summary with you to your follow up appointments.    Reasons to Contact a Doctor Urgently  Contact your primary care physician or clinic, or return to the nearest Emergency Department if:  Increased or uncontrolled pain.  Severe abdominal pain.  Nausea and vomiting.  Severe abdominal bloating.  Jaundice (yellowing of the skin and eyes).  Redness or swelling at the surgical (or wound) site.  Discharge or leakage from the surgical (or wound) site.  Fevers or chills.  Bleeding from surgical (or wound) site.  Shortness of breath or difficulty breathing.  Chest pains or palpitations.    If you have any questions about your hospital care, your medications, or if you have new or concerning symptoms soon after going home from the hospital, and you need to contact your hospital physician, contact the [facility] operator at [phone].    Once you are able to see your primary care physician (PCP) or primary clinic, they will then be responsible for further medication refills, or appointment referrals.  What Needs to Happen Next After Discharge -- Appointments and Follow Up    Any appointments already scheduled at [facility] clinics will be listed in the Future Appointments section at the top of this After Visit Summary.  Any appointments that have been requested, but have not yet been scheduled, will be listed below that under Post Discharge Referrals.    Brief follow up appointment for:    Orthopedic surgery post-op check 11/14 at 0800    Medical Home Information    Your primary care provider or clinic currently on file at [facility]    Handouts Given to You (if applicable)

## 2016-07-24 ENCOUNTER — Encounter (HOSPITAL_COMMUNITY): Payer: Self-pay | Admitting: Emergency Medicine

## 2016-07-28 ENCOUNTER — Encounter (HOSPITAL_COMMUNITY): Payer: Self-pay | Admitting: Emergency Medicine

## 2016-08-04 ENCOUNTER — Encounter (HOSPITAL_BASED_OUTPATIENT_CLINIC_OR_DEPARTMENT_OTHER): Payer: Self-pay | Admitting: Orthopaedic Surgery

## 2016-08-04 DIAGNOSIS — S72142D Displaced intertrochanteric fracture of left femur, subsequent encounter for closed fracture with routine healing: Principal | ICD-10-CM

## 2016-08-05 ENCOUNTER — Ambulatory Visit (HOSPITAL_BASED_OUTPATIENT_CLINIC_OR_DEPARTMENT_OTHER): Payer: 59 | Admitting: Orthopaedic Surgery

## 2018-01-02 ENCOUNTER — Ambulatory Visit (INDEPENDENT_AMBULATORY_CARE_PROVIDER_SITE_OTHER): Admitting: Physician Assistant

## 2018-01-05 ENCOUNTER — Emergency Department
Admission: EM | Admit: 2018-01-05 | Discharge: 2018-01-05 | Discharge status: Against Medical Advice | Payer: Medicare Other | Attending: Emergency Medicine | Admitting: Emergency Medicine

## 2018-01-05 ENCOUNTER — Emergency Department
Admission: EM | Admit: 2018-01-05 | Discharge: 2018-01-05 | Disposition: A | Discharge status: Home / Self Care | Payer: Medicare Other | Attending: Emergency Medicine | Admitting: Emergency Medicine

## 2018-01-05 ENCOUNTER — Ambulatory Visit (INDEPENDENT_AMBULATORY_CARE_PROVIDER_SITE_OTHER): Admitting: Physician Assistant

## 2018-01-05 ENCOUNTER — Encounter (INDEPENDENT_AMBULATORY_CARE_PROVIDER_SITE_OTHER): Payer: Self-pay | Admitting: Physician Assistant

## 2018-01-05 VITALS — BP 145/94 | HR 62 | Temp 97.4°F | Resp 16 | Ht 65.0 in | Wt 192.0 lb

## 2018-01-05 DIAGNOSIS — F17200 Nicotine dependence, unspecified, uncomplicated: Secondary | ICD-10-CM | POA: Insufficient documentation

## 2018-01-05 DIAGNOSIS — R109 Unspecified abdominal pain: Secondary | ICD-10-CM | POA: Insufficient documentation

## 2018-01-05 DIAGNOSIS — F319 Bipolar disorder, unspecified: Secondary | ICD-10-CM | POA: Insufficient documentation

## 2018-01-05 DIAGNOSIS — K921 Melena: Secondary | ICD-10-CM | POA: Insufficient documentation

## 2018-01-05 DIAGNOSIS — Z885 Allergy status to narcotic agent status: Secondary | ICD-10-CM | POA: Insufficient documentation

## 2018-01-05 DIAGNOSIS — Z5321 Procedure and treatment not carried out due to patient leaving prior to being seen by health care provider: Secondary | ICD-10-CM | POA: Insufficient documentation

## 2018-01-05 DIAGNOSIS — E669 Obesity, unspecified: Secondary | ICD-10-CM | POA: Insufficient documentation

## 2018-01-05 DIAGNOSIS — Z888 Allergy status to other drugs, medicaments and biological substances status: Secondary | ICD-10-CM | POA: Insufficient documentation

## 2018-01-05 DIAGNOSIS — I10 Essential (primary) hypertension: Secondary | ICD-10-CM | POA: Insufficient documentation

## 2018-01-05 DIAGNOSIS — Z79899 Other long term (current) drug therapy: Secondary | ICD-10-CM | POA: Insufficient documentation

## 2018-01-05 DIAGNOSIS — Z90710 Acquired absence of both cervix and uterus: Secondary | ICD-10-CM | POA: Insufficient documentation

## 2018-01-05 DIAGNOSIS — Z9884 Bariatric surgery status: Secondary | ICD-10-CM | POA: Insufficient documentation

## 2018-01-05 DIAGNOSIS — E785 Hyperlipidemia, unspecified: Secondary | ICD-10-CM | POA: Insufficient documentation

## 2018-01-05 DIAGNOSIS — M797 Fibromyalgia: Secondary | ICD-10-CM

## 2018-01-05 DIAGNOSIS — F209 Schizophrenia, unspecified: Secondary | ICD-10-CM | POA: Insufficient documentation

## 2018-01-05 DIAGNOSIS — Z6832 Body mass index (BMI) 32.0-32.9, adult: Secondary | ICD-10-CM | POA: Insufficient documentation

## 2018-01-05 LAB — CBC WITH DIFF, BLOOD
ANC-Automated: 5.7 10*3/uL (ref 1.6–7.0)
Abs Basophils: 0 10*3/uL (ref <0.1)
Abs Eosinophils: 0.1 10*3/uL (ref 0.1–0.5)
Abs Lymphs: 1.5 10*3/uL (ref 0.8–3.1)
Abs Monos: 0.8 10*3/uL (ref 0.2–0.8)
Basophils: 0 %
Eosinophils: 1 %
Hct: 39.3 % (ref 34.0–45.0)
Hgb: 12.9 gm/dL (ref 11.2–15.7)
Lymphocytes: 19 %
MCH: 27.6 pg (ref 26.0–32.0)
MCHC: 32.8 g/dL (ref 32.0–36.0)
MCV: 84 um3 (ref 79.0–95.0)
MPV: 9.9 fL (ref 9.4–12.4)
Monocytes: 10 %
Plt Count: 260 10*3/uL (ref 140–370)
RBC: 4.68 10*6/uL (ref 3.90–5.20)
RDW: 15.6 % — ABNORMAL HIGH (ref 12.0–14.0)
Segs: 70 %
WBC: 8.2 10*3/uL (ref 4.0–10.0)

## 2018-01-05 LAB — COMPREHENSIVE METABOLIC PANEL, BLOOD
ALT (SGPT): 18 U/L (ref 0–33)
AST (SGOT): 18 U/L (ref 0–32)
Albumin: 4.1 g/dL (ref 3.5–5.2)
Alkaline Phos: 103 U/L (ref 35–140)
Anion Gap: 12 mmol/L (ref 7–15)
BUN: 12 mg/dL (ref 8–23)
Bicarbonate: 29 mmol/L (ref 22–29)
Bilirubin, Tot: 0.27 mg/dL (ref <1.2)
Calcium: 9.4 mg/dL (ref 8.5–10.6)
Chloride: 105 mmol/L (ref 98–107)
Creatinine: 0.76 mg/dL (ref 0.51–0.95)
GFR: >60 mL/min
Glucose: 86 mg/dL (ref 70–99)
Potassium: 4 mmol/L (ref 3.5–5.1)
Sodium: 146 mmol/L — ABNORMAL HIGH (ref 136–145)
Total Protein: 6.6 g/dL (ref 6.0–8.0)

## 2018-01-05 LAB — HIV 1/2 ANTIBODY & P24 ANTIGEN ASSAY, BLOOD: HIV 1/2 Antibody & P24 Antigen Assay: NONREACTIVE

## 2018-01-05 LAB — PROTHROMBIN TIME, BLOOD
INR: 1.1
PT,Patient: 12.4 s (ref 9.7–12.5)

## 2018-01-05 LAB — LACTATE, BLOOD: Lactate: 1 mmol/L (ref 0.5–2.0)

## 2018-01-05 LAB — APTT, BLOOD: PTT: 38 s — ABNORMAL HIGH (ref 25–34)

## 2018-01-05 LAB — LIPASE, BLOOD: Lipase: 32 U/L (ref 13–60)

## 2018-01-05 MED ORDER — HYDROCODONE-ACETAMINOPHEN 7.5-325 MG OR TABS
ORAL_TABLET | ORAL | Status: DC
Start: 2017-12-18 — End: 2018-01-05

## 2018-01-05 MED ORDER — CYCLOBENZAPRINE HCL 10 MG OR TABS
10.00 mg | ORAL_TABLET | ORAL | Status: DC
Start: 2017-12-09 — End: 2018-04-20

## 2018-01-05 MED ORDER — HYDROCODONE-ACETAMINOPHEN 5-325 MG OR TABS
1.00 | ORAL_TABLET | ORAL | Status: DC
Start: ? — End: 2018-01-05

## 2018-01-05 MED ORDER — GENERIC EXTERNAL MEDICATION
12.50 mg | Status: DC
Start: ? — End: 2018-01-05

## 2018-01-05 MED ORDER — SODIUM CHLORIDE 0.9 % IV SOLN
500.00 mL | INTRAVENOUS | Status: DC
Start: ? — End: 2018-01-05

## 2018-01-05 MED ORDER — PENICILLIN V POTASSIUM 500 MG OR TABS
ORAL_TABLET | ORAL | Status: DC
Start: 2017-12-25 — End: 2018-01-05

## 2018-01-05 MED ORDER — TOBRAMYCIN-DEXAMETHASONE 0.3-0.1 % OP OINT
TOPICAL_OINTMENT | OPHTHALMIC | Status: DC
Start: ? — End: 2018-04-20

## 2018-01-05 MED ORDER — FAMOTIDINE 20 MG/2ML IV SOLN
20.00 mg | INTRAVENOUS | Status: DC
Start: ? — End: 2018-01-05

## 2018-01-05 MED ORDER — MORPHINE SULFATE (PF) 4 MG/ML IV SOLN
8.00 mg | INTRAVENOUS | Status: DC
Start: ? — End: 2018-01-05

## 2018-01-05 MED ORDER — ACETAMINOPHEN 325 MG PO TABS
975.0000 mg | ORAL_TABLET | Freq: Once | ORAL | Status: AC
Start: 2018-01-05 — End: 2018-01-05
  Administered 2018-01-05: 975 mg via ORAL
  Filled 2018-01-05: qty 3

## 2018-01-05 MED ORDER — TOBRAMYCIN-DEXAMETHASONE 0.3-0.1 % OP SUSP
OPHTHALMIC | Status: DC
Start: 2018-01-01 — End: 2018-04-20

## 2018-01-05 MED ORDER — AMOXICILLIN-POT CLAVULANATE 875-125 MG OR TABS
1.00 | ORAL_TABLET | ORAL | Status: DC
Start: ? — End: 2018-01-05

## 2018-01-05 MED ORDER — METHOCARBAMOL 500 MG OR TABS
ORAL_TABLET | ORAL | Status: DC
Start: 2017-12-18 — End: 2018-04-20

## 2018-01-05 MED ORDER — AMOXICILLIN-POT CLAVULANATE 875-125 MG OR TABS
ORAL_TABLET | ORAL | 0 refills | Status: DC
Start: 2017-12-09 — End: 2018-01-05

## 2018-01-05 MED ORDER — LANSOPRAZOLE 30 MG OR CPDR
30.0000 mg | DELAYED_RELEASE_CAPSULE | Freq: Two times a day (BID) | ORAL | 3 refills | Status: DC
Start: 2018-01-05 — End: 2018-04-20

## 2018-01-05 MED ORDER — HYDROMORPHONE HCL 1 MG/ML IJ SOLN
1.0000 mg | Freq: Once | INTRAMUSCULAR | Status: AC
Start: 2018-01-05 — End: 2018-01-05
  Administered 2018-01-05: 1 mg via INTRAVENOUS
  Filled 2018-01-05 (×2): qty 1

## 2018-01-05 MED ORDER — HYDROCODONE-ACETAMINOPHEN 5-325 MG OR TABS
ORAL_TABLET | ORAL | Status: DC
Start: 2017-12-09 — End: 2018-04-20

## 2018-01-05 MED ORDER — LORAZEPAM 1 MG OR TABS
1.0000 mg | ORAL_TABLET | Freq: Once | ORAL | Status: AC
Start: 2018-01-05 — End: 2018-01-05
  Administered 2018-01-05: 1 mg via ORAL
  Filled 2018-01-05: qty 1

## 2018-01-05 MED ORDER — LYRICA 150 MG OR CAPS
ORAL_CAPSULE | ORAL | Status: DC
Start: 2017-12-09 — End: 2018-01-05

## 2018-01-05 MED ORDER — LANSOPRAZOLE 30 MG OR CPDR
30.0000 mg | DELAYED_RELEASE_CAPSULE | Freq: Once | ORAL | Status: AC
Start: 2018-01-05 — End: 2018-01-05
  Administered 2018-01-05: 30 mg via ORAL
  Filled 2018-01-05: qty 1

## 2018-01-05 MED ORDER — SODIUM CHLORIDE 0.9 % IV SOLN
8.0000 mg/h | INTRAVENOUS | Status: DC
Start: 2018-01-05 — End: 2018-01-05
  Filled 2018-01-05: qty 80

## 2018-01-05 MED ORDER — CYCLOBENZAPRINE HCL 10 MG OR TABS
10.00 mg | ORAL_TABLET | Freq: Two times a day (BID) | ORAL | 1 refills | Status: DC | PRN
Start: 2017-12-09 — End: 2018-01-05

## 2018-01-05 MED ORDER — LYRICA 150 MG OR CAPS
150.0000 mg | ORAL_CAPSULE | Freq: Every day | ORAL | 0 refills | Status: DC
Start: 2018-01-05 — End: 2018-01-19

## 2018-01-05 MED ORDER — LACTATED RINGERS IV SOLN
Freq: Once | INTRAVENOUS | Status: AC
Start: 2018-01-05 — End: 2018-01-05
  Administered 2018-01-05: 15:00:00 via INTRAVENOUS

## 2018-01-05 NOTE — Discharge Instructions (Signed)
Abdominal Pain    You have been diagnosed with abdominal (belly) pain. The cause of your pain is not yet known.    Many things can cause abdominal pain. Examples include viral infections and bowel (intestine) spasms. You might need another examination or more tests to find out why you have pain.    At this time, your pain does not seem to be caused by anything dangerous. You do not need surgery. You do not need to stay in the hospital.     Though we don't believe your condition is dangerous right now, it is important to be careful. Sometimes a problem that seems mild can become serious later. This is why it is very important that you return here or go to the nearest Emergency Department unless you are 100% improved.   Follow up with your physician.    Drink only clear liquids such as water, clear broth, sports drinks, or clear caffeine-free soft drinks, like 7-Up or Sprite, for the next:   24 hours.    YOU SHOULD SEEK MEDICAL ATTENTION IMMEDIATELY, EITHER HERE OR AT THE NEAREST EMERGENCY DEPARTMENT, IF ANY OF THE FOLLOWING OCCURS:   Your pain does not go away or gets worse.   You cannot keep fluids down or your vomit is dark green.    You vomit blood or see blood in your stool. Blood might be bright red or dark red. It can also be black and look like tar.   You have a fever (temperature higher than 100.4F / 38C) or shaking chills.   Your skin or eyes look yellow or your urine looks brown.   You have severe diarrhea.

## 2018-01-05 NOTE — ED Provider Notes (Signed)
Emergency Department Note  [place] electronic medical record reviewed for pertinent medical history.     Nursing Triage Note:   Chief Complaint   Patient presents with    Abdominal Pain     BIBA pt complaining of peri umbilical pain x 1 week, pt seen at [facility] yesterday, d/c's told to follow up with pcp, pt following up with PCP today but unable to control pain and instructed to call 911. pt also complaining of black tarry stools for 2 days     HPI:   62 year old female with a PMH significant for bipolar, prior gastric bypass (2007), HTN, HLD who presents with abdominal pain. Patient states 1 week of progressively worsening umbilical pain, sharp. [facility], patient went to PCP office with continued PO intolerance thus EMS activated for ER evaluation today. States not able to drink water, comes right up, also states black/tarry stools 5-6 episodes per day for the last few days. Denies recent abx or hospitalizations. Does not follow with anyone after her gastric surgery.    Past Medical History:   Diagnosis Date    Amphetamine abuse (CMS-HCC)     Bipolar 1 disorder (CMS-HCC)     Bipolar affective (CMS-HCC)     Fracture, intertrochanteric, left femur (CMS-HCC)     Gastric bypass status for obesity     H/O: hysterectomy     HLD (hyperlipidemia)     HTN (hypertension)     Manic depression (CMS-HCC)     Obesity     Opioid abuse (CMS-HCC)     Schizophrenia (CMS-HCC)        Past Surgical History:   Procedure Laterality Date    GASTRIC BYPASS      hx gastric bypass      s/p IMN left IT fracture 07/20/16      STOMACH SURGERY      Ventral Hernia repair         Family History   Problem Relation Name Age of Onset    No Known Problems Mother         Social History:  Hx of meth use    Medications:   Prior to Admission Medications   Prescriptions Last Dose Informant Patient Reported? Taking?   HYDROcodone-acetaminophen (NORCO) 5-325 MG tablet   Yes No   LYRICA 150 MG capsule   Yes  No   acetaminophen (TYLENOL) 325 MG tablet   No No   Sig: Take 2 tablets (650 mg) by mouth every 4 hours as needed for Mild Pain (Pain Score 1-3).   amLODIPINE (NORVASC) 2.5 MG tablet   No No   Sig: Take 1 tablet (2.5 mg) by mouth every morning (with breakfast).   buPROPion (WELLBUTRIN) 100 MG tablet   Yes No   Sig: Take 100 mg by mouth 3 times daily.   carisoprodol (SOMA) 350 MG tablet   Yes No   Sig: Take 350 mg by mouth every 6 hours.   cyclobenzaprine (FLEXERIL) 10 MG tablet   Yes No   Sig: 10 mg by Oral route.   diphenhydrAMINE (BENADRYL) 50 MG/ML injection   No No   Sig: Inject 0.5 mLs (25 mg) into vein every 6 hours as needed (itching).   docusate sodium (COLACE) 250 MG capsule   No No   Sig: Take 1 capsule by mouth daily.   docusate sodium (COLACE) 250 MG capsule   No No   Sig: Take 1 capsule (250 mg) by  mouth nightly.   hydrocortisone 2.5 % ointment   No No   Sig: Apply 1 Application topically 2 times daily. Use a small amount as directed   lansoprazole (PREVACID) 30 MG capsule   No No   Sig: Take 1 capsule by mouth 2 times daily (before meals).   lansoprazole (PREVACID) 30 MG capsule   No No   Sig: Take 1 capsule (30 mg) by mouth every morning (before breakfast).   methadone (DOLOPHINE) 10 MG tablet   Yes No   Sig: Take 30 mg by mouth daily.   methocarbamol (ROBAXIN) 500 MG tablet   Yes No   methocarbamol (ROBAXIN) 750 MG tablet   No No   Sig: Take 1 tablet (750 mg) by mouth 3 times daily.   nalOXone (NARCAN) 0.4 MG/ML injection   No No   Sig: Inject 0.25 mLs (0.1 mg) into vein every 2 minutes as needed (somnolence).   pantoprazole (PROTONIX) 20 MG tablet   Yes No   Sig: Take 20 mg by mouth daily.   senna (SENOKOT) 8.6 MG tablet   No No   Sig: Take 1 tablet by mouth daily as needed for Constipation.   senna (SENOKOT) 8.6 MG tablet   No No   Sig: Take 2 tablets (17.2 mg) by mouth every morning.   sodium chloride 0.9 % solution   No No   Sig: Inject 10 mL/hr into vein continuous.   tobramycin-dexamethasone  (TOBRADEX) ophthalmic ointment   Yes No   Sig: 3 (three) times a day.   tobramycin-dexamethasone (TOBRADEX) ophthalmic suspension   Yes No      Facility-Administered Medications: None       Allergies: Compazine; Compazine; Haldol [haloperidol]; Ondansetron; Reglan [kdc:yellow dye+ci pigment blue 63+metoclopramide]; Reglan [metoclopramide]; Toradol; Toradol [ketorolac tromethamine]; Tramadol; Ultram [tramadol]; Zofran [fd&c yellow #6 [place]-ondansetron]; Zofran [ondansetron]; Reglan [metoclopramide]; Toradol; Tramadol; Tramadol; and Zofran [ondansetron]    Review of Systems:   Review of Systems   Constitutional: Negative for chills, diaphoresis, fatigue and fever.   Respiratory: Negative for cough, shortness of breath and wheezing.    Cardiovascular: Negative for chest pain, palpitations and leg swelling.   Gastrointestinal: Positive for abdominal pain, blood in stool, diarrhea, nausea and vomiting. Negative for abdominal distention and constipation.   Genitourinary: Negative for dysuria, flank pain, hematuria and urgency.   Skin: Negative for rash and wound.   Neurological: Negative for weakness, light-headedness and headaches.   Psychiatric/Behavioral: Negative for hallucinations, self-injury and suicidal ideas.     All other systems reviewed and negative unless otherwise noted in the HPI or above. This was done per my custom and practice for systems appropriate to the chief complaint in an emergency department setting and varies depending on the quality of history that the patient is able to provide.      Physical Exam:   01/05/18  1417   BP: 135/97   Pulse: 55   Resp: 22   Temp: 98.5 F (36.9 C)   SpO2: 97%     Nursing note and vitals reviewed.     Physical Exam   Constitutional: She is oriented to person, place, and time. She appears well-developed and well-nourished.   HENT:   Head: Normocephalic and atraumatic.   Mouth/Throat: Oropharynx is clear and moist.   Eyes: EOM are normal. Pupils are equal, round,  and reactive to light.   Cardiovascular: Normal rate, regular rhythm, normal heart sounds and intact distal pulses.   Pulmonary/Chest: Effort normal and breath  sounds normal. No respiratory distress. She has no wheezes. She has no rales.   Abdominal: Soft. She exhibits no distension. There is tenderness. There is guarding (suprapubic). There is no rebound.   Hypoactive BS   Genitourinary: Rectal exam shows guaiac positive stool (dark stool, + FOB).   Neurological: She is alert and oriented to person, place, and time.   Skin: Skin is warm and dry.   Psychiatric: She has a normal mood and affect. Her behavior is normal. Thought content normal.     Impression & Initial ED Plan:  62 year old female with a PMH significant for bipolar, prior gastric bypass (2007), HTN, HLD who presents with abdominal pain and melena. Concern for gastric ulceration, upper GIB. Overall low suspicion for internal hernia/obstruction as cause of her symptoms as reassuring that her workup at [facility] yesterday was unremarkable (including a negative CTAP thus will defer repeat imaging today). HDS at this time, doubt upper GI hemorrhage. Will monitor, send labs today. Will provide with pain and nausea control in the meantime, reassess closely.    Labs Reviewed   COMPREHENSIVE METABOLIC PANEL, BLOOD   LACTATE, BLOOD    Narrative:     Nurse may discontinue the second lactate order if the results  of the first are normal (serum lactate less than or equal to  2 mmol / L).   LIPASE, BLOOD   CBC WITH DIFF, BLOOD   APTT, BLOOD   PROTHROMBIN TIME, BLOOD   TYPE & SCREEN   HIV 1/2 ANTIBODY & P24 ANTIGEN ASSAY, BLOOD       Medications   lactated ringers 1,000 mL IV bolus ( IntraVENOUS New Bag 01/05/18 1445)   HYDROmorphone (DILAUDID) injection 1 mg (1 mg IntraVENOUS Given 01/05/18 1445)     The rest of the ED course, results, and plan for the patient is in a separate continuation note. Please see that note for details.     I have discussed my evaluation and care  plan for the patient with the attending physician Dr. Hinda Lenis.     Shelda Jakes, MD  Resident  01/05/18 1626       Delman Kitten, MD  01/05/18 [phone]

## 2018-01-05 NOTE — ED Notes (Signed)
pts arrives with dog to this facility, while caring fo pt pts dog biting this Clinical research associatewriter, pt verbal redirecting dog, pt offering daughter to come pick up patient, pts daughter called and offering to pick up dog after work around 1700-1900.

## 2018-01-05 NOTE — ED MD Progress Note (Signed)
Attempted to see patient in triage, unable to locate patient in waiting room or hallway

## 2018-01-05 NOTE — ED Notes (Signed)
utl in wr

## 2018-01-05 NOTE — ED Notes (Signed)
Dr sadler requesting pt to be brought back to triage chairs to be seen with soc worker  2040; UTL in w.r, restroom, hallways or outside to bring back to triage area

## 2018-01-05 NOTE — ED MD Progress Note (Signed)
MIS on call paged to notify that patient with prior bariatric surgery is being treated in the ER. Awaiting response.

## 2018-01-05 NOTE — ED MD Progress Note (Signed)
On reassessment patient complaining of ongoing pain, but noted to be sleeping comfortably and playing with her dog in the gurney when walking past her bed. Also ate a sandwich, tolerated all PO pills, and drank juice without difficulty. On my repeat abd exam non tender to pressure over abdomen with stethoscope.     Spoke with surgery resident who stated patient does not need any surgical intervention today and did not feel repeat imaging was indicated today.     Given melena on exam will refer to GI for urgent scope.  In setting of HD stability and normal CBC and patient tolerating PO here, okay for outpatient workup of her melena.

## 2018-01-05 NOTE — ED Notes (Signed)
PRN note: AVS signed and copy provided with RX x2 and referral. Patient verbalized understanding and left to exit via her personal electric w/c.

## 2018-01-05 NOTE — ED Notes (Signed)
Pt provided with sandwich juice, and jello, pt tolerating food with out incident, PIV removed per pt request. Pt provided with clarification of plan of care pending D/c, pt endorsing GI MD stating pt needing admission, this writer [facility] for outpatient GI follow up no admission at this time. PIV removed per pt request pt tolerating procedure well.

## 2018-01-05 NOTE — ED Notes (Signed)
2220: UTL in w.r, restroom, hallways or outside to be seen in triage chairs by dr sadler  Pt did not inform staff she was leaving  I did not have contact w/ this pt
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
## 2018-01-05 NOTE — Consults (Signed)
GENERAL SURGERY CONSULT    Reason for consult:  Abdominal pain    History of Present Illness:     Alexandria Proctor is a 62 year old female with PMH significant for HTN and Roux-N-Y gastric bypass in 2007 c/b postop obstruction, who presents with one week of periumbilical abdominal pain and black tarry stools. The patient complains of worsening pain over the past week that is periumbilical/epigastric and nonradiating. She has associated nausea and NBND emesis. She has also had black tarry stools during this time. Last BM was yesterday. She had a RNYGB surgery in [place] in 2007 with immediate postop obstructive symptoms treated conservatively with NG tube decompression and has had no complication since then, though she has not followed up with a surgeon. She continues to take her PPI. She had a colonoscopy >10 years ago that we reports was normal. Otherwise denies fevers, chills, chest pain, shortness of breath, dysuria or hematuria, or other new symptoms.    In the ED the patient appeared uncomfortable but with stable vitals signs. Labs are within normal limits without leukocytosis. She was seen at [facility] yesterday for the same symptoms where a CT abdomen/pelvis was performed without evidence of obstruction or complications related to the bypass.    Past Medical and Surgical History:  Past Medical History:   Diagnosis Date    Amphetamine abuse (CMS-HCC)     Bipolar 1 disorder (CMS-HCC)     Bipolar affective (CMS-HCC)     Fracture, intertrochanteric, left femur (CMS-HCC)     Gastric bypass status for obesity     H/O: hysterectomy     HLD (hyperlipidemia)     HTN (hypertension)     Manic depression (CMS-HCC)     Obesity     Opioid abuse (CMS-HCC)     Schizophrenia (CMS-HCC)      Past Surgical History:   Procedure Laterality Date    GASTRIC BYPASS      hx gastric bypass      s/p IMN left IT fracture 07/20/16      STOMACH SURGERY      Ventral Hernia repair         Allergies:  Allergies   Allergen  Reactions    Compazine Anaphylaxis     Per patient tolerates promethazine/Phenergan    Compazine Rash    Haldol [Haloperidol] Swelling    Ondansetron Swelling    Reglan [Kdc:Yellow Dye+Ci Pigment Blue 63+Metoclopramide] Swelling    Reglan [Metoclopramide] Anaphylaxis    Toradol Anaphylaxis    Toradol [Ketorolac Tromethamine] Swelling    Tramadol Anaphylaxis     Per patient, tolerates hydromorphone (Dilaudid) and morphine     Ultram [Tramadol] Swelling     Tolerates Dilaudid    Zofran [Fd&C Yellow #6 [place]-Ondansetron] Swelling    Zofran [Ondansetron] Anaphylaxis    Reglan [Metoclopramide] Unspecified    Toradol Other     "my throat closes up and tongue swells up"    Tramadol Unspecified     Per patient, tolerates dilaudid and perocet    Tramadol Unspecified    Zofran [Ondansetron] Unspecified       Medications:  Current Facility-Administered Medications   Medication    pantoprazole (PROTONIX) 80 mg in sodium chloride 0.9 % 100 mL infusion     Current Outpatient Medications   Medication Sig    acetaminophen (TYLENOL) 325 MG tablet Take 2 tablets (650 mg) by mouth every 4 hours as needed for Mild Pain (Pain Score 1-3).  amLODIPINE (NORVASC) 2.5 MG tablet Take 1 tablet (2.5 mg) by mouth every morning (with breakfast).    buPROPion (WELLBUTRIN) 100 MG tablet Take 100 mg by mouth 3 times daily.    carisoprodol (SOMA) 350 MG tablet Take 350 mg by mouth every 6 hours.    cyclobenzaprine (FLEXERIL) 10 MG tablet 10 mg by Oral route.    diphenhydrAMINE (BENADRYL) 50 MG/ML injection Inject 0.5 mLs (25 mg) into vein every 6 hours as needed (itching).    docusate sodium (COLACE) 250 MG capsule Take 1 capsule (250 mg) by mouth nightly.    docusate sodium (COLACE) 250 MG capsule Take 1 capsule by mouth daily.    HYDROcodone-acetaminophen (NORCO) 5-325 MG tablet     hydrocortisone 2.5 % ointment Apply 1 Application topically 2 times daily. Use a small amount as directed    lansoprazole (PREVACID) 30  MG capsule Take 1 capsule (30 mg) by mouth every morning (before breakfast).    lansoprazole (PREVACID) 30 MG capsule Take 1 capsule by mouth 2 times daily (before meals).    LYRICA 150 MG capsule     methadone (DOLOPHINE) 10 MG tablet Take 30 mg by mouth daily.    methocarbamol (ROBAXIN) 500 MG tablet     methocarbamol (ROBAXIN) 750 MG tablet Take 1 tablet (750 mg) by mouth 3 times daily.    nalOXone (NARCAN) 0.4 MG/ML injection Inject 0.25 mLs (0.1 mg) into vein every 2 minutes as needed (somnolence).    pantoprazole (PROTONIX) 20 MG tablet Take 20 mg by mouth daily.    senna (SENOKOT) 8.6 MG tablet Take 2 tablets (17.2 mg) by mouth every morning.    senna (SENOKOT) 8.6 MG tablet Take 1 tablet by mouth daily as needed for Constipation.    sodium chloride 0.9 % solution Inject 10 mL/hr into vein continuous.    tobramycin-dexamethasone (TOBRADEX) ophthalmic ointment 3 (three) times a day.    tobramycin-dexamethasone (TOBRADEX) ophthalmic suspension        Social History:  Social History     Socioeconomic History    Marital status: Single     Spouse name: Not on file    Number of children: Not on file    Years of education: Not on file    Highest education level: Not on file   Occupational History    Occupation: unemployed   Tobacco Use    Smoking status: Current Every Day Smoker   Substance and Sexual Activity    Alcohol use: Yes     Alcohol/week: 0.0 oz    Drug use: Yes     Types: Methamphetamines, Marijuana    Sexual activity: Not Currently   Social Activities of Daily Living Present    Not on file   Social History Narrative    ** Merged History Encounter **         ** Merged History Encounter **         ** Merged History Encounter **            Family History:  Family History   Problem Relation Name Age of Onset    No Known Problems Mother         Review of Systems:  Negative except as noted in HPI.    Physical Exam:  BP 135/97 (BP Location: Left arm, BP Patient Position: Lying right side)     Pulse 55    Temp 98.5 F (36.9 C)    Resp 22    Ht _0  (1.651  m)    Wt 87.1 kg (192 lb 0.3 oz)    SpO2 97%    BMI 31.95 kg/m     General: Awake, alert, appears intermittently uncomfortable but then redirectable  CV: RRR  Pulm: Unlabored breathing on room air  Abd: Soft, mildly distended, TTP superior to umbilicus, no rebound, voluntary guarding, well healed midline laparotomy incision without palpable masses or hernias  Ext: WWP, MAE  Neuro: A&Ox3, motor/sensory grossly intact    Labs and Other Data:  Lab Results   Component Value Date    NA 146 (H) 01/05/2018    K 4.0 01/05/2018    CL 105 01/05/2018    BICARB 29 01/05/2018    BUN 12 01/05/2018    CREAT 0.76 01/05/2018    GLU 86 01/05/2018    [place] 9.4 01/05/2018     Lab Results   Component Value Date    WBC 8.2 01/05/2018    HGB 12.9 01/05/2018    HCT 39.3 01/05/2018    PLT 260 01/05/2018    SEG 70 01/05/2018    LYMPHS 19 01/05/2018    MONOS 10 01/05/2018    EOS 1 01/05/2018     Lab Results   Component Value Date    AST 18 01/05/2018    ALT 18 01/05/2018    ALK 103 01/05/2018    TBILI 0.27 01/05/2018    TP 6.6 01/05/2018    ALB 4.1 01/05/2018     Lab Results   Component Value Date    INR 1.1 01/05/2018    PTT 38 (H) 01/05/2018     No components found for: ABG  No results found for: PHUA, SGUA, GLUCOSEUA, KETONEUA, BLOODUA, PROTEINUA, LEUKESTUA, NITRITEUA, Merlin, RBCUA, EPITHCELLSUA, CRYSTALSUA, COMMENTSUA    Micro:  n/a    Imaging:  CT Abdomen and Pelvis without Contrast (from OSH) 01/04/18    FINDINGS:  LIVER:Normal.No enlargement, atrophy, abnormal parenchymal density, or significant focal lesion.  BILIARY:No significant change, prior cholecystectomy.No visible dilatation or calcification.  PANCREAS:Normal.No lesion, fluid collection, ductal dilatation, or acute inflammation.  SPLEEN:Normal.No enlargement or focal lesion.  KIDNEYS:Normal.No mass, obstruction, or calcification.  ADRENALS:Normal.No mass or  enlargement.  AORTA/VASCULAR:Normal allowing for absence of intravenous contrast.  RETROPERITONEUM:Normal.No mass or adenopathy.  BOWEL/MESENTERY:No significant change. Status post bariatric surgery.No visible mass, obstruction, or bowel wall thickening.  ABDOMINAL WALL:Postoperative changes in the midline.  URINARY BLADDER:Normal.No visible focal wall thickening, lesion, or calculus.  PELVIC NODES:Normal.No adenopathy.  PELVIC ORGANS:No visible mass.  BONES:No significant change aside from internal fixation of a left femoral intratrochanteric fracture. Grade 1 degenerative anterolisthesis at L4-5.  LUNG BASES:Normal.No visible pulmonary or pleural disease.  OTHER:Negative.    Assessment and Recommendations:  62 year old female with PMH of Roux-N-Y gastric bypass in 2007 presenting with focal periumbilical abdominal pain, nausea/vomiting, and black tarry stools. Regarding her surgical history, abdominal pain could be secondary to marginal ulcer or any internal herniation. However CT scan performed at [facility] yesterday was negative for any obstructive abnormality. The patient is being admitted to the medicine service for workup of GI bleed and the etiology of this could be causing her abdominal pain. At this time, there is low suspicion for a postsurgical cause of her pain. Could consider repeating CT scan with IV contrast in the future if her pain persists and no other cause is identified.    - No acute surgical intervention indicated at this time  - Recommend continuing home PPI in the setting of past bariatric surgery  -  Recommend outpatient colonoscopy and/or EGD to evaluate for GI bleed and surgical anastomosis if that is a concern    Patient discussed with Dr. Elio Forget.    Burke Keels, MD  PGY-2 General Surgery   Pager [phone]   5:08 PM      .

## 2018-01-05 NOTE — Progress Notes (Signed)
Subjective:  Alexandria Proctor is a 62 year old female who is here for URI (feels weak with nasuea, no appetite , not able to keep fluids down, laong w/Fever)    62 yo female presents with 1 week of epigastric pain and nausea without vomiting. She reports unable to tolerate oral intake due to nausea. She reports associated fever of 100F.  Over the same amount of time se has had black tarry stools. She has been using tea and clear liquids for management of her symptoms.   Symptoms prompted her to go to ER yesterday, CT scan without contrast was negative for acute findings. CBC negative for anemia , CMP without evidence of dehydration.  She lives alone with dog     Patient ROS:  General see HPI  HEENT negative for headache  Cardiac negative for chest pain  Respiratory negative for Shortness of breath   GI See HPI  Skin Negative for rash       Current Outpatient Medications   Medication Sig Dispense Refill   . acetaminophen (TYLENOL) 325 MG tablet Take 2 tablets (650 mg) by mouth every 4 hours as needed for Mild Pain (Pain Score 1-3). 30 tablet 0   . amLODIPINE (NORVASC) 2.5 MG tablet Take 1 tablet (2.5 mg) by mouth every morning (with breakfast). 30 tablet 0   . buPROPion (WELLBUTRIN) 100 MG tablet Take 100 mg by mouth 3 times daily.     . carisoprodol (SOMA) 350 MG tablet Take 350 mg by mouth every 6 hours.     . cyclobenzaprine (FLEXERIL) 10 MG tablet 10 mg by Oral route.     . diphenhydrAMINE (BENADRYL) 50 MG/ML injection Inject 0.5 mLs (25 mg) into vein every 6 hours as needed (itching). 1 vial 0   . docusate sodium (COLACE) 250 MG capsule Take 1 capsule (250 mg) by mouth nightly. 60 capsule 0   . docusate sodium (COLACE) 250 MG capsule Take 1 capsule by mouth daily. 30 capsule 0   . HYDROcodone-acetaminophen (NORCO) 5-325 MG tablet      . hydrocortisone 2.5 % ointment Apply 1 Application topically 2 times daily. Use a small amount as directed 1 Tube 0   . lansoprazole (PREVACID) 30 MG capsule Take 1 capsule (30  mg) by mouth every morning (before breakfast). 30 capsule 0   . lansoprazole (PREVACID) 30 MG capsule Take 1 capsule by mouth 2 times daily (before meals). 30 capsule 3   . LYRICA 150 MG capsule      . methadone (DOLOPHINE) 10 MG tablet Take 30 mg by mouth daily.     . methocarbamol (ROBAXIN) 500 MG tablet      . methocarbamol (ROBAXIN) 750 MG tablet Take 1 tablet (750 mg) by mouth 3 times daily. 9 tablet 0   . nalOXone (NARCAN) 0.4 MG/ML injection Inject 0.25 mLs (0.1 mg) into vein every 2 minutes as needed (somnolence). 2 vial 0   . pantoprazole (PROTONIX) 20 MG tablet Take 20 mg by mouth daily.     Marland Kitchen senna (SENOKOT) 8.6 MG tablet Take 2 tablets (17.2 mg) by mouth every morning. 30 tablet 0   . senna (SENOKOT) 8.6 MG tablet Take 1 tablet by mouth daily as needed for Constipation. 20 tablet 0   . sodium chloride 0.9 % solution Inject 10 mL/hr into vein continuous. 100 mL 0   . tobramycin-dexamethasone (TOBRADEX) ophthalmic ointment 3 (three) times a day.     . tobramycin-dexamethasone (TOBRADEX) ophthalmic suspension  No current facility-administered medications for this visit.      Allergies   Allergen Reactions   . Compazine Anaphylaxis     Per patient tolerates promethazine/Phenergan   . Compazine Rash   . Haldol [Haloperidol] Swelling   . Ondansetron Swelling   . Reglan [Kdc:Yellow Dye+Ci Pigment Blue 63+Metoclopramide] Swelling   . Reglan [Metoclopramide] Anaphylaxis   . Toradol Anaphylaxis   . Toradol [Ketorolac Tromethamine] Swelling   . Tramadol Anaphylaxis     Per patient, tolerates hydromorphone (Dilaudid) and morphine    . Ultram [Tramadol] Swelling     Tolerates Dilaudid   . Zofran [Fd&C Yellow #6 [place]-Ondansetron] Swelling   . Zofran [Ondansetron] Anaphylaxis   . Reglan [Metoclopramide] Unspecified   . Toradol Other     "my throat closes up and tongue swells up"   . Tramadol Unspecified     Per patient, tolerates dilaudid and perocet   . Tramadol Unspecified   . Zofran [Ondansetron]  Unspecified       Reviewed pts pertinent information related to social history, past medical history, past surgical and family history.    Objective:  Vital signs: BP (!) 145/94 (BP Location: Left arm, BP Patient Position: Sitting, BP cuff size: Large)   Pulse 62   Temp 97.4 F (36.3 C)   Resp 16   Ht 5\' 5"  (1.651 m)   Wt 87.1 kg (192 lb)   BMI 31.95 kg/m     Physical Exam   Constitutional:   Appears unwell, writhing in pain    Cardiovascular: Normal heart sounds.   Pulmonary/Chest: Breath sounds normal.   Abdominal:   Prior surgical scars on abdomen, TTP, guarding noted        Assessment / Plan:   Alexandria Proctor was seen today for URI (feels weak with nasuea, no appetite , not able to keep fluids down, laong w/Fever)    Given pts presentation I do not feel she is suitable for discharge to home, paramedics called and pt was transferred to [facility] for further evaluation and management of her symptoms. DDX peptic ulcer disease, small bowel obstruction, ischemic colitis     Diagnosed with:    ICD-10-CM ICD-9-CM    1. Abdominal pain, unspecified abdominal location R10.9 789.00    2. Nausea R11.0 787.02      No orders of the defined types were placed in this encounter.

## 2018-01-05 NOTE — EMS Narrative (Addendum)
Pt Age: [age] Years; Gender: Female;  Primary Impression: Abdominal Pain/Problems;  Medical History: Behavior: Major Depressive Disorder/Major Depression, GI:   GERD/Reflux, Hypertension (HTN); Pt Medication: ; Pt Allergies: (No Known   Drug Allergies), ;  Base Called: [place]    CC:  Date/Time of Symptom Onset: 2019-04-16T00:00:00-07:00    HPI:  Primary Symptom: Generalized abdominal pain  Provider's Primary Impression: Generalized abdominal pain  Initial Patient Acuity: Lower Acuity Chilton Si(Green)    Alert:  Patient Care Report Number: [id number]  Incident Number: [id number]  EMS Vehicle (Unit) Number: [id number]  EMS Unit Call Sign: M1  Level of Care of This Unit: ALS-Paramedic  Incident Street Address: [address]  [place] [phone]  Incident ZIP Code: [phone]    Assessment:    Procedure - Arrest:  Cardiac Arrest: No    Procedure - Exam:    Procedure - Injury:    Procedure - Airway:    Procedure - Medications:    Procedure - Generic:  Date/Time Procedure Performed: 2019-04-16T13:45:00-07:00  Procedure Performed Prior to this Unit's EMS Care: No  Procedure: [id number]    Demographics History:    Demographics Practitioner:    Demographics Patient:  Last Name: Kennedy BuckerRousseau  First Name: Myriam JacobsonHelen  Patient's Home Address: [address]  Patient's Home City: [phone]  [place]: [phone]  Patient's Home State: 06  Patient's Home ZIP Code: [phone]  Patient's Country of Residence: [place]  Gender: Female  Race: Black or African American  Age: [age]  Age Units: Years    Demographics Times:  Unit Notified by Dispatch Date/Time: 2019-04-16T13:21:43-07:00  Unit En Route Date/Time: 2019-04-16T13:22:50-07:00  Unit Arrived on Scene Date/Time: 2019-04-16T13:26:31-07:00  Arrived at Patient Date/Time: 2019-04-16T13:30:52-07:00  Unit Left Scene Date/Time: 2019-04-16T13:37:35-07:00  Patient Arrived at Destination Date/Time: 2019-04-16T13:54:07-07:00    Demographics Payment:  Primary Method of Payment: Insurance

## 2018-01-05 NOTE — ED Notes (Addendum)
Assisting primary RN- pt states she feels like her IV infiltrated and wants it out. MD Benard HalstedShi aware.

## 2018-01-06 LAB — ECG 12-LEAD
ATRIAL RATE: 68 {beats}/min
ECG INTERPRETATION: NORMAL
P AXIS: 76 degrees
PR INTERVAL: 162 ms
QRS INTERVAL/DURATION: 96 ms
QT: 438 ms
QTC INTERVAL: 465 ms
R AXIS: -29 degrees
T AXIS: 74 degrees
VENTRICULAR RATE: 68 {beats}/min

## 2018-01-13 ENCOUNTER — Ambulatory Visit (HOSPITAL_BASED_OUTPATIENT_CLINIC_OR_DEPARTMENT_OTHER): Payer: Medicare Other | Admitting: Gastroenterology

## 2018-01-13 ENCOUNTER — Encounter

## 2018-01-15 ENCOUNTER — Ambulatory Visit (INDEPENDENT_AMBULATORY_CARE_PROVIDER_SITE_OTHER): Admitting: Medical

## 2018-01-15 ENCOUNTER — Other Ambulatory Visit (INDEPENDENT_AMBULATORY_CARE_PROVIDER_SITE_OTHER): Payer: Self-pay | Admitting: Physician Assistant

## 2018-01-15 NOTE — Telephone Encounter (Signed)
Pt came in office requesting refill on attached but states it shoold be 200 mg when I informed her we just refilled on 01/05/18 #30 150mg  but she states this is incorrect and it was given in the hospital .She is also going to call to schedule an appt with you or stop in schedule . Offered to do now but said she'll call or come back

## 2018-01-18 NOTE — Telephone Encounter (Signed)
We have never refilled, provider was Shelda Jakes, to my knowledge no providers here with that name, need pharmacy for refill and correct sig

## 2018-01-19 MED ORDER — PREGABALIN 200 MG OR CAPS
200.00 mg | ORAL_CAPSULE | Freq: Three times a day (TID) | ORAL | 0 refills | Status: DC
Start: 2018-01-19 — End: 2018-04-20

## 2018-01-19 NOTE — Telephone Encounter (Signed)
Patient stated the sig was take by mouth 3 times a day and I updated the pharmacy to the [facility] [address]

## 2018-01-19 NOTE — Telephone Encounter (Signed)
Lm to call back to schedule

## 2018-01-19 NOTE — Telephone Encounter (Signed)
rx sent, please schedule for follow up OV

## 2018-01-21 NOTE — Telephone Encounter (Signed)
Lm to call back

## 2018-01-25 NOTE — Telephone Encounter (Signed)
Lm to call back

## 2018-02-05 ENCOUNTER — Ambulatory Visit
Admission: RE | Admit: 2018-02-05 | Discharge: 2018-02-05 | Disposition: A | Discharge status: Home / Self Care | Payer: Medicare Other | Attending: Radiology | Admitting: Radiology

## 2018-02-05 ENCOUNTER — Other Ambulatory Visit (HOSPITAL_BASED_OUTPATIENT_CLINIC_OR_DEPARTMENT_OTHER): Payer: Self-pay | Admitting: Internal Medicine

## 2018-02-05 ENCOUNTER — Other Ambulatory Visit (HOSPITAL_BASED_OUTPATIENT_CLINIC_OR_DEPARTMENT_OTHER): Payer: Medicare Other

## 2018-02-05 DIAGNOSIS — J984 Other disorders of lung: Secondary | ICD-10-CM | POA: Insufficient documentation

## 2018-02-05 DIAGNOSIS — Z01818 Encounter for other preprocedural examination: Secondary | ICD-10-CM | POA: Insufficient documentation

## 2018-02-05 DIAGNOSIS — I700 Atherosclerosis of aorta: Secondary | ICD-10-CM | POA: Insufficient documentation

## 2018-02-05 LAB — COMPREHENSIVE METABOLIC PANEL, BLOOD
ALT (SGPT): 16 U/L (ref 0–33)
AST (SGOT): 19 U/L (ref 0–32)
Albumin: 4 g/dL (ref 3.5–5.2)
Alkaline Phos: 94 U/L (ref 35–140)
Anion Gap: 13 mmol/L (ref 7–15)
BUN: 13 mg/dL (ref 8–23)
Bicarbonate: 24 mmol/L (ref 22–29)
Bilirubin, Tot: <0.15 mg/dL (ref <1.2)
Calcium: 9.7 mg/dL (ref 8.5–10.6)
Chloride: 105 mmol/L (ref 98–107)
Creatinine: 0.89 mg/dL (ref 0.51–0.95)
GFR: >60 mL/min
Glucose: 68 mg/dL — ABNORMAL LOW (ref 70–99)
Potassium: 4 mmol/L (ref 3.5–5.1)
Sodium: 142 mmol/L (ref 136–145)
Total Protein: 7.1 g/dL (ref 6.0–8.0)

## 2018-02-05 LAB — CBC WITH DIFF, BLOOD
ANC-Automated: 5.4 10*3/uL (ref 1.6–7.0)
Abs Basophils: 0 10*3/uL (ref <0.1)
Abs Eosinophils: 0.2 10*3/uL (ref 0.1–0.5)
Abs Lymphs: 1.5 10*3/uL (ref 0.8–3.1)
Abs Monos: 0.9 10*3/uL — ABNORMAL HIGH (ref 0.2–0.8)
Basophils: 0 %
Eosinophils: 3 %
Hct: 42 % (ref 34.0–45.0)
Hgb: 13.4 gm/dL (ref 11.2–15.7)
Lymphocytes: 19 %
MCH: 27.2 pg (ref 26.0–32.0)
MCHC: 31.9 g/dL — ABNORMAL LOW (ref 32.0–36.0)
MCV: 85.4 um3 (ref 79.0–95.0)
MPV: 10.3 fL (ref 9.4–12.4)
Monocytes: 11 %
Plt Count: 332 10*3/uL (ref 140–370)
RBC: 4.92 10*6/uL (ref 3.90–5.20)
RDW: 16.3 % — ABNORMAL HIGH (ref 12.0–14.0)
Segs: 67 %
WBC: 8.1 10*3/uL (ref 4.0–10.0)

## 2018-02-05 LAB — PROTHROMBIN TIME, BLOOD
INR: 1
PT,Patient: 10.9 s (ref 9.7–12.5)

## 2018-02-05 LAB — GLYCOSYLATED HGB(A1C), BLOOD: Glyco Hgb (A1C): 5.1 % (ref 4.8–5.8)

## 2018-02-05 LAB — APTT, BLOOD: PTT: 41 s — ABNORMAL HIGH (ref 25–34)

## 2018-03-31 ENCOUNTER — Other Ambulatory Visit (INDEPENDENT_AMBULATORY_CARE_PROVIDER_SITE_OTHER): Payer: Self-pay | Admitting: Physician Assistant

## 2018-03-31 DIAGNOSIS — G894 Chronic pain syndrome: Principal | ICD-10-CM

## 2018-04-02 ENCOUNTER — Emergency Department (HOSPITAL_BASED_OUTPATIENT_CLINIC_OR_DEPARTMENT_OTHER): Payer: Medicare Other

## 2018-04-02 ENCOUNTER — Emergency Department (EMERGENCY_DEPARTMENT_HOSPITAL): Payer: Medicare Other

## 2018-04-02 ENCOUNTER — Emergency Department
Admission: EM | Admit: 2018-04-02 | Discharge: 2018-04-03 | Disposition: A | Discharge status: Home / Self Care | Payer: Medicare Other | Attending: Emergency Medicine | Admitting: Emergency Medicine

## 2018-04-02 DIAGNOSIS — E785 Hyperlipidemia, unspecified: Secondary | ICD-10-CM | POA: Insufficient documentation

## 2018-04-02 DIAGNOSIS — F1120 Opioid dependence, uncomplicated: Secondary | ICD-10-CM | POA: Insufficient documentation

## 2018-04-02 DIAGNOSIS — I10 Essential (primary) hypertension: Secondary | ICD-10-CM | POA: Insufficient documentation

## 2018-04-02 DIAGNOSIS — Z6831 Body mass index (BMI) 31.0-31.9, adult: Secondary | ICD-10-CM | POA: Insufficient documentation

## 2018-04-02 DIAGNOSIS — R1013 Epigastric pain: Secondary | ICD-10-CM

## 2018-04-02 DIAGNOSIS — Z8719 Personal history of other diseases of the digestive system: Secondary | ICD-10-CM | POA: Insufficient documentation

## 2018-04-02 DIAGNOSIS — R197 Diarrhea, unspecified: Secondary | ICD-10-CM | POA: Insufficient documentation

## 2018-04-02 DIAGNOSIS — Z9884 Bariatric surgery status: Secondary | ICD-10-CM | POA: Insufficient documentation

## 2018-04-02 DIAGNOSIS — R109 Unspecified abdominal pain: Secondary | ICD-10-CM | POA: Insufficient documentation

## 2018-04-02 DIAGNOSIS — Z888 Allergy status to other drugs, medicaments and biological substances status: Secondary | ICD-10-CM | POA: Insufficient documentation

## 2018-04-02 DIAGNOSIS — F319 Bipolar disorder, unspecified: Secondary | ICD-10-CM | POA: Insufficient documentation

## 2018-04-02 DIAGNOSIS — F17200 Nicotine dependence, unspecified, uncomplicated: Secondary | ICD-10-CM | POA: Insufficient documentation

## 2018-04-02 DIAGNOSIS — F209 Schizophrenia, unspecified: Secondary | ICD-10-CM | POA: Insufficient documentation

## 2018-04-02 DIAGNOSIS — J984 Other disorders of lung: Secondary | ICD-10-CM

## 2018-04-02 DIAGNOSIS — E669 Obesity, unspecified: Secondary | ICD-10-CM | POA: Insufficient documentation

## 2018-04-02 DIAGNOSIS — Z79899 Other long term (current) drug therapy: Secondary | ICD-10-CM | POA: Insufficient documentation

## 2018-04-02 DIAGNOSIS — J9811 Atelectasis: Secondary | ICD-10-CM

## 2018-04-02 DIAGNOSIS — Z90710 Acquired absence of both cervix and uterus: Secondary | ICD-10-CM | POA: Insufficient documentation

## 2018-04-02 DIAGNOSIS — Z9889 Other specified postprocedural states: Secondary | ICD-10-CM | POA: Insufficient documentation

## 2018-04-02 DIAGNOSIS — J986 Disorders of diaphragm: Secondary | ICD-10-CM

## 2018-04-02 DIAGNOSIS — R110 Nausea: Secondary | ICD-10-CM | POA: Insufficient documentation

## 2018-04-02 DIAGNOSIS — Z885 Allergy status to narcotic agent status: Secondary | ICD-10-CM | POA: Insufficient documentation

## 2018-04-02 LAB — CBC WITH DIFF, BLOOD
ANC-Automated: 5 10*3/uL (ref 1.6–7.0)
Abs Basophils: 0 10*3/uL (ref <0.1)
Abs Eosinophils: 0.1 10*3/uL (ref 0.1–0.5)
Abs Lymphs: 2 10*3/uL (ref 0.8–3.1)
Abs Monos: 0.6 10*3/uL (ref 0.2–0.8)
Basophils: 0 %
Eosinophils: 2 %
Hct: 39 % (ref 34.0–45.0)
Hgb: 12.9 gm/dL (ref 11.2–15.7)
Lymphocytes: 26 %
MCH: 27.7 pg (ref 26.0–32.0)
MCHC: 33.1 g/dL (ref 32.0–36.0)
MCV: 83.7 um3 (ref 79.0–95.0)
MPV: 8.6 fL — ABNORMAL LOW (ref 9.4–12.4)
Monocytes: 8 %
Plt Count: 320 10*3/uL (ref 140–370)
RBC: 4.66 10*6/uL (ref 3.90–5.20)
RDW: 15.9 % — ABNORMAL HIGH (ref 12.0–14.0)
Segs: 65 %
WBC: 7.7 10*3/uL (ref 4.0–10.0)

## 2018-04-02 MED ORDER — HYDROMORPHONE HCL 1 MG/ML IJ SOLN
1.00 mg | Freq: Once | INTRAMUSCULAR | Status: AC
Start: 2018-04-02 — End: 2018-04-02
  Administered 2018-04-02 (×2): 1 mg via INTRAVENOUS
  Filled 2018-04-02: qty 1

## 2018-04-02 MED ORDER — LACTATED RINGERS IV SOLN
Freq: Once | INTRAVENOUS | Status: AC
Start: 2018-04-02 — End: 2018-04-03
  Administered 2018-04-02: via INTRAVENOUS

## 2018-04-02 MED ORDER — HYDROMORPHONE HCL 1 MG/ML IJ SOLN
0.50 mg | Freq: Once | INTRAMUSCULAR | Status: AC
Start: 2018-04-03 — End: 2018-04-03
  Administered 2018-04-03: 0.5 mg via INTRAVENOUS
  Filled 2018-04-02: qty 1

## 2018-04-02 MED ORDER — PROMETHAZINE HCL 25 MG/ML IJ SOLN
12.50 mg | Freq: Four times a day (QID) | INTRAMUSCULAR | Status: DC | PRN
Start: 2018-04-02 — End: 2018-04-03

## 2018-04-02 MED ORDER — HYDROMORPHONE HCL 1 MG/ML IJ SOLN
0.50 mg | INTRAMUSCULAR | Status: DC | PRN
Start: 2018-04-02 — End: 2018-04-03

## 2018-04-02 NOTE — ED Notes (Signed)
MD finch remains at bs attempting IV access via u/s

## 2018-04-02 NOTE — ED Notes (Signed)
04/02/2018 10:44 PM Alexandria Proctor    An EKG was handed to Dr. Leighton Rufforreia with prior

## 2018-04-02 NOTE — ED Notes (Signed)
MD finch at bs for IV access attempt via u/s

## 2018-04-02 NOTE — EMS Narrative (Addendum)
Pt Age: [age] Years; Gender: Female;  Primary Impression: Abdominal Pain/Problems;  Medical History: ; Pt Medication: Methocarbamol 750 MG Oral Tablet,   Hydrocodone-Acetaminophen 5-325 Mg Or Tabs, Amoxicillin-Pot Clavulanate   875-125 Mg Or Tabs, Promethazine Hydrochloride 25 MG Oral Tablet,   Amlodipine 2.5 MG Oral Tablet, pantoprazole 40 MG Enteric Coated Tablet,   Cyclobenzaprine Hcl 10 Mg Or Tabs, Bupropion Hcl 100 Mg Or Tabs, TOBRADEX   (TOBRAMYCIN/DEXAMETHASONE), 0.3 %-0.1%, OINT. (G), OPHTHALMIC, [facility]   3.5 g TUBE, Pregabalin 200 Mg Or Caps; Pt Allergies: Tramadol, Zofran,   Ketorolac, Metoclopramide, Ondansetron, Ketorolac Tromethamine, Reglan, ;  Gastric bypass 2007, associated bowel obstructions, and infections. Pt   states abdominal pain for two weeks progressively getting worse ,10/10   cramping pain upper left and right quadrant, non radiating. Pain increased   upon palpation. Rigidity.     Mental Status: Normal Baseline for Patient; Neuro: Normal Baseline for   Patient; Upper Quad Lt: Pain; Upper Quad Rt: Pain;     Pt oriented x3 no loc.     Base Called: [place]    CC:    HPI:  Primary Symptom: Nausea  Provider's Primary Impression: Generalized abdominal pain  Initial Patient Acuity: Lower Acuity Chilton Si(Green)    Alert:  Patient Care Report Number: [id number]  Incident Number: [id number]  EMS Vehicle (Unit) Number: [id number]  EMS Unit Call Sign: M1  Level of Care of This Unit: ALS-Paramedic  Incident Location Type: Unsp non-institutional (private) residence as place  Incident Street Address: [address]  [place] [phone]  Incident ZIP Code: [phone]    Assessment:  Heart Assessment: Normal  Mental Status Assessment: Normal Baseline for Patient    Procedure - Arrest:  Cardiac Arrest: No    Procedure - Exam:    Procedure - Injury:    Procedure - Airway:    Procedure - Medications:    Procedure - Generic:  Date/Time Procedure Performed: 2019-07-12T21:43:09-07:00  Procedure: [id number]  Date/Time Procedure  Performed: 2019-07-12T21:43:09-07:00  Procedure: [id number]    Demographics History:  Medication Allergies: 267036  Medication Allergies: 66981  Medication Allergies: 10689  Medication Allergies: 35827  Medication Allergies: 6915  Medication Allergies: 28200  Medication Allergies: 26225  Current Medications: [id number]  Current Medications: 857002  Current Medications: [id number]  Current Medications: 992447  Current Medications: [id number]  Current Medications: 314200  Current Medications: [id number]  Current Medications: [id number]  Current Medications: [id number]  Current Medications: 841660483446    Demographics Practitioner:    Demographics Patient:  Last Name: Alexandria Proctor  First Name: Alexandria Proctor  Patient's Home Address: [address]  Patient's Home City: [phone]  [place]: [phone]  Patient's Home State: 06  Patient's Home ZIP Code: [phone]  Patient's Country of Residence: [place]  Gender: Female  Race: Black or African American  Age: [age]  Age Units: Years    Demographics Times:  Unit Notified by Dispatch Date/Time: 2019-07-12T21:24:41-07:00  Unit En Route Date/Time: 2019-07-12T21:24:47-07:00  Unit Arrived on Scene Date/Time: 2019-07-12T21:32:26-07:00  Arrived at Patient Date/Time: 2019-07-12T21:34:57-07:00  Unit Left Scene Date/Time: 2019-07-12T21:44:08-07:00  Patient Arrived at Destination Date/Time: 2019-07-12T22:01:07-07:00    Demographics Payment:

## 2018-04-03 ENCOUNTER — Other Ambulatory Visit: Payer: Self-pay

## 2018-04-03 ENCOUNTER — Emergency Department (EMERGENCY_DEPARTMENT_HOSPITAL): Payer: Medicare Other

## 2018-04-03 DIAGNOSIS — Z9884 Bariatric surgery status: Secondary | ICD-10-CM

## 2018-04-03 DIAGNOSIS — R109 Unspecified abdominal pain: Secondary | ICD-10-CM

## 2018-04-03 LAB — COMPREHENSIVE METABOLIC PANEL, BLOOD
ALT (SGPT): 23 U/L (ref 0–33)
AST (SGOT): 16 U/L (ref 0–32)
Albumin: 4.1 g/dL (ref 3.5–5.2)
Alkaline Phos: 91 U/L (ref 35–140)
Anion Gap: 9 mmol/L (ref 7–15)
BUN: 21 mg/dL (ref 8–23)
Bicarbonate: 28 mmol/L (ref 22–29)
Bilirubin, Tot: 0.26 mg/dL (ref <1.2)
Calcium: 9 mg/dL (ref 8.5–10.6)
Chloride: 102 mmol/L (ref 98–107)
Creatinine: 1.09 mg/dL — ABNORMAL HIGH (ref 0.51–0.95)
GFR: >60 mL/min
Glucose: 89 mg/dL (ref 70–99)
Potassium: 4.2 mmol/L (ref 3.5–5.1)
Sodium: 139 mmol/L (ref 136–145)
Total Protein: 6.5 g/dL (ref 6.0–8.0)

## 2018-04-03 LAB — ECG 12-LEAD
ATRIAL RATE: 52 {beats}/min
P AXIS: 72 degrees
PR INTERVAL: 170 ms
QRS INTERVAL/DURATION: 100 ms
QT: 432 ms
QTC INTERVAL: 401 ms
R AXIS: -4 degrees
T AXIS: 30 degrees
VENTRICULAR RATE: 52 {beats}/min

## 2018-04-03 LAB — URINALYSIS WITH CULTURE REFLEX, WHEN INDICATED
Bilirubin: NEGATIVE
Glucose: NEGATIVE
Ketones: NEGATIVE
Leuk Esterase: NEGATIVE
Nitrite: NEGATIVE
Protein: NEGATIVE
Specific Gravity: 1.024 (ref 1.002–1.030)
Urobilinogen: NEGATIVE
pH: 6 (ref 5.0–8.0)

## 2018-04-03 LAB — LACTATE, BLOOD: Lactate: 1 mmol/L (ref 0.5–2.0)

## 2018-04-03 LAB — LIPASE, BLOOD: Lipase: 15 U/L (ref 13–60)

## 2018-04-03 MED ORDER — IOHEXOL 350 MG/ML IV SOLN
100.00 mL | Freq: Once | INTRAVENOUS | Status: AC
Start: 2018-04-03 — End: 2018-04-03
  Administered 2018-04-03 (×2): 100 mL via INTRAVENOUS
  Filled 2018-04-03: qty 100

## 2018-04-03 MED ORDER — LIDOCAINE VISCOUS 2 % MT SOLN
10.00 mL | Freq: Once | OROMUCOSAL | Status: AC
Start: 2018-04-03 — End: 2018-04-03
  Administered 2018-04-03: 10 mL via ORAL
  Filled 2018-04-03: qty 15

## 2018-04-03 MED ORDER — ALUM & MAG HYDROXIDE-SIMETH 200-200-20 MG/5ML OR SUSP
30.00 mL | Freq: Once | ORAL | Status: AC
Start: 2018-04-03 — End: 2018-04-03
  Administered 2018-04-03 (×2): 30 mL via ORAL
  Filled 2018-04-03: qty 30

## 2018-04-03 MED ORDER — SODIUM CHLORIDE 0.9 % IV SOLN
12.50 mg | Freq: Four times a day (QID) | INTRAVENOUS | Status: DC | PRN
Start: 2018-04-03 — End: 2018-04-03
  Administered 2018-04-03: 12.5 mg via INTRAVENOUS
  Filled 2018-04-03: qty 0.5

## 2018-04-03 MED ORDER — HYDROMORPHONE HCL 1 MG/ML IJ SOLN
1.00 mg | INTRAMUSCULAR | Status: DC | PRN
Start: 2018-04-03 — End: 2018-04-03
  Administered 2018-04-03: 1 mg via INTRAVENOUS
  Filled 2018-04-03: qty 1

## 2018-04-03 NOTE — ED Notes (Signed)
Pt en route to CT in care of tech

## 2018-04-03 NOTE — Consults (Signed)
Minimally Invasive SURGERY CONSULT      Reason for consult:  Abdominal pain     History of Present Illness:     Alexandria Proctor is a 62 year old female with history of schizophrenia/Bipolar 1, RYGB in [place] in 2003 c/b peri-op SBO with re-operation and multiple vental hernia repairs, marginal ulcer (2013) and chronic abdominal pain (since at least 2008) with multiple ED visits for similar complaints dating back years (most recent here 01/05/2018).  She presents today reporting 2 weeks of worsening abdominal pain, which finally became so bad the last few days that she sought care in the emergency room.  She came in today because she could no longer sleep because of the pain.  She also endorses a few days of PO intolerance and 2 weeks of non-bloody diarrhea.  Denies fevers/chills/SOB    Past Medical and Surgical History:  Past Medical History:   Diagnosis Date    Amphetamine abuse (CMS-HCC)     Bipolar 1 disorder (CMS-HCC)     Bipolar affective (CMS-HCC)     Fracture, intertrochanteric, left femur (CMS-HCC)     Gastric bypass status for obesity     H/O: hysterectomy     HLD (hyperlipidemia)     HTN (hypertension)     Manic depression (CMS-HCC)     Obesity     Opioid abuse (CMS-HCC)     Schizophrenia (CMS-HCC)      Past Surgical History:   Procedure Laterality Date    GASTRIC BYPASS      hx gastric bypass      s/p IMN left IT fracture 07/20/16      STOMACH SURGERY      Ventral Hernia repair         Allergies:  Allergies   Allergen Reactions    Compazine Anaphylaxis     Per patient tolerates promethazine/Phenergan    Compazine Rash    Haldol [Haloperidol] Swelling    Ondansetron Swelling    Reglan [Kdc:Yellow Dye+Ci Pigment Blue 63+Metoclopramide] Swelling    Reglan [Metoclopramide] Anaphylaxis    Toradol Anaphylaxis    Toradol [Ketorolac Tromethamine] Swelling    Tramadol Anaphylaxis     Per patient, tolerates hydromorphone (Dilaudid) and morphine     Ultram [Tramadol] Swelling    Tolerates Dilaudid    Zofran [Fd&C Yellow #6 [place]-Ondansetron] Swelling    Zofran [Ondansetron] Anaphylaxis    Reglan [Metoclopramide] Unspecified    Toradol Other     "my throat closes up and tongue swells up"    Tramadol Unspecified     Per patient, tolerates dilaudid and perocet    Tramadol Unspecified    Zofran [Ondansetron] Unspecified       Medications:  Current Facility-Administered Medications   Medication    HYDROmorphone (DILAUDID) injection 1 mg    promethazine (PHENERGAN) 12.5 mg in sodium chloride 0.9 % 50 mL IVPB     Current Outpatient Medications   Medication Sig    acetaminophen (TYLENOL) 325 MG tablet Take 2 tablets (650 mg) by mouth every 4 hours as needed for Mild Pain (Pain Score 1-3).    amLODIPINE (NORVASC) 2.5 MG tablet Take 1 tablet (2.5 mg) by mouth every morning (with breakfast).    buPROPion (WELLBUTRIN) 100 MG tablet Take 100 mg by mouth 3 times daily.    carisoprodol (SOMA) 350 MG tablet Take 350 mg by mouth every 6 hours.    cyclobenzaprine (FLEXERIL) 10 MG tablet 10 mg by Oral route.    diphenhydrAMINE (  BENADRYL) 50 MG/ML injection Inject 0.5 mLs (25 mg) into vein every 6 hours as needed (itching).    docusate sodium (COLACE) 250 MG capsule Take 1 capsule (250 mg) by mouth nightly.    docusate sodium (COLACE) 250 MG capsule Take 1 capsule by mouth daily.    HYDROcodone-acetaminophen (NORCO) 5-325 MG tablet     hydrocortisone 2.5 % ointment Apply 1 Application topically 2 times daily. Use a small amount as directed    lansoprazole (PREVACID) 30 MG capsule Take 1 capsule (30 mg) by mouth 2 times daily (before meals).    lansoprazole (PREVACID) 30 MG capsule Take 1 capsule (30 mg) by mouth every morning (before breakfast).    methadone (DOLOPHINE) 10 MG tablet Take 30 mg by mouth daily.    methocarbamol (ROBAXIN) 500 MG tablet     methocarbamol (ROBAXIN) 750 MG tablet Take 1 tablet (750 mg) by mouth 3 times daily.    nalOXone (NARCAN) 0.4 MG/ML injection Inject  0.25 mLs (0.1 mg) into vein every 2 minutes as needed (somnolence).    pantoprazole (PROTONIX) 20 MG tablet Take 20 mg by mouth daily.    pregabalin (LYRICA) 200 MG capsule Take 1 capsule (200 mg) by mouth 3 times daily.    senna (SENOKOT) 8.6 MG tablet Take 2 tablets (17.2 mg) by mouth every morning.    senna (SENOKOT) 8.6 MG tablet Take 1 tablet by mouth daily as needed for Constipation.    sodium chloride 0.9 % solution Inject 10 mL/hr into vein continuous.    tobramycin-dexamethasone (TOBRADEX) ophthalmic ointment 3 (three) times a day.    tobramycin-dexamethasone (TOBRADEX) ophthalmic suspension        Social History:  Social History     Socioeconomic History    Marital status: Single     Spouse name: Not on file    Number of children: Not on file    Years of education: Not on file    Highest education level: Not on file   Occupational History    Occupation: unemployed   Tobacco Use    Smoking status: Current Every Day Smoker   Substance and Sexual Activity    Alcohol use: Yes     Alcohol/week: 0.0 oz    Drug use: Yes     Types: Methamphetamines, Marijuana    Sexual activity: Not Currently   Social Activities of Daily Living Present    Not on file   Social History Narrative    ** Merged History Encounter **         ** Merged History Encounter **         ** Merged History Encounter **            Family History:  Family History   Problem Relation Name Age of Onset    No Known Problems Mother         Review of Systems:  12 point ROS reviewed and negative except as above    Physical Exam:  BP 123/81    Pulse 54    Temp 97.6 F (36.4 C)    Resp 16    Ht '5\' 6"'  (1.676 m)    Wt 87.1 kg (192 lb)    SpO2 99%    BMI 30.99 kg/m   General Appearance: alert, curled in fetal position and rocking in bed moaning  Eyes:  conjunctivae and corneas clearetric, normal size.  Heart:  normal rate and regular rhythm  Lungs: no chest deformities noted.  Abdomen: Abdomen soft,  non-distended.  On even the lightest  touch of her peri-umbilical area patient screams and jumps, however this response is distractible, and she has no tenderness in the rest of the abdomen, no rebound or guarding.    Extremities:  no cyanosis, clubbing, or edema.      Labs and Other Data:  Lab Results   Component Value Date    NA 139 04/02/2018    K 4.2 04/02/2018    CL 102 04/02/2018    BICARB 28 04/02/2018    BUN 21 04/02/2018    CREAT 1.09 (H) 04/02/2018    GLU 89 04/02/2018     9.0 04/02/2018     Lab Results   Component Value Date    WBC 7.7 04/02/2018    HGB 12.9 04/02/2018    HCT 39.0 04/02/2018    PLT 320 04/02/2018    SEG 65 04/02/2018    LYMPHS 26 04/02/2018    MONOS 8 04/02/2018    EOS 2 04/02/2018     Lab Results   Component Value Date    AST 16 04/02/2018    ALT 23 04/02/2018    ALK 91 04/02/2018    TBILI 0.26 04/02/2018    TP 6.5 04/02/2018    ALB 4.1 04/02/2018     No results found for: INR, PTT  No results found for: PHUA, SGUA, [place], KETONEUA, BLOODUA, PROTEINUA, LEUKESTUA, NITRITEUA, WBCUA, RBCUA, EPITHCELLSUA, CRYSTALSUA, COMMENTSUA    Imaging:  CT A/P:  Rad pre: Stable abdomen/pelvis CT compared to 08/23/2013. No acute change. Postsurgical changes of gastrojejunostomy. Extensive fatty infiltration and thickening of the gastric wall is unchanged. Hepatomegaly. Status post cholecystectomy with moderate extrahepatic biliary ductal dilation and mild intrahepatic biliary ductal dilation, unchanged. Atherosclerotic calcifications. Midline anterior abdominal wall surgical scar with adhesion of small bowel loops. No evidence of small-bowel obstruction. Status post surgical fixation of the left femoral head with an intramedullary rod and interlocking screw.     Assessment and Recommendations:  62 year old female with complex surgical history including RYGB 2003 with immediate SBO and multiple interventions in the remote past for ventral hernias, as well as a complex psychiatric history and long history of abdominal pain of unclear  etiology as well as history of opioid dependence and malingering.  Labs are reassuring, CT scan is entirely unchanged from 2014 with no acute pathology, and exam is not consistent with an intra-abdominal process.  No evidence of internal hernia or obstructive symptoms.   .It appears patient has already had recent (12/2017) GI referral for repeat endoscopy to rule out other causes of her abdominal pain, however she has missed her appointments  - No surgical intervention indicated  - Ok for discharge to follow up with PCP and GI   - Continue [place]  Pager: [phone]

## 2018-04-03 NOTE — ED Notes (Signed)
Ok to continue prn dilaudid IV administration, per MD farrah

## 2018-04-03 NOTE — ED Floor Report (Signed)
ED to IP Handoff    Report created by Fredia SorrowLeah Takyra Cantrall, RN at 1:23 AM 04/03/2018.     HANDOFF REPORT UPDATE/CHANGES (changes in patient status/care/events prior to transfer)  By who:  Time:   Additional information:                                                                                                                                                     Alexandria FallsHelen V Proctor is a 62 year old female.    Brief Summary of ED Visit (to include focused assessment and neuro status):    62yo F BIBA for R mid/upper abd pain that radiates to navel x2wks, worse last night.  Pt pain unrelieved by Q1 IV dilaudid administration r/t previous drug abuse.  Pt hx gastric bypass (with multiple subsequent infections), SBO, manic depression, bipolar, schizophrenia.  LOCx4, afebrile, SB per monitor in 50s, RA, ambulatory at home.      RN shift assessment exceptions to WDL: see above    Any significant events and interventions with responses:  See above    Radiologic studies not completed: n/a  (None unless otherwise noted)    Chief Complaint   Patient presents with    Abdominal Pain     From home for abd pain for 2 weeks, worse tonight, 10/10 upper right and left quadrants. History of SOB, infections and gastric bypass in 2007. +N/V/D today. Same thing 2 months ago, didn't get treatment at that time.       Admitted for: tbd    Code Status:  Please refer to In-pt admitting doctors orders     Level of Care: tbd     Is patient septic? no If yes, complete below:    BC x 2 drawn? yes  If No explain:       Repeat lactate needed? no  If Yes, when is it due?       All initial antibiotics given?  no  If No, explain:      Amount of IV fluids received 1000 ml    Is patient on Heparin? no If yes, complete below:     Time Heparin bolus was given:      Additional drips patient is on:      Cardiac rhythm: sb    Oxygen Delivery: None    Past Medical History:   Diagnosis Date    Amphetamine abuse (CMS-HCC)     Bipolar 1 disorder (CMS-HCC)     Bipolar  affective (CMS-HCC)     Fracture, intertrochanteric, left femur (CMS-HCC)     Gastric bypass status for obesity     H/O: hysterectomy     HLD (hyperlipidemia)     HTN (hypertension)     Manic depression (CMS-HCC)     Obesity     Opioid abuse (CMS-HCC)  Schizophrenia (CMS-HCC)        Past Surgical History:   Procedure Laterality Date    GASTRIC BYPASS      hx gastric bypass      s/p IMN left IT fracture 07/20/16      STOMACH SURGERY      Ventral Hernia repair         Allergies: Compazine; Compazine; Haldol [haloperidol]; Ondansetron; Reglan [kdc:yellow dye+ci pigment blue 63+metoclopramide]; Reglan [metoclopramide]; Toradol; Toradol [ketorolac tromethamine]; Tramadol; Ultram [tramadol]; Zofran [fd&c yellow #6 [place]-ondansetron]; Zofran [ondansetron]; Reglan [metoclopramide]; Toradol; Tramadol; Tramadol; and Zofran [ondansetron]    ED Fall Risk: No    Skin issues:  no    >> If yes, note areas of skin breakdown. See appropriate photos.      Ambulatory:  yes    Sitter needed: no    Suicide Risk:  no    Isolation Required: no     >> If yes , what type of isolation:      Is patient in custody?  no    Is patient in restraints? no    Vitals:    04/02/18 2223 04/02/18 2300 04/03/18 0000   BP: 129/80 (!) 120/106 123/81   BP Location:  Right arm    BP Patient Position:  Semi-Fowlers    Pulse: 53 52 54   Resp: 20 17 16    Temp: 97.6 F (36.4 C)     SpO2: 98% 100% 99%   Weight: 87.1 kg (192 lb)     Height: 5\' 6"  (1.676 m)              Lab Results   Component Value Date    WBC 7.7 04/02/2018    RBC 4.66 04/02/2018    HGB 12.9 04/02/2018    HCT 39.0 04/02/2018    MCV 83.7 04/02/2018    MCHC 33.1 04/02/2018    RDW 15.9 (H) 04/02/2018    PLT 320 04/02/2018    MPV 8.6 (L) 04/02/2018       Lab Results   Component Value Date    NA 139 04/02/2018    K 4.2 04/02/2018    CL 102 04/02/2018    BICARB 28 04/02/2018    BUN 21 04/02/2018    CREAT 1.09 (H) 04/02/2018    GLU 89 04/02/2018    [place] 9.0 04/02/2018       Lab Results    Component Value Date    LACTATE 1.0 04/03/2018       No results found for: CPK, CKMBH, TROPONIN    No results found for: PH, PCO2, O2CONTENT, IVHC3, IVBE, O2SAT, UNPH, UNPCO2, ARTPH, ARTPCO2, ARTO2CNT, IAHC3, IABE, ARTO2SAT, UNAPH, UNAPCO2    No results found for this visit on 04/02/18.      Patient Lines/Drains/Airways Status    Active PICC Line / CVC Line / PIV Line / Drain / Airway / Intraosseous Line / Epidural Line / ART Line / Line Type / Wound     Name: Placement date: Placement time: Site: Days:    Peripheral IV - 18 G Left Arm  04/02/18   2339   Arm  less than 1                    ED Handoff Report is ready for review.  Admitting RN may reach Emergency Department RN, Fredia Sorrow, RN, at ED with any questions.

## 2018-04-03 NOTE — ED Notes (Signed)
Pt returned from CT with no changes to previously charted pt condition

## 2018-04-03 NOTE — ED Notes (Signed)
RN observed pt HR observed in low 40s.  Pt denies dizziness, HA, SOB, cp, and is completely asymptomatic.  Previously pt was 50s bpm.  MD farrah informed.  No new RN orders at this time.

## 2018-04-03 NOTE — ED MD Progress Note (Signed)
Sign out from Dr[facility] Layla [facility]awSubramony at [facility]2AM  21M p/w abdominal pain and nausea and diarrhea s/p gastric bypass - pending CT. Surgery already consulted.   Will CTM.    Update 4AM  HDS, pain better controlled.  CT with no acute findings, surgery reviewed it and OK with DC.   Strict return precautions discussed. Patient endorses understanding of the care plan.

## 2018-04-03 NOTE — ED Notes (Signed)
Surgery MD bedside with pt.

## 2018-04-03 NOTE — ED Provider Notes (Signed)
Emergency Department Note  [facility] electronic medical record reviewed for pertinent medical history.     Nursing Triage Note:   Chief Complaint   Patient presents with    Abdominal Pain     From home for abd pain for 2 weeks, worse tonight, 10/10 upper right and left quadrants. History of SOB, infections and gastric bypass in 2007. +N/V/D today. Same thing 2 months ago, didn't get treatment at that time.       HPI:   62 year old female with a PMH significant for Roux-en-y gastric bypass surgery in 2019 complicated by small bowel obstruction shortly after, bipolar 1, schizophrenia, hyperlipidemia, hypertension, obesity presents with periumbilical and bilateral lower abdominal pain. She mentions her pain started 2 weeks ago, got worse 3 days ago in the mentions she came in today after she could not sleep last night.  This had nausea without vomiting, also endorses nonbloody non mucoid diarrhea for 2 weeks.  Unable to tolerate p.o. denies fevers, chills, sweats.  Denies hematuria, dysuria, vaginal bleeding or discharge. No recent travel.  No recent antibiotics.  Mentions in addition to small bowel obstructions she has history of internal hernias after her gastric bypass surgery.  Was recently seen here for similar type pain, no acute illness found at that time.    HPI    Past Medical History:   Diagnosis Date    Amphetamine abuse (CMS-HCC)     Bipolar 1 disorder (CMS-HCC)     Bipolar affective (CMS-HCC)     Fracture, intertrochanteric, left femur (CMS-HCC)     Gastric bypass status for obesity     H/O: hysterectomy     HLD (hyperlipidemia)     HTN (hypertension)     Manic depression (CMS-HCC)     Obesity     Opioid abuse (CMS-HCC)     Schizophrenia (CMS-HCC)        Past Surgical History:   Procedure Laterality Date    GASTRIC BYPASS      hx gastric bypass      s/p IMN left IT fracture 07/20/16      STOMACH SURGERY      Ventral Hernia repair         Family History:  No pertinent family hx     Family  History   Problem Relation Name Age of Onset    No Known Problems Mother         Social History:  +tob, -EtOH, -illicit substances    Social History     Tobacco Use    Smoking status: Current Every Day Smoker   Substance Use Topics    Alcohol use: Yes     Alcohol/week: 0.0 oz    Drug use: Yes     Types: Methamphetamines, Marijuana       Medications:   Prior to Admission Medications   Prescriptions Last Dose Informant Patient Reported? Taking?   HYDROcodone-acetaminophen (NORCO) 5-325 MG tablet   Yes No   acetaminophen (TYLENOL) 325 MG tablet   No No   Sig: Take 2 tablets (650 mg) by mouth every 4 hours as needed for Mild Pain (Pain Score 1-3).   amLODIPINE (NORVASC) 2.5 MG tablet   No No   Sig: Take 1 tablet (2.5 mg) by mouth every morning (with breakfast).   buPROPion (WELLBUTRIN) 100 MG tablet   Yes No   Sig: Take 100 mg by mouth 3 times daily.   carisoprodol (SOMA) 350 MG tablet   Yes No  Sig: Take 350 mg by mouth every 6 hours.   cyclobenzaprine (FLEXERIL) 10 MG tablet   Yes No   Sig: 10 mg by Oral route.   diphenhydrAMINE (BENADRYL) 50 MG/ML injection   No No   Sig: Inject 0.5 mLs (25 mg) into vein every 6 hours as needed (itching).   docusate sodium (COLACE) 250 MG capsule   No No   Sig: Take 1 capsule by mouth daily.   docusate sodium (COLACE) 250 MG capsule   No No   Sig: Take 1 capsule (250 mg) by mouth nightly.   hydrocortisone 2.5 % ointment   No No   Sig: Apply 1 Application topically 2 times daily. Use a small amount as directed   lansoprazole (PREVACID) 30 MG capsule   No No   Sig: Take 1 capsule (30 mg) by mouth every morning (before breakfast).   lansoprazole (PREVACID) 30 MG capsule   No No   Sig: Take 1 capsule (30 mg) by mouth 2 times daily (before meals).   methadone (DOLOPHINE) 10 MG tablet   Yes No   Sig: Take 30 mg by mouth daily.   methocarbamol (ROBAXIN) 500 MG tablet   Yes No   methocarbamol (ROBAXIN) 750 MG tablet   No No   Sig: Take 1 tablet (750 mg) by mouth 3 times daily.      nalOXone (NARCAN) 0.4 MG/ML injection   No No   Sig: Inject 0.25 mLs (0.1 mg) into vein every 2 minutes as needed (somnolence).   pantoprazole (PROTONIX) 20 MG tablet   Yes No   Sig: Take 20 mg by mouth daily.   pregabalin (LYRICA) 200 MG capsule   No No   Sig: Take 1 capsule (200 mg) by mouth 3 times daily.   senna (SENOKOT) 8.6 MG tablet   No No   Sig: Take 1 tablet by mouth daily as needed for Constipation.   senna (SENOKOT) 8.6 MG tablet   No No   Sig: Take 2 tablets (17.2 mg) by mouth every morning.   sodium chloride 0.9 % solution   No No   Sig: Inject 10 mL/hr into vein continuous.   tobramycin-dexamethasone (TOBRADEX) ophthalmic ointment   Yes No   Sig: 3 (three) times a day.   tobramycin-dexamethasone (TOBRADEX) ophthalmic suspension   Yes No      Facility-Administered Medications: None       Allergies: Compazine; Compazine; Haldol [haloperidol]; Ondansetron; Reglan [kdc:yellow dye+ci pigment blue 63+metoclopramide]; Reglan [metoclopramide]; Toradol; Toradol [ketorolac tromethamine]; Tramadol; Ultram [tramadol]; Zofran [fd&c yellow #6 [place]-ondansetron]; Zofran [ondansetron]; Reglan [metoclopramide]; Toradol; Tramadol; Tramadol; and Zofran [ondansetron]      Review of Systems  All other systems reviewed and negative unless otherwise noted in the HPI or above. This was done per my custom and practice for systems appropriate to the chief complaint in an emergency department setting and varies depending on the quality of history that the patient is able to provide.      Nursing note and vitals reviewed.     Physical Exam       Physical Exam:   04/03/18  0000 04/03/18  0200 04/03/18  0400 04/03/18  0615   BP: 123/81 115/67 116/58 122/85   Pulse: 54 (!) 46 (!) 48 56   Resp: 16 17 15 17    Temp:       SpO2: 99% 96% 96% 96%     Nursing note and vitals reviewed. Pt not hypoxic.  General Appearance: Obese,  well nourished, appears to be in no acute distress.  CV: RRR, normal S1/S2, no m/r/g. Capillary refill is <  2 seconds. No carotid bruits.  Lung: CTAB with no w/r/r  Abdomen: Significant lower abdominal ttp, otherwise soft, NTND, +BS, no guarding/rigidity, no masses or organomegaly  Back: No CVA tenderness  Extremities: No edema, deformity, or calf tenderness, FROM, DPs 2+ bilaterally.   Skin: WDI, no pallor, normal texture and turgor with no lesions  Psychiatric: Patient demonstrates good judgement, normal affect and behavior, no delusions or hallucinations.     Abnormal Labs Reviewed   URINALYSIS WITH CULTURE REFLEX, WHEN INDICATED - Abnormal; Notable for the following components:       Result Value    Blood 1+ (*)     All other components within normal limits   COMPREHENSIVE METABOLIC PANEL, BLOOD - Abnormal; Notable for the following components:    Creatinine 1.09 (*)     All other components within normal limits   CBC WITH DIFF, BLOOD - Abnormal; Notable for the following components:    RDW 15.9 (*)     MPV 8.6 (*)     All other components within normal limits     CT Abdomen And Pelvis With Contrast    (Results Pending)   X-Ray Chest Frontal And Lateral    (Results Pending)         Impression & Initial ED Plan:  62 year old  female presents with 2 weeks of periumbilical and lower abdominal pain, worse the past 3 days.  She has a past medical history significant for gastric bypass surgery in 2007 subsequently had small bowel obstruction.  Also endorses history of internal hernias.  Endorses nausea but no vomiting.  Has had 2 weeks of nonbloody non mucoid diarrhea.  Denies other constitutional symptoms.  Denies chest pain or shortness of breath.  Denies urinary changes.  No recent antibiotics. Physical exam demonstrates a female who appears her stated age and is aaox3.Cardiac exam with rrr no murmurs, lungs ctab, abd w/ significant superficial ttp otherwise soft ntnd no masses, cap refill < 2 secs, dps 2+ bilaterally, skin wdi.  No vital sign abnormalities concerning for sepsis.  Labs unremarkable.  No acute changes on  CT abdomen and pelvis with contrast.  Chest x-ray unremarkable.  General surgery consulted as patient is followed by the bariatric service, they are on concerned with her presentation given her physical exam and imaging findings.  Patient's pain controlled in the emergency department.  She tolerated p.o. return precautions provided to the patient, she voiced her understanding.  Instructed patient to follow up with the primary physician as well as her bariatric surgeon, she also voiced understanding to this.          The rest of the ED course, results, and plan for the patient is in a separate continuation note. Please see that note for details.       I have discussed my evaluation and care plan for the patient with the attending physician Dr. Layla Maw.     Darryll Capers, MD  Resident  04/03/18 1610       Bary Richard, MD  04/08/18 [phone]

## 2018-04-03 NOTE — Discharge Instructions (Signed)
Please return if he has any fevers, chills, sweats, vomiting, dominant pain, changes in bowel habits, urinary changes, bloody stools.  Please return for any concerning may have.  Please make an appointment with the primary physician to discuss this episode of pain. Please also contact your surgeon regarding this episode as well.  Thank you for letting us provide care.

## 2018-04-03 NOTE — ED Notes (Signed)
Pt rousable but sleeping soundly on gurney in NAD.

## 2018-04-06 ENCOUNTER — Emergency Department (HOSPITAL_BASED_OUTPATIENT_CLINIC_OR_DEPARTMENT_OTHER): Payer: Medicare Other

## 2018-04-06 ENCOUNTER — Emergency Department
Admission: EM | Admit: 2018-04-06 | Discharge: 2018-04-07 | Disposition: A | Discharge status: Home / Self Care | Payer: Medicare Other | Attending: Emergency Medicine | Admitting: Emergency Medicine

## 2018-04-06 ENCOUNTER — Emergency Department (EMERGENCY_DEPARTMENT_HOSPITAL): Payer: Medicare Other

## 2018-04-06 DIAGNOSIS — Z993 Dependence on wheelchair: Secondary | ICD-10-CM | POA: Insufficient documentation

## 2018-04-06 DIAGNOSIS — E785 Hyperlipidemia, unspecified: Secondary | ICD-10-CM | POA: Insufficient documentation

## 2018-04-06 DIAGNOSIS — Z888 Allergy status to other drugs, medicaments and biological substances status: Secondary | ICD-10-CM | POA: Insufficient documentation

## 2018-04-06 DIAGNOSIS — R079 Chest pain, unspecified: Secondary | ICD-10-CM

## 2018-04-06 DIAGNOSIS — Z90710 Acquired absence of both cervix and uterus: Secondary | ICD-10-CM | POA: Insufficient documentation

## 2018-04-06 DIAGNOSIS — E876 Hypokalemia: Secondary | ICD-10-CM | POA: Insufficient documentation

## 2018-04-06 DIAGNOSIS — F319 Bipolar disorder, unspecified: Secondary | ICD-10-CM | POA: Insufficient documentation

## 2018-04-06 DIAGNOSIS — Z8719 Personal history of other diseases of the digestive system: Secondary | ICD-10-CM | POA: Insufficient documentation

## 2018-04-06 DIAGNOSIS — Z9884 Bariatric surgery status: Secondary | ICD-10-CM | POA: Insufficient documentation

## 2018-04-06 DIAGNOSIS — R531 Weakness: Secondary | ICD-10-CM

## 2018-04-06 DIAGNOSIS — Z6830 Body mass index (BMI) 30.0-30.9, adult: Secondary | ICD-10-CM | POA: Insufficient documentation

## 2018-04-06 DIAGNOSIS — G8929 Other chronic pain: Secondary | ICD-10-CM | POA: Insufficient documentation

## 2018-04-06 DIAGNOSIS — Z79899 Other long term (current) drug therapy: Secondary | ICD-10-CM | POA: Insufficient documentation

## 2018-04-06 DIAGNOSIS — E669 Obesity, unspecified: Secondary | ICD-10-CM | POA: Insufficient documentation

## 2018-04-06 DIAGNOSIS — R0602 Shortness of breath: Secondary | ICD-10-CM | POA: Insufficient documentation

## 2018-04-06 DIAGNOSIS — F209 Schizophrenia, unspecified: Secondary | ICD-10-CM | POA: Insufficient documentation

## 2018-04-06 DIAGNOSIS — R0789 Other chest pain: Secondary | ICD-10-CM | POA: Insufficient documentation

## 2018-04-06 DIAGNOSIS — I714 Abdominal aortic aneurysm, without rupture, unspecified (CMS-HCC): Secondary | ICD-10-CM

## 2018-04-06 DIAGNOSIS — F17200 Nicotine dependence, unspecified, uncomplicated: Secondary | ICD-10-CM | POA: Insufficient documentation

## 2018-04-06 DIAGNOSIS — F11959 Opioid use, unspecified with opioid-induced psychotic disorder, unspecified: Secondary | ICD-10-CM | POA: Insufficient documentation

## 2018-04-06 DIAGNOSIS — I10 Essential (primary) hypertension: Secondary | ICD-10-CM | POA: Insufficient documentation

## 2018-04-06 DIAGNOSIS — R109 Unspecified abdominal pain: Secondary | ICD-10-CM | POA: Insufficient documentation

## 2018-04-06 DIAGNOSIS — F1210 Cannabis abuse, uncomplicated: Secondary | ICD-10-CM | POA: Insufficient documentation

## 2018-04-06 LAB — CBC WITH DIFF, BLOOD
ANC-Automated: 7.6 10*3/uL — ABNORMAL HIGH (ref 1.6–7.0)
Abs Basophils: 0 10*3/uL (ref <0.1)
Abs Eosinophils: 0.2 10*3/uL (ref 0.1–0.5)
Abs Lymphs: 2 10*3/uL (ref 0.8–3.1)
Abs Monos: 0.9 10*3/uL — ABNORMAL HIGH (ref 0.2–0.8)
Basophils: 0 %
Eosinophils: 2 %
Hct: 40.5 % (ref 34.0–45.0)
Hgb: 13.4 gm/dL (ref 11.2–15.7)
Imm Gran Abs: 0.1 10*3/uL (ref <0.1)
Lymphocytes: 19 %
MCH: 27.9 pg (ref 26.0–32.0)
MCHC: 33.1 g/dL (ref 32.0–36.0)
MCV: 84.4 um3 (ref 79.0–95.0)
MPV: 8.5 fL — ABNORMAL LOW (ref 9.4–12.4)
Monocytes: 8 %
Plt Count: 365 10*3/uL (ref 140–370)
RBC: 4.8 10*6/uL (ref 3.90–5.20)
RDW: 15.9 % — ABNORMAL HIGH (ref 12.0–14.0)
Segs: 71 %
WBC: 10.8 10*3/uL — ABNORMAL HIGH (ref 4.0–10.0)

## 2018-04-06 LAB — PROTHROMBIN TIME, BLOOD
INR: 1
PT,Patient: 11.5 s (ref 9.7–12.5)

## 2018-04-06 LAB — BASIC METABOLIC PANEL, BLOOD
Anion Gap: 13 mmol/L (ref 7–15)
BUN: 6 mg/dL — ABNORMAL LOW (ref 8–23)
Bicarbonate: 25 mmol/L (ref 22–29)
Calcium: 9.5 mg/dL (ref 8.5–10.6)
Chloride: 104 mmol/L (ref 98–107)
Creatinine: 0.81 mg/dL (ref 0.51–0.95)
GFR: >60 mL/min
Glucose: 83 mg/dL (ref 70–99)
Potassium: 2.9 mmol/L — ABNORMAL LOW (ref 3.5–5.1)
Sodium: 142 mmol/L (ref 136–145)

## 2018-04-06 LAB — MAGNESIUM, BLOOD: Magnesium: 2.1 mg/dL (ref 1.6–2.4)

## 2018-04-06 LAB — TROPONIN T GEN 5 W/REFLEX TO CK/CKMB
Troponin T Gen 5 w/Reflex CK/CKMB: <6 ng/L (ref <14)
Troponin T Gen 5 w/Reflex CK/CKMB: <6 ng/L (ref <14)

## 2018-04-06 LAB — PHOSPHORUS, BLOOD: Phosphorous: 3.1 mg/dL (ref 2.7–4.5)

## 2018-04-06 LAB — GLUCOSE (POCT): Glucose (POCT): 161 mg/dL — ABNORMAL HIGH (ref 70–99)

## 2018-04-06 MED ORDER — POTASSIUM CHLORIDE CRYS CR 20 MEQ OR TBCR
40.00 meq | EXTENDED_RELEASE_TABLET | Freq: Once | ORAL | Status: DC
Start: 2018-04-06 — End: 2018-04-07
  Filled 2018-04-06: qty 2

## 2018-04-06 MED ORDER — IPRATROPIUM-ALBUTEROL 0.5-2.5 (3) MG/3ML IN SOLN
3.00 mL | Freq: Once | RESPIRATORY_TRACT | Status: AC
Start: 2018-04-06 — End: 2018-04-06
  Administered 2018-04-06: 3 mL via RESPIRATORY_TRACT
  Filled 2018-04-06: qty 1

## 2018-04-06 MED ORDER — LIDOCAINE HCL 1 % IJ SOLN
10.00 mL | Freq: Once | INTRAMUSCULAR | Status: DC
Start: 2018-04-06 — End: 2018-04-07

## 2018-04-06 MED ORDER — IOHEXOL 350 MG/ML IV SOLN
100.00 mL | Freq: Once | INTRAVENOUS | Status: AC
Start: 2018-04-06 — End: 2018-04-06
  Administered 2018-04-06: 100 mL via INTRAVENOUS
  Filled 2018-04-06: qty 100

## 2018-04-06 NOTE — ED Notes (Signed)
Pt back to rm 7b for IV placement  Per triage - the pt stood up and got into the gurney on her own without assistance. She was also seen in the waiting room "stand up and sit in a chair and then go through her belongings."

## 2018-04-06 NOTE — ED Notes (Signed)
Pt back from CT

## 2018-04-06 NOTE — ED Notes (Signed)
ecg to MD Rudie MeyerKurtz along with prior

## 2018-04-06 NOTE — ED Notes (Signed)
Triage note: PIV attempt x2 without success, per pt normally requires U/S guided IVs
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
## 2018-04-06 NOTE — ED Notes (Signed)
Pt transported to CT.  Okay to transport off monitor, per Freddie Breecharlile MD

## 2018-04-06 NOTE — ED Notes (Signed)
Pt transported to CT on all monitors with ed resident and neurology

## 2018-04-06 NOTE — ED Notes (Signed)
Pt resting quietly, resp even/unlabored. In nad

## 2018-04-06 NOTE — ED Notes (Signed)
Stroke code cancelled per neuro resident

## 2018-04-06 NOTE — ED Notes (Signed)
STROKE CODE ACTIVATED [phone]

## 2018-04-06 NOTE — Consults (Signed)
[facility]roke Resuscitation Note  Demographics:  April 06, 2018  Patient Name: Alexandria Proctor  Medical Record #: [id number]  DOB: 06/23/1956  Age: 62 year old  Sex: female    Location: 07/07B               DOA: 04/06/2018     Evaluation Requested by:  Ova Freshwater, MD    Chief Complaint:  Stroke Code      Stroke Timeline (From Doc Flowsheet Entry):   Last Known Well Date: 04/06/18 (04/06/18 2000 : Lorne Skeens, MD)  Last Known Well Time: 0600 (04/06/18 2000 : Lorne Skeens, MD)  Time Stroke Code Called: 2013  Time Head CT Performed: 2025  Time Head CT Read: 2030  Time of Decision: 2030  Stroke Diagnosis: TIA / Diagnosis Other Than Stroke / Stroke Mimic  Treatment Decision: Excluded (Reasons Below)  Reasons for TPA Exclusion: Over 4.5 hours from symptom onset (ECASS-3 analagous cohort patient)      History of Present Illness:     Alexandria Proctor is an 62 year old woman with schizophrenia, obesity, hypertension presenting with chest pain and right sided weakness. She reports electric-like chest pain radiating down to her abdomen and flank, as well as acute right arm weakness starting at about 9am when she dropped her coffee cup in the morning. She was last well when she awoke at [place]. She says she has never had anything like this before. She denies double vision, vision change, neck pain, fever, trouble speaking. Initial NIHSS 8 as she was eyes closed but would arouse easily, says she is "62" and month "February" for orientation, both arms drift down towards bed, both arms appear incoordinated on finger to nose, and numbness on the right side. CT head non-contrast with chronic appearing hypodensity in right occipital white matter, otherwise no acute hemorrhage, dense vessel, mass, or edema. Unable to obtain angiogram initially as no IV access, later obtained but poor quality as timed for concurrent chest angio, within limits of exam no large vessel occlusion.     Of note, patient noted to use both  arms deftly to go through her bag in triage, and was able to get up from her wheelchair (uses power wheelchair she says for back problem) and walk to gurney independently.     Review of Systems: A complete review of systems was performed and is negative except as noted in HPI       PMH:  Past Medical History:   Diagnosis Date    Amphetamine abuse (CMS-HCC)     Bipolar 1 disorder (CMS-HCC)     Bipolar affective (CMS-HCC)     Fracture, intertrochanteric, left femur (CMS-HCC)     Gastric bypass status for obesity     H/O: hysterectomy     HLD (hyperlipidemia)     HTN (hypertension)     Manic depression (CMS-HCC)     Obesity     Opioid abuse (CMS-HCC)     Schizophrenia (CMS-HCC)        Medications:  Current Facility-Administered Medications   Medication    ipratropium-albuterol (DUO-NEB) 0.5-3 MG/3ML nebulizer solution 3 mL    lidocaine 1% injection 10 mL     Current Outpatient Medications   Medication Sig    acetaminophen (TYLENOL) 325 MG tablet Take 2 tablets (650 mg) by mouth every 4 hours as needed for Mild Pain (Pain Score 1-3).    amLODIPINE (NORVASC) 2.5 MG tablet Take 1 tablet (2.5 mg) by  mouth every morning (with breakfast).    buPROPion (WELLBUTRIN) 100 MG tablet Take 100 mg by mouth 3 times daily.    carisoprodol (SOMA) 350 MG tablet Take 350 mg by mouth every 6 hours.    cyclobenzaprine (FLEXERIL) 10 MG tablet 10 mg by Oral route.    diphenhydrAMINE (BENADRYL) 50 MG/ML injection Inject 0.5 mLs (25 mg) into vein every 6 hours as needed (itching).    docusate sodium (COLACE) 250 MG capsule Take 1 capsule (250 mg) by mouth nightly.    docusate sodium (COLACE) 250 MG capsule Take 1 capsule by mouth daily.    HYDROcodone-acetaminophen (NORCO) 5-325 MG tablet     hydrocortisone 2.5 % ointment Apply 1 Application topically 2 times daily. Use a small amount as directed    lansoprazole (PREVACID) 30 MG capsule Take 1 capsule (30 mg) by mouth 2 times daily (before meals).    lansoprazole  (PREVACID) 30 MG capsule Take 1 capsule (30 mg) by mouth every morning (before breakfast).    methadone (DOLOPHINE) 10 MG tablet Take 30 mg by mouth daily.    methocarbamol (ROBAXIN) 500 MG tablet     methocarbamol (ROBAXIN) 750 MG tablet Take 1 tablet (750 mg) by mouth 3 times daily.    nalOXone (NARCAN) 0.4 MG/ML injection Inject 0.25 mLs (0.1 mg) into vein every 2 minutes as needed (somnolence).    pantoprazole (PROTONIX) 20 MG tablet Take 20 mg by mouth daily.    pregabalin (LYRICA) 200 MG capsule Take 1 capsule (200 mg) by mouth 3 times daily.    senna (SENOKOT) 8.6 MG tablet Take 2 tablets (17.2 mg) by mouth every morning.    senna (SENOKOT) 8.6 MG tablet Take 1 tablet by mouth daily as needed for Constipation.    sodium chloride 0.9 % solution Inject 10 mL/hr into vein continuous.    tobramycin-dexamethasone (TOBRADEX) ophthalmic ointment 3 (three) times a day.    tobramycin-dexamethasone (TOBRADEX) ophthalmic suspension        Allergies:   Allergies   Allergen Reactions    Compazine Anaphylaxis     Per patient tolerates promethazine/Phenergan    Compazine Rash    Haldol [Haloperidol] Swelling    Ondansetron Swelling    Reglan [Kdc:Yellow Dye+Ci Pigment Blue 63+Metoclopramide] Swelling    Reglan [Metoclopramide] Anaphylaxis    Toradol Anaphylaxis    Toradol [Ketorolac Tromethamine] Swelling    Tramadol Anaphylaxis     Per patient, tolerates hydromorphone (Dilaudid) and morphine     Ultram [Tramadol] Swelling     Tolerates Dilaudid    Zofran [Fd&C Yellow #6 [place]-Ondansetron] Swelling    Zofran [Ondansetron] Anaphylaxis    Reglan [Metoclopramide] Unspecified    Toradol Other     "my throat closes up and tongue swells up"    Tramadol Unspecified     Per patient, tolerates dilaudid and perocet    Tramadol Unspecified    Zofran [Ondansetron] Unspecified       PSH:  Past Surgical History:   Procedure Laterality Date    GASTRIC BYPASS      hx gastric bypass      s/p IMN left IT  fracture 07/20/16      STOMACH SURGERY      Ventral Hernia repair         Family History   Problem Relation Name Age of Onset    No Known Problems Mother         Social Hx:  Social History     Socioeconomic History  Marital status: Single     Spouse name: Not on file    Number of children: Not on file    Years of education: Not on file    Highest education level: Not on file   Occupational History    Occupation: unemployed   Tobacco Use    Smoking status: Current Every Day Smoker   Substance and Sexual Activity    Alcohol use: Yes     Alcohol/week: 0.0 oz    Drug use: Yes     Types: Methamphetamines, Marijuana    Sexual activity: Not Currently   Social Activities of Daily Living Present    Not on file   Social History Narrative    ** Merged History Encounter **         ** Merged History Encounter **         ** Merged History Encounter **            Physical Exam:   On Exam today, I find:   Vital signs: Recorded in chart, and reviewed by me today.  Vitals:    04/06/18 1719 04/06/18 2038   BP: 129/82 118/82   Pulse: 71 60   Resp: 16 18   SpO2: 100% 99%   Weight: 86.2 kg (190 lb)    Height: _0  (1.676 m)        General Exam:  General: middle aged woman in moderate distress from pain, cries intermittently  Eyes: no conjunctival injection  Throat: oropharynx clear, no ulcers, no exudates, moist mucus membranes  Neck: supple, no meningismus  CV: RRR, no murmur  Pulm: CTAB  Extremities: warm, no clubbing, cyanosis, or edema.      Neurological Exam:  Mental Status: eyes closed but arouses easily, follows all commands, oriented to person but not age, says it is February, knows she is in hospital. Normal language. No neglect. Cries intermittently when asked to perform a task that she has trouble with.     Cranial Nerves:  II: PERRL, visual fields intact to confrontation  III, IV, VI: EOMI intact, no nystagmus  VII: face symmetric, no nasolabial flattening, no delay in smile excursion  VIII: hearing intact to  conversational voice, no nystagmus  IX, X: palate elevation symmetric, no hoarseness of voice  XII: tongue protrusion is midline    Motor: Normal bulk and tone.  No tremors or dyskinesias.  Both arms can be maintained antigravity for 10 seconds, but she will drift down suddenly, appears like she tires out or poor effort. Maintains both legs antigravity for 5 seconds, with great exertion. Will not participate in more formal strength testing as she appears fatigued and begins to cry.     Coordination: Misses target slightly with both hands on finger nose finger  Sensory: diminished to light touch on right arm and leg  Reflexes: 2+ and symmetric, flexor plantar response        NIHSS Score (First):  LOC Code:  Not alert, but arousable by minor stimulation to obey, answer, or respond : LOC Code: 1   Item Q1b. LOCQ (X/2):  Answers neither question correctly : Item Q1b. LOCQ (X/2): 2   Item Q1c. LOCC (X/2):  Performs both tasks correctly : Item Q1c. LOCC (X/2): 0   Item Q2. Best Gaze (X/2):  Normal : Item Q2. Best Gaze (X/2): 0   Item Q3. Visual Fields (X/3):  No visual loss : Item Q3. Visual Fields (X/3): 0   Item Q4. Facial Palsy (X/3):  Normal symmetrical movement : Item Q4. Facial Palsy (X/3): 0   Left Arm Code:  Drift, limb holds 90 (or 45) degrees but drifts down before full 10 seconds, does not hit bed or other support : Left Arm Code: 1   Right Arm Code:  Drift, limb holds 90 (or 45) degrees but drifts down before full 10 seconds, does not hit bed or other support : Right Arm Code: 1   Left Leg Code:  No drift, leg holds 30 degrees for full 5 seconds : Left Leg Code: 0   Right Leg Code:  No drift, leg holds 30 degrees for full 5 seconds : Right Leg Code: 0   Item Q7. Limb Ataxia (X/2):  Present in two limbs : Item Q7. Limb Ataxia (X/2): 2   Item Q8. Sensory Loss (X/2):  Mild to moderate sensory loss, patient feels pin prick is less sharp or is dull on the affected side, or there is a loss of superficial pain with  pinprick but patient is aware he/she is being touched : Item Q8. Sensory Loss (X/2): 1   Item Q9. Aphasia (X/3):  No aphasia : Item Q9. Aphasia (X/3): 0   Item Q10. Dysarthria (X/2):  Normal : Item Q10. Dysarthria (X/2): 0   Item Q11. Extinction / Neglect (X/2):  No abnormailty : Item Q11. Extinction / Neglect (X/2): 0   NIHSS Total Score: 8 (04/06/18 2000)    NIHSS Score (Most Recent):  LOC Code:  Not alert, but arousable by minor stimulation to obey, answer, or respond : LOC Code: 1   Item Q1b. LOCQ (X/2):  Answers neither question correctly : Item Q1b. LOCQ (X/2): 2   Item Q1c. LOCC (X/2):  Performs both tasks correctly : Item Q1c. LOCC (X/2): 0   Item Q2. Best Gaze (X/2):  Normal : Item Q2. Best Gaze (X/2): 0   Item Q3. Visual Fields (X/3):  No visual loss : Item Q3. Visual Fields (X/3): 0   Item Q4. Facial Palsy (X/3):  Normal symmetrical movement : Item Q4. Facial Palsy (X/3): 0   Left Arm Code:  Drift, limb holds 90 (or 45) degrees but drifts down before full 10 seconds, does not hit bed or other support : Left Arm Code: 1   Right Arm Code:  Drift, limb holds 90 (or 45) degrees but drifts down before full 10 seconds, does not hit bed or other support : Right Arm Code: 1   Left Leg Code:  No drift, leg holds 30 degrees for full 5 seconds : Left Leg Code: 0   Right Leg Code:  No drift, leg holds 30 degrees for full 5 seconds : Right Leg Code: 0   Item Q7. Limb Ataxia (X/2):  Present in two limbs : Item Q7. Limb Ataxia (X/2): 2   Item Q8. Sensory Loss (X/2):  Mild to moderate sensory loss, patient feels pin prick is less sharp or is dull on the affected side, or there is a loss of superficial pain with pinprick but patient is aware he/she is being touched : Item Q8. Sensory Loss (X/2): 1   Item Q9. Aphasia (X/3):  No aphasia : Item Q9. Aphasia (X/3): 0   Item Q10. Dysarthria (X/2):  Normal : Item Q10. Dysarthria (X/2): 0   Item Q11. Extinction / Neglect (X/2):  No abnormailty : Item Q11. Extinction / Neglect  (X/2): 0   NIHSS Total Score: 8 (04/06/18 2000)      Laboratory data:  All  labs reviewed in EPIC and are notable for:  CBC  Lab Results   Component Value Date    WBC 10.8 (H) 04/06/2018    WBC 7.7 04/02/2018    WBC 8.1 02/05/2018    HGB 13.4 04/06/2018    PLT 365 04/06/2018    PLT 348 03/18/2007    SEG 71 04/06/2018    LYMPHS 19 04/06/2018    MONOS 8 04/06/2018    EOS 2 04/06/2018    BASOS 0 04/06/2018       CHEMISTRY  Lab Results   Component Value Date    NA 139 04/02/2018    K 4.2 04/02/2018    CL 102 04/02/2018    BICARB 28 04/02/2018    BUN 21 04/02/2018    CREAT 1.09 (H) 04/02/2018    GLU 89 04/02/2018     9.0 04/02/2018       LFTS  Lab Results   Component Value Date    TBILI 0.26 04/02/2018    AST 16 04/02/2018    ALT 23 04/02/2018    ALK 91 04/02/2018    TP 6.5 04/02/2018    ALB 4.1 04/02/2018         Radiology: Images viewed by me and are notable for:  CT head non-contrast with chronic appearing hypodensity in right occipital white matter, otherwise no acute hemorrhage, dense vessel, mass, or edema.  CTA headn/neck - poor quality as timed for concurrent chest angio, within limits of exam no large vessel occlusion.       Assessment/ Plan:   Alexandria Proctor is an 61 year old woman with schizophrenia, obesity, hypertension presenting with chest pain and right sided weakness. She reports electric-like chest pain radiating down to her abdomen and flank, as well as acute right arm weakness starting at about 9am when she dropped her coffee cup in the morning. Her exam shows generalized effort dependent weakness, though she complains specifically about the right side. CT head without acute finding, and CTA head/neck poor quality as timed for concurrent chest angio, within limits of exam no large vessel occlusion. Overall given lack of focal signs and effort dependent debility, low suspicion for stroke. No further stroke workup indicated unless there is new suspicion by primary team.     Decision to (or not to)  administer IV tPA was made after careful discussion of patients clinical and imaging findings, including a discussion detailing the absence of intracerebral hemorrhage, with the stroke attending.  Patient was not a candidate for IV rtPA:  Due to: Reasons for TPA Exclusion: Over 4.5 hours from symptom onset (ECASS-3 analagous cohort patient)  Patient was not a candidate for endovascular therapy:  Due to no large vessel occlusion      Discussed with Dr Hermelinda Dellen, MD MSc  [facility]

## 2018-04-06 NOTE — ED Notes (Signed)
Pt occasionally appears very lethargic when previously easily talking/interacting with staff. Per pt, c/o RUE weakness since "I don't remember. I dropped by coffee cup this morning." pt noted to be able to use her RUE but during neuro exam she is unable to hold her arm up.  Pt also stating that she can normally transition herself to her wheelchair but today "I had to crawl to it."

## 2018-04-06 NOTE — ED Notes (Signed)
Attempt to give my PO KCL unsuccessful. Pt continually falling asleep during conversation and unable to safely drink water. PO meds held.   She continues to fall asleep mid sentence. Wakes up with stimulation but falls back asleep. PERRL 2mm bilaterally, no facial droop. MD to bedside for re-eval.

## 2018-04-07 ENCOUNTER — Other Ambulatory Visit: Payer: Self-pay

## 2018-04-07 ENCOUNTER — Emergency Department (EMERGENCY_DEPARTMENT_HOSPITAL): Payer: Medicare Other

## 2018-04-07 DIAGNOSIS — R29898 Other symptoms and signs involving the musculoskeletal system: Secondary | ICD-10-CM

## 2018-04-07 LAB — URINALYSIS WITH CULTURE REFLEX, WHEN INDICATED
Bilirubin: NEGATIVE
Blood: NEGATIVE
Glucose: NEGATIVE
Ketones: NEGATIVE
Leuk Esterase: NEGATIVE
Nitrite: NEGATIVE
Protein: NEGATIVE
Specific Gravity: 1.03 (ref 1.002–1.030)
Urobilinogen: NEGATIVE
pH: 7 (ref 5.0–8.0)

## 2018-04-07 LAB — ECG 12-LEAD
ATRIAL RATE: 75 {beats}/min
ECG INTERPRETATION: NORMAL
P AXIS: 67 degrees
PR INTERVAL: 158 ms
QRS INTERVAL/DURATION: 100 ms
QT: 396 ms
QTC INTERVAL: 442 ms
R AXIS: -16 degrees
T AXIS: 63 degrees
VENTRICULAR RATE: 75 {beats}/min

## 2018-04-07 LAB — GLUCOSE (POCT): Glucose (POCT): 149 mg/dL — ABNORMAL HIGH (ref 70–99)

## 2018-04-07 MED ORDER — LORAZEPAM 2 MG/ML IJ SOLN
1.00 mg | Freq: Once | INTRAMUSCULAR | Status: AC
Start: 2018-04-07 — End: 2018-04-07
  Administered 2018-04-07 (×2): 1 mg via INTRAVENOUS
  Filled 2018-04-07: qty 1

## 2018-04-07 MED ORDER — POTASSIUM CHLORIDE 10 MEQ/100ML IV SOLN
10.0000 meq | INTRAVENOUS | Status: AC
Start: 2018-04-07 — End: 2018-04-07
  Administered 2018-04-07 (×3): 10 meq via INTRAVENOUS
  Filled 2018-04-07 (×3): qty 100

## 2018-04-07 MED ORDER — SODIUM CHLORIDE 0.9 % IV SOLN
12.50 mg | Freq: Once | INTRAVENOUS | Status: AC
Start: 2018-04-07 — End: 2018-04-07
  Administered 2018-04-07: 12.5 mg via INTRAVENOUS
  Filled 2018-04-07: qty 0.5

## 2018-04-07 MED ORDER — ALUM & MAG HYDROXIDE-SIMETH 200-200-20 MG/5ML OR SUSP
30.00 mL | Freq: Once | ORAL | Status: AC
Start: 2018-04-07 — End: 2018-04-07
  Administered 2018-04-07 (×2): 30 mL via ORAL
  Filled 2018-04-07: qty 30

## 2018-04-07 MED ORDER — LIDOCAINE VISCOUS 2 % MT SOLN
10.00 mL | Freq: Once | OROMUCOSAL | Status: AC
Start: 2018-04-07 — End: 2018-04-07
  Administered 2018-04-07: 10 mL via ORAL
  Filled 2018-04-07: qty 15

## 2018-04-07 MED ORDER — HYDROCODONE-ACETAMINOPHEN 5-325 MG OR TABS
1.00 | ORAL_TABLET | Freq: Once | ORAL | Status: AC
Start: 2018-04-07 — End: 2018-04-07
  Administered 2018-04-07: 1 via ORAL
  Filled 2018-04-07: qty 1

## 2018-04-07 NOTE — ED Notes (Signed)
Patient was offered sandwich, juice, and string cheese at this time.

## 2018-04-07 NOTE — ED MD Progress Note (Signed)
Signout from - Dr. Freddie Breech    History - Alexandria Proctor is a 62 year old female w/ meth use, bipolar, HLD, HTN, schizophrenia wheelchair dependent at baseline, p/w sob, got up out of triage chair, started complaining of weakness in L UE, difficulty speaking. Pt was seen ambulating just prior to this. Code stroke negative CT head neg, CTA poor quality 2/2 incomplete timing, no obvious major stroke, called of code stroke. CHest pain workup negative CTPE, atypical chest pain, EKG non-ischemic, troponin neg. Hypokalemia---> getting IV 40 replacement. Pt somnolent but appears may be volitional. Getting MRI given these constellation of symptoms. Has a home.     Vitals:  Vitals:    04/06/18 2038 04/06/18 2107 04/06/18 2129 04/06/18 2229   BP: 118/82 127/82  125/87   BP Location:  Right arm  Right arm   BP Patient Position:  Semi-Fowlers  Semi-Fowlers   Pulse: 60 69  (!) 49   Resp: 18 17  16    Temp:    97.8 F (36.6 C)   SpO2: 99% 98% 98% 100%   Weight:       Height:           Lab Results:  Labs Reviewed   BASIC METABOLIC PANEL, BLOOD - Abnormal; Notable for the following components:       Result Value    BUN 6 (*)     Potassium 2.9 (*)     All other components within normal limits    Narrative:     RECD 2 EXTRA SST   CBC WITH DIFF, BLOOD - Abnormal; Notable for the following components:    WBC 10.8 (*)     RDW 15.9 (*)     MPV 8.5 (*)     ANC-Automated 7.6 (*)     Abs Monos 0.9 (*)     All other components within normal limits    Narrative:     RECD 2 EXTRA SST   GLUCOSE (POCT) - Abnormal; Notable for the following components:    Glucose (POCT) 161 (*)     All other components within normal limits   TROPONIN T GEN 5 W/REFLEX TO CK/CKMB   PROTHROMBIN TIME, BLOOD    Narrative:     RECD 2 EXTRA SST   MAGNESIUM, BLOOD    Narrative:     RECD 2 EXTRA SST   PHOSPHORUS, BLOOD    Narrative:     RECD 2 EXTRA SST   TROPONIN T GEN 5 W/REFLEX TO CK/CKMB    Narrative:     RECD 2 EXTRA SST   CBC WITH DIFF, BLOOD   TROPONIN T GEN 5  W/REFLEX TO CK/CKMB   TROPONIN T GEN 5 W/REFLEX TO CK/CKMB   BASIC METABOLIC PANEL, BLOOD   URINALYSIS WITH CULTURE REFLEX, WHEN INDICATED       Imaging Results:  CTA Pulmonary Embolus   Final Result   IMPRESSION:   No evidence of acute pulmonary thromboembolic disease. No evidence of acute cardiopulmonary disease.         CTA Abdomen And Pelvis   Final Result   IMPRESSION:   CT scan of the abdomen and pelvis with IV contrast      No acute intra-abdominal or intrapelvic abnormality. No evidence of aortic dissection.      Abdominal aortic aneurysm measuring up to 3.1 cm above the level of the renal arteries, similar to 2014.            X-Ray Chest Frontal  And Lateral   Final Result   IMPRESSION:   No evidence of acute cardiopulmonary disease.      Low lung volumes.      No significant change from prior exam.         CT STROKE Angiography Head and Neck    (Results Pending)   MRI Brain WO/W Contrast    (Results Pending)       Dispo -   Pending MRI   SNF vs d/c home pending reassessment       1:35 AM  Pt reassessed by primary team, updated signout: pt alert, feeling improved. Getting phenergan for her "chronic abdominal pain." Still planning for MRI and UA    3:47 AM  MRI is done.     5:43 AM  Normal brain MRI per [facility] read.   Pt reassessed, she is seen lifting her L arm above her head,good grip squeeze. Pt again reports she does not want snf. D/c home, arm weakness appears to have resolved.     Kris HartmannJessica Brice, [place]PGY4  Emergency Medicine

## 2018-04-07 NOTE — ED MD Progress Note (Signed)
Chief Complaint   Patient presents with    Shortness of Breath     intermittent sob starting this am.  no h/o asthma/copd.  no edema         S/O: Brice  Extensive medical w/u overnight, all negative and documented  Pt still here only bc waiting for someone to come get her at 8AM.     To do/pending items:  DC at 8AM or SW      Emergency Department Course at time of sign-out   Orders:   Orders Placed This Encounter   Procedures    X-Ray Chest Frontal And Lateral    CT STROKE Angiography Head and Neck    CTA Pulmonary Embolus    CTA Abdomen And Pelvis    MRI Brain W/O Contrast    CBC w/ Diff Lavender    Troponin T Gen 5 w/Reflex to CK/CKMB Green Plasma Separator Tube    Troponin T Gen 5 w/Reflex to CK/CKMB Green Plasma Separator Tube    Troponin T Gen 5 w/Reflex to CK/CKMB Green Plasma Separator Tube    Basic Metabolic Panel, Blood Green Plasma Separator Tube    Prothrombin Time, Blood Blue    Basic Metabolic Panel, Blood Green Plasma Separator Tube    CBC w/ Diff Lavender    Magnesium, Blood Green Plasma Separator Tube    Phosphorus, Blood Green Plasma Separator Tube    GLUCOSE (POCT)    Troponin T Gen 5 w/Reflex to CK/CKMB    Urinalysis with Culture Reflex, when indicated    IP Consult to Neurology Stroke    ECG 12 Lead     Medications   lidocaine 1% injection 10 mL (10 mL IntraDERMAL Not given 04/06/18 2040)   potassium chloride (KLOR-CON M20) ER tablet 40 mEq (0 mEq Oral Hold 04/06/18 2310)   potassium chloride 10 MEQ/100ML IVPB 10 mEq (0 mEq IntraVENOUS Stopped 04/07/18 0559)   ipratropium-albuterol (DUO-NEB) 0.5-3 MG/3ML nebulizer solution 3 mL (3 mL Nebulization Given 04/06/18 2145)   iohexol (OMNIPAQUE 350) 350 MG/ML solution 100 mL (100 mL IntraVENOUS Given 04/06/18 2220)   promethazine (PHENERGAN) 12.5 mg in sodium chloride 0.9 % 50 mL IVPB (0 mg IntraVENOUS Completed 04/07/18 0450)   LORazepam (ATIVAN) injection 1 mg (1 mg IntraVENOUS Given 04/07/18 0215)   aluminum-magnesium-simethicone  (MAG-AL PLUS) 200-200-20 MG/5ML suspension 30 mL (30 mL Oral Given 04/07/18 0450)   lidocaine (XYLOCAINE) 2 % viscous solution 10 mL (10 mL Oral Given 04/07/18 0450)     Labs Reviewed   BASIC METABOLIC PANEL, BLOOD - Abnormal; Notable for the following components:       Result Value    BUN 6 (*)     Potassium 2.9 (*)     All other components within normal limits    Narrative:     RECD 2 EXTRA SST   CBC WITH DIFF, BLOOD - Abnormal; Notable for the following components:    WBC 10.8 (*)     RDW 15.9 (*)     MPV 8.5 (*)     ANC-Automated 7.6 (*)     Abs Monos 0.9 (*)     All other components within normal limits    Narrative:     RECD 2 EXTRA SST   GLUCOSE (POCT) - Abnormal; Notable for the following components:    Glucose (POCT) 161 (*)     All other components within normal limits   TROPONIN T GEN 5 W/REFLEX TO CK/CKMB   PROTHROMBIN TIME, BLOOD  Narrative:     RECD 2 EXTRA SST   MAGNESIUM, BLOOD    Narrative:     RECD 2 EXTRA SST   PHOSPHORUS, BLOOD    Narrative:     RECD 2 EXTRA SST   TROPONIN T GEN 5 W/REFLEX TO CK/CKMB    Narrative:     RECD 2 EXTRA SST   URINALYSIS WITH CULTURE REFLEX, WHEN INDICATED   CBC WITH DIFF, BLOOD   TROPONIN T GEN 5 W/REFLEX TO CK/CKMB   TROPONIN T GEN 5 W/REFLEX TO CK/CKMB   BASIC METABOLIC PANEL, BLOOD     CTA Pulmonary Embolus   Final Result   IMPRESSION:   No evidence of acute pulmonary thromboembolic disease. No evidence of acute cardiopulmonary disease.         CTA Abdomen And Pelvis   Final Result   IMPRESSION:   CT scan of the abdomen and pelvis with IV contrast      No acute intra-abdominal or intrapelvic abnormality. No evidence of aortic dissection.      Abdominal aortic aneurysm measuring up to 3.1 cm above the level of the renal arteries, similar to 2014.            X-Ray Chest Frontal And Lateral   Final Result   IMPRESSION:   No evidence of acute cardiopulmonary disease.      Low lung volumes.      No significant change from prior exam.         CT STROKE Angiography Head  and Neck    (Results Pending)   MRI Brain W/O Contrast    (Results Pending)       Workup Review as of Apr 07 610   Others' Documentation   Tue Apr 06, 2018   2208 IMPRESSION:  No evidence of acute pulmonary thromboembolic disease. No evidence of acute cardiopulmonary disease.     CTA Pulmonary Embolus [MC]      Workup Review User Index  [MC] Lolita Lenzarlile, Morgan Adam, MD         Dispo:   DC      Jonny RuizJohn Ree Kida(Jack) R. Cathi RoanA. Garey Alleva, MD, JD  PGY-3, Emergency Medicine

## 2018-04-07 NOTE — ED Notes (Signed)
Have attempted to call pts transport  Hung up on x 1 and waited on hold x 15 min with second call and no answer

## 2018-04-07 NOTE — ED Notes (Signed)
pts d/c and f/u reviewed w pt who voiced understanding  Pt aware of vascular surgery consult  Discussed diet and s/s of low K  Pt is alert and oriented  [facility] and completing ADL's independently

## 2018-04-07 NOTE — ED Notes (Signed)
Transport present  Pt outside

## 2018-04-07 NOTE — ED Notes (Addendum)
Assumed care of pt  [facility]  Stroke code completed and cleared  Pt currently awaiting transport home w [facility] 0800    Pt sleeping, nad, equal rise and fall of the chest

## 2018-04-07 NOTE — ED Notes (Signed)
Pt transported to MRI on gurney. Awake and much more alert

## 2018-04-07 NOTE — ED MD Progress Note (Signed)
Workup Review as of Apr 07 1105   Others' Documentation   Tue Apr 06, 2018   2208 IMPRESSION:  No evidence of acute pulmonary thromboembolic disease. No evidence of acute cardiopulmonary disease.     CTA Pulmonary Embolus [MC]      Workup Review User Index  [MC] Lolita Lenzarlile, Morgan Adam, MD     Patient rolled into doc room  States she has been vomiting and unable to tol PO, abd pain  She had neg Ct scan of abdomen 7/13, and 7/16  Neg troponins  No anion gap  No ketones in urine 7/13 and 7/17  Has ben documented to have eaten, no vomiting documented

## 2018-04-07 NOTE — Discharge Instructions (Signed)
Hypokalemia    During your visit here today, your potassium was found to be too low.    Potassium is an "electrolyte." It is found in your cells and in your blood stream and is very important for your body's normal functioning.    Potassium can be low for many reasons. The most common causes are:   Taking diuretics (water pills) like furosemide (Lasix) and bumetanide (Bumex).   A lot of vomiting and/or diarrhea.   Poor nutrition.    Some symptoms when potassium is too low are fatigue (feeling tired), weakness and muscle cramping. If the potassium level gets dangerously low, abnormal electrical rhythms can develop in the heart.    YOU SHOULD SEEK MEDICAL ATTENTION IMMEDIATELY, EITHER HERE OR AT THE NEAREST EMERGENCY DEPARTMENT, IF ANY OF THE FOLLOWING OCCURS:   You feel weak or fatigued (tired) despite taking the potassium pills (liquid) prescribed.   You keep vomiting and are unable to keep the extra potassium down.                 Abdominal Aortic Aneurysm, Incidental  Referral to vascular surgery has been placed    You have an Abdominal Aortic Aneurysm (AAA).     The aneurysm was found using an ultrasound, CT scan, or another test during your visit.     An aneurysm is a bulge on a blood vessel. It is similar to a balloon or a weak spot on an old tire. Your aneurysm is on your aorta. The aorta is the largest blood vessel in the body. It starts at the heart, goes through the chest and the diaphragm, and then enters the belly. At the belly, it splits into two smaller arteries that go to the legs. The aorta brings blood to all of your organs in your abdomen (belly) and to your pelvis and legs. Aneurysms can develop at any point along the aorta, but they are most common in the abdomen.    Aneurysms are most common in people over age 47. About 5% of men (5 out of 100) over age 41 have an aneurysm. Men are five times more likely than women to develop aneurysms.    Aneurysms happen for a few  different reasons:   Atherosclerosis, also called "hardening of the arteries," causes about 80% of aneurysms (8 out of 10). It causes weak spots that make blood vessels bulge.   High blood pressure that is not well-controlled.   Smoking.   Rare genetic diseases (like Marfans Syndrome or Ehlers-Danlos Syndrome) that make the blood vessels weak.    When an aneurysm bursts, it causes sudden severe pain and internal bleeding. A ruptured aneurysm is a life-threatening condition, often causing death within minutes.    Your treatment will depend on the size of the aneurysm and how fast it is growing. If your aneurysm is large, fast-growing, or painful, you will likely need surgery. The surgery usually involves replacing the damaged portion of the blood vessel with a man-made graft, or artificial blood vessel. In some people, a small tube made of fabric supported by a metal mesh can be put into the aorta inside of the aneurysm. This is called an endovascular stent. It is used to reinforce the weak spot in the artery. The stent is put in through an artery in the groin and passed upward into the aneurysm in the belly. The procedure which is best for you will be decided when you see the vascular surgeon.    It is  OK for you to go home today, but it is EXTREMELY IMPORTANT to see your doctor. Your doctor will recheck the aneurysm. You will probably need a CT or ultrasound every six months.     Follow up with your doctor as soon as possible to discuss further treatment.    YOU SHOULD SEEK MEDICAL ATTENTION IMMEDIATELY, EITHER HERE OR AT THE NEAREST EMERGENCY DEPARTMENT, IF ANY OF THE FOLLOWING OCCURS:   You have severe pain in your belly, back or chest.   You have shortness of breath.   You have any fainting spells.

## 2018-04-07 NOTE — ED Notes (Signed)
Straight cath for urine. Pt tolerated well  Pt much more awake now. Updated on plan for MRI and is agreeable to this

## 2018-04-07 NOTE — ED Notes (Signed)
Transport contacted w ETA [id number]

## 2018-04-07 NOTE — ED Notes (Signed)
Transport contacted with an ETA 20 min

## 2018-04-08 LAB — BLOOD CULTURE
Blood Culture Result: NO GROWTH
Blood Culture Result: NO GROWTH

## 2018-04-12 ENCOUNTER — Telehealth (HOSPITAL_COMMUNITY): Payer: Self-pay

## 2018-04-12 DIAGNOSIS — I714 Abdominal aortic aneurysm, without rupture, unspecified (CMS-HCC): Secondary | ICD-10-CM

## 2018-04-12 NOTE — Telephone Encounter (Signed)
Patient is calling back to speak with Angelica. Please call at number below    [phone]

## 2018-04-12 NOTE — Telephone Encounter (Signed)
[name] a call from pt wanting to schd her Vascular Surg Consult ASAP. Per pt she was told when she went ot the [facility] ER 7/16 that she had a AAA & needed to be seen asap. I looked & do not reflect that a order was sent over to us. Per pt [facility] have [facility] but she does not want to go there. I informed pt I would be able to see the order if it had been placed for [facility] since we schd for both. I informed pt I would contact my Sup & conf how to get a copy of this order since I know the ER MD's can be hard to reach. Per pt her cell was lost recently so she is not avail @ that # asked that I call her back on [phone]. Pending reply & reecipt of order I will have NP Triage as STAT & call pt to schd.

## 2018-04-13 NOTE — Telephone Encounter (Signed)
Following up on prev notes below. I called pt & LMM for her w/status of her order & as soon as her ER MD signs the order & our NP Wilkie AyeKristy has Triaged I will call her back to get her schd asap per her request. [facility]    (prev notes)  Following up on prev notes below. I rece a reply from [facility] informing the order is not showing since it was not signed. It is in Epic under orders due to this as pending signature from Lolita Lenzarlile, Morgan Adam, MD. I tried to call the # listed for MD [phone] but [facility] I have forwarded Epic Staff message to MD to please sign the order ASAP since pt wants to schd. I will reach out to pt later in the a/m & give her a status. Pending reply from MD.    (prev notes)  [name] a call from pt wanting to schd her Vascular Surg Consult ASAP. Per pt she was told when she went ot the [place] ER 7/16 that she had a AAA & needed to be seen asap. I looked & do not reflect that a order was sent over to us. Per pt [facility] but she does not want to go there. I informed pt I would be able to see the order if it had been placed for [facility] since we schd for both. I informed pt I would contact my Sup & conf how to get a copy of this order since I know the ER MD's can be hard to reach. Per pt her cell was lost recently so she is not avail @ that # asked that I call her back on [phone]. Pending reply & reecipt of order I will have NP Triage as STAT & call pt to schd.

## 2018-04-13 NOTE — Telephone Encounter (Signed)
Following up on prev notes below. I rece a reply from [facility] informing the order is not showing since it was not signed. It is in Epic under orders due to this as pending signature from Lolita Lenzarlile, Morgan Adam, MD. I tried to call the # listed for MD [phone] but [facility] I have forwarded Epic Staff message to MD to please sign the order ASAP since pt wants to schd. I will reach out to pt later in the a/m & give her a status. Pending reply from MD.    (prev notes)  [name] a call from pt wanting to schd her Vascular Surg Consult ASAP. Per pt she was told when she went ot the [place] ER 7/16 that she had a AAA & needed to be seen asap. I looked & do not reflect that a order was sent over to us. Per pt [facility] but she does not want to go there. I informed pt I would be able to see the order if it had been placed for [facility] since we schd for both. I informed pt I would contact my Sup & conf how to get a copy of this order since I know the ER MD's can be hard to reach. Per pt her cell was lost recently so she is not avail @ that # asked that I call her back on [phone]. Pending reply & reecipt of order I will have NP Triage as STAT & call pt to schd.

## 2018-04-14 ENCOUNTER — Inpatient Hospital Stay
Admission: EM | Admit: 2018-04-14 | Discharge: 2018-04-20 | DRG: 897 | Disposition: A | Discharge status: Home / Self Care | Payer: Medicare Other | Attending: Psychiatry | Admitting: Psychiatry

## 2018-04-14 DIAGNOSIS — Z915 Personal history of self-harm: Secondary | ICD-10-CM

## 2018-04-14 DIAGNOSIS — K219 Gastro-esophageal reflux disease without esophagitis: Secondary | ICD-10-CM | POA: Diagnosis present

## 2018-04-14 DIAGNOSIS — G8929 Other chronic pain: Secondary | ICD-10-CM

## 2018-04-14 DIAGNOSIS — R45851 Suicidal ideations: Secondary | ICD-10-CM | POA: Diagnosis present

## 2018-04-14 DIAGNOSIS — S72142D Displaced intertrochanteric fracture of left femur, subsequent encounter for closed fracture with routine healing: Secondary | ICD-10-CM

## 2018-04-14 DIAGNOSIS — F329 Major depressive disorder, single episode, unspecified: Secondary | ICD-10-CM | POA: Diagnosis present

## 2018-04-14 DIAGNOSIS — Y92230 Patient room in hospital as the place of occurrence of the external cause: Secondary | ICD-10-CM | POA: Diagnosis not present

## 2018-04-14 DIAGNOSIS — E559 Vitamin D deficiency, unspecified: Secondary | ICD-10-CM | POA: Diagnosis present

## 2018-04-14 DIAGNOSIS — I714 Abdominal aortic aneurysm, without rupture: Secondary | ICD-10-CM | POA: Diagnosis present

## 2018-04-14 DIAGNOSIS — F1199 Opioid use, unspecified with unspecified opioid-induced disorder: Secondary | ICD-10-CM

## 2018-04-14 DIAGNOSIS — Z7409 Other reduced mobility: Secondary | ICD-10-CM

## 2018-04-14 DIAGNOSIS — R4587 Impulsiveness: Secondary | ICD-10-CM | POA: Diagnosis present

## 2018-04-14 DIAGNOSIS — T473X5A Adverse effect of saline and osmotic laxatives, initial encounter: Secondary | ICD-10-CM | POA: Diagnosis not present

## 2018-04-14 DIAGNOSIS — F17210 Nicotine dependence, cigarettes, uncomplicated: Secondary | ICD-10-CM | POA: Diagnosis present

## 2018-04-14 DIAGNOSIS — Z885 Allergy status to narcotic agent status: Secondary | ICD-10-CM

## 2018-04-14 DIAGNOSIS — F29 Unspecified psychosis not due to a substance or known physiological condition: Secondary | ICD-10-CM | POA: Diagnosis present

## 2018-04-14 DIAGNOSIS — K521 Toxic gastroenteritis and colitis: Secondary | ICD-10-CM | POA: Diagnosis not present

## 2018-04-14 DIAGNOSIS — M5441 Lumbago with sciatica, right side: Secondary | ICD-10-CM | POA: Diagnosis present

## 2018-04-14 DIAGNOSIS — Z79899 Other long term (current) drug therapy: Secondary | ICD-10-CM

## 2018-04-14 DIAGNOSIS — F1110 Opioid abuse, uncomplicated: Secondary | ICD-10-CM | POA: Diagnosis present

## 2018-04-14 DIAGNOSIS — Z6833 Body mass index (BMI) 33.0-33.9, adult: Secondary | ICD-10-CM

## 2018-04-14 DIAGNOSIS — E785 Hyperlipidemia, unspecified: Secondary | ICD-10-CM | POA: Diagnosis present

## 2018-04-14 DIAGNOSIS — G894 Chronic pain syndrome: Secondary | ICD-10-CM | POA: Diagnosis present

## 2018-04-14 DIAGNOSIS — F1120 Opioid dependence, uncomplicated: Secondary | ICD-10-CM | POA: Diagnosis present

## 2018-04-14 DIAGNOSIS — M5442 Lumbago with sciatica, left side: Secondary | ICD-10-CM | POA: Diagnosis present

## 2018-04-14 DIAGNOSIS — M797 Fibromyalgia: Secondary | ICD-10-CM | POA: Diagnosis present

## 2018-04-14 DIAGNOSIS — Z90710 Acquired absence of both cervix and uterus: Secondary | ICD-10-CM

## 2018-04-14 DIAGNOSIS — Z888 Allergy status to other drugs, medicaments and biological substances status: Secondary | ICD-10-CM

## 2018-04-14 DIAGNOSIS — F1510 Other stimulant abuse, uncomplicated: Secondary | ICD-10-CM | POA: Diagnosis present

## 2018-04-14 DIAGNOSIS — F19951 Other psychoactive substance use, unspecified with psychoactive substance-induced psychotic disorder with hallucinations: Principal | ICD-10-CM | POA: Diagnosis present

## 2018-04-14 DIAGNOSIS — Z8249 Family history of ischemic heart disease and other diseases of the circulatory system: Secondary | ICD-10-CM

## 2018-04-14 DIAGNOSIS — Z818 Family history of other mental and behavioral disorders: Secondary | ICD-10-CM

## 2018-04-14 DIAGNOSIS — F209 Schizophrenia, unspecified: Secondary | ICD-10-CM | POA: Diagnosis present

## 2018-04-14 DIAGNOSIS — Z560 Unemployment, unspecified: Secondary | ICD-10-CM

## 2018-04-14 DIAGNOSIS — F17200 Nicotine dependence, unspecified, uncomplicated: Secondary | ICD-10-CM

## 2018-04-14 DIAGNOSIS — K5900 Constipation, unspecified: Secondary | ICD-10-CM | POA: Diagnosis present

## 2018-04-14 DIAGNOSIS — I10 Essential (primary) hypertension: Secondary | ICD-10-CM | POA: Diagnosis present

## 2018-04-14 DIAGNOSIS — Z9884 Bariatric surgery status: Secondary | ICD-10-CM

## 2018-04-14 DIAGNOSIS — F1210 Cannabis abuse, uncomplicated: Secondary | ICD-10-CM | POA: Diagnosis present

## 2018-04-14 LAB — UR DRUGS OF ABUSE SCREEN
Amphetamines Screen: NEGATIVE
Barbiturates Screen: NEGATIVE
Benzodiazepine Screen: NEGATIVE
Cocaine Screen: NEGATIVE
Methadone Screen: NEGATIVE
Opiates Screen: NEGATIVE
Oxycodone Screen: NEGATIVE
Phencyclidine Screen: NEGATIVE
THC Screen: NEGATIVE

## 2018-04-14 LAB — COMPREHENSIVE METABOLIC PANEL, BLOOD
ALT (SGPT): 35 U/L — ABNORMAL HIGH (ref 0–33)
AST (SGOT): 22 U/L (ref 0–32)
Albumin: 4.1 g/dL (ref 3.5–5.2)
Alkaline Phos: 97 U/L (ref 35–140)
Anion Gap: 12 mmol/L (ref 7–15)
BUN: 17 mg/dL (ref 8–23)
Bicarbonate: 24 mmol/L (ref 22–29)
Bilirubin, Tot: 0.19 mg/dL (ref <1.2)
Calcium: 9.6 mg/dL (ref 8.5–10.6)
Chloride: 109 mmol/L — ABNORMAL HIGH (ref 98–107)
Creatinine: 0.87 mg/dL (ref 0.51–0.95)
GFR: >60 mL/min
Glucose: 144 mg/dL — ABNORMAL HIGH (ref 70–99)
Potassium: 4.5 mmol/L (ref 3.5–5.1)
Sodium: 145 mmol/L (ref 136–145)
Total Protein: 6.8 g/dL (ref 6.0–8.0)

## 2018-04-14 LAB — CBC WITH DIFF, BLOOD
ANC-Automated: 5.5 10*3/uL (ref 1.6–7.0)
Abs Basophils: 0 10*3/uL (ref <0.1)
Abs Eosinophils: 0.3 10*3/uL (ref 0.1–0.5)
Abs Lymphs: 1.7 10*3/uL (ref 0.8–3.1)
Abs Monos: 0.4 10*3/uL (ref 0.2–0.8)
Basophils: 0 %
Eosinophils: 4 %
Hct: 39.5 % (ref 34.0–45.0)
Hgb: 12.7 gm/dL (ref 11.2–15.7)
Lymphocytes: 22 %
MCH: 27.5 pg (ref 26.0–32.0)
MCHC: 32.2 g/dL (ref 32.0–36.0)
MCV: 85.5 um3 (ref 79.0–95.0)
MPV: 9.2 fL — ABNORMAL LOW (ref 9.4–12.4)
Monocytes: 5 %
Plt Count: 273 10*3/uL (ref 140–370)
RBC: 4.62 10*6/uL (ref 3.90–5.20)
RDW: 17.2 % — ABNORMAL HIGH (ref 12.0–14.0)
Segs: 69 %
WBC: 7.9 10*3/uL (ref 4.0–10.0)

## 2018-04-14 LAB — TSH, BLOOD: TSH: 2.53 u[IU]/mL (ref 0.27–4.20)

## 2018-04-14 MED ORDER — OLANZAPINE ODT 5 MG OR TBDP
5.00 mg | ORAL_TABLET | Freq: Once | ORAL | Status: AC
Start: 2018-04-14 — End: 2018-04-14
  Administered 2018-04-14: 5 mg via ORAL
  Filled 2018-04-14: qty 1

## 2018-04-14 NOTE — ED Notes (Signed)
This Clinical research associatewriter is Agricultural consultantbreaking CCP Alexandria Proctor. Pt is currently sleeping in bed in NAD. Bed locked and lowered with bed rails up x2 for safety. Pt is on 5150 dressed in buttoned psych gown with belongings locked in psych locker.

## 2018-04-14 NOTE — ED Notes (Signed)
This tech is in direct line of sight of pt. Pt is on 5150 for SI. Pt is currently anxious and follows directions.

## 2018-04-14 NOTE — ED MD Progress Note (Signed)
Workup Review as of Apr 14 2109   Others' Documentation   Wed Apr 14, 2018   1707 Per Psych, patient still psychotic.  Will be admitted [facility], screening labs will be obtained today.    [IB]   1444 Signed out to on-coming medical team. Patient on 5150 for DTS for desire to jump off building. Not agitated here after zyprexa 5 mg given. Patient to be re-assessed by psychiatry    [DL]   [id number] Psych: re-assess by psych    [DL]      Workup Review User Index  [DL] Arnoldo LenisLiu, Dennis Hong, MD  [IB] Belovarski, Georgette ShellIoan B, MD     Sign out: pending psych eval and recs

## 2018-04-14 NOTE — ED Notes (Signed)
This writer is finished sitting for this pt. Turnover back to CCP ToftreesAgnes.

## 2018-04-14 NOTE — ED Notes (Signed)
Turnover from YahooDanny RN,   On 5150 hold. Became anxious after roommate started shouting at each other.

## 2018-04-14 NOTE — ED MD Progress Note (Signed)
Sign out from Dr. Maryan Charerksen at 2:45PM  62F, SI no attempt, medically cleared.  Sitter at bedside, [employer] CorporationPsych recs.    Will CTM.    Update  Pending further recs.  Remains on hold.  Care signed out to Dr. Scharlene GlossNoste at [facility].

## 2018-04-14 NOTE — ED Notes (Signed)
This writer is breaking sitter for lunch.

## 2018-04-14 NOTE — ED Notes (Signed)
Pt sleeping in gurney, NAD on RA, 1:1 sitter in place, 5150 at front nurses station.

## 2018-04-14 NOTE — ED Notes (Signed)
Pt awake, calling out for her friend Monique: "Monique where are you? Help, help help." This RN has reoriented the patient to situation. She appears to have increasing agitation. Pt agreeable to taking zyprexa as ordered. Will continue to monitor.

## 2018-04-14 NOTE — ED Provider Notes (Signed)
Emergency Dept Provider Note    Chief Complaint:   Chief Complaint   Patient presents with    Suicidal Thinking     5150 BIB ALS/PD for SI - pt reporting SI at assisted living facility and endorses AH telling her the same - wants to jump from roof.  c/o anxiety over new diagnosis of cerebral anuerism.  A&Ox4, NAD on RA, ambulates, calm and directable.         HPI:  Alexandria Proctor is a 62 year old  female with PMH as below who presents on a 5150 for DTS with SI and AH. Patient says she may have been diagnosed with an aneurysm recently, unclear on details, and is very anxious and depressed about this. Has not attempted any harm to self but she told her ALF that she was thinking about it and they called the police. She denies any medical complaints, no focal weakness or numbness, no HA. Denies EtOH or illicit drug use.       ROS: All other systems reviewed and negative unless otherwise noted in the HPI or above. This was done per my custom and practice for systems appropriate to the chief complaint in an emergency department setting and varies depending on the quality of history that the patient is able to provide.      Home Medications:     What To Do With Your Medications      CONTINUE taking these medications      Add'l Info   acetaminophen 325 MG tablet  Commonly known as:  TYLENOL  Take 2 tablets (650 mg) by mouth every 4 hours as needed for Mild Pain (Pain Score 1-3).   Quantity:  30 tablet  Refills:  0     amLODIPINE 2.5 MG tablet  Commonly known as:  NORVASC  Take 1 tablet (2.5 mg) by mouth every morning (with breakfast).   Quantity:  30 tablet  Refills:  0     buPROPion 100 MG tablet  Commonly known as:  WELLBUTRIN  Take 100 mg by mouth 3 times daily.   Refills:  0     carisoprodol 350 MG tablet  Commonly known as:  SOMA  Take 350 mg by mouth every 6 hours.   Refills:  0     cyclobenzaprine 10 MG tablet  Commonly known as:  FLEXERIL  10 mg by Oral route.   Refills:  0     diphenhydrAMINE 50 MG/ML  injection  Commonly known as:  BENADRYL  Inject 0.5 mLs (25 mg) into vein every 6 hours as needed (itching).   Quantity:  1 vial  Refills:  0     * docusate sodium 250 MG capsule  Commonly known as:  COLACE  Take 1 capsule by mouth daily.   Quantity:  30 capsule  Refills:  0     * docusate sodium 250 MG capsule  Commonly known as:  COLACE  Take 1 capsule (250 mg) by mouth nightly.   Quantity:  60 capsule  Refills:  0     HYDROcodone-acetaminophen 5-325 MG tablet  Commonly known as:  NORCO   Refills:  0     hydrocortisone 2.5 % ointment  Apply 1 Application topically 2 times daily. Use a small amount as directed   Quantity:  1 Tube  Refills:  0     * lansoprazole 30 MG capsule  Commonly known as:  PREVACID  Take 1 capsule (30 mg) by mouth every morning (before  breakfast).   Quantity:  30 capsule  Refills:  0     * lansoprazole 30 MG capsule  Commonly known as:  PREVACID  Take 1 capsule (30 mg) by mouth 2 times daily (before meals).   Quantity:  30 capsule  Refills:  3     methadone 10 MG tablet  Commonly known as:  DOLOPHINE  Take 30 mg by mouth daily.   Refills:  0     * methocarbamol 750 MG tablet  Commonly known as:  ROBAXIN  Take 1 tablet (750 mg) by mouth 3 times daily.   Quantity:  9 tablet  Refills:  0     * methocarbamol 500 MG tablet  Commonly known as:  ROBAXIN   Refills:  0     nalOXone 0.4 MG/ML injection  Commonly known as:  NARCAN  Inject 0.25 mLs (0.1 mg) into vein every 2 minutes as needed (somnolence).   Quantity:  2 vial  Refills:  0     pantoprazole 20 MG tablet  Commonly known as:  PROTONIX  Take 20 mg by mouth daily.   Refills:  0     pregabalin 200 MG capsule  Commonly known as:  LYRICA  Take 1 capsule (200 mg) by mouth 3 times daily.   Quantity:  90 capsule  Refills:  0     * senna 8.6 MG tablet  Commonly known as:  SENOKOT  Take 1 tablet by mouth daily as needed for Constipation.   Quantity:  20 tablet  Refills:  0     * senna 8.6 MG tablet  Commonly known as:  SENOKOT  Take 2 tablets (17.2  mg) by mouth every morning.   Quantity:  30 tablet  Refills:  0     sodium chloride 0.9 % solution  Inject 10 mL/hr into vein continuous.   Quantity:  100 mL  Refills:  0     * tobramycin-dexamethasone ophthalmic ointment  Commonly known as:  TOBRADEX  3 (three) times a day.   Refills:  0     * tobramycin-dexamethasone ophthalmic suspension  Commonly known as:  TOBRADEX   Refills:  0         * This list has 10 medication(s) that are the same as other medications prescribed for you. Read the directions carefully, and ask your doctor or other care provider to review them with you.                Allergies: Compazine; Haldol [haloperidol]; Ondansetron; Reglan [metoclopramide]; Ultram [tramadol]; Zofran [ondansetron]; and Toradol    Past Medical History:  Past Medical History:   Diagnosis Date    Amphetamine abuse (CMS-HCC)     Bipolar 1 disorder (CMS-HCC)     Bipolar affective (CMS-HCC)     Fracture, intertrochanteric, left femur (CMS-HCC)     Gastric bypass status for obesity     H/O: hysterectomy     HLD (hyperlipidemia)     HTN (hypertension)     Manic depression (CMS-HCC)     Obesity     Opioid abuse (CMS-HCC)     Schizophrenia (CMS-HCC)        Past Surgical History:  Past Surgical History:   Procedure Laterality Date    GASTRIC BYPASS      hx gastric bypass      s/p IMN left IT fracture 07/20/16      STOMACH SURGERY      Ventral Hernia repair  Family History:  Family History   Problem Relation Name Age of Onset    No Known Problems Mother         Social History:   Social History     Tobacco Use    Smoking status: Current Every Day Smoker   Substance Use Topics    Alcohol use: Yes     Alcohol/week: 0.0 oz    Drug use: Yes     Types: Methamphetamines, Marijuana       Physical exam  Vital signs reviewed and noted:  Vitals:    04/14/18 2328 04/15/18 0600 04/15/18 0603 04/15/18 0908   BP: 139/69 133/70  133/70   BP Location:  Right arm     BP Patient Position:  Lying left side     Pulse: 55  55  55   Resp: 18 16     Temp: 97.9 F (36.6 C) 98.6 F (37 C)     SpO2: 97% 96%     Weight:  94.2 kg (207 lb 9.6 oz) 94.2 kg (207 lb 9.6 oz)    Height:  5\' 6"  (1.676 m) 5\' 6"  (1.676 m)        Physical Exam   Constitutional: She is oriented to person, place, and time. She appears well-developed and well-nourished. No distress.   HENT:   Head: Normocephalic and atraumatic.   Eyes: Conjunctivae and EOM are normal.   Neck: Normal range of motion. Neck supple.   Cardiovascular: Normal rate and regular rhythm.   Pulmonary/Chest: Effort normal. No respiratory distress.   Abdominal: Soft. There is no tenderness.   Musculoskeletal: Normal range of motion. She exhibits no edema.   Neurological: She is alert and oriented to person, place, and time.   Ambulates steadily, no focal deficits. Not clearly RIS.    Skin: Skin is warm and dry. She is not diaphoretic.   Psychiatric: She has a normal mood and affect. She expresses suicidal ideation. She expresses no homicidal ideation. She expresses suicidal plans. She expresses no homicidal plans.   Vitals reviewed.      Assessment/Clinical Decision-making/Plan/ED Course  In summary, Alexandria Proctor is a 62 year old  female with PMH as above who presented on a 5150 with SI and AH in the setting of new onset stressors. She has a hx of meth use in her chart but denied it on this visit. Non-toxic, non-focal exam with SI endorsed and weak plan, Psych consulted for 5150 and SBH w/u started in anticipation of admission need.     Orders Placed This Encounter   Procedures    X-Ray Abdomen Single View    Urine Immunoassay Drug Screen    Comprehensive Metabolic Panel Green    CBC w/ Diff Lavender    Urinalysis with Culture Reflex, when indicated    TSH, Blood - See Instructions    Urinalysis with Culture Reflex, when indicated    Glycosylated Hgb(A1C), Blood Lavender    HIV-Antibody Rapid Test, Blood Lavender & Yellow Serum Seperator Tube    Lipid Panel Green Plasma Separator Tube     Magnesium, Blood Green Plasma Separator Tube    Phosphorus, Blood Green Plasma Separator Tube    Syphilis Screen, Blood Yellow serum separator tube    Vitamin B1, Blood Green    Vitamin B12, Blood Green Plasma Separator Tube    Vitamin D, 25-Hydroxy, Blood Yellow serum separator tube    IP Consult to Psychiatry    IP Consult to Geriatrics  ECG 12 Lead       Labs Reviewed   COMPREHENSIVE METABOLIC PANEL, BLOOD - Abnormal; Notable for the following components:       Result Value    Glucose 144 (*)     Chloride 109 (*)     ALT (SGPT) 35 (*)     All other components within normal limits   CBC WITH DIFF, BLOOD - Abnormal; Notable for the following components:    RDW 17.2 (*)     MPV 9.2 (*)     All other components within normal limits   URINALYSIS WITH CULTURE REFLEX, WHEN INDICATED - Abnormal; Notable for the following components:    Ketones Trace (*)     Bilirubin 1+ (*)     CAOX Crystal Many (*)     All other components within normal limits   URINE IMMUNOASSAY DRUG SCREEN   TSH, BLOOD   URINALYSIS WITH CULTURE REFLEX, WHEN INDICATED   GLYCOSYLATED HGB(A1C), BLOOD   HIV-ANTIBODY RAPID TEST, BLOOD   LIPID(CHOL FRACT) PANEL, BLOOD   MAGNESIUM, BLOOD   PHOSPHORUS, BLOOD   SYPHILIS EIA SCREEN, BLOOD   VITAMIN B1, BLOOD   VITAMIN B12, BLOOD   VITAMIN D, 25-HYDROXY, BLOOD   MRSA SURVEILLANCE CULTURE       Medications   amLODIPINE (NORVASC) tablet 2.5 mg (2.5 mg Oral Given 04/15/18 0908)   lansoprazole (PREVACID) DR capsule 30 mg (has no administration in time range)   acetaminophen (TYLENOL) tablet 650 mg (has no administration in time range)   bisacodyl (DULCOLAX) suppository 10 mg (has no administration in time range)   OLANZapine (ZYPREXA ZYDIS) disintegrating tablet 2.5 mg (has no administration in time range)   buPROPion (WELLBUTRIN XL) XL tablet 300 mg (has no administration in time range)   polyethylene glycol (MIRALAX) packet 17 g (17 g Oral Given 04/15/18 1026)   OLANZapine (ZYPREXA ZYDIS)  disintegrating tablet 5 mg (5 mg Oral Given 04/14/18 0346)   OLANZapine (ZYPREXA ZYDIS) disintegrating tablet 5 mg (5 mg Oral Given 04/14/18 0803)   OLANZapine (ZYPREXA ZYDIS) disintegrating tablet 5 mg (5 mg Oral Given 04/14/18 1905)       X-Ray Abdomen Single View   Final Result   IMPRESSION:   Excess colonic feces          Patient seen and discussed with attending, MD Reather Littler Ree Kida) R. Cathi Roan, MD, JD  PGY-4, Emergency Medicine    NOTE: This H&P represents the initial encounter and assessment of patient. The remainder of patient's hospital course may be described in a separate progress note. Please see that note for further details about patient's hospital course and evolution of medical decision-making, evaluation, and management.        Conchita Paris, MD  Resident  04/15/18 1031       Shishlov, Caroleen Hamman, MD  04/22/18 [phone]

## 2018-04-14 NOTE — ED Notes (Signed)
This RN made first contact with patient. Pt in supine position. Room cleared of all harmful objects, no telemetry cables in room. Pt in psychiatric gown/pants. Sitter at bedside for direct observation. Pt in direct line of sight of sitter. Sitter informed to notify this RN/staff if there are any issues. Sitter verbalizes understanding.    -Sitter aware of legal status: 5150 DTS  -Sitter aware to notify this RN for any assistance  -Sitter aware of SI PRECAUTIONS

## 2018-04-14 NOTE — ED MD Progress Note (Signed)
EMERGENCY DEPARTMENT SIGN-OUT NOTE  [facility] electronic medical record has been reviewed for pertinent medical history.     Triage:  Patient arrived to the Emergency Department with complaint of: Suicidal Thinking (5150 BIB ALS/PD for SI - pt reporting SI at assisted living facility and endorses AH telling her the same - wants to jump from roof.  c/o anxiety over new diagnosis of cerebral anuerism.  A&Ox4, NAD on RA, ambulates, calm and directable.  )      History:  Past Medical History:   Diagnosis Date    Amphetamine abuse (CMS-HCC)     Bipolar 1 disorder (CMS-HCC)     Bipolar affective (CMS-HCC)     Fracture, intertrochanteric, left femur (CMS-HCC)     Gastric bypass status for obesity     H/O: hysterectomy     HLD (hyperlipidemia)     HTN (hypertension)     Manic depression (CMS-HCC)     Obesity     Opioid abuse (CMS-HCC)     Schizophrenia (CMS-HCC)        Past Surgical History:   Procedure Laterality Date    GASTRIC BYPASS      hx gastric bypass      s/p IMN left IT fracture 07/20/16      STOMACH SURGERY      Ventral Hernia repair         Labs:  Labs Reviewed   URINE IMMUNOASSAY DRUG SCREEN       Imaging:  No orders to display         Assessment & Plan:  62 year old female presents with 5150 for SI (wanting to jump off building).  Got Zyprexa.  Medically cleared, Psych aware.      Additional evaluation and work-up still pending:  [x ] Psych -> [facility]    Dispo Plan: Urine -> [facility]    The Date of Service for the Emergency Room encounter is 04/14/2018  2:39 AM       *This note was dictated with speech-to-text software.  Please excuse any typos, and contact provider directly if questions arise regarding any of the information herein.  ----------------------------------------------------------------------------------------------------------------------      WORK-UP REVIEW:  Workup Review as of Apr 14 2356   Sula RumpleBelovarski, Jaciel Diem [facility]   Wed Apr 14, 2018   1707 Per Psych,  patient still psychotic.  Will be admitted [facility], screening labs will be obtained today.         Others' Documentation   Wed Apr 14, 2018   1444 Signed out to on-coming medical team. Patient on 5150 for DTS for desire to jump off building. Not agitated here after zyprexa 5 mg given. Patient to be re-assessed by psychiatry    [DL]   [id number] Psych: re-assess by psych    [DL]      Workup Review User Index  [DL] Arnoldo LenisLiu, Dennis Hong, MD

## 2018-04-14 NOTE — ED MD Progress Note (Signed)
Workup Review as of Apr 15 721   Others' Documentation   Wed Apr 14, 2018   1707 Per Psych, patient still psychotic.  Will be admitted [facility], screening labs will be obtained today.    [IB]   1444 Signed out to on-coming medical team. Patient on 5150 for DTS for desire to jump off building. Not agitated here after zyprexa 5 mg given. Patient to be re-assessed by psychiatry    [DL]   [id number] Psych: re-assess by psych    [DL]      Workup Review User Index  [DL] Arnoldo LenisLiu, Dennis Hong, MD  [IB] Belovarski, Georgette ShellIoan B, MD     Sign out from - belovarski    Brief hx and course:   - 5150, +SI  - psych consulted, [facility]    Plan:   - need to f/u with urine sample  - f/u KUB  - call psych once urine done    Labs  Results for orders placed or performed during the hospital encounter of 04/14/18   Urine Immunoassay Drug Screen   Result Value Ref Range    Amphetamines Screen Negative Negative    Barbiturates Screen Negative Negative    Cocaine Screen Negative Negative    Benzodiazepine Screen Negative Negative    Methadone Screen Negative Negative    Opiates Screen Negative Negative    Oxycodone Screen Negative Negative    Phencyclidine Screen Negative Negative    THC Screen Negative Negative    UR Drug Screen Interpretation See Comment Negative   Comprehensive Metabolic Panel Green   Result Value Ref Range    Glucose 144 (H) 70 - 99 mg/dL    BUN 17 8 - 23 mg/dL    Creatinine 9.600.87 4.540.51 - 0.95 mg/dL    GFR >09>60 mL/min    Sodium 145 136 - 145 mmol/L    Potassium 4.5 3.5 - 5.1 mmol/L    Chloride 109 (H) 98 - 107 mmol/L    Bicarbonate 24 22 - 29 mmol/L    Anion Gap 12 7 - 15 mmol/L    Calcium 9.6 8.5 - 10.6 mg/dL    Total Protein 6.8 6.0 - 8.0 g/dL    Albumin 4.1 3.5 - 5.2 g/dL    Bilirubin, Tot 8.110.19 <1.2 mg/dL    AST (SGOT) 22 0 - 32 U/L    ALT (SGPT) 35 (H) 0 - 33 U/L    Alkaline Phos 97 35 - 140 U/L   CBC w/ Diff Lavender   Result Value Ref Range    WBC 7.9 4.0 - 10.0 1000/mm3    RBC 4.62 3.90 - 5.20 mill/mm3    Hgb 12.7 11.2  - 15.7 gm/dL    Hct 91.439.5 78.234.0 - 95.645.0 %    MCV 85.5 79.0 - 95.0 um3    MCH 27.5 26.0 - 32.0 pgm    MCHC 32.2 32.0 - 36.0 g/dL    RDW 21.317.2 (H) 08.612.0 - 14.0 %    MPV 9.2 (L) 9.4 - 12.4 fL    Plt Count 273 140 - 370 1000/mm3    Segs 69 %    Lymphocytes 22 %    Monocytes 5 %    Eosinophils 4 %    Basophils 0 %    ANC-Automated 5.5 1.6 - 7.0 1000/mm3    Abs Lymphs 1.7 0.8 - 3.1 1000/mm3    Abs Monos 0.4 0.2 - 0.8 1000/mm3    Abs Eosinophils 0.3 <0.1 - 0.5 1000/mm3  Abs Basophils 0.0 <0.1 1000/mm3    Diff Type Automated    TSH, Blood - See Instructions   Result Value Ref Range    TSH 2.53 0.27 - 4.20 uIU/mL   Urinalysis with Culture Reflex, when indicated   Result Value Ref Range    Type Not Specified     Color Yellow Yellow    Appearance Slightly Hazy Clear    Specific Gravity 1.026 1.002 - 1.030    pH 5.0 5.0 - 8.0    Protein Negative Negative    Glucose Negative Negative    Ketones Trace (A) Negative    Bilirubin 1+ (A) Negative    Blood Negative Negative    Urobilinogen Negative Negative    Nitrite Negative Negative    Leuk Esterase Negative Negative    WBC 0-2 0-2/HPF    RBC 0-2 0-2/HPF    Bacteria None None-Rare/HPF    Squam. Epithelial Cell 0-5(RARE) <6-10(FEW)    Mucus Rare None-Rare/HPF    CAOX Crystal Many (A) None-Rare/HPF       Diagnostic Studies  X-Ray Abdomen Single View    (Results Pending)   Prelim: unremarkable for acute process. Showing stool burden.       ED Course at this time:    Latest vital signs:   Vitals:    04/14/18 0327 04/14/18 2328 04/15/18 0600 04/15/18 0603   BP: 100/61 139/69 133/70    BP Location: Left arm  Right arm    BP Patient Position: Sitting  Lying left side    Pulse: 66 55 55    Resp: 18 18 16     Temp: 98 F (36.7 C) 97.9 F (36.6 C) 98.6 F (37 C)    SpO2: 99% 97% 96%    Weight:   94.2 kg (207 lb 9.6 oz) 94.2 kg (207 lb 9.6 oz)   Height:   5\' 6"  (1.676 m) 5\' 6"  (1.676 m)         - UA was negative for UTI, preliminary KUB unremarkable for acute process.  Patient was admitted to  the [facility].

## 2018-04-14 NOTE — ED Notes (Signed)
Psychiatrist at bedside

## 2018-04-14 NOTE — ED Notes (Signed)
Assisting primary RN.  Pt sleeping on cot, nad,  Sitter present.

## 2018-04-14 NOTE — ED Notes (Signed)
Pt is currently sleeping in nad.

## 2018-04-14 NOTE — Telephone Encounter (Signed)
I found the option to sign the referral. Please let me know if there is any further needs.

## 2018-04-14 NOTE — ED Notes (Signed)
This tech gave turn over to CCP. This tech is no longer in direct line of sight of pt.

## 2018-04-14 NOTE — ED MD Progress Note (Signed)
Workup Review as of Apr 15 1443   Alexandria CrockerLiu, [name] [name] Documentation   Wed Apr 14, 2018   1444 Signed out to on-coming medical team. Patient on 5150 for DTS for desire to jump off building. Not agitated here after zyprexa 5 mg given. Patient to be re-assessed by psychiatry      [phone] Psych: re-assess by psych        62 yo F p/w suicidal thinking wanting to jump off building.    5150 DTS    No medical complaints. Known AAA 3.1 cm    [x]  f/u psych - will re-assess
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
## 2018-04-14 NOTE — ED Notes (Signed)
Per psych resident - the pt can now be a 1:3 sitter ratio

## 2018-04-14 NOTE — ED Notes (Signed)
Pt sleeping in gurney, NAD on RA, RR 15, even and unlabored.  5150/SI precautions and sitter in place.  Diet ordered, pending Psych re-eval in am

## 2018-04-14 NOTE — Progress Notes (Signed)
[facility].

## 2018-04-14 NOTE — ED Notes (Signed)
04/14/2018 6:11 PM Alexandria Proctor    Two EKG copies handed to/signed by Dr. Jeannine KittenFarah.   Signed copy placed in EKG folder. EKG transmitted.

## 2018-04-14 NOTE — ED Notes (Signed)
Patient Handoff: Report received from [place]    1. Name: Alexandria Proctor MR#: [id number]  2. [facility]  Hospital Service: ED  3. Pertinent medical history including: Meth abuse  a. Diagnosis: 5150 DTS- plan to jump off a building  b. Current condition and code status: Stable, full code  c. Anticipated changes in condition, treatment or action needing follow-up: Metabolize, pending psych reassessment  d. Review of pertinent medications: Zyprexa for agitatin   e. Pain management: NA

## 2018-04-14 NOTE — ED MD Progress Note (Addendum)
62 year old with suicidality related to her very small known AAA of 3.1 cm, thinking of jumping off the building, on 5150, abdomen soft nontender, medically cleared, pending psych

## 2018-04-14 NOTE — Event / Update (Signed)
Date Sold Last Name First Name MI DOB Gender Address Compact Drug Name Form Drug Strength Qty Days Supply Species Code Prescriber Name Presc. DEA# Pharmacy Name   2018-03-29 Reina FuseROUSSEAU Kyarah  [birth year] F [address]   [place] [id number] N HYDROCODONE BITARTRATE-ACETAMINOPHE TAB 325 MG-10 MG 120 30 01 Lacie DraftKLANE, SHIRA, [place], [place] [id number] [facility] [phone]   2018-03-29 [facility]  [birth year] F [address]   [place], [place] [id number] N CARISOPRODOL TAB 350 MG 30 30 01 Lacie DraftKLANE, SHIRA, [place], [place] [id number] [facility] [phone]   2018-03-05 [place] Derenda  [birth year] F [address]   [place], [place] [id number] N LYRICA CAP 150 MG 60 30 01 Windell MomentLICHTENSTEIN, BERNARD [id number] [id number]   2018-02-26 [place] Dealie  [birth year] F [address]   [place], [place] [id number] N HYDROCODONE BITARTRATE-ACETAMINOPHE TAB 325 MG-7.5 MG 120 30 01 Rudell CobbSTEINER, ALICJA S [id number] [facility]   2018-02-25 [place] Geneviene  [birth year] F [address]   [place], [place] [id number] N CARISOPRODOL TAB 350 MG 30 30 01 Rudell CobbSTEINER, ALICJA S [id number] [facility]   2018-02-11 [place] Lynette  [birth year] F [address]   [place], [place] [id number] N HYDROCODONE BITARTRATE-ACETAMINOPHE TAB 325 MG-7.5 MG 10 5 01 LICHTENSTEIN, BERNARD [id number]     CURES Report
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
## 2018-04-14 NOTE — Interdisciplinary (Signed)
SW NOTE:    SW attempted to contact [facility] to verify pt's suboxone dose.  Office closed.  Spoke with intake line and they are not able to verify.  Called [phone] and spoke with after hours line and they could not verify either.  Suggested to call back in the morning.    Nathaniel ManLeslie Odarius Dines, LCSW  Clinical Social Worker

## 2018-04-14 NOTE — Telephone Encounter (Signed)
I attempted to sign the referral to Vascular surgery tonight but a system error prevented this. It was pended as a discharge order for the discharge physician to sign as I signed out the patient to a new ED MD team prior to discharge. Now the system will not allow me to sign the order as we are now after 96 hours post discharge. Please advise how I may help.

## 2018-04-14 NOTE — ED Notes (Signed)
Meal tray provided, tolerating well.
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
## 2018-04-15 ENCOUNTER — Emergency Department (HOSPITAL_COMMUNITY): Payer: Medicare Other

## 2018-04-15 ENCOUNTER — Encounter (HOSPITAL_COMMUNITY): Payer: Self-pay

## 2018-04-15 DIAGNOSIS — E785 Hyperlipidemia, unspecified: Secondary | ICD-10-CM

## 2018-04-15 DIAGNOSIS — G8929 Other chronic pain: Secondary | ICD-10-CM

## 2018-04-15 DIAGNOSIS — I10 Essential (primary) hypertension: Secondary | ICD-10-CM

## 2018-04-15 DIAGNOSIS — F329 Major depressive disorder, single episode, unspecified: Secondary | ICD-10-CM | POA: Diagnosis present

## 2018-04-15 DIAGNOSIS — E559 Vitamin D deficiency, unspecified: Secondary | ICD-10-CM

## 2018-04-15 DIAGNOSIS — R45851 Suicidal ideations: Secondary | ICD-10-CM | POA: Diagnosis present

## 2018-04-15 DIAGNOSIS — M5442 Lumbago with sciatica, left side: Secondary | ICD-10-CM

## 2018-04-15 DIAGNOSIS — Z79899 Other long term (current) drug therapy: Secondary | ICD-10-CM

## 2018-04-15 DIAGNOSIS — K5900 Constipation, unspecified: Secondary | ICD-10-CM

## 2018-04-15 DIAGNOSIS — R109 Unspecified abdominal pain: Secondary | ICD-10-CM

## 2018-04-15 DIAGNOSIS — M5441 Lumbago with sciatica, right side: Secondary | ICD-10-CM

## 2018-04-15 DIAGNOSIS — M797 Fibromyalgia: Secondary | ICD-10-CM

## 2018-04-15 DIAGNOSIS — R195 Other fecal abnormalities: Secondary | ICD-10-CM

## 2018-04-15 DIAGNOSIS — F1910 Other psychoactive substance abuse, uncomplicated: Secondary | ICD-10-CM

## 2018-04-15 DIAGNOSIS — K219 Gastro-esophageal reflux disease without esophagitis: Secondary | ICD-10-CM | POA: Insufficient documentation

## 2018-04-15 DIAGNOSIS — Z9884 Bariatric surgery status: Secondary | ICD-10-CM

## 2018-04-15 LAB — ECG 12-LEAD
ATRIAL RATE: 65 {beats}/min
ECG INTERPRETATION: NORMAL
P AXIS: 71 degrees
PR INTERVAL: 172 ms
QRS INTERVAL/DURATION: 86 ms
QT: 424 ms
QTC INTERVAL: 440 ms
R AXIS: -5 degrees
T AXIS: 24 degrees
VENTRICULAR RATE: 65 {beats}/min

## 2018-04-15 LAB — URINALYSIS WITH CULTURE REFLEX, WHEN INDICATED
Blood: NEGATIVE
Glucose: NEGATIVE
Leuk Esterase: NEGATIVE
Nitrite: NEGATIVE
Protein: NEGATIVE
Specific Gravity: 1.026 (ref 1.002–1.030)
Urobilinogen: NEGATIVE
pH: 5 (ref 5.0–8.0)

## 2018-04-15 LAB — GLYCOSYLATED HGB(A1C), BLOOD: Glyco Hgb (A1C): 5.3 % (ref 4.8–5.8)

## 2018-04-15 LAB — HIV-ANTIBODY RAPID TEST, BLOOD: HIV 1/2 Rapid Antibody Test: NONREACTIVE

## 2018-04-15 LAB — PHOSPHORUS, BLOOD: Phosphorous: 3.6 mg/dL (ref 2.7–4.5)

## 2018-04-15 LAB — MAGNESIUM, BLOOD: Magnesium: 2 mg/dL (ref 1.6–2.4)

## 2018-04-15 MED ORDER — BUPROPION XL (DAILY) 150 MG OR TB24
300.00 mg | ORAL_TABLET | Freq: Every day | ORAL | Status: DC
Start: 2018-04-15 — End: 2018-04-16
  Administered 2018-04-15 – 2018-04-16 (×2): 300 mg via ORAL
  Filled 2018-04-15 (×2): qty 2

## 2018-04-15 MED ORDER — OLANZAPINE ODT 5 MG OR TBDP
2.50 mg | ORAL_TABLET | Freq: Three times a day (TID) | ORAL | Status: DC | PRN
Start: 2018-04-15 — End: 2018-04-19
  Administered 2018-04-15 – 2018-04-19 (×17): 2.5 mg via ORAL
  Filled 2018-04-15 (×21): qty 1

## 2018-04-15 MED ORDER — DOCUSATE SODIUM 250 MG OR CAPS
250.00 mg | ORAL_CAPSULE | Freq: Every day | ORAL | Status: DC
Start: 2018-04-15 — End: 2018-04-15

## 2018-04-15 MED ORDER — CARISOPRODOL 350 MG OR TABS
350.00 mg | ORAL_TABLET | Freq: Four times a day (QID) | ORAL | Status: DC
Start: 2018-04-15 — End: 2018-04-15

## 2018-04-15 MED ORDER — POLYETHYLENE GLYCOL 3350 OR PACK
17.00 g | PACK | Freq: Two times a day (BID) | ORAL | Status: DC
Start: 2018-04-15 — End: 2018-04-16
  Administered 2018-04-15 – 2018-04-16 (×4): 17 g via ORAL
  Filled 2018-04-15 (×3): qty 1

## 2018-04-15 MED ORDER — OLANZAPINE ODT 5 MG OR TBDP
2.50 mg | ORAL_TABLET | Freq: Once | ORAL | Status: AC
Start: 2018-04-15 — End: 2018-04-15
  Administered 2018-04-15 (×2): 2.5 mg via ORAL

## 2018-04-15 MED ORDER — LIDOCAINE 5 % EX PTCH
1.00 | MEDICATED_PATCH | Freq: Once | CUTANEOUS | Status: AC
Start: 2018-04-15 — End: 2018-04-16
  Administered 2018-04-15: 1 via TRANSDERMAL
  Filled 2018-04-15: qty 1

## 2018-04-15 MED ORDER — GABAPENTIN 300 MG OR CAPS
300.00 mg | ORAL_CAPSULE | Freq: Three times a day (TID) | ORAL | Status: DC
Start: 2018-04-15 — End: 2018-04-15
  Filled 2018-04-15: qty 1

## 2018-04-15 MED ORDER — BUPROPION HCL 100 MG OR TABS
100.00 mg | ORAL_TABLET | Freq: Three times a day (TID) | ORAL | Status: DC
Start: 2018-04-15 — End: 2018-04-15
  Filled 2018-04-15: qty 1

## 2018-04-15 MED ORDER — ACETAMINOPHEN 325 MG PO TABS
650.00 mg | ORAL_TABLET | ORAL | Status: DC | PRN
Start: 2018-04-15 — End: 2018-04-20
  Administered 2018-04-15 – 2018-04-20 (×7): 650 mg via ORAL
  Filled 2018-04-15 (×7): qty 2

## 2018-04-15 MED ORDER — NICOTINE 14 MG/24HR TD PT24
1.00 | MEDICATED_PATCH | Freq: Every day | TRANSDERMAL | Status: DC
Start: 2018-04-15 — End: 2018-04-20
  Administered 2018-04-15 – 2018-04-20 (×7): 1 via TRANSDERMAL
  Filled 2018-04-15 (×6): qty 1

## 2018-04-15 MED ORDER — AMLODIPINE 2.5 MG OR TABS
2.50 mg | ORAL_TABLET | Freq: Every day | ORAL | Status: DC
Start: 2018-04-15 — End: 2018-04-15
  Administered 2018-04-15: 2.5 mg via ORAL
  Filled 2018-04-15: qty 1

## 2018-04-15 MED ORDER — LANSOPRAZOLE 30 MG OR CPDR
30.00 mg | DELAYED_RELEASE_CAPSULE | Freq: Two times a day (BID) | ORAL | Status: DC
Start: 2018-04-15 — End: 2018-04-20
  Administered 2018-04-15 – 2018-04-20 (×12): 30 mg via ORAL
  Filled 2018-04-15 (×11): qty 1

## 2018-04-15 MED ORDER — CYCLOBENZAPRINE HCL 10 MG OR TABS
10.00 mg | ORAL_TABLET | Freq: Three times a day (TID) | ORAL | Status: DC | PRN
Start: 2018-04-15 — End: 2018-04-15

## 2018-04-15 MED ORDER — POLYETHYLENE GLYCOL 3350 OR PACK
17.00 g | PACK | Freq: Every day | ORAL | Status: DC
Start: 2018-04-15 — End: 2018-04-15

## 2018-04-15 MED ORDER — BISACODYL 10 MG RE SUPP
10.00 mg | Freq: Every day | RECTAL | Status: DC | PRN
Start: 2018-04-15 — End: 2018-04-20
  Administered 2018-04-15 – 2018-04-19 (×2): 10 mg via RECTAL
  Filled 2018-04-15 (×2): qty 1

## 2018-04-15 MED ORDER — DOCUSATE SODIUM 250 MG OR CAPS
250.00 mg | ORAL_CAPSULE | Freq: Every evening | ORAL | Status: DC
Start: 2018-04-15 — End: 2018-04-15

## 2018-04-15 NOTE — Plan of Care (Addendum)
Problem: Promotion of Mental Health and Safety  Goal: The pt remains safe, receives appropriate treatment and achieves outcomes (psychologically, psychosocially, physically, and spiritually) within the limitations of the disease process by discharge.  04/15/2018 2012 by Wonda ChengNoveda, Niamh Rada, RN  Outcome: Not Progressing  Flowsheets  Taken 04/15/2018 2012 by Wonda ChengNoveda, Pinkney Venard, RN  Patient /Family stated Goal: " I want to get better. I don't want that outbursts".  Guidelines: Inpatient Nursing Guidelines;Mental Health Assessment;Mood disorder;Thought disorder;Psychosis;Suicide Assessment and Precautions;Behavioral Issues  Taken 04/15/2018 1410 by Theophilus BonesWraye, [place], RN  Individualized Interventions/Recommendations #1: Assess orientation, SI/HI, AVH  Individualized Interventions/Recommendations #2: Educate and encourage medication compliance  Individualized Interventions/Recommendations #3: Assess for pain and discomfort, provide PRN pharm and non-pharm interventions  Individualized Interventions/Recommendations #4: Assess gait, educate pt about fall prevention. Encourage w/c and FWW for safety  Individualized Interventions/Recommendations #5: Provide PRN medication for agitation.  Note:   Seen patient in the living area at the start of the shift, anxious, with angry outburst, irritable, increasingly agitated, complaining of restless legs.  She was given Zyprexa 2.5 mg at 1726 hours which was not effective.  She was given Zyprexa 2.5 mg again at 1936 hours, for the same reason and was demanding, and medication seeking. The medication helped as patient was able to calm down. She ate 100 % for dinner andt was offered food at HS, ate crackers, peanut butter, banana, and drunk juice. She was then assisted back to bed. She stated, "please don't bother me anymore when I'm asleep."   04/15/2018 1940 by Wonda ChengNoveda, Amar Sippel, RN  Outcome: Not Progressing
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
## 2018-04-15 NOTE — Telephone Encounter (Signed)
Following up on prev notes below. I rece reply below & can now see referral now & see it is listed as the wrong dept MON I have updated to [facility] & sent request to NP as STAT to Triage. Once done I will call [place] back [place].    I found the option to sign the referral. Please let me know if there is any further needs.         Documentation       Carlile, Gaynelle ArabianMorgan Adam, MD Yesterday (12:06 AM)         I attempted to sign the referral to Vascular surgery tonight but a system error prevented this. It was pended as a discharge order for the discharge physician to sign as I signed out the patient to a new ED MD team prior to discharge. Now the system will not allow me to sign the order as we are now after 96 hours post discharge. Please advise how I may help.         Documentation      (prev notes)  Following up on prev notes below. I called pt & LMM for her w/status of her order & as soon as her ER MD signs the order & our NP Wilkie AyeKristy has Triaged I will call her back to get her schd asap per her request. ([place]    (prev notes)  Following up on prev notes below.I rece a reply from [facility] informing the order is not showing since it was not signed. It is in Epic under orders due to this as pending signature fromCarlile, Gaynelle ArabianMorgan Adam, MD. I tried to call the # listed for [phone] [facility]. I have forwarded Epic Staff message to MD to please sign the order ASAP since pt wants to schd. I will reach out to pt later in the a/m & give her a status. Pending reply from MD.    (prev notes)  [name] a call from pt wanting to schd her Vascular Surg Consult ASAP. Per pt she was told when she went ot the [facility] 7/16 that she had a AAA & needed to be seen asap. I looked & do not reflect that a order was sent over to us. Per pt [facility] but she does not want to go there. I informed pt I would be able to see the order if it had been placed for  [facility] since we schd for both. I informed pt I would contact my Sup & conf how to get a copy of this order since I know the ER MD's can be hard to reach. Per pt her cell was lost recently so she is not avail @ that # asked that I call her back on [phone]. Pending reply & reecipt of order I will have NP Triage as STAT & call pt to schd.

## 2018-04-15 NOTE — ED Notes (Signed)
Ambulatory to restroom
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
## 2018-04-15 NOTE — ED Notes (Addendum)
This Clinical research associatewriter is Geologist, engineeringbreaking sitter CCP Agnes. Pt is currently sleeping in bed in NAD. Bed rails up x2, bed locked and lowered for safety. Pt is on 5150 dressed in buttoned psych gown.

## 2018-04-15 NOTE — Interdisciplinary (Signed)
PT Contact     Row Name 04/15/18 1431       Therapy Contact Note    Contact Time  1402    Therapy not provided at this time as  Patient/family/caregiver declined treatment. Pt reported she would not participate with therapy 2/2 pain in back and shoulders, despite being provided with pain meds. Pt became agitated with PT asking to leave room during education on benefits of OOB mobility.

## 2018-04-15 NOTE — Plan of Care (Signed)
Geropsychiatry Interidisciplinary Treatment Plan         MR#: [id number]  Donnita FallsROUSSEAU, Kenady V  CSN: [id number]  DOB: 12/18/1955 F       Admission Date: 04/14/2018      Legal Status: 72 Hour Hold                PATIENT STRENGTHS:  Patient Strengths: Adequate financial resources;Other (Comment)    IDENTIFIED PROBLEMS:   Active Hospital Problems    Diagnosis    *Major depressive disorder [F32.9]    Suicidal ideation [R45.851]    Vitamin D deficiency [E55.9]    Fibromyalgia [M79.7]    Chronic pain [G89.29]    Amphetamine abuse (CMS-HCC) [F15.10]    Opioid abuse (CMS-HCC) [F11.10]    HTN (hypertension) [I10]    HLD (hyperlipidemia) [E78.5]    Obesity [E66.9]      Resolved Hospital Problems   No resolved problems to display.       ANTICIPATED DISCHARGE PLAN: Return home    Discharge Criteria:   - Patient will experience no hallucinations, verbal or auditory, that trigger problem behaviors requiring medications for 48 consecutive hours (or two consecutive days).    - Patient will have no verbalizations of suicide ideations for 48 consecutive hours (or two consecutive days).      - Appropriate aftercare plans will be established based on the patient's level of care needs, cognitive strengths and weaknesses, geographical location, and financial criteria.    Patient-Stated Goal: "To not have pain"    Estimated length of stay: 7-10 days           _________________________         ____/____/___      _________  Patient Signature                                      Date                    Time      _________________________  Printed Name      Patient refused to sign:   Yes       No      _________________________         ____/____/___      _________  Witness          Date                    Time

## 2018-04-15 NOTE — Telephone Encounter (Signed)
Following up on prev notes below. I rece a call from the call center informing pt was calling to schd. I requested they inform pt that the ER MD has now signed the order but our NP still needs to finish her Triage of order 1st before we can schd. Per rep they will let her know.    (prev notes)  Following up on prev notes below. I rece reply below & can now see referral now & see it is listed as the wrong dept MON I have updated to [facility] & sent request to NP as STAT to Triage. Once done I will call pt back [place].    I found the option to sign the referral. Please let me know if there is any further needs.         Documentation       Carlile, Gaynelle ArabianMorgan Adam, MD Yesterday (12:06 AM)         I attempted to sign the referral to Vascular surgery tonight but a system error prevented this. It was pended as a discharge order for the discharge physician to sign as I signed out the patient to a new ED MD team prior to discharge. Now the system will not allow me to sign the order as we are now after 96 hours post discharge. Please advise how I may help.         Documentation      (prev notes)  Following up on prev notes below. I called pt & LMM for her w/status of her order & as soon as her ER MD signs the order & our NP Wilkie AyeKristy has Triaged I will call her back to get her schd asap per her request. ([place])    (prev notes)  Following up on prev notes below.I rece a reply from [facility] informing the order is not showing since it was not signed. It is in Epic under orders due to this as pending signature fromCarlile, Gaynelle ArabianMorgan Adam, MD. I tried to call the # listed for [phone] [facility]. I have forwarded Epic Staff message to MD to please sign the order ASAP since pt wants to schd. I will reach out to pt later in the a/m & give her a status. Pending reply from MD.    (prev notes)  [name] a call from pt wanting to schd her Vascular Surg Consult ASAP. Per pt she was told  when she went ot the [facility] 7/16 that she had a AAA & needed to be seen asap. I looked & do not reflect that a order was sent over to us. Per pt it [facility] have been [facility] but she does not want to go there. I informed pt I would be able to see the order if it had been placed for [facility] since we schd for both. I informed pt I would contact my Sup & conf how to get a copy of this order since I know the ER MD's can be hard to reach. Per pt her cell was lost recently so she is not avail @ that # asked that I call her back on [phone]. Pending reply & reecipt of order I will have NP Triage as STAT & call pt to schd.

## 2018-04-15 NOTE — ED Notes (Signed)
[facility]   pt ok to go to floor,.

## 2018-04-15 NOTE — ED Notes (Signed)
Pt resting in gurney, even unlabored respirations, calm and cooperative.   Sitter at bedside.     Hazardous objects removed.
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
## 2018-04-15 NOTE — Interdisciplinary (Signed)
Admitted a 62 yo female to rm 719 on a 5150/DTS exp: 7/27 @ 0200, who us currently residing at assisted living.   Pt is uncooperative and angry on admission assessment, however she allowed staff to take her VS and weight.  She was taken  To ED by SDPD after she verbalized to staff that she wanted to jump off the the roof of the building.  Pt is disheveled and unkempt looking, very difficult to do admission assessment. Refused all the admission questionares.  "leave me alone, get out and don't bother me".  Refused to sign all adm paperworks, clsoed her eyes and refused to talk.  1:1 for high suicide risk initiated for safety.

## 2018-04-15 NOTE — ED Floor Report (Signed)
ED to Psychiatry Floor Report    Donnita FallsHelen V Cousineau is a 62 year old female    Triage Chief Complaint:    Chief Complaint   Patient presents with    Suicidal Thinking     5150 BIB ALS/PD for SI - pt reporting SI at assisted living facility and endorses AH telling her the same - wants to jump from roof.  c/o anxiety over new diagnosis of cerebral anuerism.  A&Ox4, NAD on RA, ambulates, calm and directable.         Admitted for:  5150, psychosis     Active Psychiatric Symptoms:  None , sleeping     PIV Removed:  yes  (Reminder: [facility] and [facility] do not accept patients with IVs)    Legal Status: 5150/ DTS    Restrained in the ED: No    Attempt to Elope or AWOL during ED visit:  No    Housing Situation: SNF    Can Patient return to current housing when discharged?  Yes     ADL: independent.    Last Set of Vitals:  BP 139/69    Pulse 55    Temp 97.9 F (36.6 C)    Resp 18    SpO2 97%     BAL Result:    Lab Results   Component Value Date    ETOH <11 07/19/2016     Alcohol Breath Test (POC)    %: 0 % (04/14/18 0406 : Tawni LevyStare, Brian Andrew, RN)    Urine Tox Result:    Lab Results   Component Value Date    BARBCLASS Negative 04/14/2018    BENZYLCGNN Negative 04/14/2018    BENZDIAZCL Negative 04/14/2018    METHADONE Negative 04/14/2018    OPIATESCL Negative 04/14/2018    OXY Negative 04/14/2018    PHENCYCLDN Negative 04/14/2018    TETCANNABIN Negative 04/14/2018    UDI See Comment 04/14/2018       PMH:    Past Medical History:   Diagnosis Date    Amphetamine abuse (CMS-HCC)     Bipolar 1 disorder (CMS-HCC)     Bipolar affective (CMS-HCC)     Fracture, intertrochanteric, left femur (CMS-HCC)     Gastric bypass status for obesity     H/O: hysterectomy     HLD (hyperlipidemia)     HTN (hypertension)     Manic depression (CMS-HCC)     Obesity     Opioid abuse (CMS-HCC)     Schizophrenia (CMS-HCC)        Medications Given:    Medications   OLANZapine (ZYPREXA ZYDIS) disintegrating tablet 5 mg (5 mg Oral Given 04/14/18 0346)      OLANZapine (ZYPREXA ZYDIS) disintegrating tablet 5 mg (5 mg Oral Given 04/14/18 0803)   OLANZapine (ZYPREXA ZYDIS) disintegrating tablet 5 mg (5 mg Oral Given 04/14/18 1905)           Call Allie BossierAimee Alice Vitelli, RN at [phone] for questions.

## 2018-04-15 NOTE — Progress Notes (Signed)
CURES Report:    I performed a CURES report on 04/15/18 noting numerous medications being prescribed by nine different providers at eight different facilities since December 2018:      - 03/29/18 hydrocodone-acetaminophen 10-325 mg #120 tabs by Mardene CelesteShira Klane, NP ([facility])  - 03/29/18 carisoprodol 350 mg #30 tabs by Mardene CelesteShira Klane, NP [facility])  - 03/04/18 pregabalin 150 mg #60 tabs by Windell MomentBernard Lichtenstein, MD ([facility])  - 02/25/18 hydrocodone-acetaminophen 7.5-325 mg #120 tabs by Laurice RecordAlicja Steiner, MD ([facility])  - 02/25/18 carisoprodol 350 mg #30 tabs by Laurice RecordAlicja Steiner, MD ([facility])  - 02/11/18 hydrocodone-acetaminophen 7.5-325 mg #10 tabs by Windell MomentBernard Lichtenstein, MD ([facility])  - 02/03/18 codeine-acetaminophen 30-300 mg #20 tabs by Gaynelle AduGelareh Asadi, DDS ([place]  - 01/13/18 hydrocodone-acetaminophen 7.5-325 mg #50 tabs by Heron NayJose Quinonez, MD ([facility])  - 01/07/18 hydrocodone-acetaminophen 5-325 mg #50 tabs by Ophelia CharterPatrick Michael O'meara, MD ([facility])  - 12/18/17 hydrocodone-acetaminophen 7.5-325 mg #50 tabs by Heron NayJose Quinonez, MD ([facility])  - 12/09/17 hydrocodone-acetaminophen 5-325 mg #14 tabs by Heron NayJose Quinonez, MD ([facility])  - 11/06/17 hydrocodone-acetaminophen 5-325 mg #12 tabs by Birdie RiddleBenjamin Kilian, DO [facility])  - 09/08/17 hydrocodone-acetaminophen 5-325 mg #20 tabs by Carolyne LittlesJon Harrison, MD [facility])  - 09/03/17 hydrocodone-acetaminophen 5-325 mg #5 tabs by Matilde SprangEjaz Ahmed, MD [facility]    Kathleen LimeIan C. Lysle MoralesNeel, MD  Geriatric Medicine Attending  Pager: [phone]

## 2018-04-15 NOTE — ED Notes (Signed)
This writer is finished sitting for this pt. Turnover back to CCP Agnes.

## 2018-04-16 LAB — VITAMIN B12, BLOOD: Vitamin B12: 583 pg/mL (ref 232–1245)

## 2018-04-16 LAB — LIPID(CHOL FRACT) PANEL, BLOOD
Cholesterol: 167 mg/dL (ref <200)
HDL-Cholesterol: 44 mg/dL
LDL-Chol (Calc): 102 mg/dL (ref <160)
Non-HDL Cholesterol: 123 mg/dL
Triglycerides: 107 mg/dL (ref 10–170)

## 2018-04-16 LAB — SYPHILIS EIA SCREEN, BLOOD: Syphilis EIA Screen: NEGATIVE

## 2018-04-16 MED ORDER — LOPERAMIDE HCL 2 MG OR CAPS
2.00 mg | ORAL_CAPSULE | Freq: Four times a day (QID) | ORAL | Status: DC | PRN
Start: 2018-04-16 — End: 2018-04-19
  Administered 2018-04-16: 2 mg via ORAL
  Filled 2018-04-16: qty 1

## 2018-04-16 MED ORDER — DICYCLOMINE HCL 10 MG OR CAPS
20.00 mg | ORAL_CAPSULE | Freq: Three times a day (TID) | ORAL | Status: DC | PRN
Start: 2018-04-16 — End: 2018-04-18
  Administered 2018-04-16 – 2018-04-17 (×2): 20 mg via ORAL
  Filled 2018-04-16 (×2): qty 2

## 2018-04-16 MED ORDER — OLANZAPINE ODT 5 MG OR TBDP
2.50 mg | ORAL_TABLET | Freq: Two times a day (BID) | ORAL | Status: DC
Start: 2018-04-16 — End: 2018-04-17
  Administered 2018-04-16 – 2018-04-17 (×3): 2.5 mg via ORAL
  Filled 2018-04-16 (×3): qty 1

## 2018-04-16 MED ORDER — BUPROPION XL (DAILY) 150 MG OR TB24
150.00 mg | ORAL_TABLET | Freq: Every day | ORAL | Status: DC
Start: 2018-04-17 — End: 2018-04-19
  Administered 2018-04-17 – 2018-04-19 (×3): 150 mg via ORAL
  Filled 2018-04-16 (×3): qty 1

## 2018-04-16 MED ORDER — GABAPENTIN 100 MG OR CAPS
100.00 mg | ORAL_CAPSULE | Freq: Three times a day (TID) | ORAL | Status: DC | PRN
Start: 2018-04-16 — End: 2018-04-19
  Administered 2018-04-16 – 2018-04-19 (×14): 100 mg via ORAL
  Filled 2018-04-16 (×15): qty 1

## 2018-04-16 NOTE — Interdisciplinary (Signed)
Social Work Note:    Case presented in geriatric psychiatry multidisciplinary treatment team rounds this morning.     SW team to continue to follow and to assist with safe discharge planning.      Anylah Scheib, LCSW
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
## 2018-04-16 NOTE — Interdisciplinary (Signed)
[facility] Occupational Therapy Evaluation (Moderate Complexity 97166) x 45 minutes  DOB: 02/04/1956  Referring Physician: Dr. Prudy Feeler  Admission date: 04/14/18  Date of Occupational Therapy Order: 04/15/18    Reason for Referral: Moderate complexity evaluation found at least three functional deficits affecting self care    Functional Assessment: Pt presents to occupational therapy with decreased activity tolerance, emotional regulation, standing balance due to pain, LB muscle strength, and initiation to complete self care tasks safely due to impaired cognition. Pt requires skilled occupational therapy to return to the highest, most independent level of functioning in the least restrictive, most supportive environment.     Skilled Medical necessity required due to pts older age and complex medical history, a spontaneous recovery is not expected.     Medical History: previous closed displaced fracture of left femoral neck, bipolar affective disorder, amphetamine abuse, opioid abuse, previous left femur fracture, HTN, hyperlipidemia, obesity, abdominal lipoma, SI, abnormal liver function, major depressive disorder, fibromyalgia, GERD, Vit D deficiency.     TARGET SYMPTOMS:Agitation, paranoia, poor emotional regulation     Medical Necessity exists: Yes    Medical Diagnosis: Bipolar affective disorder and major depressive disorder  Treating Diagnosis: Z73.6 Limitation in activity due to disability     Prior level of function/living environment: Patient was living alone at [facility], where residents are provided with assistance with IADL. Patient was unable to verbalize in detail how much assistance she was receiving and reported "they help me out there, you already know how much they help me". Patient became easily frustrated when therapist began asking about ADL/IADL and began yelling at therapist and declined to continue answering questions, stating "you think everything is  so funny, go fxxx with someone else" and left the room.     Social History: Patient's daughter, Beckie Busing lives in [place].     Discharge planning/social support/environmental factors: Patient is receiving SSI and has a daughter named Beckie Busing, who lives in [place].     Rehab potential: [place]  Patient displays poor activity tolerance and emotional regulation causing loud outburst with foul language posing limitations to treatment.     Abnormal functional mobility or tone: Due to reported pain, patient prefers WC for propulsion on the unit. Patient is able to ambulate with FWW, though poses a fall risk due to back pain and poor LB strength and coordination.     Functional Performance Deficits:    Activities of Daily Living               instrumental Activities of Daily Living   Dressing: Mod A Shopping: Max A   Eating: MOD I Medication Management: Dependent    Ambulation: SBA Housekeeping:Max A   Sit to stand: SBA Accounting: Dependent    Transfers: Mod I Meal Preparation: Dependent    Toileting: Max A Transportation: Dependent    Hygiene: Mod A Telephone: Mod I   Grooming: Mod A Laundry: Max A   Bathing: Mod A Ability to care for others: unable      Dependent = Pt requires 100% assistance to complete task  Maximum assistance = Pt can complete up to 25% of the task without help   Moderate assistance = Pt can complete up to 50% of the task without help   Minimal assistance = Pt can complete up to 75% of the task without help   Contact Guard assistance = Pt requires occasional physical contact to maintain safety   Stand by assistance = Therapist must  be standing by for safety, to complete task  Supervision/ Set Up = Pt needs verbal cues to complete task, due to safety concerns   Modified independent = Pt can complete task independently with devices or extra time  Independent = Pt does not need any verbal or physical assistance to complete task     Underlying Impairments:    Activity tolerance: Poor Thought process: Impaired       Attention span: Poor Sequencing: Good   Vision: Good Motor Planning: Good   Visuo-spatial: Good Problem Solving: Poor   Sitting Balance: Good Orientation: Alert and Ox1 (self)   Standing Balance: Poor  Affect: agitated    Insight: Poor Standing Tolerance: Poor   Judgement: Poor Fine Motor Coordination:  Good   R/LUE strength:   Refused to test Hearing: Good      Standardized Assessments:   B[place]: 45/100 Get Up and Go: 3 (fall risk)   Allen Cognitive Level:  NT 30 Second Sit to Stand: Refused    MoCA (from chart): Refused  Teepa Snow GEM class: [place]    SLUMS: NT [place] iADL: 7/8 deficits (max deficits)     Inter-Professional Treatment Plan/Discharge Criteria:   - Patient will have no verbalizations of suicide ideations for 48 consecutive hours (or two consecutive days).  - Appropriate aftercare plans will be established based on the patient's level of care needs, cognitive strengths and weaknesses, geographical location, and financial criteria.  - Patient will establish a stable medication regimen; no problem behaviors requiring medications for 48 consecutive hours (or two consecutive days).    Interventions: Group therapy, therapeutic activities, therapeutic exercise, ADL re- training, cognitive development skills    Pt Stated Goal: "I'm done answering questions"    Short Term Occupational Therapy Goals to be achieved in two weeks by : 04/30/18  1.Pt will attend exercise group 1-2 x week to promote LB muscle strength and range of motion.  2. Pt will participate in at least 1 group per day without displaying agitation for 100% of group duration to increase emotional regulation.    Long Term Occupational Therapy Goals to be achieved before discharge:   1.Pt's will identify and be independent in initiating 2 non-pharmacological interventions (I.e., deep breathing) to decrease paranoia associated with hallucinations.  2. Pt will participate in at least 3 group per day without displaying agitation for 75% of group  duration to increase activity tolerance.       Frequency and Duration: Groups 3-5x/week for 15-60 minutes, 1:1 therapy  1-3x/week for 30 minutes     Discharge Recommendation: Patient would benefit from secure facility to ensure proper medication management as well as assist with safe completion of ADL due to impaired physical and cognitive abilities.      TODAY'S ACTIVITIES: Pt participated in constructional fun crafting class with peers and staff. Patient displayed an agitated demeanor when approached. Patient was able to create a design with the assistance of peers next to her. Patient seemed moderately engaged in the activity and was noted yelling at a peer for asking how she was doing. Later the peer and the patient, whom she yelled at, seemed to bond over how they [facility] at [facility].     Constructional Fun (97150) x 45 minutes: motor planning, purposeful task engagement, problem solving, socialization, reminiscence. (Parallel level - Group members listen, work, or play next to each other without interaction.)       7 of note was written by Sharma Covert, Occupational Therapy student (  OTS)  Note has been reviewed and edited for grammatical and clinical assessment purposes by Markus Daft, MOT, OTR/L

## 2018-04-16 NOTE — Progress Notes (Addendum)
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     Target Symptoms: auditory hallucinations, mild disorganization, suicidal ideation     Discharge Criteria:   - Patient will have no verbalizations of suicide ideations for 48 consecutive hours (or two consecutive days).  - Appropriate aftercare plans will be established based on the patient's level of care needs, cognitive strengths and weaknesses, geographical location, and financial criteria.  - Patient will establish a stable medication regimen; no problem behaviors requiring medications for 48 consecutive hours (or two consecutive days).    Subjective:   Patient was seen and evaluated. Electronic medical records were reviewed including medication administration records and flow sheets. Patient was discussed in the interdisciplinary team meeting.     Per nursing report, patient slept 7.25 hours and consumed 80/100/100 percent of meals in the last 24 hours. Last bowel movement was on 7/25. Required olanzapine 2.23m PRN x 5 at 1400, 1456, 1726, 1936, 0154 in the last 24 hours, for agitation and resltessness, with minimal to good effect. Per nursing, patient has been demanding and medication seeking, particularly for buprenorphone and pain medication.     On interview this morning, patient initially apologizes for her outburst yesterday. She says she has been hearing command auditory hallucinations to jump out of window or kill herself for about one month. Unable to say if she is hearing them right now. Then she  says she saw her ex outside her window and jumped out of her window and broke her hand and hip and leg. Appears to get timelines mixed up and cannot distinguish this hospitalization from her prior suicide attempt. She abruptly becomes angry and dismissive, saying "I have to get out of here, I can't talk to you anymore!" and wheels herself away.     Family, Psychiatric, or Social History Updates:   I asked patient to provide any phone numbers she would have for collateral and she refused. I [facility] to verify her prescriptions, she last picked up bupropion 1577mdaily on 04/06/18. Patient states today that she gets her prescription from "random doctors" and does not follow with a regular doctor.     Scheduled Medications:   buPROPion  300 mg Daily    lansoprazole  30 mg BID AC    nicotine  1 patch Daily    polyethylene glycol  17 g BID     PRN Medications:   acetaminophen  650 mg Q4H PRN 650 mg at 04/15/18 1730    bisacodyl  10 mg Daily PRN 10 mg at 04/15/18 1030    OLANZapine  2.5 mg TID PRN 2.5 mg at 04/16/18 0154     Review of Systems: 14 point review of systems was negative, except as above    Objective:   04/15/18  1021 04/15/18  1635 04/15/18  1638 04/16/18  0600   BP: 114/85 115/61 105/78 136/88   Pulse: 60 56 80 54   Resp:  '16 16 18   ' Temp:  97.5 F (36.4 C)  98.1 F (36.7 C)   SpO2:  97% 97% 99%  Pain: Pain Score: 9    New Labs/Studies:     Lab Results   Component Value Date    WBC 7.9 04/14/2018    RBC 4.62 04/14/2018    HGB 12.7 04/14/2018    HCT 39.5 04/14/2018    MCV 85.5 04/14/2018    MCHC 32.2 04/14/2018    RDW 17.2 (H) 04/14/2018    PLT 273 04/14/2018    PLT 348 03/18/2007    MPV 9.2 (L) 04/14/2018     Lab Results   Component Value Date    BUN 17 04/14/2018    CREAT 0.87 04/14/2018    CL 109 (H) 04/14/2018    NA 145 04/14/2018    K 4.5 04/14/2018    [place] 9.6 04/14/2018    TBILI 0.19 04/14/2018    ALB 4.1 04/14/2018    TP 6.8 04/14/2018    AST 22 04/14/2018     ALK 97 04/14/2018    BICARB 24 04/14/2018    ALT 35 (H) 04/14/2018    GLU 144 (H) 04/14/2018     Lab Results   Component Value Date    A1C 5.3 04/15/2018     Lab Results   Component Value Date    TSH 2.53 04/14/2018     Lab Results   Component Value Date    CHOL 158 06/13/2012    HDL 62 06/13/2012    LDLCALC 79 06/13/2012    TRIG 85 06/13/2012     Mental Status Examination:    Appearance: Overweight, who appears stated age.  She is dressed in dark green hospital gown, seated in a wheelchair in living room, calm and quiet upon approach.  Behavior: Uncooperative, dismissive, irritated.  Avoids eye contact.  Motor / abnormal involuntary movements: No psychomotor agitation or retardation noted.  Gait: Uses wheelchair independently, able to use lower extremities to propel herself around the unit  Speech: Normal volume, rate angry tone   Mood: "I can't talk to you anymore!"  Affect: Labile, initially euthymic then irritated and angry   Thought process: coherent, superficially linear, goal-directed  Thought content: No overt psychosis, no paranoia, grandiosity. Denies current suicidal and homicidal ideation, intent and plan.  Perceptions: Does not appear to be responding to internal stimuli but endorses command auditory hallucinations. Does not endorse visual hallucinations this morning.   Insight / judgment: limited / poor  Sensorium / orientation / intellectual functions: Alert and oriented to self. Unable to assess orientation to time, place, situation.     [place] cognitive assessment   Refused on 04/15/18    Neuroimaging  CT STROKE ANGIOGRAPHY HEAD AND NECK   04/07/2018:  1. No evidence of intracranial hemorrhage or mass lesion, mass effect or midline shift, herniation or hydrocephalus, or extra-axial fluid collection. No evidence of acute transcortical infarct. No hyperdense vessel sign.  2. Essentially non-diagnostic CTA head and neck due to premature contrast bolus timing related to extensive venous collaterals  within the neck and upper chest.    MRI BRAIN W/O CONTRAST  04/07/2018:  Study quality degraded by patient motion on most sequences.  No acute intracranial findings. Specifically, no acute infarct.    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    Patient has been coherent, superficially linear, and not responding to internal stimuli while on the unit. While irritable and with poor frustration tolerance, her behavior has been non-agitated and organized, which is marked change from her presentation prior to [facility] admission. She has been receiving olanzapine with some apparent improvement in psychotic features.     While her urine toxicology is negative, substance-induced psychosis remains higher on the differential given her current and prior presentations. She has a prior suicide attempt in 2017, at which time toxicology was positive for methamphetamines. In addition, records from the [facility] report and Care Everywhere suggest active substance use disorder, raising the possibility of secondary gain. Given prior suicide attempt in context of psychosis, she warrants inpatient hospitalization for safety and stabilization of reported psychiatric symptoms as well as diagnostic clarification.     Active Problem List:  Active Hospital Problems    Diagnosis    *Major depressive disorder    Suicidal ideation    Vitamin D deficiency     Chronic abdominal pain    Amphetamine abuse (CMS-HCC)    Opioid abuse (CMS-HCC)    HTN (hypertension)    HLD (hyperlipidemia)       Plan:  A.  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  # Consider malingering    - Change bupropion extended release 366m daily to 1539mdaily to target mood symptoms due to verification of patient's outpatient prescription (lowered on 04/16/18)  - Start olanzapine disintegrating tablet 2.54m87mID for psychosis and mood stabilization (04/16/18)   - Continue olanzapine disintegrating tablet 2.54mg81mD PRN to target agitation or psychosis              - ECG as above     #Opiate use disorder  - Will engage in motivational interview when psychiatrically appropriate  - As per [facility] and pharmacy, no recent prescription for buprenorphine     # Tobacco use disorder  - Nicotine patch 14mg56mhours  - Will engage in motivational interview when psychiatrically appropriate    B.  Medical:    Appreciate recommendations from geriatric psychiatry.    # Chronic abdominalpain, constipation   - Avoid narcotic pain medication  -Continue polyethylene 17 grams BID    # Chroniclow back painwith sciatica of bilateral lower extremities and fibromyalgia:    - Lidocaine patch 5% daily to the low back    # Gastroesphogeal reflux disease:   - Continue lansoprazole 30mg 454my    # Hyperlipidemia:   - Recheck lipid panel (pending), A1c (5.6) for cardiovascular risk stratification    #Vitamin D deficiency:  - Check vitamin D level (pending)    # Possible history of gastricbypass:   - Check vitamin B12 and B1 levels    # DVT Prophylaxis:   - She is ambulatory around [facility]   [facility]  Psychosocial: Group and milieu therapy    D.  Legal: Voluntary as of 04/15/18    E.  Disposition: To home when psychiatrically stable    This patient and her care were discussed with the attending  psychiatrist, Dr. Scotty Pinder Prudy Feeler  Dr. Manuella Ghazi, PGY 2    Geriatric Psychiatry Attending Addendum:    I have seen and evaluated the patient, reviewed the patient's medical record including her medical administration records and her flow sheets. I have participated in the patients interprofesional treatment team meeting this morning. I have read and edited the note written earlier today by the resident physician. In addition to the information in this attestation note, her note as edited by me, accurately summarizes my independent assessment of the patient as well as my contributions to her treatment plans. We appreciate the ongoing input regarding the management of her medical issues which is being provided by the geriatric medicine team. This hospitalization is similar to that of [place] in 2013 where she presented with suicidal ideation, held for several days and released as she reconstituted rather quickly. At this time she does not have a formal thought disorder, appearing restless but may be due to substance withdrawal. Will monitor withdrawal from opiates and treat symptomatically. Start olanzapine for mood stabilization. I will provide hand off to Dr. Ashby Dawes.     Prudy Feeler, DO  Geriatric Psychiatry Attending   12:31 PM  04/16/18

## 2018-04-16 NOTE — Interdisciplinary (Signed)
[place] Activities of Daily Living Index   Activity Score     FEEDING   0 = unable   5 = needs help cutting, spreading butter, etc., or requires modified diet   10 = independent     BATHING   0 = dependent   5 = independent (or in shower)     GROOMING   0 = needs to help with personal care   5 = independent face/hair/teeth/shaving (implements provided)     DRESSING   0 = dependent   5 = needs help but can do about half unaided   10 = independent (including buttons, zips, laces, etc.)     BOWELS   0 = incontinent (or needs to be given enemas)   5 = occasional accident   10 = continent       BLADDER   0 = incontinent, or catheterized and unable to manage alone   5 = occasional accident   10 = continent     TOILET USE   0 = dependent   5 = needs some help, but can do something alone   10 = independent (on and off, dressing, wiping)    TRANSFERS (BED TO CHAIR AND BACK)   0 = unable, no sitting balance   5 = major help (one or two people, physical), can sit   10 = minor help (verbal or physical)   15 = independent     MOBILITY (ON LEVEL SURFACES)   0 = immobile or < 50 yards   5 = wheelchair independent, including corners, > 50 yards   10 = walks with help of one person (verbal or physical) > 50 yards   15 = independent (but may use any aid; for example, stick) > 50 yards     STAIRS   0 = unable   5 = needs help (verbal, physical, carrying aid)   10 = independent      TOTAL (0-100): 45/100  0-20 Total ADL disability  25-45 Severe ADL disability  50-70 Moderate ADL disability  75-95 Slight ADL disability  100 No ADL impairment    At current score, pt would require severe physical assistance for basic activities of daily living upon discharge. This score does not reflect level of cognitive assistance via visual, verbal or tactile cues.       1. The index should be used as a record of what a patient does, not as a record of what a patient could do.  2. The main aim is to establish degree of independence from any help,  physical or verbal, however minor and for whatever reason.  3. The need for supervision renders the patient not independent.  4. A patient's performance should be established using the best available evidence.   5. Usually the patient's performance over the preceding 24-48 hours is important, but occasionally longer periods will be relevant.  6. Middle categories imply that the patient supplies over 50 percent of the effort.  7. Use of aids to be independent is allowed.

## 2018-04-16 NOTE — Plan of Care (Addendum)
Problem: Promotion of Mental Health and Safety  Goal: The pt remains safe, receives appropriate treatment and achieves outcomes (psychologically, psychosocially, physically, and spiritually) within the limitations of the disease process by discharge.  Outcome: Not Progressing  Flowsheets (Taken 04/16/2018 0155)  Guidelines: Inpatient Nursing Guidelines; Mental Health Assessment; Mood disorder; Thought disorder; Behavioral Issues; Schizophrenia  Individualized Interventions/Recommendations #1: Assess pt behavior for signs of acute psychosis and intervene accordingly.  Individualized Interventions/Recommendations #2: Maintain pt safety.  Individualized Interventions/Recommendations #3: Assess and evaluate for pain and discomfort. Medicate as needed.  Individualized Interventions/Recommendations #4: Provide a good sleep hygiene.  Individualized Interventions/Recommendations #5: Encourage pt compliance with treatment plan.  Note:   Received pt in bed asleep. Quiet and calm . She woke up to use the restroom, guarded and minimal in her interaction. C/O of feeling anxious, restless , given Olanzapine 2.5 mg PRn per patient request with good effect. She went back to sleep , had a total of 5.5 sleep hours.

## 2018-04-16 NOTE — Progress Notes (Signed)
Chief Complaint: Management of medical issues.    HPI: Alexandria Proctor is a 62 year old female admitted to [facility].  We have been requested by [facility] to help manage the active medical issues.    The past medical history, surgical history, social history, and family history were reviewed.  I have reviewed the medical record, recent lab data, imaging data, and procedures.  This information has been used to update and contribute to my summary of active and chronic medical issues below.    Medical issues / present illness:    she was discussed on rounds with geropsychiatry on 04/16/18    She apologized to me on 04/16/18 stating she was apologizing to everyone she sees due to feeling she was rude on 04/15/18.  I explained she hadn't been rude to me and no apology was necessary, which she appreciated.  She endorsed abdominal pain and low back pain 8/10 aching, and stated "but it's ok, I want to focus on my mental health since that will help me address these pains later."     Active issues:    1) Psychosis, NOS; depression; history of polysubstance abuse: She presented to the ED at [place] on 04/14/18 for evaluation of suicidal ideation, reporting a desire to jump off a building.  She has a history of prior fall from a 4th floor balcony while intoxicated on methamphetamine.  Psychiatry evaluated her and felt admission was warranted, and hence she was admitted to [facility] on 04/15/18.  - 04/07/18 MRI brain: "Study quality degraded by patient motion on most sequences.  No acute intracranial findings. Specifically, no acute infarct."    2) Essential hypertension: She is not prescribed pharmacotherapy    3) Chronic pain syndrome :She has chronic low back pain and chronic abdominal pain for which she has a history of seeking opiates from numerous different providers around [facility], with no routine care provider.  She was prescribed lidocaine patch 5% daily and tylenol 650 mg q4h PRN pain on admission to [facility]    4) Constipation: She was  prescribed Miralax 17 g BID  - Last BM 04/15/18    5) Hyperlipidemia: She was not prescribed pharmacotherapy.    Past Medical History:  Patient Active Problem List    Diagnosis Date Noted    Suicidal ideation 04/15/2018    Major depressive disorder 04/15/2018    Fibromyalgia 04/15/2018    GERD (gastroesophageal reflux disease) 04/15/2018    Vitamin D deficiency 04/15/2018    Chronic abdominal pain 04/15/2018    Bipolar affective (CMS-HCC)     Amphetamine abuse (CMS-HCC)     Opioid abuse (CMS-HCC)     Fracture, intertrochanteric, left femur (CMS-HCC)     HTN (hypertension)     HLD (hyperlipidemia)     Obesity     Closed displaced fracture of left femoral neck (CMS-HCC) 07/19/2016    Benign neoplasm of colon removed during colonoscopy 06/16/2012    Abnormal liver function test 06/15/2012    Abdominal pain, acute suspected to be due to gastroenteritis 06/14/2012    Abdominal Lipoma 06/14/2012    Suicide ideation cleared by Psychiatry for Discharge 06/14/2012      Past Surgical History:   Procedure Laterality Date    GASTRIC BYPASS      hx gastric bypass      s/p IMN left IT fracture 07/20/16      STOMACH SURGERY      Ventral Hernia repair       Review of  Systems - Constitutional: negative for: night sweats, fever.  CV: negative for:  palpitations, chest pain, lower extremity edema.  Resp: negative for:  cough, sputum, shortness of breath.  GI: abdominal pain, nausea, negative for: vomiting, constipation, diarrhea.  GU: negative for: dysuria, hematuria.  Musculoskeletal: negative for: joint pain, back pain, muscle pain.  Integumentary: negative for: rash, itching, bruising.  Neuro: negative for: paralysis/weakness, numbness or tingling, speech impairment.  Psych: see HPI.    Social History     Tobacco Use    Smoking status: Current Every Day Smoker     Packs/day: 1.00     Types: Cigarettes    Smokeless tobacco: Never Used   Substance Use Topics    Alcohol use: Yes     Alcohol/week: 0.0 oz       Family History   Problem Relation Name Age of Onset    Schizophrenia Mother      Hypertension Mother        Allergies:   Allergies   Allergen Reactions    Compazine Anaphylaxis     Per patient tolerates promethazine/Phenergan    Haldol [Haloperidol] Swelling    Ondansetron Swelling    Reglan [Metoclopramide] Anaphylaxis    Ultram [Tramadol] Swelling    Zofran [Ondansetron] Anaphylaxis    Toradol Other     "my throat closes up and tongue swells up"        Medications - Scheduled:   buPROPion  300 mg Daily    lansoprazole  30 mg BID AC    nicotine  1 patch Daily    OLANZapine  2.5 mg BID    polyethylene glycol  17 g BID       Medications - As needed:   acetaminophen  650 mg Q4H PRN    bisacodyl  10 mg Daily PRN    OLANZapine  2.5 mg TID PRN       Vital Signs: BP 136/88 (BP Location: Left arm, BP Patient Position: Sitting)    Pulse 54    Temp 98.1 F (36.7 C)    Resp 18    Ht '5\' 6"'  (1.676 m)    Wt 94.2 kg (207 lb 9.6 oz)    SpO2 99%    BMI 33.51 kg/m   Vitals:    04/15/18 1021 04/15/18 1635 04/15/18 1638 04/16/18 0600   BP: 114/85 115/61 105/78 136/88   BP Location:  Left arm Left arm Left arm   BP Patient Position: Standing Supine Sitting Sitting   Pulse: 60 56 80 54   Resp:  '16 16 18   ' Temp:  97.5 F (36.4 C)  98.1 F (36.7 C)   SpO2:  97% 97% 99%   Weight:       Height:         Physical Exam: General Appearance: alert, no distress, pleasant affect, cooperative.  Eyes:  conjunctivae and corneas clear. PERRL, EOM's intact.  Nose:  normal.  Mouth: normal.  Neck:  Neck supple. No adenopathy, thyroid symmetric, normal size.  Heart:  normal rate and regular rhythm, no murmurs, clicks, or gallops.  Lungs: clear to auscultation and percussion, no chest deformities noted.  Abdomen: Abdomen soft, tender to light palpation, and expressed tenderness prior to starting palpation. No masses or organomegaly. Bowel sounds normal.  Extremities:  no cyanosis, clubbing, or edema.  Skin:  Skin color, texture,  turgor normal. No rashes or lesions.  Neuro: Gait normal. Reflexes normal and symmetric. Sensation and strength grossly normal.  Musculoskeletal:  neck: normal C-spine, no tenderness, FROM without pain  back:Back symmetric, no curvature. ROM normal. No CVA tenderness.    Lab Results   Component Value Date    WBC 7.9 04/14/2018    RBC 4.62 04/14/2018    HGB 12.7 04/14/2018    HCT 39.5 04/14/2018    MCV 85.5 04/14/2018    MCHC 32.2 04/14/2018    RDW 17.2 (H) 04/14/2018    PLT 273 04/14/2018    PLT 348 03/18/2007    MPV 9.2 (L) 04/14/2018     Lab Results   Component Value Date    BUN 17 04/14/2018    CREAT 0.87 04/14/2018    CL 109 (H) 04/14/2018    NA 145 04/14/2018    K 4.5 04/14/2018    [place] 9.6 04/14/2018    TBILI 0.19 04/14/2018    ALB 4.1 04/14/2018    TP 6.8 04/14/2018    AST 22 04/14/2018    ALK 97 04/14/2018    BICARB 24 04/14/2018    ALT 35 (H) 04/14/2018    GLU 144 (H) 04/14/2018     Lab Results   Component Value Date    A1C 5.3 04/15/2018     Lab Results   Component Value Date    TSH 2.53 04/14/2018     Lab Results   Component Value Date    CHOL 158 06/13/2012    HDL 62 06/13/2012    LDLCALC 79 06/13/2012    TRIG 85 06/13/2012     Lab Results   Component Value Date    VD2 <3 06/13/2012    VD3 25 06/13/2012    VDT 25 (L) 06/13/2012      Assessment/Plan:    1) Essential hypertension: BP is at goal <150/90 without need for pharmacotherapy    2) Chronic low back pain; fibromyalgia: Continue lidocaine patch 5% daily to the low back, tylenol 650 mg q4h PRN pain, defer neuropathic pain agent of choice to psychiatry given the psychotropic nature of these agents.    3) Chronic abdominal pain: Non-specific possible mixed etiology, address constipation as below, avoid opiates    4) Constipation: Continue Miralax 17 g BID    5) GERD: Continue lansoprazole 30 mg daily    6) Hyperlipidemia: She is not on pharmacotherapy.  Awaiting results of lipid panel for risk stratification    7) Vitamin D deficiency: Will start  cholecalciferol 2000 units daily, follow-up pending vitamin D level    8) Possible history of gastric bypass: Follow-up pending labs for micronutrient deficiency evaluation    9) Psychosis, NOS; depression; polysubstance abuse: She remains at [facility] for evaluation and treatment  - Geropsychiatry to manage medications    10) DVT Prophylaxis: She is ambulatory around [place]. Angelica Ran, MD  Geriatric Medicine Attending  Pager: [phone]

## 2018-04-16 NOTE — Plan of Care (Signed)
Problem: Promotion of Mental Health and Safety  Goal: The pt remains safe, receives appropriate treatment and achieves outcomes (psychologically, psychosocially, physically, and spiritually) within the limitations of the disease process by discharge.  Outcome: Not Progressing  Flowsheets  Taken 04/16/2018 1347  Patient /Family stated Goal: "I want her to leave me alone"  Taken 04/16/2018 1511  Guidelines: Inpatient Nursing Guidelines;Mental Health Assessment;Schizophrenia;Suicide Assessment and Precautions  Individualized Interventions/Recommendations #1: Assess mood, SI/HI,AVH.  Administer PRN medication as prescribed.  Individualized Interventions/Recommendations #2: Educate pt about fall prevention. Provide w/c and FWW for safety.  Individualized Interventions/Recommendations #3: Assess for pain and discomfort, provide pharm and non-pharm interventions  Individualized Interventions/Recommendations #4: Assist with ADL's and laundry.  Individualized Interventions/Recommendations #5: Provide active listening and a therapeutic presence.  Taken 04/15/2018 1410  Outcome Evaluation (rationale for progressing/not progessing) every shift: see note below.  Note:   Assisted pt with a shower this AM, which helped her feel better.  She is AOx3-4 and verbalized feeling  frustrated that she is not receiving the medication she was taking outpt.  Pt reported feeling depressed, endorsed CAH to "kill myself, but I'm not going to do it." Medication compliant, attended PM  group briefly.  She was given multiple PRN's for pain and anxiety, that pt said were not effective.  At 0900, warm packs and PRN Tylenol 650 mg were given for 9/10 joint pain in her left shoulder and LBP and olanzapine 2.5 mg for anxiety, per pt both which were not effective.  Of note, pt did note appear to be in distress.  At 13:43 another PRN olanzapine 2.5 mg was given for anxiety per pt request. She was irritable, leg bouncing up and down.  Mildly effective, as  pt was observed smiling and calmly interacting with a peer. At shift end, pt c/o "withdrawal symptoms," which were abdominal pain, nausea, diarrhea, and "my skin is crawling."  Pt sitting in the LR working on a jigsaw puzzle.  Resident notified.

## 2018-04-16 NOTE — Telephone Encounter (Signed)
Following up on prev notes below. I rece reply from NP's Triage below. I see pt is now admitted bed 719. I called pts cell [phone] per her prev req to let her know we can now schd Vascular Surg Consult in [facility] w/[place], [place], [place]. If pt does end up wanting [facility] she can also schd w/ Dr. Janan Ridge. I had to LMM. If pt calls back she is ok to schd.  1st ATTEMPT      Eston Esters, NP  You 14 minutes ago (9:02 AM)      Ok to be seen for AAA    Routing comment      (prev notes)  Following up on prev notes below. I rece a call from the call center informing pt was calling to schd. I requested they inform pt that the ER MD has now signed the order but our NP still needs to finish her Triage of order 1st before we can schd. Per rep they will let her know.    (prev notes)  Following up on prev notes below. I rece reply below & can now see referral now & see it is listed as the wrong dept MON I have updated to [facility] & sent request to NP as STAT to Triage. Once done I will call pt back to get her schd in [place] per her request.    I found the option to sign the referral. Please let me know if there is any further needs.         Documentation       Carlile, Gaynelle Arabian, MD Yesterday (12:06 AM)         I attempted to sign the referral to Vascular surgery tonight but a system error prevented this. It was pended as a discharge order for the discharge physician to sign as I signed out the patient to a new ED MD team prior to discharge. Now the system will not allow me to sign the order as we are now after 96 hours post discharge. Please advise how I may help.         Documentation      (prev notes)  Following up on prev notes below. I called pt & LMM for her w/status of her order & as soon as her ER MD signs the order & our NP Wilkie Aye has Triaged I will call her back to get her schd asap per her request. (pt prefers [employer])    (prev notes)  Following up on prev notes below.I rece a reply from [facility] informing  the order is not showing since it was not signed. It is in Epic under orders due to this as pending signature fromCarlile, Gaynelle Arabian, MD. I tried to call the # listed for [phone] had to [facility]. I have forwarded Epic Staff message to MD to please sign the order ASAP since pt wants to schd. I will reach out to pt later in the a/m & give her a status. Pending reply from MD.    (prev notes)  [name] a call from pt wanting to schd her Vascular Surg Consult ASAP. Per pt she was told when she went ot the [facility] 7/16 that she had a AAA & needed to be seen asap. I looked & do not reflect that a order was sent over to [place]. Per pt it may have been sent to [facility] but she does not want to go there. I informed pt I would  be able to see the order if it had been [facility] since we schd for both. I informed pt I would contact my Sup & conf how to get a copy of this order since I know the ER MD's can be hard to reach. Per pt her cell was lost recently so she is not avail @ that # asked that I call her back on [phone]. Pending reply & reecipt of order I will have NP Triage as STAT & call pt to schd.

## 2018-04-16 NOTE — Plan of Care (Signed)
Problem: Promotion of Mental Health and Safety  Goal: The pt remains safe, receives appropriate treatment and achieves outcomes (psychologically, psychosocially, physically, and spiritually) within the limitations of the disease process by discharge.  Outcome: Not Progressing  Flowsheets (Taken 04/16/2018 2102)  Guidelines: Mood disorder  Individualized Interventions/Recommendations #1: Assess mood and thought process.  Individualized Interventions/Recommendations #2: Educate patient on perscribed medication  Individualized Interventions/Recommendations #3: Monitory of falls safety.   Patient received piecing a puzzle together in the living room.  Became anxious asking for medications that had recently been prescribed.  Patient having difficulties explaining what she needed and why.  Frustrated and yelling at Emerson Electricwriter.  After finally understanding what the patient needed and consulting her MAR, writer administered PRNs at 1645 of gabapentin 100 mg po, imodium for diarrhea, bentyl 20 mg for abdominal cramping and finally prn zydis 2.5 mg for agitation.  The gabapentin and zydis was very effective as she was later apologetic for  her behaviors.  For the rest of the shift, she was calm and pleasant.  Compliant with meds and cooperative with care.

## 2018-04-17 DIAGNOSIS — F1190 Opioid use, unspecified, uncomplicated: Secondary | ICD-10-CM

## 2018-04-17 DIAGNOSIS — Z720 Tobacco use: Secondary | ICD-10-CM

## 2018-04-17 DIAGNOSIS — F39 Unspecified mood [affective] disorder: Secondary | ICD-10-CM

## 2018-04-17 MED ORDER — OLANZAPINE ODT 5 MG OR TBDP
5.00 mg | ORAL_TABLET | Freq: Two times a day (BID) | ORAL | Status: DC
Start: 2018-04-17 — End: 2018-04-18
  Administered 2018-04-17 – 2018-04-18 (×2): 5 mg via ORAL
  Filled 2018-04-17: qty 1

## 2018-04-17 MED ORDER — OXCARBAZEPINE 300 MG OR TABS
300.00 mg | ORAL_TABLET | Freq: Two times a day (BID) | ORAL | Status: DC
Start: 2018-04-17 — End: 2018-04-18
  Administered 2018-04-17: 300 mg via ORAL
  Filled 2018-04-17 (×2): qty 1

## 2018-04-17 MED ORDER — DICLOFENAC SODIUM 1 % EX GEL
4.00 g | Freq: Four times a day (QID) | TRANSDERMAL | Status: DC | PRN
Start: 2018-04-17 — End: 2018-04-17

## 2018-04-17 MED ORDER — LIDOCAINE 5 % EX PTCH
1.00 | MEDICATED_PATCH | CUTANEOUS | Status: DC
Start: 2018-04-17 — End: 2018-04-20
  Administered 2018-04-17 – 2018-04-20 (×4): 1 via TRANSDERMAL
  Filled 2018-04-17 (×4): qty 1

## 2018-04-17 NOTE — Progress Notes (Addendum)
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     Target Symptoms: auditory hallucinations, mild disorganization, suicidal ideation     Discharge Criteria:   - Patient will have no verbalizations of suicide ideations for 48 consecutive hours (or two consecutive days).  - Appropriate aftercare plans will be established based on the patient's level of care needs, cognitive strengths and weaknesses, geographical location, and financial criteria.  - Patient will establish a stable medication regimen; no problem behaviors requiring medications for 48 consecutive hours (or two consecutive days).    Subjective:   Per signout from Dr. Anastasio Champion, pt admitted for threatening self injury and worsening pain.  Pt with hx of polysubstance abuse and chronic pain.  Pt med seeking, demanding meds from staff.  Pt became belligerent with staff, yelling at them.  Pt focussed on getting lyrica.  Pt c/o poor sleep and depression.  Pt required PRN olanzapine for agitation this AM.       Family, Psychiatric, or Social History Updates:   None     Scheduled Medications:   buPROPion  150 mg Daily    lansoprazole  30 mg BID AC    lidocaine  1 patch Q24H    nicotine  1 patch Daily    OLANZapine  5 mg BID    Oxcarbazepine  300 mg BID     PRN Medications:   acetaminophen  650 mg Q4H PRN 650 mg at 04/16/18 0854    bisacodyl  10 mg Daily PRN 10 mg at 04/15/18 1030    diclofenac  4 g 4x Daily PRN      dicyclomine  20 mg Q8H PRN 20 mg at 04/16/18 1652    gabapentin  100 mg TID PRN  100 mg at 04/17/18 0920    loperamide  2 mg 4x Daily PRN 2 mg at 04/16/18 1643    OLANZapine  2.5 mg TID PRN 2.5 mg at 04/17/18 0112     Review of Systems: 14 point review of systems was negative, except as above    Objective:   04/16/18  1035 04/16/18  1530 04/17/18  1005 04/17/18  1006   BP: 98/72 118/68 123/75 110/80   Pulse: 63 74 69 83   Resp: 16 19 18     Temp: 98.4 F (36.9 C) 96.7 F (35.9 C) 98.2 F (36.8 C)    SpO2: 97% 99% 99%      Pain: Pain Score: 0    New Labs/Studies:   None within last 24 hrs    Lab Results   Component Value Date    A1C 5.3 04/15/2018     Lab Results   Component Value Date    TSH 2.53 04/14/2018     Lab Results   Component Value Date    CHOL 167 04/15/2018    HDL 44 04/15/2018    LDLCALC 102 04/15/2018    TRIG 107 04/15/2018     Mental Status Examination:    Appearance: Overweight, who appears stated age.  limited grooming  Behavior: agitated, yelling at staff  Motor / abnormal involuntary movements: None noted  Gait: Uses wheelchair independently, able to use lower extremities to propel herself around the unit  Speech: loud yelling   Mood: "I'm in pain"  Affect: Labile, dysphoric  Thought process: tangential  Thought content: No overt psychosis, no paranoia, grandiosity. Denies current suicidal and homicidal ideation, intent and plan.  Focussed on meds and pain  Perceptions: Does not appear to be responding to internal stimuli but endorses command auditory hallucinations. Does not endorse visual hallucinations this morning.   Insight / judgment: limited / poor  Sensorium / orientation / intellectual functions: Alert and oriented to self. Unable to assess orientation to time, place, situation.     [place] cognitive assessment   Refused on 04/15/18    Neuroimaging  CT STROKE ANGIOGRAPHY HEAD AND NECK   04/07/2018:  1. No evidence of intracranial hemorrhage or mass lesion, mass effect or midline shift, herniation or hydrocephalus, or extra-axial fluid collection. No evidence of  acute transcortical infarct. No hyperdense vessel sign.  2. Essentially non-diagnostic CTA head and neck due to premature contrast bolus timing related to extensive venous collaterals within the neck and upper chest.    MRI BRAIN W/O CONTRAST  04/07/2018:  Study quality degraded by patient motion on most sequences.  No acute intracranial findings. Specifically, no acute infarct.    ECG  Date: 04/14/2018  QTc: 440    [place] Functional Assessment Scale:  Most Recent Total Score: 21  Last 24 Hours Total Score  Avg: 19.2  Min: 13  Max: 24    Narrative Assessment:   Alexandria Proctor is a89 year old single, unemployed, domiciled EcologistAfrican Americanfemale with a past medical history of fibromyalgia, chronic pain, hypertension, hyperlipidemia and a past psychiatric history of unspecified mood disorder, opiate use disorder, amphetamine use disorder, prior supected malingering who was brought in by police on 5150 for danger to self (7/24/19at 0200) for threatening to jump off balcony and [facility] for psychosis.    Pt with labile mood, easily upset, med seeking behavior with staff.  Pt yelling at staff, requires PRN for agitation.  Pt requires inpt level of care.  Cont with plans as per below:    Active Problem List:  Active Hospital Problems    Diagnosis    *Major depressive disorder    Suicidal ideation    Vitamin D deficiency    Chronic abdominal pain    Amphetamine abuse (CMS-HCC)    Opioid abuse (CMS-HCC)    HTN (hypertension)    HLD (hyperlipidemia)       Plan:  A.  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  150 mg daily, d/c 7/31  - Increase olanzapine disintegrating tablet 5mg  BID for psychosis and mood stabilization (04/16/18)   - Continue olanzapine disintegrating tablet 2.5mg  TID PRN  to target agitation or psychosis              - ECG as above   -Add trileptal 300 mg BID for mood/pain.    #Opiate use disorder  - Will engage in motivational interview when psychiatrically appropriate  - As per [facility] and pharmacy, no recent prescription for buprenorphine     # Tobacco use disorder  - Nicotine patch 14mg /24 hours  - Will engage in motivational interview when psychiatrically appropriate    B.  Medical:    Appreciate recommendations  from geriatric psychiatry.    # Chronic abdominalpain, constipation   - Avoid narcotic pain medication  -Continue polyethylene 17 grams BID  Will order pain c/s,. T/c methadone maintenance vs suboxone for pain/opiate dep    # Chroniclow back painwith sciatica of bilateral lower extremities and fibromyalgia:    - Lidocaine patch 5% daily to the low back      # Gastroesphogeal reflux disease:   - Continue lansoprazole 30mg  daily    # Hyperlipidemia:   - Recheck lipid panel (pending), A1c (5.6) for cardiovascular risk stratification    #Vitamin D deficiency:  - Check vitamin D level (pending)    # Possible history of gastricbypass:   - Check vitamin B12 and B1 levels    # DVT Prophylaxis:   - She is ambulatory around [facility]    C.  Psychosocial: Group and milieu therapy    D.  Legal: Voluntary as of 04/15/18    E.  Disposition: To home when psychiatrically stable

## 2018-04-17 NOTE — Interdisciplinary (Signed)
Senior Behavioral Health Occupational Therapy Daily Note     Community meeting ([id number]) x 30 minutes  (Associative level - Group members have brief verbal or nonverbal interactions.)  Pt was alert, and interactive in group    Animal Trivia (97150) x 35 minutes:  purposeful engagement, problem solving, socialization, reminiscence. (Associative level - Group members have brief verbal or nonverbal interactions.)    Positive Affirmations Activity (97150) x 45 minutes: motor planning, purposeful task engagement, problem solving, socialization, reminiscence. (Parallel level - Group members listen, work, or play next to each other without interaction.)     Dressing: Mod A  Eating: Mod I  Ambulation: SBA  Sit to stand: SBA  Transfers: Mod I  Toileting: Max A  Hygiene: Mod A  Grooming: Mod A  Bathing:Mod A    Abnormal functional mobility or tone: Due to reported pain, patient prefers WC for propulsion on the unit. Patient is able to ambulate with FWW, though poses a fall risk due to back pain and poor LB strength and coordination.     Based on Teepa Snows GEMSTM Brain Change model, pt is currently functioning as a(n): Amber- All about sensation: seeks to satisfy desires and tries to avoid what is disliked. In the [facility]. Environment can drive actions and reactions, without safety awareness. Help meet sensory needs. Visual abilities are limited. Physically intrusive. Possible low sensory tolerance. Busy fingers. Things in mouth. Fiddling & messing. Exploring their world. Has periods of intense activity; may be very curious or repetitive with objects or actions. Time alone/with others. Quiet space/Busy space. Upset during personal care tasks. Use Hand under Hand and resting hand approach. Back off & re approach when pt is agitated. Care is refused or seen as threatening due to differences in perspective and ability.     Assessment of Target Symptoms: Patient was actively engaged in community meeting and animal trivia,  providing maximal feedback with minimal prompting. Patient seemed alert and displayed an internal motivation to participate in group meetings compared to the pervious day, where the patient was closed off and seemed increasingly irritated with peers and staff.   Patient completed 2 positive affirmations with limited VCs for assistance with directions. Patient was able to provide therapist with numerous songs of interest to listen to while participating in the activity. During the session, patient spoke to the therapist privately to apologize about her behavior yesterday, displaying improved emotional insight. Patient reported "I'm sorry about yesterday and yelling at you, I need my medication increased because I can't control my outbursts".       Discharge Recommendation/Inter- Professional Treatment Plan: Patient would benefit from secure facility to ensure proper medication management as well as assist with safe completion of ADLs due to impaired physical and cognitive abilities.

## 2018-04-17 NOTE — Plan of Care (Addendum)
Problem: Promotion of Mental Health and Safety  Goal: The pt remains safe, receives appropriate treatment and achieves outcomes (psychologically, psychosocially, physically, and spiritually) within the limitations of the disease process by discharge.  Outcome: Not Progressing  Flowsheets (Taken 04/17/2018 0329)  Guidelines: Inpatient Nursing Guidelines; Mental Health Assessment; Thought disorder; Psychosis; Mood disorder  Individualized Interventions/Recommendations #1: promote a good sleep hygiene.  Individualized Interventions/Recommendations #2: assess for mood and behavior changes  Individualized Interventions/Recommendations #3: Maintain safety from fall and injury.  Individualized Interventions/Recommendations #4: Monitor for pain and discomfort and medicate as needed.  Individualized Interventions/Recommendations #5: provide positive reinforcement for appropriate behavior and compliance with treatment.  Note:   Pt was in her room, sound asleep in bed. After almost an hour, she woke up, unable to sleep. She wheeled herself in the day room, asking for her PRN med. Given Olanzapine PRN but with no effect. She was still up, restless and irritable. Asking staff to [facility] the [facility] in her room, was outrage when the staff told her  she could not lower the  thermostat cause there's a note to not to touch it. She stayed up in her wheelchair by the day room browsing books. At around 0430, she went back to her bed and went to sleep. Had 1.75 hours of sleep. Denies pain and discomfort. No A/VH. Not responding to internal stimuli.

## 2018-04-17 NOTE — Plan of Care (Addendum)
Problem: Promotion of Mental Health and Safety  Goal: The pt remains safe, receives appropriate treatment and achieves outcomes (psychologically, psychosocially, physically, and spiritually) within the limitations of the disease process by discharge.  Outcome: Not Progressing  Flowsheets  Taken 04/17/2018 1438 by Bethena MidgetSantos, Unknown Flannigan, RN  Outcome Evaluation (rationale for progressing/not progessing) every shift: Pleasant,friendly,cooperative post PRN of Gabapentin 100mg  at around 0920 for being anxious.Attended groups after,brighter, engaging  in conversations with peers as well as actively participating in groups.She was well groomed and appropriately dressed and was independent in most areas of her ADL'S.But during lunch,she started to escalate again,complaining about her food"This chicken is too tough for me.I want some pizza".She stated that she can't chew it well because of some missing tooth.When told that there's no available pizza in the kitchen and offered alternates,the more she got upset and had an angry outburst.She then requested for her PRN'S but when it was offered,she got angry,screaming,cursing telling staff to leave her alone when she saw that the Zydis was only half "You screwed me up",to the medicine nurse and walked away.But few minutes after she approached another staff and requested for the same medications but this time wanted 2 of her PRN'S which was Gabapentin and  Zydis,stating, that Rosetta PosnerGaba is not enough because she was so agitated at that moment.PRN was helpful after an hr.Calmer and more directable after.No further irritability nor angry outburst noted.  Taken 04/17/2018 0329 by Elam CityAquino, Amelita Dela Pena, RN  Individualized Interventions/Recommendations #2: assess for mood and behavior changes  Individualized Interventions/Recommendations #3: Maintain safety from fall and injury.  Individualized Interventions/Recommendations #4: Monitor for pain and discomfort and medicate as  needed.  Individualized Interventions/Recommendations #5: provide positive reinforcement for appropriate behavior and compliance with treatment.

## 2018-04-18 MED ORDER — GABAPENTIN 300 MG OR CAPS
300.00 mg | ORAL_CAPSULE | Freq: Three times a day (TID) | ORAL | Status: DC
Start: 2018-04-18 — End: 2018-04-20
  Administered 2018-04-18 – 2018-04-20 (×6): 300 mg via ORAL
  Filled 2018-04-18 (×6): qty 1

## 2018-04-18 MED ORDER — OLANZAPINE ODT 5 MG OR TBDP
7.50 mg | ORAL_TABLET | Freq: Two times a day (BID) | ORAL | Status: DC
Start: 2018-04-18 — End: 2018-04-19
  Administered 2018-04-18 – 2018-04-19 (×2): 7.5 mg via ORAL
  Filled 2018-04-18 (×3): qty 2

## 2018-04-18 NOTE — Plan of Care (Signed)
Problem: Promotion of Mental Health and Safety  Goal: The pt remains safe, receives appropriate treatment and achieves outcomes (psychologically, psychosocially, physically, and spiritually) within the limitations of the disease process by discharge.  Outcome: Not Progressing  Flowsheets  Taken 04/18/2018 0928 by Anice Paganini'Neill, Trace Wirick, RN  Guidelines: Inpatient Nursing Guidelines;Mental Health Assessment;Psychosis;Behavioral Issues  Taken 04/18/2018 0130 by [place] CityAquino, Amelita Dela Pena, RN  Individualized Interventions/Recommendations #2: Educate on fall precautions.  Individualized Interventions/Recommendations #3: Monitor behavior for anxiety,agitation ad restlessness.  Taken 04/17/2018 0329 by [place] CityAquino, Amelita Dela Pena, RN  Individualized Interventions/Recommendations #4: Monitor for pain and discomfort and medicate as needed.  Note:   Pt Argumentative and hostile with care.  Accused staff of taking the watermelon off of her tray and putting it in the fridge. Broken glass found in the shower after her using it.  Contraband check negative, no contraband found. Asked to turn the I-pad down.  The pt became hostile, aggressive and argumentative.  Accused the RN of harassing her.  Explain that it is disruptive to the other pts.  The pt continued to be loud and aggressive then took a nap in her chair. High fall risk and poor safety awareness. Able to get around with the wheel chair without risk of falling or falling.

## 2018-04-18 NOTE — Plan of Care (Addendum)
Problem: Promotion of Mental Health and Safety  Goal: The pt remains safe, receives appropriate treatment and achieves outcomes (psychologically, psychosocially, physically, and spiritually) within the limitations of the disease process by discharge.  Outcome: Not Progressing  Flowsheets  Taken 04/18/2018 0130  Guidelines: Inpatient Nursing Guidelines;Mental Health Assessment;Mood disorder;Thought disorder;Schizophrenia  Individualized Interventions/Recommendations #1: promote a good sleep hygiene  Individualized Interventions/Recommendations #2: Educate on fall precautions.  Individualized Interventions/Recommendations #3: Monitor behavior for anxiety,agitation ad restlessness.  Taken 04/17/2018 0329  Individualized Interventions/Recommendations #4: Monitor for pain and discomfort and medicate as needed.  Individualized Interventions/Recommendations #5: provide positive reinforcement for appropriate behavior and compliance with treatment.  Note:   Received pt sleeping in her room. At midnight, she woke up, wheeled herself in the nursing station,c/o feeling anxious and difficulty falling back to sleep. Requesting staff for her Gabapentin and ZYdis. Pt was pacing with her wheelchair in the hallway,asking for snacks and a hot tea.Gabapentin  100 mg and Zydis 2.5 mg PRN was given. Pt requested to  take the whole pill and not the half. Pt was reminded that we have to follow the doctor's order and not whatever she wants, which she did complied. Pt was reminded that if she wants her dose change, she can talk with the  doctor about it so she can be evaluated. Pt accepted and took the medicine. As she woke up this morning she was right in the nursing station and was asking her medication , complaining of anxiety, Gabapentin 100 mg PRn was given  At  0540 and was asking for her Olanzapine. " I need my medicine and Olanzapine is the one that works for me." When asked if she have A/VH. Patient stated, " Yes I do hear voices of  a man telling me to kill myself. " . "I saw a man standing by the window before I sleep last night and awhen I wake up this morning." Olanzapine 2.5 mg was given at 0541 with both good result. She calmed down and was reading a book by the day room. Slept 5 hours the whole night.

## 2018-04-18 NOTE — Plan of Care (Signed)
Problem: Promotion of Mental Health and Safety  Goal: The pt remains safe, receives appropriate treatment and achieves outcomes (psychologically, psychosocially, physically, and spiritually) within the limitations of the disease process by discharge.  Outcome: Not Progressing  Flowsheets (Taken 04/17/2018 2359)  Guidelines: Mood disorder  Individualized Interventions/Recommendations #1: Encourage milieu activities.  Individualized Interventions/Recommendations #2: Utilize distraction techniques to combat anxiety.  Individualized Interventions/Recommendations #3: Keep patient safe from falls.   Patient received lying awake in her room.  Patient has been somewhat labile.  Becomes overly demanding when needs are not immediately met.  Complaints of anxiety and abdominal cramping.  Given prn gabapentin 100 mg at 1608 for anxiety which was not effective  Given prn zydis 2.5 mg and 20 mg of bentyl for irritability and and abdominal cramping which was effective.  When she is anxious, she is observed to be rocking back and forth while seated on the living room couch.  She does make good attempts at being social with her peers.  Behaviors appear better during the evening hours vs what is charted during the day shift.

## 2018-04-18 NOTE — Interdisciplinary (Signed)
Patient received Neurontin 100 mg p.o.at 10:45  and Zyprexa 2.5 mg p.o. at 10:48 for anxiety and agitation. She was angry and argumentative with staff, demanding for PRN.  Both PRN were effective.

## 2018-04-18 NOTE — Progress Notes (Signed)
GERIATRIC PSYCHIATRY PROGRESS NOTE    ID: Alexandria Proctor is [id number] year old single, unemployed, domiciled EcologistAfrican Americanfemale with a past medical history of fibromyalgia, chronic pain, hypertension, hyperlipidemia and a past psychiatric history of unspecified mood disorder, opiate use disorder, amphetamine use disorder, prior supected malingering who was brought in by police on 5150 for danger to self (7/24/19at 0200) for threatening to jump off balcony and admitted [facility] 04/14/2018 for psychosis.     Target Symptoms: auditory hallucinations, mild disorganization, suicidal ideation     Discharge Criteria:   - Patient will have no verbalizations of suicide ideations for 48 consecutive hours (or two consecutive days).  - Appropriate aftercare plans will be established based on the patient's level of care needs, cognitive strengths and weaknesses, geographical location, and financial criteria.  - Patient will establish a stable medication regimen; no problem behaviors requiring medications for 48 consecutive hours (or two consecutive days).    Subjective:   Pt seen in her room.  Pt c/o pain, med seeking.  Pt says she did not like how trileptal made her feel- gave her  HA and dry mouth.  Pt says she will not take trileptal again.  Pt asking about increase in zyprexa says it helps her mood.   Pt required multiple PRN's for agitation/pain.      Family, Psychiatric, or Social History Updates:   None     Scheduled Medications:   buPROPion  150 mg Daily    gabapentin  300 mg TID    lansoprazole  30 mg BID AC    lidocaine  1 patch Q24H    nicotine  1 patch Daily    OLANZapine  7.5 mg BID     PRN Medications:   acetaminophen  650 mg Q4H PRN 650 mg at 04/16/18 0854    bisacodyl  10 mg Daily PRN 10 mg at 04/15/18 1030    gabapentin  100 mg TID PRN 100 mg at 04/18/18 1045    loperamide  2 mg 4x Daily PRN 2 mg at 04/16/18 1643    OLANZapine  2.5 mg TID PRN 2.5 mg at 04/18/18 1048     Review of Systems: 14 point  review of systems was negative, except as above    Objective:   04/17/18  1621 04/18/18  0513 04/18/18  1010 04/18/18  1011   BP: 108/77 152/81 110/77 96/61   Pulse: 79 57 92 104   Resp:  16 16    Temp:  97.4 F (36.3 C) 98.2 F (36.8 C)    SpO2:  97% 96%      Pain: Pain Score: 0    New Labs/Studies:   None within last 24 hrs    Lab Results   Component Value Date    A1C 5.3 04/15/2018     Lab Results   Component Value Date    TSH 2.53 04/14/2018     Lab Results   Component Value Date    CHOL 167 04/15/2018    HDL 44 04/15/2018    LDLCALC 102 04/15/2018    TRIG 107 04/15/2018     Mental Status Examination:    Appearance: Overweight, who appears stated age.  limited grooming  Behavior: restless, demanding of staff  Motor / abnormal involuntary movements: None noted  Gait: Uses wheelchair independently, able to use lower extremities to propel herself around the unit  Speech: loud yelling   Mood: "I'm in pain"  Affect: Labile, dysphoric  Thought process: tangential  Thought content: No overt psychosis, no paranoia, grandiosity. Denies current suicidal and homicidal ideation, intent and plan.  Focussed on meds and pain  Perceptions: Does not appear to be responding to internal stimuli but endorses command auditory hallucinations. Does not endorse visual hallucinations this morning.   Insight / judgment: limited / poor  Sensorium / orientation / intellectual functions: Alert and oriented to self. Unable to assess orientation to time, place, situation.     Montreal cognitive assessment   Refused on 04/15/18    Neuroimaging  CT STROKE ANGIOGRAPHY HEAD AND NECK   04/07/2018:  1. No evidence of intracranial hemorrhage or mass lesion, mass effect or midline shift, herniation or hydrocephalus, or extra-axial fluid collection. No evidence of acute transcortical infarct. No hyperdense vessel sign.  2. Essentially non-diagnostic CTA head and neck due to premature contrast bolus timing related to extensive venous collaterals within  the neck and upper chest.    MRI BRAIN W/O CONTRAST  04/07/2018:  Study quality degraded by patient motion on most sequences.  No acute intracranial findings. Specifically, no acute infarct.    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    Pt with labile mood, easily upset, med seeking behavior with staff.  Pt yelling at staff, requires PRN for agitation.  Pt refusing trilpetal.   Pt requires inpt level of care.  Cont with plans as per below:    Active Problem List:  Active Hospital Problems    Diagnosis    *Major depressive disorder    Suicidal ideation    Vitamin D deficiency    Chronic abdominal pain    Amphetamine abuse (CMS-HCC)    Opioid abuse (CMS-HCC)    HTN (hypertension)    HLD (hyperlipidemia)       Plan:  A.  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  150 mg daily, d/c 7/31  - Increase olanzapine disintegrating tablet 7.5mg  BID for psychosis and mood stabilization (04/16/18)   - Continue olanzapine disintegrating tablet 2.5mg  TID PRN to target agitation or psychosis              - ECG as above   -d/c trileptal 300 mg BID for mood/pain as pt is refusing due to c/o side-effects.    -Add  gabapentin 300 mg TID for pain/anxiety  Will order pain c/s,. T/c methadone maintenance vs suboxone for pain/opiate dep    #Opiate use disorder  - Will engage in motivational interview when psychiatrically appropriate  - As per [facility] and pharmacy, no recent prescription for buprenorphine     # Tobacco use disorder  - Nicotine patch 14mg /24 hours  - Will engage in motivational interview when psychiatrically appropriate    B.  Medical:  Appreciate recommendations from geriatric psychiatry.    # Chronic abdominalpain, constipation   - Avoid narcotic pain medication  -Continue polyethylene 17 grams BID      # Chroniclow back painwith sciatica of bilateral lower extremities and fibromyalgia:    - Lidocaine patch 5% daily to the low back      # Gastroesphogeal reflux disease:   - Continue lansoprazole 30mg  daily    # Hyperlipidemia:   - Recheck lipid panel (pending), A1c (5.6) for cardiovascular risk stratification    #Vitamin D deficiency:  - Check vitamin D level (pending)    # Possible history of gastricbypass:   - Check vitamin B12 and B1 levels    # DVT Prophylaxis:   - She is ambulatory around [facility]    C.  Psychosocial: Group and milieu therapy    D.  Legal: Voluntary as of 04/15/18    E.  Disposition: To home when psychiatrically stable

## 2018-04-18 NOTE — Plan of Care (Signed)
Problem: Promotion of Mental Health and Safety  Goal: The pt remains safe, receives appropriate treatment and achieves outcomes (psychologically, psychosocially, physically, and spiritually) within the limitations of the disease process by discharge.  Outcome: Not Progressing  Flowsheets (Taken 04/18/2018 2232)  Guidelines: Thought disorder  Individualized Interventions/Recommendations #1: Educate patient on her current medications.  Individualized Interventions/Recommendations #2: Encourage milieu activities.  Individualized Interventions/Recommendations #3: Monitor for safety   Patient has been very labile.  Numerous angry outbursts regarding her medications.  Claiming that Clinical research associatewriter is deliberately withholding her medications.  Cursing and making derogatory commits about Clinical research associatewriter.  Hinting that writer's actions are racist in nature.  Writer explained the scheduling of her medications (Zydis 7.5 mg/gabapentin 300 mg) and showed her the actual order.  She did apologies for her outbursts.  She required one on one interventions and redirections.  She has difficulties with logic and rationality.  Easily derails despite being pleasant and laughing with peers and staff.    Based on patient's labile behaviors the following prns were administered:    1701:  Zydis 2.5 mg po for yelling and cursing with poor effect or short lived at best.    [id number] and 2014: gabapentin 100 mg for anxiety AEB rocking back and forth in her chair and pulling on her hair with poor effect.    1848:  Tylenol 650 mg po for complaints of general body pain 7/10 with effective results.

## 2018-04-19 LAB — COMPREHENSIVE METABOLIC PANEL, BLOOD
ALT (SGPT): 30 U/L (ref 0–33)
AST (SGOT): 18 U/L (ref 0–32)
Albumin: 4.2 g/dL (ref 3.5–5.2)
Alkaline Phos: 96 U/L (ref 35–140)
Anion Gap: 13 mmol/L (ref 7–15)
BUN: 19 mg/dL (ref 8–23)
Bicarbonate: 26 mmol/L (ref 22–29)
Bilirubin, Tot: 0.26 mg/dL (ref <1.2)
Calcium: 9.9 mg/dL (ref 8.5–10.6)
Chloride: 106 mmol/L (ref 98–107)
Creatinine: 0.81 mg/dL (ref 0.51–0.95)
GFR: >60 mL/min
Glucose: 91 mg/dL (ref 70–99)
Potassium: 4.4 mmol/L (ref 3.5–5.1)
Sodium: 145 mmol/L (ref 136–145)
Total Protein: 7 g/dL (ref 6.0–8.0)

## 2018-04-19 LAB — CBC WITH DIFF, BLOOD
ANC-Automated: 5.2 10*3/uL (ref 1.6–7.0)
Abs Basophils: 0 10*3/uL (ref <0.1)
Abs Eosinophils: 0.2 10*3/uL (ref 0.1–0.5)
Abs Lymphs: 1.3 10*3/uL (ref 0.8–3.1)
Abs Monos: 0.7 10*3/uL (ref 0.2–0.8)
Basophils: 0 %
Eosinophils: 3 %
Hct: 40.8 % (ref 34.0–45.0)
Hgb: 13.4 gm/dL (ref 11.2–15.7)
Lymphocytes: 18 %
MCH: 27.1 pg (ref 26.0–32.0)
MCHC: 32.8 g/dL (ref 32.0–36.0)
MCV: 82.6 um3 (ref 79.0–95.0)
MPV: 9.4 fL (ref 9.4–12.4)
Monocytes: 9 %
Plt Count: 329 10*3/uL (ref 140–370)
RBC: 4.94 10*6/uL (ref 3.90–5.20)
RDW: 16.8 % — ABNORMAL HIGH (ref 12.0–14.0)
Segs: 70 %
WBC: 7.5 10*3/uL (ref 4.0–10.0)

## 2018-04-19 LAB — VITAMIN D, 25-OH TOTAL
Vitamin D, 25-OH D2: <5 ng/mL
Vitamin D, 25-OH D3: 40 ng/mL
Vitamin D, 25-OH TOTAL: 40 ng/mL (ref 30–80)

## 2018-04-19 LAB — MRSA SURVEILLANCE CULTURE

## 2018-04-19 MED ORDER — OLANZAPINE ODT 10 MG OR TBDP
10.00 mg | ORAL_TABLET | Freq: Three times a day (TID) | ORAL | Status: DC
Start: 2018-04-19 — End: 2018-04-20
  Administered 2018-04-19 – 2018-04-20 (×3): 10 mg via ORAL
  Filled 2018-04-19 (×3): qty 1

## 2018-04-19 MED ORDER — ATORVASTATIN CALCIUM 40 MG OR TABS
40.00 mg | ORAL_TABLET | Freq: Every evening | ORAL | Status: DC
Start: 2018-04-19 — End: 2018-04-20
  Administered 2018-04-19: 40 mg via ORAL
  Filled 2018-04-19: qty 1

## 2018-04-19 NOTE — Interdisciplinary (Signed)
[facility] Occupational Therapy Daily Note     Community meeting ([id number]) x 40 minutes  (Associative level - Group members have brief verbal or nonverbal interactions.)  Pt was alert, very vocal and interactive in group    Bingo Group (97150) x 60 minutes (Basic Cooperative level - Pt can select and perform in longer periods of work and play, following rules.)    Gratitude game (97150 x 30 minutes): self reflection, self disclosure, socialization (Basic Cooperative level - Pt can select and perform in longer periods of work and play, following rules.)    Dressing: Mod A  Eating: Mod I  Ambulation: SBA  Sit to stand: SBA  Transfers: Mod I  Toileting: Max A  Hygiene: Mod A  Grooming: Mod A  Bathing: Mod A    Abnormal functional mobility or tone: Due to reported pain, patient prefers WC for propulsion on the unit. Patient is able to ambulate with FWW, though poses a fall risk due tobackpain and poor LB strengthand coordination.    Based on Teepa Snows GEMSTM Brain Change model, pt is currently functioning as a(n):   Amber- All about sensation: seeks to satisfy desires and tries to avoid what is disliked. In the [facility]. Environment can drive actions and reactions, without safety awareness. Help meet sensory needs. Visual abilities are limited. Physically intrusive. Possible low sensory tolerance. Busy fingers. Things in mouth. Fiddling & messing. Exploring their world. Has periods of intense activity; may be very curious or repetitive with objects or actions. Time alone/with others. Quiet space/Busy space. Upset during personal care tasks. Use Hand under Hand and resting hand approach. Back off & re approach when pt is agitated. Care is refused or seen as threatening due to differences in perspective and ability.     Assessment of Target Symptoms: Pt stated feeling "statistically electrified" with goal to attend all meetings/groups, especially Bingo and eat. Pt demonstrated ability to manage 3 Bingo  cards simultaneously with 1 Verbal Cue to achieve 75% accuracy. Pt won the [employer] Power[employer]ingo game and selected a hat for [employer]'s prize. Pt was pleasant upon approach and during groups today. During [employer]ratitude Game, pt stated being grateful for pt's 3 daughters, pt's goal to ambulate more frequently to care for pt's physical health, and pt stated ability to redirect negative thoughts and replace them with positive thoughts.    Discharge Recommendation/Inter- Professional Treatment Plan: Patient would benefit from secure facility to ensure proper medication management as well as assist with safe completion of ADL due to impaired physicaland cognitive abilities.      Majority of note was written by Alexandria DownerSara Proctor, Occupational Therapy student (OTS).  Note has been reviewed and edited for grammatical and clinical assessment purposes by Alexandria Proctor, MOT, OTR/L

## 2018-04-19 NOTE — Progress Notes (Signed)
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     Target Symptoms: auditory hallucinations, mild disorganization, suicidal ideation     Discharge Criteria:   - Patient will have no verbalizations of suicide ideations for 48 consecutive hours (or two consecutive days).  - Appropriate aftercare plans will be established based on the patient's level of care needs, cognitive strengths and weaknesses, geographical location, and financial criteria.  - Patient will establish a stable medication regimen; no problem behaviors requiring medications for 48 consecutive hours (or two consecutive days).    Subjective:   Patient was seen and evaluated. Electronic medical records were reviewed including medication administration records and flow sheets. Patient was discussed in the interdisciplinary team meeting.     Per nursing report, patient slept 7.25 hours and consumed 80/100/100 percent of meals in the last 24 hours. Last bowel movement was on 7/25. Per nursing notes, the patient was restless, irritable, and had several complaints. On 07/27, she cursing staff.  On 07/28, she was labile, demanding, hostile, derailed easily, made angry outbursts in relation to medications, and accused staff of racism.  Vital signs were tachycardic to 104 on 07/28 at 1011 and hypotensive to 91/67 on 07/28 at 1535.  Behavioral PRNs required over the last 72 hours include gabapentin 148m PO on 07/27 5x at 0[place] 1400, 1608, 1906, on 07/28 5x at 0[place] 1848, 2014, on 07/29 2x  at 0[place] 0542; olanzapine 2.563mon 07/27 3x at 0112, 1405, 1730, on 07/28 4x at 00[place]1048, 1701, and 07/29 2x at 0010 and 0539 for agitation and resltessness, with minimal to good effect. Per nursing, patient has been demanding and medication seeking, particularly for buprenorphone and pain medication.     On interview this morning, the patient reports that she is doing well. We discussed plan to increase her standing dose of olanzapine to 1049mhree times a day and take away her as needed medications. Also discussed methadone maintenance therapy with her and she was agreeable.  She was pleasant and cooperative with her treatment team this morning. Denies that she wants to hurt herself and says she won't listen to the voices in her head.     Family, Psychiatric, or Social History Updates:   I asked patient to provide any phone numbers she would have for collateral and she refused. I [facility] to verify her prescriptions, she last picked up bupropion 150m86mily on 04/06/18. Patient states today that she gets her prescription from "random doctors" and does not follow with a regular doctor.     Scheduled Medications:   buPROPion  300 mg Daily    lansoprazole  30 mg BID AC    nicotine  1 patch Daily    polyethylene glycol  17 g BID     PRN Medications:   acetaminophen  650 mg Q4H PRN 650 mg at 04/15/18 1730    bisacodyl  10 mg Daily PRN 10 mg at 04/15/18 1030    OLANZapine  2.5 mg TID PRN 2.5 mg at 04/16/18 0154     Review of  Systems: 14 point review of systems was negative, except as above    Objective:   04/15/18  1021 04/15/18  1635 04/15/18  1638 04/16/18  0600   BP: 114/85 115/61 105/78 136/88   Pulse: 60 56 80 54   Resp:  '16 16 18   ' Temp:  97.5 F (36.4 C)  98.1 F (36.7 C)   SpO2:  97% 97% 99%     Pain: Pain Score: 9    New Labs/Studies:     Lab Results   Component Value Date    WBC 7.9 04/14/2018    RBC 4.62 04/14/2018    HGB 12.7 04/14/2018    HCT 39.5 04/14/2018     MCV 85.5 04/14/2018    MCHC 32.2 04/14/2018    RDW 17.2 (H) 04/14/2018    PLT 273 04/14/2018    PLT 348 03/18/2007    MPV 9.2 (L) 04/14/2018     Lab Results   Component Value Date    BUN 17 04/14/2018    CREAT 0.87 04/14/2018    CL 109 (H) 04/14/2018    NA 145 04/14/2018    K 4.5 04/14/2018    [place] 9.6 04/14/2018    TBILI 0.19 04/14/2018    ALB 4.1 04/14/2018    TP 6.8 04/14/2018    AST 22 04/14/2018    ALK 97 04/14/2018    BICARB 24 04/14/2018    ALT 35 (H) 04/14/2018    GLU 144 (H) 04/14/2018     Lab Results   Component Value Date    A1C 5.3 04/15/2018     Lab Results   Component Value Date    TSH 2.53 04/14/2018     Lab Results   Component Value Date    CHOL 158 06/13/2012    HDL 62 06/13/2012    LDLCALC 79 06/13/2012    TRIG 85 06/13/2012     Mental Status Examination:    Appearance: Overweight, who appears stated age.  She is dressed in dark green hospital gown, seated in a wheelchair wheeling herself around the unit  Behavior: Pleasant, cooperative.  Motor / abnormal involuntary movements: No psychomotor agitation or retardation noted.  Gait: Uses wheelchair independently, able to use lower extremities to propel herself around the unit  Speech: Normal volume, rate, tone  Mood: "Fine"  Affect: Euthymic, congruent  Thought process: coherent, superficially linear, goal-directed  Thought content: No overt psychosis, no paranoia, grandiosity. Denies current suicidal and homicidal ideation, intent and plan.  Perceptions: Does not appear to be responding to internal stimuli but endorses command auditory hallucinations. Does not endorse visual hallucinations this morning.   Insight / judgment: limited / poor  Sensorium / orientation / intellectual functions: Alert and oriented to self. Unable to assess orientation to time, place, situation.     [place] cognitive assessment   Refused on 04/15/18    Neuroimaging  CT STROKE ANGIOGRAPHY HEAD AND NECK   04/07/2018:  1. No evidence of intracranial hemorrhage or mass lesion,  mass effect or midline shift, herniation or hydrocephalus, or extra-axial fluid collection. No evidence of acute transcortical infarct. No hyperdense vessel sign.  2. Essentially non-diagnostic CTA head and neck due to premature contrast bolus timing related to extensive venous collaterals within the neck and upper chest.    MRI BRAIN W/O CONTRAST  04/07/2018:  Study quality degraded by patient motion on most sequences.  No acute intracranial findings. Specifically, no acute infarct.    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    Patient has been coherent, superficially linear, and not responding to internal stimuli while on the unit. While irritable and with poor frustration tolerance, her behavior has been non-agitated and organized, which is marked change from her presentation prior to [facility] admission. She has been receiving olanzapine with some apparent improvement in psychotic features.     While her urine toxicology is negative, substance-induced psychosis remains higher on the differential given her current and prior presentations. She has a prior suicide attempt in 2017, at which time toxicology was positive for methamphetamines. In addition, records from the [facility] report and Care Everywhere suggest active substance use disorder, raising the possibility of secondary gain. Given prior suicide attempt in context of  psychosis, she warrants inpatient hospitalization for safety and stabilization of reported psychiatric symptoms as well as diagnostic clarification.     Active Problem List:  Active Hospital Problems    Diagnosis    *Major depressive disorder    Suicidal ideation    Vitamin D deficiency    Chronic abdominal pain    Amphetamine abuse (CMS-HCC)    Opioid abuse (CMS-HCC)    HTN (hypertension)    HLD (hyperlipidemia)       Plan:  A.  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  - discontinue all PRN olanzapine and gabapentin (7/29). Pt aware.   - Continue gabapentin 300 mg TID for pain/anxiety (added 7/28)  - Order pain medicine consult. Will ask social work to provide resources for methadone maintenance for pain/opiate dependence    #Opiate use disorder  -Will engage in motivational interview when psychiatrically appropriate  - As per [facility] and pharmacy, no recent prescription for buprenorphine     # Tobacco use disorder  - Nicotine patch 1483m4 hours  -Will engage in motivational interview when psychiatrically appropriate  B.  Medical:    Appreciate recommendations from geriatric psychiatry.    C.  Psychosocial: Group and milieu therapy    D.  Legal: Voluntary as of 04/15/18    E.  Disposition: To home when psychiatrically stable    This patient and her care were discussed with the attending psychiatrist, Dr. SteScheryl Marten

## 2018-04-19 NOTE — Plan of Care (Addendum)
Problem: Promotion of Mental Health and Safety  Goal: The pt remains safe, receives appropriate treatment and achieves outcomes (psychologically, psychosocially, physically, and spiritually) within the limitations of the disease process by discharge.  Outcome: Not Progressing  Flowsheets  Taken 04/19/2018 0900  Patient /Family stated Goal: "To go to the meeting"  Taken 04/19/2018 1323  Guidelines: Inpatient Nursing Guidelines;Mental Health Assessment;Mood disorder;Thought disorder;Psychosis;Behavioral Issues    Outcome Evaluation (rationale for progressing/not progessing) every shift: Patient remained labile. Pleasant and conversant with staff at the start of the shift but became angry later when she asked for her a.m. medications from the medication nurse and did not received them right away. She was irritable and demanding, wanted things done right away for her especially when she asked for medications. She reported hearing voices early this morning but not commanding voices, just humming and chanting. She said that she's paranoid about her ex-boyfriend and scared to go back to her place because her ex is going to kill her. 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 She talked  about how good she was in telemetry, reading EKG results and educated this staff about how to read EKG. She was focused on getting medications. She requested for Tylenol for 8/10 right arm pain at 10:50 and was helpful. Pain went down to 3/10 after the PRN. She reported feeling anxious. Stated that she felt angry inside and all were messed up. Stated that she needed something right away. Zyprexa Zydis 2.5 mg p.o. and Gabapentin 100 mg p.o. given at 11:20 and were effective. MD aware of patient's behavior and made changes to her medications. Patient participated in groups. She was up per wheelchair but was ambulating short distances. Gait  unsteady. Safety education reinforced.  No  Falls.     Individualized Interventions/Recommendations #1: Encourage patient to participate in milieu activities  Individualized Interventions/Recommendations #2: Provide emotional suppoprt  and redirections  Individualized Interventions/Recommendations #3: Assess for increasing anxiety and agitation, medicate as needed  Individualized Interventions/Recommendations #4: Educate about fall prevention and encourage to ask for assistance as needed  Individualized Interventions/Recommendations #5: Educate about her medications and encourage compliance with treatment

## 2018-04-19 NOTE — Interdisciplinary (Addendum)
Physical Therapy Evaluation    Admitting Physician:  Darlina Rumpf, DO  Admission Date 04/14/2018    Inpatient Diagnosis:   Problem List       Codes    Psychosis, unspecified psychosis type (CMS-HCC)    -  Primary ICD-10-CM: F29  ICD-9-CM: 298.9    Impaired functional mobility and endurance     ICD-10-CM: Z74.09  ICD-9-CM: V49.89          Preferred Language:English    Start of Service  Start of Care: 04/19/18  Onset date : 04/14/18  Reason for referral: Activity tolerance limitation;Decline in functional mobility;Discharge disposition recommendations         Activity Restrictions  Activity Restrictions: None         Past Medical History:   Diagnosis Date    Amphetamine abuse (CMS-HCC)     Bipolar 1 disorder (CMS-HCC)     Bipolar affective (CMS-HCC)     Fracture, intertrochanteric, left femur (CMS-HCC)     Gastric bypass status for obesity     H/O: hysterectomy     HLD (hyperlipidemia)     HTN (hypertension)     Manic depression (CMS-HCC)     Obesity     Opioid abuse (CMS-HCC)     Schizophrenia (CMS-HCC)       Past Surgical History:   Procedure Laterality Date    GASTRIC BYPASS      hx gastric bypass      s/p IMN left IT fracture 07/20/16      STOMACH SURGERY      Ventral Hernia repair         Physical Therapy Evaluation     Row Name 04/19/18 1500          Medical History    History of presenting condition  Per chart, pt is a 62 y/o female who was brought in by police on 5150 for danger to self (04/14/18 at 0200) for threatening to jump off balcony and admitted to [facility] on 04/14/2018 for psychosis.      Fall history  No reported falls in the last 6 months     Row Name 04/19/18 1500          Functional History    Prior Level of Function  No deficits     Employment Status  Unemployed     Adult nurse Needs  Independent with mobility using assistive device     General ADL/Self-Care Assistance Needs  Independent with ADLs and self care using adaptive device/equipment     Equipment currently present/required in the home  Mobility Equipment/Device(s     Assistive Device required for Mobility  4 wheeled walker;Power wheelchair     Row Name 04/19/18 1500          Social History    Living Situation  Lives alone     Home Environment  Apartment     Home accessibility   Elevator access available     Row Name 04/19/18 1500          Subjective    Subjective Information  "I apologize for being rude last time"     Row Name 04/19/18 1500          Inpatient readiness for treatment    Patient status  Patient agreeable to treatment;Nursing in agreement for treatment     Patient premedicated prior to treatment  No     Row Name 04/19/18 1500          Pain  Assessment    Pain Asssessment Tool  Numeric Pain Rating Scale     Row Name 04/19/18 1500          Numeric Pain Rating Scale     Pain Description  Pt did not quantify pain; has chronic back pain     Row Name 04/19/18 1500          Objective    Overall Cognitive Status  Intact - no cognitive limitations or impairments noted     Communication  No communication limitations or impairments noted. Current status of hearing, speech and vision allow functional communication.     Coordination/Motor control  No limitations or impairments noted. Movement patterns are fluid and coordinated throughout     Balance  Balance limitations present     Static Sitting Balance - Control  Normal - able to maintain steady balance without handhold support     Dynamic Sitting Balance - Control  Normal - accepts maximal challenge and can shift weight easily within full range in all directions     Static Standing Balance - Control  Good - able to maintain balance without handhold support, limited postural sway     Dynamic Standing Balance - Control  Good - accepts moderate challenge, able to maintain balance while picking object off floor     Extremity Assessment  Flexibility and strength grossly within functional limits throughout     Functional Mobility  No limitations or  impairments in functional mobility noted. Patient independent in mobility activities of daily living     Sensation  Intact - no limitations or impairments noted throughout     Objective Findings  Pt was received sitting in WC upon arrival. [facility] and ambulated 50' with FWW x mod indep. Pt demos flexed posture 2/2 chronic back pain, however with safe management of FWW. Pt completed seated TE (LAQs, hip flexion, APs - 2x10) and UE there-ex with [place] thera-band (horizontal adduction/adduction).     Pt was left sitting in chair with all needs within reach, RN notified.          PT Functional     Row Name 04/19/18 1500       [place] AM-PAC: Basic Mobility    Assistance Needed to Turn from Back to Side While in a Flat Bed Without Using Bedrails  4    Difficulty with Supine to Sit Transfer  4    How Much Help Needed to Move to/from Bed to Chair  4    Difficulty with Sit to Stand Transfer from Chair with Arms  4    How Much Help Needed to Walk in Room  4    How Much Help Needed to Climb 3-5 Steps with a Rail  3    AMPAC Total Score  23    Assessment: AM-PAC Basic Mobility - Current Functional Impairment Level  Score 23          -     Row Name 04/19/18 1500          Patient/Family Education    Learner(s)  Patient     Patient/family training in appropriate therapeutic interventions  Initiated     Education Topic(s)  Benefits of out of bed to chair;Exercise Program;Mobility;DME use and maintenance     Row Name 04/19/18 1500          Physical Therapy Patient Discharge Instructions    Your Physical Therapist suggests the following  Continue to complete your  home exercise program daily as instructed;Continue to use your assistive device as instructed when walking to improve your stability and prevent falls     Row Name 04/19/18 1500          Assessment     Assessment   Pt is a 62 y/o female presenting to [facility] on 04/14/18 with suicidal ideation. Pt was able to ambulate short distances x mod indep  with FWW today (cues required for more upright posture). At this time, PT will maintain pt on caseload 1-2 x weekly to progress ther-ex and to continue to progress long distance ambulation. Pt has necessary equipment and is safe to d/c home once medically cleared.      Rehab Potential  Good     Row Name 04/19/18 1500          Functional Limitation Reporting     Assessment: AM-PAC Basic Mobility - Current Functional Impairment Level  Score 23     Row Name 04/19/18 1500          Patient stated Goal    Patient stated goal  None stated     Row Name 04/19/18 1500          Therapy Goals    Therapy Goals  Goal 1;Goal 2     Row Name 04/19/18 1500          Goal 1     Goal 1 Type  Short Term     Impairment  Gait     Goal 1  Pt will ambulate 500' x mod indep with LRAD     Number of visits  3-5     Goal Status  New     Row Name 04/19/18 1500          Goal 2     Goal 2 Type  Short Term     Impairment  Mobility     Goal 2  Pt will be indep with HEP     Number of visits  3-5     Goal 2  New     Row Name 04/19/18 1500          Planned Therapy Interventions and Rationale    Gait Training  to improve gait pattern;to improve safety while ambulating with assistive device;to improve safety with community ambulation     Neuromuscular Re-Education  to improve balance;to improve safety during dynamic activities     Patient Education  to improve energy conservation measures during functional activities;to increase independence in functional activities;to increase safety during dynamic activities     Therapeutic Activities  to improve functional mobility;to improve navigating home and/or community;to improve transfers between surfaces     Theraputic Exercise  to improve activity tolerance;to improve posture;to improve independence with functional mobility skills     Row Name 04/19/18 1500          Treatment Plan    Continue therapy to address   Activity tolerance limitation;Decline in functional mobility;Fall risk management;Safety/judgement  impairment     Frequency of treatment  2 times per week     Duration of treatment  5 visits     Status of treatment  Patient evaluated and will benefit from ongoing skilled therapy     Row Name 04/19/18 1500          Treatment Plan Disussion    Include in My Healthcare  Myself     Treatment Plan Discussion & Agreement  Patient     Patient/Family  Questions  Yes - All questions asked & answered     Row Name 04/19/18 1500          Therapy Plan Communication    Therapy Plan Communication  Discussed therapy plan with Nursing     Row Name 04/19/18 1500          Post Acute Discharge Considerations    Anticipated required level of cognitive assistance  None     Anticipated required level of safety assistance  None     Anticipated required level of physical assistance  None     Anticipated physical barriers impacting discharge  None     Anticipated  level of therapy tolerance and ongoing therapy needs  Outpatient     Equipment recommendations   Patient already owns all required equipment     Row Name 04/19/18 1500          Inpatient Patient Safety Considerations    Patient call light within reach  Yes     Patient left sitting   In a chair     Patient requires for safe ambulation  Front wheeled walker     The patient may be at risk for falls  No     Family/caregiver present for treatment  None Present     Row Name 04/19/18 1500          Inpatient Interdisciplinary Communication    PT Rehab Services Recommendations have been made?  Yes - DC plan identified for patient     Progressive Mobility  Progressive Mobility group     Add Additional Activity Rows  Walk in hall     Walk in [place]  Independent with assistive device     Turn in Bed  Independent     Row Name 04/19/18 1500          Outpatient Therapy Overview    Start of Care  04/19/18     Onset date   04/14/18     Row Name 04/19/18 1500          Type of Eval    Low Complexity [phone])  Completed     Row Name 04/19/18 1500          Therapeutic Procedures    Gait Training ([id number])   Assistive device training;Dynamic activities while walking;Patient education        Total TIMED Treatment (min)   15       Gait Training   see objective     Therapeutic exercise  ([id number])   Patient education;Strengthening exercises;Resistive band exercises        Total TIMED Treatment (min)   15       Therapeutic exercise    see objective     Row Name 04/19/18 1500          Treatment Time     Treatment start time  1500     Total TIMED Treatment  (min)  30     Total Treatment Time (min)  40            The physical therapist of record is endorsed by evaluating physical therapist.

## 2018-04-19 NOTE — Interdisciplinary (Signed)
Social Work Note:    Case discussed in geriatric psychiatry multidisciplinary treatment team rounds this morning. SW team will continue to follow and to assist with safe discharge planning.      Angie Eleisha Branscomb, MSW, ASW

## 2018-04-19 NOTE — Progress Notes (Addendum)
Chief Complaint: Management of medical issues.    HPI: Alexandria Proctor is a 62 year old female admitted to [facility].  We have been requested by [facility] to help manage the active medical issues.    The past medical history, surgical history, social history, and family history were reviewed.  I have reviewed the medical record, recent lab data, imaging data, and procedures.  This information has been used to update and contribute to my summary of active and chronic medical issues below.    Medical issues / present illness:    she was discussed on rounds with geropsychiatry on 04/19/18    She reported that she was upset that she is getting olanzapine twice daily stating she wants it more frequently.  She endorsed stable chronic abdominal pain and low back pain, denied diarrhea, nausea/vomiting, or other ROS as below.    I reviewed labs on 04/19/18 with pertinent findings below    Active issues:    1) Psychosis, NOS; depression; history of polysubstance abuse: She presented to the ED at [place] on 04/14/18 for evaluation of suicidal ideation, reporting a desire to jump off a building.  She has a history of prior fall from a 4th floor balcony while intoxicated on methamphetamine.  Psychiatry evaluated her and felt admission was warranted, and hence she was admitted to [facility] on 04/15/18.  - 04/07/18 MRI brain: "Study quality degraded by patient motion on most sequences.  No acute intracranial findings. Specifically, no acute infarct."    2) Essential hypertension: She is not prescribed pharmacotherapy  - SBP has ranged 91-110 over the past 24 hours with outlier of 151/77    3) Chronic pain syndrome :She has chronic low back pain and chronic abdominal pain for which she has a history of seeking opiates from numerous different providers around [facility], with no routine care provider.  She was prescribed lidocaine patch 5% daily and tylenol 650 mg q4h PRN pain on admission to [facility]    4) Constipation: She was prescribed Miralax 17 g BID  which was discontinued on 04/16/18 due to patient report of diarrhea.  - Last BM 04/17/18    5) Hyperlipidemia: She was not prescribed pharmacotherapy.  The 10-year ASCVD risk score Mikey Bussing DC Jr., et al., 2013) is: 18.7%    Values used to calculate the score:      Age: 67 years      Sex: Female      Is Non-Hispanic African American: Yes      Diabetic: No      Tobacco smoker: Yes      Systolic Blood Pressure: 295 mmHg      Is BP treated: No      HDL Cholesterol: 44 mg/dL      Total Cholesterol: 167 mg/dL    Past Medical History:  Patient Active Problem List    Diagnosis Date Noted    Suicidal ideation 04/15/2018    Major depressive disorder 04/15/2018    Fibromyalgia 04/15/2018    GERD (gastroesophageal reflux disease) 04/15/2018    Vitamin D deficiency 04/15/2018    Chronic abdominal pain 04/15/2018    Bipolar affective (CMS-HCC)     Amphetamine abuse (CMS-HCC)     Opioid abuse (CMS-HCC)     Fracture, intertrochanteric, left femur (CMS-HCC)     HTN (hypertension)     HLD (hyperlipidemia)     Obesity     Closed displaced fracture of left femoral neck (CMS-HCC) 07/19/2016    Benign neoplasm of  colon removed during colonoscopy 06/16/2012    Abnormal liver function test 06/15/2012    Abdominal pain, acute suspected to be due to gastroenteritis 06/14/2012    Abdominal Lipoma 06/14/2012    Suicide ideation cleared by Psychiatry for Discharge 06/14/2012      Past Surgical History:   Procedure Laterality Date    GASTRIC BYPASS      hx gastric bypass      s/p IMN left IT fracture 07/20/16      STOMACH SURGERY      Ventral Hernia repair       Review of Systems - Constitutional: negative for: night sweats, fever.  CV: negative for:  palpitations, chest pain, lower extremity edema.  Resp: negative for:  cough, sputum, shortness of breath.  GI: abdominal pain, nausea, negative for: vomiting, constipation, diarrhea.  GU: negative for: dysuria, hematuria.  Musculoskeletal: negative for: joint pain, back pain,  muscle pain.  Integumentary: negative for: rash, itching, bruising.  Neuro: negative for: paralysis/weakness, numbness or tingling, speech impairment.  Psych: see HPI.    Social History     Tobacco Use    Smoking status: Current Every Day Smoker     Packs/day: 1.00     Types: Cigarettes    Smokeless tobacco: Never Used   Substance Use Topics    Alcohol use: Yes     Alcohol/week: 0.0 oz      Family History   Problem Relation Name Age of Onset    Schizophrenia Mother      Hypertension Mother        Allergies:   Allergies   Allergen Reactions    Compazine Anaphylaxis     Per patient tolerates promethazine/Phenergan    Haldol [Haloperidol] Swelling    Ondansetron Swelling    Reglan [Metoclopramide] Anaphylaxis    Ultram [Tramadol] Swelling    Zofran [Ondansetron] Anaphylaxis    Toradol Other     "my throat closes up and tongue swells up"        Medications - Scheduled:   buPROPion  150 mg Daily    gabapentin  300 mg TID    lansoprazole  30 mg BID AC    lidocaine  1 patch Q24H    nicotine  1 patch Daily    OLANZapine  7.5 mg BID       Medications - As needed:   acetaminophen  650 mg Q4H PRN    bisacodyl  10 mg Daily PRN    gabapentin  100 mg TID PRN    loperamide  2 mg 4x Daily PRN    OLANZapine  2.5 mg TID PRN       Vital Signs: BP 151/77 (BP Location: Left arm, BP Patient Position: Supine)    Pulse 66    Temp 97.9 F (36.6 C)    Resp 20    Ht '5\' 6"'$  (1.676 m)    Wt 94.4 kg (208 lb 3.2 oz)    SpO2 98%    BMI 33.60 kg/m   Vitals:    04/18/18 1011 04/18/18 1535 04/18/18 1848 04/19/18 0545   BP: 96/61 91/67  151/77   BP Location:  Left arm  Left arm   BP Patient Position: Standing Sitting  Supine   Pulse: 104 73  66   Resp:  16  20   Temp:  97.8 F (36.6 C)  97.9 F (36.6 C)   SpO2:  98%  98%   Weight:   94.4 kg (208 lb 3.2  oz)    Height:         Physical Exam: General Appearance: alert, no distress, pleasant affect, cooperative.  Eyes:  conjunctivae and corneas clear. PERRL, EOM's intact.  Nose:   normal.  Mouth: normal.  Neck:  Neck supple. No adenopathy, thyroid symmetric, normal size.  Heart:  normal rate and regular rhythm, no murmurs, clicks, or gallops.  Lungs: clear to auscultation and percussion, no chest deformities noted.  Abdomen: Abdomen soft, tender to light palpation, and expressed tenderness prior to starting palpation. No masses or organomegaly. Bowel sounds normal.  Extremities:  no cyanosis, clubbing, or edema.  Skin:  Skin color, texture, turgor normal. No rashes or lesions.  Neuro: Gait normal. Reflexes normal and symmetric. Sensation and strength grossly normal.  Musculoskeletal: neck: normal C-spine, no tenderness, FROM without pain  back:Back symmetric, no curvature. ROM normal. No CVA tenderness.    Lab Results   Component Value Date    WBC 7.5 04/19/2018    RBC 4.94 04/19/2018    HGB 13.4 04/19/2018    HCT 40.8 04/19/2018    MCV 82.6 04/19/2018    MCHC 32.8 04/19/2018    RDW 16.8 (H) 04/19/2018    PLT 329 04/19/2018    PLT 348 03/18/2007    MPV 9.4 04/19/2018     Lab Results   Component Value Date    BUN 19 04/19/2018    CREAT 0.81 04/19/2018    CL 106 04/19/2018    NA 145 04/19/2018    K 4.4 04/19/2018    [place] 9.9 04/19/2018    TBILI 0.26 04/19/2018    ALB 4.2 04/19/2018    TP 7.0 04/19/2018    AST 18 04/19/2018    ALK 96 04/19/2018    BICARB 26 04/19/2018    ALT 30 04/19/2018    GLU 91 04/19/2018     Lab Results   Component Value Date    A1C 5.3 04/15/2018     Lab Results   Component Value Date    TSH 2.53 04/14/2018     Lab Results   Component Value Date    CHOL 167 04/15/2018    HDL 44 04/15/2018    LDLCALC 102 04/15/2018    TRIG 107 04/15/2018     Lab Results   Component Value Date    VD2 <5 04/15/2018    VD3 40 04/15/2018    VDT 40 04/15/2018      Assessment/Plan:    1) Chronic pain syndrome with low back and abdominal pain; opiate dependence: No objective signs of opiate withdrawal.   - Dicyclomine PRN has been discontinued as this is highly anticholinergic and to be avoided in  the elderly.  - Discontinue loperamide PRN due to lack of objective evidence of diarrhea  - Continue lidocaine patch 5% daily to low back    2) Constipation: Miralax was discontinued on 04/16/18 due to report of diarrhea, has not had a bowel movement since 04/17/18.  She is reluctant to resume laxatives  - Discontinue loperamide PRN.    - Bisacodyl 10 mg rectal daily PRN constipation  - If no bowel movement by 04/20/18 can resume Miralax 17 g daily    3) Essential hypertension: Her BP remains at goal <150/90 without need for pharmacotherapy    4) Hyperlipidemia: ASCVD risk 18.7%, will start atorvastatin 40 mg daily    5) Vitamin D deficiency: Continue cholecalciferol 2000 units daily    6) Psychosis, NOS; depression; polysubstance abuse: She remains at  [facility] for evaluation and treatment  - Geropsychiatry to manage medications    7) DVT Prophylaxis: She is ambulatory around [place]. Angelica Ran, MD  Geriatric Medicine Attending  Pager: [phone]

## 2018-04-19 NOTE — Discharge Summary (Signed)
Psychiatry Service Discharge Summary    Date of Admission:  04/14/2018  Date of Discharge:  04/20/2018    Hospital Problem List (required):  Active Hospital Problems    Diagnosis    *Psychosis (CMS-HCC) [F29]    Suicidal ideation [R45.851]    Major depressive disorder [F32.9]    Vitamin D deficiency [E55.9]    Chronic abdominal pain [R10.9, G89.29]    Amphetamine abuse (CMS-HCC) [F15.10]    Opioid abuse (CMS-HCC) [F11.10]    HTN (hypertension) [I10]    HLD (hyperlipidemia) [E78.5]      Resolved Hospital Problems   No resolved problems to display.       Additional Hospital Diagnoses ("rule out" or "suspected" diagnoses, etc.):  Suspected Cluster B traits    Principal Procedure During This Hospitalization (required):    X-RAY ABDOMEN SINGLE VIEW  04/15/2018:  IMPRESSION:  Excess colonic feces.    MRI Brain W/O CONTRAST  IMPRESSION:  Study quality degraded by patient motion on most sequences.    No acute intracranial findings. Specifically, no acute infarct.    Other Procedures Performed During This Hospitalization (required):    ECG  04/14/2018:  QTc:440      Procedure results are available in Chart Review in Epic.  For those providers external to [place], the key procedure results are listed below:  No procedures during this admission     Consultations Obtained During This Hospitalization:  Geriatric Medicine    Key consultant recommendations:  1) Chronic pain syndrome with low back and abdominal pain; opiate dependence: No objective signs of opiate withdrawal.   - Dicyclomine PRN has been discontinued as this is highly anticholinergic and to be avoided in the elderly.  - Discontinue loperamide PRN due to lack of objective evidence of diarrhea  - Continue lidocaine patch 5% daily to low back    2) Constipation: Miralax was discontinued on 04/16/18 due to report of diarrhea, has not had a bowel movement since 04/17/18.  She is reluctant to resume laxatives  - Discontinue loperamide PRN.    - Bisacodyl 10 mg  rectal daily PRN constipation  - If no bowel movement by 04/20/18 can resume Miralax 17 g daily    3) Essential hypertension: Her BP remains at goal <150/90 without need for pharmacotherapy    4) Hyperlipidemia: ASCVD risk 18.7%, will start atorvastatin 40 mg daily    5) Vitamin D deficiency: Continue cholecalciferol 2000 units daily    Reason for Admission to the Hospital:  Alexandria Proctor is a29 year old single, unemployed, domiciled African Americanfemale with a past medical history of fibromyalgia, chronic pain, hypertension, hyperlipidemia and a past psychiatric history of unspecified mood disorder, opiate use disorder, amphetamine use disorder, prior supected malingering who was brought in by police on 4076 for danger to self (7/24/19at 0200) for threatening to jump off balcony and admitted to [facility] on 04/14/2018 for psychosis. Urine toxicology was negative in setting of previous suicide attempt in 2017 in which she jumped off a building while intoxicated on methamphetamines.     On attending re-evaluation in the afternoon, patient was slightly more linear, but continued to be preoccupied with "mean and nasty" voices that tell her to harm herself, and she continues to have thoughts of jumping from a height in response to the voices. She easily becomes distressed, fidgeting and rubbing her hands and arms, states "all these questions are too much for me."     Per consult evaluation in Emergency Department, she was noted to be disorganized  in hallway, yelling, "there's someone out my window!" Patient was quite paranoid saying, "don't leave me!" When asked if she would like some medication to help her stay calm, she said "yes! But not haldol that gives me anaphylactic shock!" She was open to taking olanzapine. She had a Bible clutched in hand. Patient too disorganized to participate in further conversation.     Hospital Course by Problem:    A. 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 She requested buprenorphine since she [facility] We checked her CURES report which did not show any outpatient prescriptions for buprenorphine and her utox on presentation was negative for any opioids. As a result, patient was monitored for withdrawal symptoms and provided PRN opioid withdrawal medications which were eventually discontinued as she was not displaying opioid withdrawal symptoms and the risks of the medications. Her outpatient pharmacy reported that she was on bupropion 1509maily, but this was discontinued altogether due to suspicion that this medication was activating her. In addition, she was started on gabapentin 300m91m TID for pain and anxiety. Due to patient's repeated behavioral outbursts requiring multiple PRN doses of olanzapine 2.5mg,48me was started on olanzapine 2.5mg P78mID for psychosis and mood stabilization, which was eventually titrated up to 10mg P33mD prior to discharge. She received a trial oxcarbazepine 300mg PO36m for mood disorder and pain, but it was discontinued prior to discharge because she reported intolerable side effects. Throughout her admission, she was linear and organized, able to make her needs known, but often used behavioral outbursts to obtain her desired PRN medications, particularly olanzapine. We discussed with her the importance of following up with her addiction counselor and [facility]. She also had a primary care appointment and a mental health appointment scheduled for her. She was discharged on olanzapine 10mg TID51m gabapentin  300mg TID.69minstructed her to stop taking bupropion.     Overall, patient reconstituted quickly from her presentation in the ED, raising the possibility of substance-induced psychosis vs affective dysregulation. She presented as medication seeking throughout her admission and was abusive with staff when requests for more medication were denied. She would benefit greatly from substance abuse treatment outpatient as well as regular follow up with psychiatry for medical management and therapy to address poor frustration tolerance and poor coping skills. At this time, her SI and psychotic symptoms have been stabilized and she is future oriented and voices multiple protective factors, including her grandchildren, desire to engage in medical and psychiatric treatment, responsibility to pets. She is at low acute risk of suicide currently but is chronically at elevated risk due to substance use and poor coping skills. Further inpatient care would not modify her chronic risk factors and we have made appointment for her to address substance use, medical, and psychiatric needs as an outpatient.     B. Medical:   Patient was medically stable during her admission. She did not require pharmacotherapy for hypertension. She has chronic pain syndrome involving low back pain and abdominal pain, for which she has a history of seeking opiates from numerous different providers  around [facility], with no routine care provider.  She was prescribed lidocaine patch 5% daily and tylenol 650 mg q4h PRN pain on admission to [facility]. She was prescribed atorvastatin 51m daily for her hyperlipidemia due to ASCVD risk 18.7%. For Vitamin D deficiency, cholecalciferol 2000U daily was continued.     C. Legal: Voluntary    Comprehensive Risk Assessments at Discharge:  A comprehensive suicide risk assessment was performed and the patient was assessed to be at a low acute risk of suicide.    Modifiable risk factors include precipitating stressors  and intoxication. Non-modifiable risk factors include previous suicide attempts, existing psychiatric diagnoses and older age. The patient also has protective factors of future life plans, therapeutic relationships and access to health care.    A violence risk assessment was also performed. The following behavioral risk factors are associated with acute violence risk and have been present within the past 24 hours: irritability, impulsivity, sensitivity to perceived provocation, easily angered if requests are denied and negative attitudes (eg paranoia, hostility). However, she displayed these behaviors to staff in the context of attempting to have medication needs met by inpatient staff as well as her expressed frustration at being in a secure unit. She does not have a known history of violence. Historical (non-modifiable) risk factors for violence in this patient include: none. Based on these factors, this patient's acute risk of violence in the inpatient setting in the next 24 hours is assessed to be low.       Tests Outstanding at Discharge Requiring Follow Up:  None    Discharge Condition (required):  Stable.    Mental Status Examination at Discharge:    Appearance: Elderly African American female who appears her stated age.  She is wearing a blonde wig and is seated in a wheelchair in the milieu.  Behavior: Cooperative, labile. Maintains good eye contact.  Motor / abnormal involuntary movements: No psychomotor agitation or retardation appreciated.  Gait: Uses wheelchair.  Speech: Regular rate and rhythm with animated tone  Mood: "Energized!"  Affect: Euthymic.  Affect consistent.  Thought process: coherent, logical  Associations: linear  Thought content: No evidence of suicidal or homicidal ideation, intent, or plan.  Perceptions: no abnl perceptions  Insight / judgment: fair / limited  Sensorium: Level of consciousness awake; attentive; recent memory intact; remote memory intact  Orientation: Patient is  oriented to person, place and situation  Intellectual Functions: Fund of knowledge average based on grammar and vocabulary;     Clinical Global Impression - Severity: 3= Mildly ill  :Suggested Guidelines= Clearly established symptoms with minimal, if any, difficuly in social and occupational function.     [place] Functional Assessment Scale:  Time of Discharge Total Score: 15    Key Physical Exam Findings at Discharge:  Cardiovascular: Regular rate and rhythm, normal S1 and S2.  No rubs, gallops, or murmurs.  Pulmonary: Clear to auscultation bilaterally.  No rhonchi, wheezes, or rales.  Abdomen: +Diffuse tenderness to palpation. Soft, non-distended.  Normal active bowel sounds.      Discharge Diet:  Regular.    Discharge Medications:     What To Do With Your Medications      START taking these medications      Add'l Info   atorvastatin 40 MG tablet  Commonly known as:  LIPITOR  Take 1 tablet (40 mg) by mouth every evening.   Quantity:  14 tablet  Refills:  1     gabapentin 300 MG capsule  Commonly  known as:  NEURONTIN  Take 1 capsule (300 mg) by mouth 3 times daily for 14 days.   Quantity:  42 capsule  Refills:  1     lidocaine 5 % patch  Commonly known as:  LIDODERM  Apply 1 patch topically every 24 hours for 14 days. Leave patch on for 12 hours, then remove for 12 hours.  Start taking on:  04/21/2018   Quantity:  14 patch  Refills:  1     nicotine 14 MG/24HR  Commonly known as:  NICODERM CQ  Apply 1 patch topically daily.  Start taking on:  04/21/2018   Quantity:  28 patch  Refills:  1     nicotine polacrilex 2 MG lozenge  Commonly known as:  COMMIT  Suck 1 lozenge (2 mg) in mouth every hour as needed for Smoking Urge   Quantity:  20 lozenge  Refills:  1     OLANZapine 10 MG tablet  Commonly known as:  ZYPREXA  Take 1 tablet (10 mg) by mouth 3 times daily for 14 days.   Quantity:  42 tablet  Refills:  1        STOP taking these medications    acetaminophen 325 MG tablet  Commonly known as:  TYLENOL     amLODIPINE 2.5  MG tablet  Commonly known as:  NORVASC     buPROPion 100 MG tablet  Commonly known as:  WELLBUTRIN     carisoprodol 350 MG tablet  Commonly known as:  SOMA     cyclobenzaprine 10 MG tablet  Commonly known as:  FLEXERIL     diphenhydrAMINE 50 MG/ML injection  Commonly known as:  BENADRYL     docusate sodium 250 MG capsule  Commonly known as:  COLACE     HYDROcodone-acetaminophen 5-325 MG tablet  Commonly known as:  NORCO     hydrocortisone 2.5 % ointment     lansoprazole 30 MG capsule  Commonly known as:  PREVACID     methadone 10 MG tablet  Commonly known as:  DOLOPHINE     methocarbamol 500 MG tablet  Commonly known as:  ROBAXIN     methocarbamol 750 MG tablet  Commonly known as:  ROBAXIN     nalOXone 0.4 MG/ML injection  Commonly known as:  NARCAN     pantoprazole 20 MG tablet  Commonly known as:  PROTONIX     pregabalin 200 MG capsule  Commonly known as:  LYRICA     senna 8.6 MG tablet  Commonly known as:  SENOKOT     sodium chloride 0.9 % solution     tobramycin-dexamethasone ophthalmic ointment  Commonly known as:  TOBRADEX     tobramycin-dexamethasone ophthalmic suspension  Commonly known as:  TOBRADEX           Where to Get Your Medications      These medications were sent to [facility], [place] [address]    Hours:  Mon-Fri: 8:30am-7:00pm; Sat-Sun & Holidays: 9:00am-5:00pm Phone:  [phone]    atorvastatin 40 MG tablet   gabapentin 300 MG capsule   lidocaine 5 % patch   nicotine 14 MG/24HR   nicotine polacrilex 2 MG lozenge   OLANZapine 10 MG tablet         Patient Discharging with >1 antipsychotic, including PRN antipsychotics: No    Allergies:  Allergies   Allergen Reactions    Compazine Anaphylaxis  Per patient tolerates promethazine/Phenergan    Haldol [Haloperidol] Swelling    Ondansetron Swelling    Reglan [Metoclopramide] Anaphylaxis    Ultram [Tramadol] Swelling    Zofran [Ondansetron] Anaphylaxis    Toradol Other     "my  throat closes up and tongue swells up"       Discharge Disposition:  Home.    Discharge Code Status:  Full code / full care  This code status is not changed from the time of admission.    Follow Up Appointments:    * You have a follow-up appointment at [facility], [address] [place], [place] [address] [phone] with your counselor Saralyn Pilar AOD, SUD on April 21, 2018 at 11:00 am. You will need to see your counselor prior to receiving your dosing.     * We are recommending you follow-up at the [facility] [place] [phone] for both primary and psychiatric care:    * You have an appointment scheduled with a primary care physician, Dr. Tye Maryland, on April 22, 2018 at 4:00pm at the [facility]. Please arrive at 3:45 for a check-in.     * You have an appointment in the [facility] on May 13, 2018 at 10:00am at the [facility], [address], [place] [address]. If you are unable to make any of these appointments please call [phone].    Discharging 67 Contact Information:  [facility]) at [phone].    Sarita R. Manuella Ghazi, MD, PhD  PGY-2, Psychiatry

## 2018-04-19 NOTE — Plan of Care (Signed)
Problem: Promotion of Mental Health and Safety  Goal: The pt remains safe, receives appropriate treatment and achieves outcomes (psychologically, psychosocially, physically, and spiritually) within the limitations of the disease process by discharge.  Outcome: Not Progressing  Flowsheets  Taken 04/19/2018 1600 by Verlin GrillsNauman, Ferris Tally L, RN  Patient /Family stated Goal: "Call the humane society."  Taken 04/19/2018 1323 by Lou CalBaquirquir, Inocencia Olmos, RN  Guidelines: Inpatient Nursing Guidelines;Mental Health Assessment;Mood disorder;Thought disorder;Psychosis;Behavioral Issues  Individualized Interventions/Recommendations #1: Encourage patient to participate in milieu activities  Taken 04/19/2018 2147 by Verlin GrillsNauman, Izael Bessinger L, RN  Outcome Evaluation (rationale for progressing/not progessing) every shift: Received patient sleeping in her bed. She is alert and oriented x3, and presents as calm. She is cooperative and able to follow directions from staff. Loud and yells at times, but responds well to redirection. Patient focused on her discharge planning, and makes a call to the humane society, about her dog she wishes to pick up upon discharge. She is easily irritable and demanding, but limits set and patient is able to follow directions. Takes medications and is not argumentative. Perseverates on the thermostat, but redirected. Listens to music as her coping skill. No unsafe behavior noted. She is intermittently visible, but primarily isolative tonight. Declines assist with ADLs. Does not appear to be responding to internal stimuli, but does becomes irritable when asked questions.  Individualized Interventions/Recommendations #2: Provide support for mood and thought.  Individualized Interventions/Recommendations #3: Set firm limits when patient is testing boundaries.

## 2018-04-19 NOTE — Plan of Care (Addendum)
Problem: Promotion of Mental Health and Safety  Goal: The pt remains safe, receives appropriate treatment and achieves outcomes (psychologically, psychosocially, physically, and spiritually) within the limitations of the disease process by discharge.  04/19/2018 0319 by Elam CityAquino, Danasha Melman Dela Pena, RN  Outcome: Not Progressing  Flowsheets  Taken 04/19/2018 0319  Guidelines: Inpatient Nursing Guidelines;Mental Health Assessment;Psychosis;Mood disorder;Thought disorder;Behavioral Issues;Suicide Assessment and Precautions  Taken 04/19/2018 0237  Individualized Interventions/Recommendations #1: Promotion of good sleep hygiene.  Individualized Interventions/Recommendations #2: Maintain pt safety from fall and harm.  Individualized Interventions/Recommendations #3: Assess pt behavior and anxiety.  Individualized Interventions/Recommendations #4: Provide positive reinforcement on acceptable and aoppropriate behaviors.  Individualized Interventions/Recommendations #5: Educate and encourage patient compliance with medications and treatments.  Note:   Pt was seen lying in her bed, awake ,alert. Anxious,restless and agitated. Complaining of feeling anxious and was requesting for her meds. Gabapentin 100 mg and Olanzapine 2.5 mg was given at 0010 and a  chamomile tea; with good results. She was approaching this Clinical research associatewriter, Technical sales engineeraskiong for a copy of her medication lists. Asking about her routine Olanzapine that was changed today. Explained to the pt how the medication dispensing works with the time schedule on her new medication change.Which the nurse can only give the dose once last night cause the order change was already in the evening and the next would be in the morning. Pt was asking if she could have the routine dose at midnight since its twice a day. Pt was informed that it doesn't work that way. Pt understood the explanation and was apologetic for her inappropriate behavior during the evening shift when she was yelling and cursing  out loud by the common room.  Pt stayed calm and quiet,went to sleep in her room without prompting. Thin morning ,was complaining of being anxious after she was awaken for the Vital signs taking.Requested for her dose of Gabapentin and Olanzapine. Requesting if she could have the whole tablet of Olanzapine instead of half.  Pt was informed we can only give what is ordered dose and reminded pt that she  can   talk with the doctor about her medication this morning.  Pt calmed down, stayed by the day room reading book while listening religious music in IPAD. And had an early morning shower as she requested a staff to assist her. Cooperative with care . Had 5 hours of sleep .  04/19/2018 0237 by Elam CityAquino, Fatemah Pourciau Dela Pena, RN  Outcome: Not Progressing  Flowsheets (Taken 04/19/2018 [phone])  Guidelines: Inpatient Nursing Guidelines;Mental Health Assessment;Mood disorder;Thought disorder;Psychosis;Suicide Assessment and Precautions;Behavioral Issues  Individualized Interventions/Recommendations #1: Promotion of good sleep hygiene.  Individualized Interventions/Recommendations #2: Maintain pt safety from fall and harm.  Individualized Interventions/Recommendations #3: Assess pt behavior and anxiety.  Individualized Interventions/Recommendations #4: Provide positive reinforcement on acceptable and aoppropriate behaviors.  Individualized Interventions/Recommendations #5: Educate and encourage patient compliance with medications and treatments.  Note:   Pt was in her room asleep at the start of the shift.

## 2018-04-20 ENCOUNTER — Other Ambulatory Visit: Payer: Self-pay

## 2018-04-20 DIAGNOSIS — F29 Unspecified psychosis not due to a substance or known physiological condition: Secondary | ICD-10-CM | POA: Diagnosis present

## 2018-04-20 LAB — VITAMIN B1, BLOOD: Vitamin B1: 107 nmol/L (ref 70–180)

## 2018-04-20 MED ORDER — NICOTINE 14 MG/24HR TD PT24
1.00 | MEDICATED_PATCH | Freq: Every day | TRANSDERMAL | 1 refills | Status: AC
Start: 2018-04-21 — End: 2018-05-21
  Filled 2018-04-20: qty 28, 28d supply, fill #0

## 2018-04-20 MED ORDER — ATORVASTATIN CALCIUM 40 MG OR TABS
40.00 mg | ORAL_TABLET | Freq: Every evening | ORAL | 1 refills | Status: AC
Start: 2018-04-20 — End: ?
  Filled 2018-04-20: qty 14, 14d supply, fill #0

## 2018-04-20 MED ORDER — PNEUMOCOCCAL VAC POLYVALENT 25 MCG/0.5ML IJ INJ (CUSTOM)
0.5000 mL | INJECTION | INTRAMUSCULAR | Status: DC
Start: 2018-04-20 — End: 2018-04-20
  Filled 2018-04-20: qty 0.5

## 2018-04-20 MED ORDER — LIDOCAINE 5 % EX PTCH
1.00 | MEDICATED_PATCH | CUTANEOUS | 1 refills | Status: AC
Start: 2018-04-21 — End: 2018-05-05
  Filled 2018-04-20: qty 14, 14d supply, fill #0

## 2018-04-20 MED ORDER — GABAPENTIN 300 MG OR CAPS
300.00 mg | ORAL_CAPSULE | Freq: Three times a day (TID) | ORAL | 1 refills | Status: DC
Start: 2018-04-20 — End: 2018-11-23
  Filled 2018-04-20: qty 42, 14d supply, fill #0

## 2018-04-20 MED ORDER — NICOTINE POLACRILEX 2 MG MT LOZG
2.0000 mg | LOZENGE | OROMUCOSAL | 1 refills | Status: AC | PRN
Start: 2018-04-20 — End: 2018-05-04
  Filled 2018-04-20: qty 20, 1d supply, fill #0

## 2018-04-20 MED ORDER — OLANZAPINE 10 MG OR TABS
10.00 mg | ORAL_TABLET | Freq: Three times a day (TID) | ORAL | 1 refills | Status: AC
Start: 2018-04-20 — End: 2018-05-04
  Filled 2018-04-20: qty 42, 14d supply, fill #0

## 2018-04-20 NOTE — Interdisciplinary (Signed)
[facility] Occup[facility] Therapy Discharge Summary [phone]) x 15 minutes    Functional Deficits:    Activities of Daily Living               instrumental Activities of Daily Living   Dressing: Mod A Shopping: Mod A   Eating: Mod I Medication Management: Max A   Ambulation: SBA Housekeeping: Min A   Sit to stand: SBA Accounting: Min A   Transfers: SBA Meal Preparation: Mod A   Toileting: Max A Transportation: Max A   Hygiene: Mod A Telephone: Mod I   Grooming: Mod A Bathing: Mod A       Dependent = Pt requires 100% assistance to complete task  Maximum assistance = Pt can complete up to 25% of the task without help   Moderate assistance = Pt can complete up to 50% of the task without help   Minimal assistance = Pt can complete up to 75% of the task without help   Contact Guard assistance = Pt requires occasional physical contact to maintain safety   Stand by assistance = Therapist must be standing by for safety, to complete task  Supervision/ Set Up = Pt needs verbal cues to complete task, due to safety concerns   Modified independent = Pt can complete task independently with devices or extra time  Independent = Pt does not need any verbal or physical assistance to complete task       Underlying Impairments:    Activity tolerance: Fair Thought process: Fair   Attention span: Good Sequencing: Good   Vision: Good Motor Planning: Good   Hearing: Good Problem Solving: Fair   Insight: Poor Orientation: Ox2 (person, location)   Judgement: Fair Affect: mood congruent      Standardized Assessments:   Barthel: 50/100 Get Up and Go: 3 (fall risk)   Allen Cognitive Level:  NT 30 Second Sit to Stand: Refused    MoCA (on admission) : Refused  Tinetti: NT   SLUMS: NT [place] iADL: 7/8 (max deficits)   Abnormal functional mobility or tone: Due to reported pain, patient prefers WC for propulsion on the unit. Patient is able to ambulate with FWW, though poses a fall risk due to back pain and poor LB strength and  coordination.       Based on Teepa Snows GEMSTM Brain Change model, pt is currently functioning as a(n): Emerald- Sees self as able and independent with limited awareness of changes in ability. Lives in moments of clarity mixed with periods of loss in logic/reason/perspective. Wants to do. Makes mistakes without supervision. Needs things to do. Needs help to fill the day. Forgets steps if not prompted at that time. Uses visual info. Gesture, point, offer. Dislikes help phrase. Understanding and use of language change; vague words and many repeats. Awareness of time, place and situation will not always match current reality. Likes friends & partners. Gets lost in time & place. Repeats doing over & over. Needs to know what comes next; seeks guidance and assistance to fill the day.         DISCHARGE CRITERIA:  - Patient will have no verbalizations of suicide ideations for 48consecutive hours (or two consecutive days).  - Appropriate aftercare plans will be established based on the patient's level of care needs, cognitive strengths and weaknesses, geographical location, and financial criteria.  - Patient will establish a stable medication regimen; no problem behaviors requiring medications for 48consecutive hours (or twoconsecutive days).    OT GOALS:  Short Term Occupational Therapy Goals to be achieved in two weeks by : 04/30/18  1.Pt will attend exercise group 1-2 x week to promote LB muscle strength and range of motion.-NOT MET  2. Pt will participate in at least 1 group per day without displaying agitation for 100% of group duration to increase emotional regulation.-MET    Long Term Occupational Therapy Goals to be achieved before discharge:   1.Pt's will identify and be independent in initiating 2 non-pharmacological interventions (I.e., deep breathing) to decrease paranoia associated with hallucinations.- MET  2. Pt will participate in at least 3 group per day without displaying agitation for 75% of group  duration to increase activity tolerance. -MET      Assessment of Target Symptoms: Patient actively participated in group sessions, though displayed poor emotional regulation and would become upset over various occurences during the day. Patient was noted yelling at peers and staff with foul language when irritated on multiple occasions. During breakfast this morning, patient became easily frustrated and threw her tray with food, displaying ongoing agitation. When patient was motivated to participate in groups of interest (I.e., bingo), she was able to control her agitation to a higher degree and remain quiet when frustrated in order to receive her prize at the end of the game. Patient reported that ways she can cope with anxiety and stress includes music as well as leaving the situation when frustrated. Patient displayed poor initiation of coping skills when her anxiety and stress increased, seen through her aggressive outbursts.       Discharge Recommendation/Inter-Professional Treatment Plan: Patient will be discharged to home environment in senior assisted living apartment with assistance provided with ADLS. Patient is recommended to partake in outpatient services at methadone clinic.       TODAY'S ACTIVITIES:   Community meeting [phone]) x 35 minutes  (Associative level - Group members have brief verbal or nonverbal interactions.)  Pt was alert, vocal and interactive in group        Majority of note was written by Sharma Covert, Occupational Therapy student (OTS)  Note has been reviewed and edited for grammatical and clinical assessment purposes by Markus Daft, MOT, OTR/L

## 2018-04-20 NOTE — Discharge Instructions (Signed)
Diagnosis and Reason for Admission    You were admitted to the hospital for the following reason(s): You were acting differently because your thoughts were not clear.    Your full diagnosis list is located on this After Visit Summary in the Hospital Problems section.      What Happened During Your Hospital Stay    The main tests and treatments done for you during this hospitalization were:    - Geriatric psychiatry and geriatric medicine evaluation and management  - Medication management    You have the following medical test/study results pending at discharge: None    The following evaluation is still important to complete after discharge from the hospital:  None      Instructions for After Discharge    Your diet at home should be a regular diet.    Your activity level at home should be:  regular activity.    Specific activity restrictions:    Do not drive while on narcotics    Other instructions:  None    Your medication list is located on this After Visit Summary in the Current Discharge Medication List section.  Your nurse will review this information with you before you leave the hospital.    It is very important for you to keep a current medication list with you in order to assist your doctors with your medical care.  Bring this After Visit Summary with you to your follow up appointments.      Reasons to Contact a Doctor Urgently    Call 911 or go to the nearest hospital Emergency Department immediately if:    - You have fever, chest pain, difficulty breathing, vomiting, diarrhea, have thoughts of suicide or homicide, or are seeing and hearing things.    You should contact your outpatient psychiatrist at [facility] or the [facility] [employer] [phone]) for any of the following reasons:   - If you have side effects from your medications, have worsening anxiety or depression, or if you have suicidal/homicidal thoughts    If you have any questions about your hospital care, your  pending results, your medications, or if you have new or concerning symptoms soon after going home from the hospital, and you need to contact your hospital psychiatrist, your hospital psychiatrist can be contacted at the [facility]) at [phone].    Once you are able to see outpatient psychiatrist, they will then be responsible for further medication refills, or appointment referrals.      What Needs to Happen Next After Discharge -- Appointments and Follow Up    Any appointments already scheduled at [facility] will be listed in the Scheduled Appointments section at the top of this After Visit Summary.  Any appointments that have been requested, but have not yet been scheduled, will be listed below that under Post Discharge Referrals.      Additional Information for You from your  Social Worker     Next level of care referral: Patient referred to next level of care provider      * You have a follow-up appointment at [facility], [address], [place], [place] [address] [phone] with your counselor Luisa Hart AOD, SUD on April 21, 2018 at 11:00 am. You will need to see your counselor prior to receiving your dosing.     * We are recommending you follow-up at the [facility] [phone] for  both primary and psychiatric care:    * You have an appointment scheduled with a primary care physician, Dr. Clayton BiblesStacey Little, on April 22, 2018 at 4:00pm [facility]. Please arrive at 3:45 for a check-in.     * You have an appointment in the Mental Health Clinic on May 13, 2018 at 10:00am at the [facility], [address] [place], [place] [id number]. If you are unable to make any of these appointments please call [phone].    Additional Information about Support Groups (if applicable)      Medical Home Information    Your primary care provider or clinic currently on file at [place] is: [facility]  You currently have an advance directive or living will on file at : Yes    Substance abuse referral: Your next level of care provider will manage your addictions treatment -[facility]    Additional Information for Alcohol and Substance Usage (if applicable)    - [facility] will manage your substance use after your discharge.    Your After Visit Summary (AVS) has been transmitted via fax [phone] and [phone] to your next provider, [facility]  on Tuesday, April 20, 2018.    Your After Visit Summary (AVS) has been given to you on Tuesday, April 20, 2018 at the time of discharge.    Your [facility] providers have access to your After Visit Summary (AVS) on EPIC.      Additional Information for You from your Occupational Therapist (if applicable)     Based on Teepa Snows GEMSTM Brain Change model, your are currently functioning as an:   Hospital doctorAmber -  All about sensation: seeks to satisfy desires and tries to avoid what is disliked. In [facility]. Environment can drive actions and reactions, without safety awareness. Help meet sensory needs. Visual abilities are limited. Physically intrusive. Possible low sensory tolerance. Busy fingers. Things in mouth. Fiddling & messing. Exploring their world. Has periods of intense activity; may be very curious or repetitive with objects or actions. Time alone/with others. Quiet space/Busy space. Upset during personal care tasks. Use Hand under Hand and resting hand approach. Back off & re approach when agitated. Care is refused or seen as threatening due to differences in perspective and ability.       Additional Information for You from your Discharging Nurse     It has been a pleasure caring for you during your hospital stay. Please call us should you have any questions, [phone].        Handouts Given to You (if applicable)    - A  Administrator, Civil Servicecommunity resource list  - a one day bus pass

## 2018-04-20 NOTE — Interdisciplinary (Signed)
Barthel Activities of Daily Living Index   Activity Score     FEEDING   0 = unable   5 = needs help cutting, spreading butter, etc., or requires modified diet   10 = independent     BATHING   0 = dependent   5 = independent (or in shower)     GROOMING   0 = needs to help with personal care   5 = independent face/hair/teeth/shaving (implements provided)     DRESSING   0 = dependent   5 = needs help but can do about half unaided   10 = independent (including buttons, zips, laces, etc.)     BOWELS   0 = incontinent (or needs to be given enemas)   5 = occasional accident   10 = continent      BLADDER   0 = incontinent, or catheterized and unable to manage alone   5 = occasional accident   10 = continent     TOILET USE   0 = dependent   5 = needs some help, but can do something alone   10 = independent (on and off, dressing, wiping)    TRANSFERS (BED TO CHAIR AND BACK)   0 = unable, no sitting balance   5 = major help (one or two people, physical), can sit   10 = minor help (verbal or physical)   15 = independent     MOBILITY (ON LEVEL SURFACES)   0 = immobile or < 50 yards   5 = wheelchair independent, including corners, > 50 yards   10 = walks with help of one person (verbal or physical) > 50 yards   15 = independent (but may use any aid; for example, stick) > 50 yards     STAIRS   0 = unable   5 = needs help (verbal, physical, carrying aid)   10 = independent      TOTAL (0-100): 50/100  0-20 Total ADL disability  25-45 Severe ADL disability  50-70 Moderate ADL disability  75-95 Slight ADL disability  100 No ADL impairment    At current score, pt would require moderate physical assistance for basic activities of daily living upon discharge. This score does not reflect level of cognitive assistance via visual, verbal or tactile cues.       1. The index should be used as a record of what a patient does, not as a record of what a patient could do.  2. The main aim is to establish degree of independence from any help,  physical or verbal, however minor and for whatever reason.  3. The need for supervision renders the patient not independent.  4. A patient's performance should be established using the best available evidence.   5. Usually the patient's performance over the preceding 24-48 hours is important, but occasionally longer periods will be relevant.  6. Middle categories imply that the patient supplies over 50 percent of the effort.  7. Use of aids to be independent is allowed.

## 2018-04-20 NOTE — Plan of Care (Addendum)
Problem: Promotion of Mental Health and Safety  Goal: The pt remains safe, receives appropriate treatment and achieves outcomes (psychologically, psychosocially, physically, and spiritually) within the limitations of the disease process by discharge.  Outcome: Not Progressing  Flowsheets  Taken 04/20/2018 0303 by Elam CityAquino, Maddalyn Lutze Dela Pena, RN  Guidelines: Inpatient Nursing Guidelines;Mental Health Assessment;Mood disorder;Thought disorder;Psychosis;Behavioral Issues  Individualized Interventions/Recommendations #1: Promote a good sleep hygiene.  Individualized Interventions/Recommendations #2: Maintain safety from fall and harm.  Individualized Interventions/Recommendations #3: Continue to provide limit settings.  Individualized Interventions/Recommendations #4: Assess pt mood and behavior.Intervene as indicated.  Taken 04/19/2018 1323 by Lou CalBaquirquir, Inocencia Olmos, RN  Individualized Interventions/Recommendations #5: Educate about her medications and encourage compliance with treatment  Note:   She was in her room, sleeping at the start of the shift. Quiet and calm. At 3 am she woke up to use the restroom and was wheeling herself in the common area by the nursing station asking for her PRN med. Pt was informed that she doesn't have a Gabapentin and Olanzapine ordered available but only Tylenol. Patient stated, "I know,May I have that Tylenol then? Yes I have pain 10/10 in my left shoulder. Patient was Given tylenol PRN which she claimed was helpful. Also provided a warm pack to her left arm. She was loud asking staff's non stop for hot water and tea. Was loud, try to get staff attention, but she was quiet and apologetic when ignored.  Pt is now calm sitting watching news in TV. Slept 3.75 hours.

## 2018-04-20 NOTE — Interdisciplinary (Signed)
Pt was d/c'd to her home with a taxi voucher and a one-day bus pass to go pick up her w/c.  She denied SI/HI, AVH. She had all of her belongings with her upon d/c and her medication from [facility] discharge pharmacy. AVS was reviewed to her understanding; pt signed her d/c paperwork. Staff escorted pt to the front of the hospital via w/c.

## 2018-04-20 NOTE — Progress Notes (Signed)
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     Target Symptoms: auditory hallucinations, mild disorganization, suicidal ideation     Discharge Criteria:   - Patient will have no verbalizations of suicide ideations for 48 consecutive hours (or two consecutive days).  - Appropriate aftercare plans will be established based on the patient's level of care needs, cognitive strengths and weaknesses, geographical location, and financial criteria.  - Patient will establish a stable medication regimen; no problem behaviors requiring medications for 48 consecutive hours (or two consecutive days).    Subjective:   Patient was seen and evaluated. Electronic medical records were reviewed including medication administration records and flow sheets. Patient was discussed in the interdisciplinary team meeting.     Per nursing report, patient slept 4.5 hours and consumed 100/100/100 percent of meals in the last 24 hours. Last bowel movement was on 7/27. Per nursing notes, she is cooperative and able to follow directions, loud and yells at times but responds well to redirection. Future oriented to picking up her dog from the humane society, making calls. She is focused on discharge. Vital signs are unremarkable.  No behavioral PRNs required over the last 24 hours as PRN medications were discontinued yesterday.     On interview this morning, the patient is initially upbeat and focused on discharge. Explained plan to her to have her follow up with psychiatry and with methadone  clinic, which she is agreeable to. Denied active SI, saying that she had a thought about strangling herself yesterday in the context of not feeling like her medication needs were being met, but it was just a thought that she has no intention of following through on. She says wants to live which is why However, over the course of the conversation, she becomes increasingly upset and labile, perseverating on how nobody gave her any medications and she only got 66m of olanzapine and 3018mof gabapentin all day. I chart reviewed and she received the medications appropriately as scheduled. She then becomes upset that nobody would give her chamomile tea last night and that nobody was doing their jobs. My continued attention to her appeared to be causing her to perseverate on perceived wrong-doings.     On group interview, patient states that she is ready to leave. [facility]     Family, Psychiatric, or Social History Updates:   No new updated. Patient continues to state she does not have any collateral to call.     Scheduled Medications:   buPROPion  300 mg Daily    lansoprazole  30 mg BID AC    nicotine  1 patch Daily    polyethylene glycol  17 g BID     PRN Medications:   acetaminophen  650 mg Q4H PRN 650 mg at 04/15/18 1730    bisacodyl  10 mg Daily PRN 10 mg at 04/15/18 1030    OLANZapine  2.5 mg TID PRN 2.5 mg at 04/16/18 0154     Review of Systems: 14 point review of systems  was negative, except as above    Objective:   04/15/18  1021 04/15/18  1635 04/15/18  1638 04/16/18  0600   BP: 114/85 115/61 105/78 136/88   Pulse: 60 56 80 54   Resp:  _0 Temp:  97.5 F (36.4 C)  98.1 F (36.7 C)   SpO2:  97% 97% 99%     Pain: Pain Score: 9    New Labs/Studies:     Lab Results   Component Value Date    WBC 7.9 04/14/2018    RBC 4.62 04/14/2018    HGB 12.7 04/14/2018    HCT 39.5 04/14/2018    MCV 85.5 04/14/2018    MCHC 32.2  04/14/2018    RDW 17.2 (H) 04/14/2018    PLT 273 04/14/2018    PLT 348 03/18/2007    MPV 9.2 (L) 04/14/2018     Lab Results   Component Value Date    BUN 17 04/14/2018    CREAT 0.87 04/14/2018    CL 109 (H) 04/14/2018    NA 145 04/14/2018    K 4.5 04/14/2018    Perkins 9.6 04/14/2018    TBILI 0.19 04/14/2018    ALB 4.1 04/14/2018    TP 6.8 04/14/2018    AST 22 04/14/2018    ALK 97 04/14/2018    BICARB 24 04/14/2018    ALT 35 (H) 04/14/2018    GLU 144 (H) 04/14/2018     Lab Results   Component Value Date    A1C 5.3 04/15/2018     Lab Results   Component Value Date    TSH 2.53 04/14/2018     Lab Results   Component Value Date    CHOL 158 06/13/2012    HDL 62 06/13/2012    LDLCALC 79 06/13/2012    TRIG 85 06/13/2012     Mental Status Examination:    Appearance: Overweight, who appears stated age.  She is dressed in casual street clothing, seated in a wheelchair wheeling herself around the unit  Behavior: Pleasant, cooperative initially, then becomes demanding and irritated.  Motor / abnormal involuntary movements: No psychomotor agitation or retardation noted.  Gait: Uses wheelchair independently, able to use lower extremities to propel herself around the unit  Speech: Normal volume, rate, tone  Mood: "I want to go home"  Affect: Labile, initially euthymic then angry and distraught  Thought process: coherent, superficially linear, goal-directed  Thought content: No overt psychosis, no paranoia, grandiosity. Denies current suicidal and homicidal ideation, intent and plan.   Perceptions: Does not appear to be responding to internal stimuli but endorses auditory hallucinations, no visual hallucinations  Insight / judgment: limited / poor  Sensorium / orientation / intellectual functions: Alert and oriented to self, place, time, situation    Montreal cognitive assessment   Refused on 04/15/18    Neuroimaging  CT STROKE ANGIOGRAPHY HEAD AND NECK   04/07/2018:  1. No evidence of intracranial hemorrhage or mass lesion, mass effect  or midline shift, herniation or hydrocephalus, or extra-axial fluid collection. No evidence of acute transcortical infarct. No hyperdense vessel sign.  2. Essentially non-diagnostic CTA head and neck due to premature contrast bolus timing related to extensive venous collaterals within the neck and upper chest.    MRI BRAIN W/O CONTRAST  04/07/2018:  Study quality degraded by patient motion on most sequences.  No acute intracranial findings. Specifically, no acute infarct.    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    Patient has been coherent, superficially linear, and not responding to internal stimuli while on the unit. While irritable and with poor frustration tolerance, her behavior has been non-agitated and organized, which is marked change from her presentation prior to [facility] admission. She has been receiving olanzapine and gabapentin with some apparent improvement in psychotic features. She continues to have poor frustration tolerance and displays medication seeking behavior, often become demanding and insulting in her attempts to obtain additional PRNs. In the last 24 hours, she has continued to be unhappy with her medication regimen despite and has utilized behavioral outbursts as a method of obtaining medications. At this time, her SI and psychotic symptoms have been stabilized and she is future oriented and  voices multiple protective factors, including her grandchildren, desire to engage in medical and psychiatric treatment, responsibility to pets. She is at low acute risk of suicide currently but is chronically at elevated risk due to substance use and poor coping skills. Further inpatient care would not modify her chronic risk factors and we have encouraged her to follow up as an outpatient for substance dependence treatment and psychiatric follow up as she would benefit from stable psychiatric care and therapy. Appointments have been scheduled for her and she was notified.     Active Problem List:  Active Hospital Problems    Diagnosis    *Major depressive disorder    Suicidal ideation    Vitamin D deficiency    Chronic abdominal pain    Amphetamine abuse (CMS-HCC)    Opioid abuse (CMS-HCC)    HTN (hypertension)    HLD (hyperlipidemia)       Plan:  A.  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 Pt aware.   - Continue gabapentin 300 mg TID for pain/anxiety (added 7/28)  - SW has provided patient with follow up appointments and community resources    #Opiate use disorder  -We have discussed patient's pain needs and encouraged her [facility].  - [facility] As per [facility] and pharmacy, no recent prescription for buprenorphine     # Tobacco use disorder  - Nicotine patch 111m24 hours  -Discussed risk of continued smoking with patient and offered her resources for cessation    B.  Medical:    1) Chronic pain  syndrome with low back and abdominal pain; opiate dependence: No objective signs of opiate withdrawal.   - Dicyclomine PRN has been discontinued as this is highly anticholinergic and  to be avoided in the elderly.  - Discontinue loperamide PRN due to lack of objective evidence of diarrhea  - Continue lidocaine patch 5% daily to low back    2) Constipation: Miralax was discontinued on 04/16/18 due to report of diarrhea, has not had a bowel movement since 04/17/18.  She is reluctant to resume laxatives  - Discontinue loperamide PRN.    - Bisacodyl 10 mg rectal daily PRN constipation  - If no bowel movement by 04/20/18 can resume Miralax 17 g daily    3) Essential hypertension: Her BP remains at goal <150/90 without need for pharmacotherapy    4) Hyperlipidemia: ASCVD risk 18.7%, will start atorvastatin 40 mg daily    5) Vitamin D deficiency: Continue cholecalciferol 2000 units daily     C.  Psychosocial: Group and milieu therapy    D.  Legal: Voluntary as of 04/15/18    E.  Disposition: To home today    This patient and her care were discussed with the attending psychiatrist, Dr. Stevphen Rochester R. Manuella Ghazi, MD, PhD  PGY-2, Psychiatry

## 2018-05-03 LAB — SMOKING OR VAPING CESSATION - KICK IT ~~LOC~~

## 2018-05-09 ENCOUNTER — Other Ambulatory Visit: Payer: Self-pay

## 2018-06-27 ENCOUNTER — Other Ambulatory Visit (INDEPENDENT_AMBULATORY_CARE_PROVIDER_SITE_OTHER): Payer: Self-pay | Admitting: Physician Assistant

## 2018-06-27 DIAGNOSIS — G894 Chronic pain syndrome: Principal | ICD-10-CM

## 2018-07-05 ENCOUNTER — Telehealth (HOSPITAL_BASED_OUTPATIENT_CLINIC_OR_DEPARTMENT_OTHER): Payer: Self-pay

## 2018-07-05 NOTE — Telephone Encounter (Signed)
Following up on prev notes below. I tried to call pt again,& was informed that [phone] is no longer her #. We have no # to reach pt currently I have added a note to the system so we can obtain one when she calls back in. I have mailed pt a letter w/ our contact info for when she is ready to schd her Vascular Surg Consult.  2nd & 3rd ATTEMPTS    (prev notes)  Following up on prev notes below. I rece reply from NP's Triage below. I see pt is now admitted bed 719. I called pts cell [phone] per her prev req to let her know we can now schd Vascular Surg Consult [facility] [place] [place]. If pt does end up wanting [facility] she can also schd w/ Dr. Janan Ridge. I had to LMM. If pt calls back she is ok to schd.  1st ATTEMPT      Eston Esters, NP  You 14 minutes ago (9:02 AM)      Ok to be seen for AAA    Routing comment      (prev notes)  Following up on prev notes below. I rece a call from the call center informing pt was calling to schd. I requested they inform pt that the ER MD has now signed the order but our NP still needs to finish her Triage of order 1st before we can schd. Per rep they will let her know.    (prev notes)  Following up on prev notes below. I rece reply below & can now see referral now & see it is listed as the wrong dept MON I have updated to [facility] & sent request to NP as STAT to Triage. Once done I will call pt back to get her schd in [place] per her request.    I found the option to sign the referral. Please let me know if there is any further needs.         Documentation       Carlile, Gaynelle Arabian, MD Yesterday (12:06 AM)         I attempted to sign the referral to Vascular surgery tonight but a system error prevented this. It was pended as a discharge order for the discharge physician to sign as I signed out the patient to a new ED MD team prior to discharge. Now the system will not allow me to sign the order as we are now after 96 hours post discharge. Please advise how I  may help.         Documentation      (prev notes)  Following up on prev notes below. I called pt & LMM for her w/status of her order & as soon as her ER MD signs the order & our NP Wilkie Aye has Triaged I will call her back to get her schd asap per her request. (pt prefers [employer])    (prev notes)  Following up on prev notes below.I rece a reply from [facility] informing the order is not showing since it was not signed. It is in Epic under orders due to this as pending signature fromCarlile, Gaynelle Arabian, MD. I tried to call the # listed for [phone] had to [facility]. I have forwarded Epic Staff message to MD to please sign the order ASAP since pt wants to schd. I will reach out to pt later in the a/m & give her a status. Pending reply from MD.    (  prev notes)  [name] a call from pt wanting to schd her Vascular Surg Consult ASAP. Per pt she was told when she went ot the [place] ER 7/16 that she had a AAA & needed to be seen asap. I looked & do not reflect that a order was sent over to [place]. Per pt it may have been sent to [facility] but she does not want to go there. I informed pt I would be able to see the order if it had been placed for [facility] since we schd for both. I informed pt I would contact my Sup & conf how to get a copy of this order since I know the ER MD's can be hard to reach. Per pt her cell was lost recently so she is not avail @ that # asked that I call her back on [phone]. Pending reply & reecipt of order I will have NP Triage as STAT & call pt to schd.

## 2018-11-23 ENCOUNTER — Encounter (INDEPENDENT_AMBULATORY_CARE_PROVIDER_SITE_OTHER): Payer: Self-pay | Admitting: Physician Assistant

## 2018-11-23 ENCOUNTER — Ambulatory Visit (INDEPENDENT_AMBULATORY_CARE_PROVIDER_SITE_OTHER): Admitting: Physician Assistant

## 2018-11-23 VITALS — BP 90/53 | HR 80 | Temp 97.0°F | Resp 13 | Wt 223.0 lb

## 2018-11-23 DIAGNOSIS — J40 Bronchitis, not specified as acute or chronic: Principal | ICD-10-CM

## 2018-11-23 DIAGNOSIS — I10 Essential (primary) hypertension: Secondary | ICD-10-CM

## 2018-11-23 DIAGNOSIS — M797 Fibromyalgia: Secondary | ICD-10-CM

## 2018-11-23 DIAGNOSIS — G894 Chronic pain syndrome: Secondary | ICD-10-CM

## 2018-11-23 LAB — INFLUENZA A&B TEST POCT: Influenza A/B Combined: NEGATIVE

## 2018-11-23 MED ORDER — CLONIDINE 0.2 MG/24HR TD PTWK
MEDICATED_PATCH | TRANSDERMAL | Status: AC
Start: 2018-10-04 — End: ?

## 2018-11-23 MED ORDER — LYRICA 200 MG OR CAPS
200.0000 mg | ORAL_CAPSULE | Freq: Two times a day (BID) | ORAL | 3 refills | Status: DC
Start: 2018-11-23 — End: 2019-05-03

## 2018-11-23 MED ORDER — MUCINEX 600 MG OR TB12
1200.0000 mg | ORAL_TABLET | Freq: Two times a day (BID) | ORAL | 0 refills | Status: AC
Start: 2018-11-23 — End: 2018-12-03

## 2018-11-23 MED ORDER — GABAPENTIN 300 MG OR CAPS
300.0000 mg | ORAL_CAPSULE | Freq: Three times a day (TID) | ORAL | 1 refills | Status: DC
Start: 2018-11-23 — End: 2018-11-24

## 2018-11-23 MED ORDER — PREGABALIN 200 MG OR CAPS
200.00 mg | ORAL_CAPSULE | Freq: Every day | ORAL | Status: DC
Start: ? — End: 2018-11-23

## 2018-11-24 ENCOUNTER — Telehealth (INDEPENDENT_AMBULATORY_CARE_PROVIDER_SITE_OTHER): Payer: Self-pay | Admitting: Physician Assistant

## 2018-11-24 DIAGNOSIS — M5136 Other intervertebral disc degeneration, lumbar region: Principal | ICD-10-CM

## 2018-12-27 ENCOUNTER — Telehealth (INDEPENDENT_AMBULATORY_CARE_PROVIDER_SITE_OTHER): Payer: Self-pay

## 2018-12-27 NOTE — Telephone Encounter (Signed)
PA Approved. Please notify patient and pharmacy.

## 2018-12-27 NOTE — Telephone Encounter (Signed)
[employer] cxl'd PA because they said rx is available without PA.  Called the pharmacy and they reprocessed the rx and its still coming back as rejected. Will call [employer] to see what the problem is.     PH#:[phone]     EOS IV:[id number]    Submitted PA over the phone with [facility] as Urgent 24 hour turn around time.

## 2018-12-27 NOTE — Telephone Encounter (Signed)
Submitted PA as urgent to [facility].

## 2019-01-18 ENCOUNTER — Other Ambulatory Visit
Admission: RE | Admit: 2019-01-18 | Discharge: 2019-01-18 | Disposition: A | Discharge status: Home / Self Care | Payer: Medicare Other | Attending: Family Medicine | Admitting: Family Medicine

## 2019-01-18 DIAGNOSIS — Z1159 Encounter for screening for other viral diseases: Secondary | ICD-10-CM | POA: Insufficient documentation

## 2019-01-19 ENCOUNTER — Other Ambulatory Visit: Admit: 2019-01-19 | Payer: Medicare Other

## 2019-01-19 LAB — COVID-19 CORONAVIRUS DETECTION ASSAY AT ~~LOC~~ LAB: COVID-19 Coronavirus Result: NOT DETECTED

## 2019-02-22 ENCOUNTER — Ambulatory Visit (INDEPENDENT_AMBULATORY_CARE_PROVIDER_SITE_OTHER): Admitting: Physician Assistant

## 2019-03-26 ENCOUNTER — Emergency Department (EMERGENCY_DEPARTMENT_HOSPITAL): Payer: Medicare Other

## 2019-03-26 ENCOUNTER — Encounter (HOSPITAL_COMMUNITY): Payer: Self-pay | Admitting: Otolaryngology

## 2019-03-26 ENCOUNTER — Emergency Department
Admission: EM | Admit: 2019-03-26 | Discharge: 2019-03-27 | Disposition: A | Discharge status: Home / Self Care | Payer: Medicare Other | Attending: Pain Medicine | Admitting: Pain Medicine

## 2019-03-26 ENCOUNTER — Emergency Department (HOSPITAL_BASED_OUTPATIENT_CLINIC_OR_DEPARTMENT_OTHER): Payer: Medicare Other

## 2019-03-26 DIAGNOSIS — Z888 Allergy status to other drugs, medicaments and biological substances status: Secondary | ICD-10-CM | POA: Insufficient documentation

## 2019-03-26 DIAGNOSIS — F319 Bipolar disorder, unspecified: Secondary | ICD-10-CM | POA: Insufficient documentation

## 2019-03-26 DIAGNOSIS — Z818 Family history of other mental and behavioral disorders: Secondary | ICD-10-CM | POA: Insufficient documentation

## 2019-03-26 DIAGNOSIS — Z1159 Encounter for screening for other viral diseases: Secondary | ICD-10-CM | POA: Insufficient documentation

## 2019-03-26 DIAGNOSIS — R918 Other nonspecific abnormal finding of lung field: Secondary | ICD-10-CM

## 2019-03-26 DIAGNOSIS — I10 Essential (primary) hypertension: Secondary | ICD-10-CM | POA: Insufficient documentation

## 2019-03-26 DIAGNOSIS — R071 Chest pain on breathing: Secondary | ICD-10-CM | POA: Insufficient documentation

## 2019-03-26 DIAGNOSIS — Z90710 Acquired absence of both cervix and uterus: Secondary | ICD-10-CM | POA: Insufficient documentation

## 2019-03-26 DIAGNOSIS — J449 Chronic obstructive pulmonary disease, unspecified: Secondary | ICD-10-CM | POA: Insufficient documentation

## 2019-03-26 DIAGNOSIS — Z9884 Bariatric surgery status: Secondary | ICD-10-CM | POA: Insufficient documentation

## 2019-03-26 DIAGNOSIS — E119 Type 2 diabetes mellitus without complications: Secondary | ICD-10-CM | POA: Insufficient documentation

## 2019-03-26 DIAGNOSIS — R001 Bradycardia, unspecified: Secondary | ICD-10-CM | POA: Insufficient documentation

## 2019-03-26 DIAGNOSIS — E669 Obesity, unspecified: Secondary | ICD-10-CM | POA: Insufficient documentation

## 2019-03-26 DIAGNOSIS — K449 Diaphragmatic hernia without obstruction or gangrene: Secondary | ICD-10-CM

## 2019-03-26 DIAGNOSIS — E785 Hyperlipidemia, unspecified: Secondary | ICD-10-CM | POA: Insufficient documentation

## 2019-03-26 DIAGNOSIS — R079 Chest pain, unspecified: Secondary | ICD-10-CM

## 2019-03-26 DIAGNOSIS — R0689 Other abnormalities of breathing: Secondary | ICD-10-CM | POA: Insufficient documentation

## 2019-03-26 DIAGNOSIS — I719 Aortic aneurysm of unspecified site, without rupture: Secondary | ICD-10-CM | POA: Insufficient documentation

## 2019-03-26 DIAGNOSIS — I714 Abdominal aortic aneurysm, without rupture: Secondary | ICD-10-CM

## 2019-03-26 DIAGNOSIS — Z8249 Family history of ischemic heart disease and other diseases of the circulatory system: Secondary | ICD-10-CM | POA: Insufficient documentation

## 2019-03-26 DIAGNOSIS — R55 Syncope and collapse: Secondary | ICD-10-CM

## 2019-03-26 DIAGNOSIS — Z885 Allergy status to narcotic agent status: Secondary | ICD-10-CM | POA: Insufficient documentation

## 2019-03-26 DIAGNOSIS — Z9989 Dependence on other enabling machines and devices: Secondary | ICD-10-CM | POA: Insufficient documentation

## 2019-03-26 DIAGNOSIS — K228 Other specified diseases of esophagus: Secondary | ICD-10-CM

## 2019-03-26 DIAGNOSIS — Z8679 Personal history of other diseases of the circulatory system: Secondary | ICD-10-CM

## 2019-03-26 DIAGNOSIS — F209 Schizophrenia, unspecified: Secondary | ICD-10-CM | POA: Insufficient documentation

## 2019-03-26 DIAGNOSIS — F17210 Nicotine dependence, cigarettes, uncomplicated: Secondary | ICD-10-CM | POA: Insufficient documentation

## 2019-03-26 HISTORY — DX: Chronic obstructive pulmonary disease, unspecified (CMS-HCC): J44.9

## 2019-03-26 HISTORY — DX: Aortic aneurysm of unspecified site, without rupture (CMS-HCC): I71.9

## 2019-03-26 LAB — URINALYSIS WITH CULTURE REFLEX, WHEN INDICATED
Bilirubin: NEGATIVE
Blood: NEGATIVE
Glucose: NEGATIVE
Ketones: NEGATIVE
Leuk Esterase: NEGATIVE Leu/uL
Nitrite: NEGATIVE
Protein: NEGATIVE
Specific Gravity: 1.018 (ref 1.002–1.030)
Urobilinogen: NEGATIVE
pH: 6.5 (ref 5.0–8.0)

## 2019-03-26 LAB — VENOUS BLOOD GAS
BE, Ven: 3.7 mmol/L — ABNORMAL HIGH (ref <=2.3)
FIO2, Ven: 21 %
HCO3, Ven: 27 mmol/L (ref 23–28)
O2 Sat, Ven (Est): 66.6 %
Temp, Ven: 36.7 'C
pCO2, Ven (T): 48 mmHg (ref 40–52)
pCO2, Ven (Uncorr): 49 mmHg (ref 40–52)
pH, Ven (T): 7.39 (ref 7.33–7.40)
pH, Ven (Uncorr): 7.39 (ref 7.33–7.40)
pO2, Ven (T): 34 mmHg (ref 25–44)
pO2, Ven (Uncorr): 35 mmHg (ref 25–44)

## 2019-03-26 LAB — COMPREHENSIVE METABOLIC PANEL, BLOOD
ALT (SGPT): 18 U/L (ref 0–33)
AST (SGOT): 34 U/L — ABNORMAL HIGH (ref 0–32)
Albumin: 3.7 g/dL (ref 3.5–5.2)
Alkaline Phos: 77 U/L (ref 35–140)
Anion Gap: 8 mmol/L (ref 7–15)
BUN: 11 mg/dL (ref 8–23)
Bicarbonate: 23 mmol/L (ref 22–29)
Bilirubin, Tot: 0.2 mg/dL (ref <1.2)
Calcium: 8.6 mg/dL (ref 8.5–10.6)
Chloride: 104 mmol/L (ref 98–107)
Creatinine: 0.79 mg/dL (ref 0.51–0.95)
GFR: >60 mL/min
Glucose: 80 mg/dL (ref 70–99)
Potassium: 4.6 mmol/L (ref 3.5–5.1)
Sodium: 135 mmol/L — ABNORMAL LOW (ref 136–145)
Total Protein: 6 g/dL (ref 6.0–8.0)

## 2019-03-26 LAB — MAGNESIUM, BLOOD: Magnesium: 2.1 mg/dL (ref 1.6–2.4)

## 2019-03-26 LAB — PRO BNP, BLOOD: BNPP: 163 pg/mL (ref 0–899)

## 2019-03-26 LAB — LIPASE, BLOOD: Lipase: 13 U/L (ref 13–60)

## 2019-03-26 LAB — CBC WITH DIFF, BLOOD
ANC-Automated: 4.9 10*3/uL (ref 1.6–7.0)
Abs Basophils: 0 10*3/uL (ref <0.1)
Abs Eosinophils: 0.4 10*3/uL (ref 0.1–0.5)
Abs Lymphs: 1.9 10*3/uL (ref 0.8–3.1)
Abs Monos: 0.7 10*3/uL (ref 0.2–0.8)
Basophils: 0 %
Eosinophils: 5 %
Hct: 36.2 % (ref 34.0–45.0)
Hgb: 11.8 gm/dL (ref 11.2–15.7)
Lymphocytes: 24 %
MCH: 26.5 pg (ref 26.0–32.0)
MCHC: 32.6 g/dL (ref 32.0–36.0)
MCV: 81.3 um3 (ref 79.0–95.0)
MPV: 8.9 fL — ABNORMAL LOW (ref 9.4–12.4)
Monocytes: 9 %
Plt Count: 292 10*3/uL (ref 140–370)
RBC: 4.45 10*6/uL (ref 3.90–5.20)
RDW: 16.8 % — ABNORMAL HIGH (ref 12.0–14.0)
Segs: 62 %
WBC: 7.9 10*3/uL (ref 4.0–10.0)

## 2019-03-26 LAB — TSH, BLOOD: TSH: 1.14 u[IU]/mL (ref 0.27–4.20)

## 2019-03-26 LAB — PHOSPHORUS, BLOOD: Phosphorous: 3.3 mg/dL (ref 2.7–4.5)

## 2019-03-26 LAB — AMMONIA, PLASMA: Ammonia: 46 umol/L (ref 16–60)

## 2019-03-26 LAB — TROPONIN T GEN 5 W/REFLEX TO CK/CKMB: Troponin T Gen 5 w/Reflex CK/CKMB: <6 ng/L (ref <14)

## 2019-03-26 LAB — LITHIUM, BLOOD: Lithium: <0.1 mmol/L — ABNORMAL LOW (ref 0.6–1.2)

## 2019-03-26 MED ORDER — ASPIRIN 81 MG OR CHEW
324.0000 mg | CHEWABLE_TABLET | Freq: Once | ORAL | Status: DC
Start: 2019-03-26 — End: 2019-03-26

## 2019-03-26 MED ORDER — IOHEXOL 350 MG/ML IV SOLN
110.0000 mL | Freq: Once | INTRAVENOUS | Status: AC
Start: 2019-03-26 — End: 2019-03-26
  Administered 2019-03-26: 21:00:00 110 mL via INTRAVENOUS
  Filled 2019-03-26: qty 110

## 2019-03-26 MED ORDER — NALOXONE HCL 0.4 MG/ML IJ SOLN
0.2000 mg | Freq: Once | INTRAMUSCULAR | Status: DC
Start: 2019-03-26 — End: 2019-03-27
  Filled 2019-03-26: qty 1

## 2019-03-26 MED ORDER — METHOCARBAMOL 500 MG OR TABS
ORAL_TABLET | ORAL | Status: DC
Start: 2015-10-15 — End: 2019-05-03

## 2019-03-26 NOTE — ED Notes (Signed)
Pt is already connected to cardiac and vital monitoring.   EKG already complete and blood sent by resource RN.  Dr Gwynneth Aliment assessed the patient.

## 2019-03-26 NOTE — ED Notes (Signed)
Dr. Oswald at bedside

## 2019-03-26 NOTE — EMS Narrative (Addendum)
Pt Age: 63 Years; Gender: Female; Is there any reason to suspect patient   may HAVE or may have been EXPOSED to COVID-19: No; Provide specific details   for suspecting patient may HAVE or have been EXPOSED to COVID-19: ;  Primary Impression: Chest Pain (cardiac);  Medical History: Resp:  COPD; Pt Medication: ; Pt Allergies: (No Known Drug   Allergies), ;  S/S: shortness of breath, dizziness; Date/Time: 03/26/2019 14:55; Mental   Status: Normal Baseline for Patient; Neuro: Normal Baseline for Patient;   Eye Lt: 3-mm, PERRL; Eye Rt: 3-mm, PERRL;     Crew: Marlou Porch; Date/Time: 03/26/2019 15:09; Prior Care: No; Medication   Given: Normal Saline; Route: Intravenous (IV); Dosage: 250; Units:   Milliliters (ml); Response: Improved; Role/Type of Person Administering   Medication: EMT-Paramedic; Authorization: SO - Standing Order;   Crew: Romero Liner; Date/Time: 03/26/2019 14:54:26; Prior Care: Yes;   Medication Given: Aspirin; Route: Oral; Dosage: 324; Units: Milligrams   (mg); Role/Type of Person Administering Medication: EMT-Paramedic;     Base Called: [place]    CC:    HPI:  Primary Symptom: Chest pain, unspecified  Other Associated Symptoms: Dizziness and giddiness  Other Associated Symptoms: Shortness of breath  Provider's Primary Impression: Angina pectoris, unspecified  Initial Patient Acuity: Emergent (Yellow)    Alert:  Patient Care Report Number: [id number]  Incident Number: [id number]  EMS Vehicle (Unit) Number: [place]  EMS Unit Call Sign: [id number]  Level of Care of This Unit: ALS-Paramedic  Incident Location Type: [place] place  Incident Street Address: [place]: [id number]  Incident ZIP Code: [phone]    Assessment:  Heart Assessment: Normal  Mental Status Assessment: Normal Baseline for Patient    Procedure - Arrest:  Cardiac Arrest: No    Procedure - Exam:    Procedure - Injury:    Procedure - Airway:    Procedure - Medications:  Date/Time Medication Administered:  2020-07-04T14:54:26.000-07:00  Medication Administered Prior to this Unit's EMS Care: Yes  Medication Given: 1191  Medication Administered Route: Oral  Medication Dosage: 324  Medication Dosage Units: Milligrams (mg)  Date/Time Medication Administered: 2020-07-04T15:09:00.000-07:00  Medication Administered Prior to this Unit's EMS Care: No  Medication Given: 9863  Medication Administered Route: Intravenous (IV)  Medication Dosage: 250  Medication Dosage Units: Milliliters (ml)  Response to Medication: Improved    Procedure - Generic:  Date/Time Procedure Performed: 2020-07-04T14:49:00.000-07:00  Procedure Performed Prior to this Unit's EMS Care: Yes  Procedure: [id number]  Date/Time Procedure Performed: 2020-07-04T14:49:00.000-07:00  Procedure Performed Prior to this Unit's EMS Care: Yes  Procedure: [id number]  Date/Time Procedure Performed: 2020-07-04T14:49:00.000-07:00  Procedure Performed Prior to this Unit's EMS Care: Yes  Procedure: [id number]  Date/Time Procedure Performed: 2020-07-04T14:56:59.000-07:00  Procedure Performed Prior to this Unit's EMS Care: Yes  Procedure: [id number]  Date/Time Procedure Performed: 2020-07-04T14:56:59.000-07:00  Procedure Performed Prior to this Unit's EMS Care: No  Procedure: [id number]  Date/Time Procedure Performed: 2020-07-04T15:05:00.000-07:00  Procedure Performed Prior to this Unit's EMS Care: No  Procedure: [id number]  Vascular Access Location: Hand-Right  Date/Time Procedure Performed: 2020-07-04T15:05:00.000-07:00  Procedure Performed Prior to this Unit's EMS Care: No  Procedure: [id number]    Demographics History:    Demographics Practitioner:    Demographics Patient:  Last Name: Lemus  First Name: [facility]  Patient's Home Address: [place]: [id number]  [place]: [phone]  Patient's Home State: 06  Patient's Home ZIP Code: [place] of Residence:  [place]  Gender: Female  Race: Black or African American  Age: [age]  Age Units:  Years  Patient's Phone Number: [phone]    Demographics Times:  Unit Notified by Dispatch Date/Time: 2020-07-04T14:52:12.000-07:00  Unit En Route Date/Time: 2020-07-04T14:52:24.000-07:00  Unit Arrived on Scene Date/Time: 2020-07-04T14:57:48.000-07:00  Arrived at Patient Date/Time: 2020-07-04T14:46:56.000-07:00  Unit Left Scene Date/Time: 2020-07-04T15:25:15.000-07:00  Patient Arrived at Destination Date/Time: 2020-07-04T15:55:11.000-07:00    Demographics Payment:  Primary Method of Payment: Self Pay

## 2019-03-26 NOTE — ED Notes (Signed)
Lab called, blue top sample is too short. Informed Dr Gwynneth Aliment that the coags need to be reordered if he wants them. At this time, provider does not want a redraw

## 2019-03-26 NOTE — ED Notes (Signed)
covid swab walked to lab

## 2019-03-26 NOTE — ED Provider Notes (Signed)
Emergency Department Note  [facility] electronic medical record reviewed for pertinent medical history.     Nursing Triage Note:   Chief Complaint   Patient presents with   . Chest Pain - Adult     [facility] pt has had left sided chest pain radiating to left arm since yesterday, while in back of ambulance pt began to have all over chest pressure and became hypotensive 80s then 89/56 bradycardic 51, 250CC bolus --> 112/77 / 98.2 / 96% RA. fsbs 106. hx of COPD       HPI:   63 year old female with a PMH significant for COPD, diabetes presents with intermitted chest pain/pressure since yesterday.    [facility]. Found to be 89/56, bradycardic, given 250cc bolus en route with normalization of pressures.  She reports that the pain has been intermittent since yesterday AM, with near syncopal episode. Denies LOC or fall.   Pain lasts up to 30 seconds before returning to baseline, pleuritic in nature, pressure, radiates to middle of back, "sharp" feeling at left shoulder, right hand tingling, and a dull headache. Pain is brought on by talking. Patient appears somnolent but denies sleepiness.    She reports taking ASA 325 x 2 this AM which relieved the pain.  EMS gave ASA 324 x1 en route, which she stated relieved the pain. .    Patient is diabetic, does not check sugar at home. Patient has COPD does not use any inhalers.  Denies nausea/vomiting, change in vision, GI/GU disturbance, falls, LOC.    Upon further history, patient reports taking 6x benadryl today for 'allergies'. Patient refusing Narcan for concern it "almost gave her a stroke" in the past    Chart review shows patient presented to [facility] in January 2020 appearing altered, disoriented, and making strange statements.     Past Medical History:   Diagnosis Date   . Amphetamine abuse (CMS-HCC)    . Aortic aneurysm (CMS-HCC)    . Bipolar 1 disorder (CMS-HCC)    . Bipolar affective (CMS-HCC)    .  COPD (chronic obstructive pulmonary disease) (CMS-HCC)    . Diabetes mellitus (CMS-HCC)    . Fracture, intertrochanteric, left femur (CMS-HCC)    . Gastric bypass status for obesity    . H/O: hysterectomy    . HLD (hyperlipidemia)    . HTN (hypertension)    . Manic depression (CMS-HCC)    . Obesity    . Opioid abuse (CMS-HCC)    . Polyarthropathy or polyarthritis of multiple sites    . Schizophrenia (CMS-HCC)        Past Surgical History:   Procedure Laterality Date   . GASTRIC BYPASS     . hx gastric bypass     . s/p IMN left IT fracture 07/20/16     . STOMACH SURGERY     . Ventral Hernia repair         Family History:    Family History   Problem Relation Name Age of Onset   . Schizophrenia Mother     . Hypertension Mother         Social History:  -tob, -EtOH, lives with family at [facility] shelter in [place].    Social History     Tobacco Use   . Smoking status: Current Every Day Smoker     Packs/day: 1.00     Types: Cigarettes   . Smokeless tobacco: Never  Used   Substance Use Topics   . Alcohol use: Yes     Alcohol/week: 0.0 standard drinks   . Drug use: Yes     Types: Methamphetamines, Marijuana       Medications:   Prior to Admission Medications   Prescriptions Last Dose Informant Patient Reported? Taking?   LYRICA 200 MG capsule   No No   Sig: Take 1 capsule (200 mg) by mouth 2 times daily.   atorvastatin (LIPITOR) 40 MG tablet   No No   Sig: Take 1 tablet (40 mg) by mouth every evening.   cloNIDine (CATAPRES) 0.2 MG/24HR patch   Yes No      Facility-Administered Medications: None       Allergies: Compazine; Gabapentin; Haldol [haloperidol]; Ondansetron; Reglan [metoclopramide]; Ultram [tramadol]; Zofran [ondansetron]; and Toradol    Review of Systems:   Review of Systems   HENT: Negative.    Eyes: Negative.  Negative for visual disturbance.   Respiratory: Positive for chest tightness. Negative for cough.    Cardiovascular: Positive for chest pain.   Gastrointestinal: Negative.  Negative for nausea  and vomiting.   Genitourinary: Negative.    Musculoskeletal: Negative.    Neurological: Positive for weakness.     All other systems reviewed and negative unless otherwise noted in the HPI or above. This was done per my custom and practice for systems appropriate to the chief complaint in an emergency department setting and varies depending on the quality of history that the patient is able to provide.      Physical Exam:   03/26/19  1600 03/26/19  1615 03/26/19  1745   BP: 105/89 143/77 136/65   Pulse: 51 63 61   Resp: _0 Temp: 98.1 F (36.7 C)     SpO2: 96% 97% 99%     Nursing note and vitals reviewed.     Physical Exam  Vitals signs reviewed.   Constitutional:       Appearance: She is obese.   HENT:      Head: Normocephalic and atraumatic.   Eyes:      Extraocular Movements: Extraocular movements intact.      Pupils: Pupils are equal, round, and reactive to light.      Comments: Arcus senilis   Neck:      Musculoskeletal: Normal range of motion and neck supple.   Cardiovascular:      Rate and Rhythm: Regular rhythm. Bradycardia present.      Pulses:           Carotid pulses are 2+ on the right side and 2+ on the left side.       Radial pulses are 2+ on the right side and 2+ on the left side.        Dorsalis pedis pulses are 2+ on the right side and 2+ on the left side.        Posterior tibial pulses are 2+ on the right side and 2+ on the left side.      Heart sounds: Normal heart sounds.   Pulmonary:      Effort: Pulmonary effort is normal. No accessory muscle usage.      Breath sounds: No stridor. Examination of the right-lower field reveals rales. Decreased breath sounds and rales present.   Lymphadenopathy:      Cervical: No cervical adenopathy.   Skin:     General: Skin is warm and dry.   Neurological:  General: No focal deficit present.      Mental Status: She is alert.      Comments: A&O x1 to self   Psychiatric:         Mood and Affect: Mood is anxious.         Workup Review:  Workup Review as  of Mar 25 2122   Others' Documentation   Sat Mar 26, 2019   1651 63 yo F who presents with cp, states it started, center of chest, pleuritic cp, lasts 30 secs, radiates to the middle of her back.  Sharp pain in left shoulder. Pt brought in by EMS, was noted to be hypotensive in the field.     [JO]      Workup Review User Index  [JO] Fredirick Maudlin, MD      Orders Placed This Encounter   Procedures   . X-Ray Chest Single View   . CT Head W/O Contrast   . CTA Chest   . CTA Abdomen And Pelvis   . CBC w/ Diff Lavender   . aPTT, Blood Blue   . Comprehensive Metabolic Panel Green   . CPK, Blood Green Plasma Separator Tube   . Magnesium, Blood Green Plasma Separator Tube   . Phosphorus, Blood Green Plasma Separator Tube   . Pro Bnp, Blood Green Plasma Separator Tube   . Troponin T Gen 5 w/Reflex to CK/CKMB Green Plasma Separator Tube   . Troponin T Gen 5 w/Reflex to CK/CKMB Green Plasma Separator Tube   . Troponin T Gen 5 w/Reflex to CK/CKMB Green Plasma Separator Tube   . Arterial Blood Gas Heparin Syringe   . Urinalysis with Culture Reflex, when indicated   . TSH, Blood - See Instructions   . Ammonia, Plasma - See Instructions   . Lipase, Blood Green Plasma Separator Tube   . Lithium - See Instructions   . Venous Blood Gas Heparin Syringe   . Troponin T Gen 5 w/Reflex to CK/CKMB Green Plasma Separator Tube   . Troponin T Gen 5 w/Reflex to CK/CKMB   . ECG 12 Lead   . ECG 12 Lead       Lab Results   Component Value Date    WBC 7.9 03/26/2019    RBC 4.45 03/26/2019    HGB 11.8 03/26/2019    HCT 36.2 03/26/2019    MCV 81.3 03/26/2019    MCHC 32.6 03/26/2019    RDW 16.8 (H) 03/26/2019    PLT 292 03/26/2019    MPV 8.9 (L) 03/26/2019     Lab Results   Component Value Date    NA 135 (L) 03/26/2019    K 4.6 03/26/2019    CL 104 03/26/2019    BICARB 23 03/26/2019    BUN 11 03/26/2019    CREAT 0.79 03/26/2019    GLU 80 03/26/2019    Primera 8.6 03/26/2019     Mg/Phos:  2.1/3.3 (07/04 1614)    Lab Results   Component Value Date      BNPP 163 03/26/2019     Lab Results   Component Value Date    AST 34 (H) 03/26/2019    ALT 18 03/26/2019    ALK 77 03/26/2019    TP 6.0 03/26/2019    ALB 3.7 03/26/2019    TBILI 0.20 03/26/2019    DBILI 0.1 03/24/2013     Lab Results   Component Value Date    PH 7.39 03/26/2019    PO2 34  03/26/2019    PCO2 48 03/26/2019     Lab Results   Component Value Date    TROPONIN <6 03/26/2019         EKG #1 - Sinus bradycardia, normal EKG  EKG #2 - Unchanged    CXR 03/26/19:  Single portable view of the chest. The cardiac silhouette is borderline in size. Query congestion the pulmonary vasculature. Minimal streaky left basal opacity may reflect atelectasis. Superimposed infection is to be excluded clinically. No worsened effusions or expanding pneumothorax. Degenerative change of the spine present.    CT: Stable    Impression & Initial ED Plan:  63 year old  female presents with intermittent chest pressure since yesterday concerning for unstable angina vs pulm exacerbation of COPD. Vitals reassuring. Labs reasurring for no cardiac cause.    -F/U CTA chest, abdomen, pelvis  -F/u Urine  -PO trial  Dispo to street    The rest of the ED course, results, and plan for the patient is in a separate continuation note. Please see that note for details.       I have discussed my evaluation and care plan for the patient with the attending physician Dr. Edythe Lynn.     Webb Silversmith, MD  Resident  03/26/19 2320       Fredirick Maudlin, MD  03/27/19 [phone]

## 2019-03-26 NOTE — ED Notes (Signed)
Endorsed care to primary RN

## 2019-03-26 NOTE — ED Notes (Signed)
Pt sent to CT

## 2019-03-26 NOTE — ED Notes (Signed)
Called [facility], they will come obtain ABG    Informed Dr Gwynneth Aliment that pt states she took 2 doses of 325 asa today, held dose here.

## 2019-03-26 NOTE — Discharge Instructions (Signed)
You were seen in the Emergency Department today for chest pain. We did a CT scan of your head and abdomen, and ECG, as well as blood work. The results were stable, unchanged from prior exams. To follow up with your additional results, please use MyChart.

## 2019-03-26 NOTE — ED EKG Interpretation (Signed)
ED EKG Interpretation    EKG: Normal Sinus Rhythm with Normal Axis and no ischemic changes.

## 2019-03-26 NOTE — ED Notes (Signed)
Triage report to primary RN Elmyra Ricks

## 2019-03-26 NOTE — ED Notes (Signed)
This RN covering Primary RN for lunch

## 2019-03-26 NOTE — ED Notes (Signed)
Pt ambulated to restroom with 1 person assist, pt normally uses a walker, pt refused and used RN as assist

## 2019-03-26 NOTE — ED Notes (Signed)
Provided pt with walker.

## 2019-03-26 NOTE — ED EKG Interpretation (Signed)
ED EKG Interpretation    EKG: Sinus Bradycardia with Normal Axis and new bradycardia compared to last ECG 04/14/18.

## 2019-03-27 ENCOUNTER — Telehealth (INDEPENDENT_AMBULATORY_CARE_PROVIDER_SITE_OTHER): Payer: Self-pay | Admitting: Family

## 2019-03-27 ENCOUNTER — Telehealth (INDEPENDENT_AMBULATORY_CARE_PROVIDER_SITE_OTHER): Payer: Self-pay

## 2019-03-27 LAB — ECG 12-LEAD
ATRIAL RATE: 57 {beats}/min
ATRIAL RATE: 54 {beats}/min
P AXIS: 62 degrees
P AXIS: 59 degrees
PR INTERVAL: 178 ms
PR INTERVAL: 180 ms
QRS INTERVAL/DURATION: 102 ms
QRS INTERVAL/DURATION: 100 ms
QT: 446 ms
QT: 450 ms
QTC INTERVAL: 434 ms
QTC INTERVAL: 426 ms
R AXIS: -12 degrees
R AXIS: -12 degrees
T AXIS: 23 degrees
T AXIS: 33 degrees
VENTRICULAR RATE: 57 {beats}/min
VENTRICULAR RATE: 54 {beats}/min

## 2019-03-27 LAB — COVID-19 CORONAVIRUS DETECTION ASSAY AT ~~LOC~~ LAB: COVID-19 Coronavirus Result: NOT DETECTED

## 2019-03-27 NOTE — ED Notes (Signed)
Patient is medically cleared for discharge and is agreeable to plan. Follow up with primary care and return to ED for worsening symptoms. Follow ED provider instructions. All questions answered. Patient signed paperwork. IVs removed. No Rx. [facility]. Pt ambulated with steady gait with walker. Food provided.

## 2019-03-27 NOTE — Telephone Encounter (Signed)
Lab Results   Component Value Date    CO19 Not Detected 03/26/2019    CO19 Not Detected 01/18/2019     Patient notified test for COVID-19 are NEGATIVE:    Patient instructions given below:  Because this is a newer test the CDC and [place] still recommend that you leave home only after these three things have happened:    1. You have had no fever for at least 72 hours (that is three full days of no fever without the use medicine that reduces fevers)  2. Other symptoms have improved (for example, when your cough or shortness of breath have improved)  3. At least 10 days have passed since your symptoms first appeared    We advise you to remain in home isolation until all CDC guidelines are met.  Please implement strict social distancing, and only leave home to seek/attend medical care.    Contact our dedicated nurse line [phone], if you are noting worsening respiratory symptoms and need advice. This line is open 8 am -5 pm 7 days a week.  If sudden and severe worsening in symptoms please call 911.      Thank you,  Jonetta Speak, NP  Signature Derived From Controlled Access Password, March 27, 2019, 2:52 PM

## 2019-03-27 NOTE — Telephone Encounter (Signed)
Coronavirus COVID-19 Detection Assay In Process    Ed d/c

## 2019-03-28 NOTE — Telephone Encounter (Signed)
Pt advised. better

## 2019-03-28 NOTE — Telephone Encounter (Signed)
lmtcb

## 2019-05-03 ENCOUNTER — Ambulatory Visit (INDEPENDENT_AMBULATORY_CARE_PROVIDER_SITE_OTHER): Admitting: Physician Assistant

## 2019-05-03 VITALS — BP 107/69 | HR 72 | Temp 98.7°F | Resp 14

## 2019-05-03 MED ORDER — LYRICA 200 MG OR CAPS
200.0000 mg | ORAL_CAPSULE | Freq: Two times a day (BID) | ORAL | 1 refills | Status: DC
Start: 2019-05-03 — End: 2019-08-16

## 2019-05-03 MED ORDER — METHOCARBAMOL 500 MG OR TABS
500.0000 mg | ORAL_TABLET | Freq: Three times a day (TID) | ORAL | 2 refills | Status: AC
Start: 2019-05-03 — End: 2019-06-02

## 2019-05-03 MED ORDER — AMOXICILLIN-POT CLAVULANATE 875-125 MG OR TABS
1.0000 | ORAL_TABLET | Freq: Two times a day (BID) | ORAL | 0 refills | Status: AC
Start: 2019-05-03 — End: 2019-05-10

## 2019-05-03 NOTE — Progress Notes (Signed)
Subjective:  Alexandria Proctor is a 63 year old female who is here for Refill Request    Alexandria Proctor presents for refill on robaxin and lyrica.  She uses medication for chronic pain / fibromyalgia  She reports pain of her gums over the past 3 days, localized, associated swelling of the lymph nodes on the right side. She has been applying warm compress / ice     Patient ROS:  General negative for fever  HEENT negative for headache  Cardiac negative for chest pain  Respiratory negative for Shortness of breath   GI negative for abdominal pain   Skin Negative for rash       Current Outpatient Medications   Medication Sig Dispense Refill    amoxicillin-clavulanate (AUGMENTIN) 875-125 MG tablet Take 1 tablet by mouth 2 times daily for 7 days. 14 tablet 0    atorvastatin (LIPITOR) 40 MG tablet Take 1 tablet (40 mg) by mouth every evening. 14 tablet 1    cloNIDine (CATAPRES) 0.2 MG/24HR patch       LYRICA 200 MG capsule Take 1 capsule (200 mg) by mouth 2 times daily. 180 capsule 1    methocarbamol (ROBAXIN) 500 MG tablet Take 1 tablet (500 mg) by mouth 3 times daily. 90 tablet 2     No current facility-administered medications for this visit.      Allergies   Allergen Reactions    Compazine Anaphylaxis     Per patient tolerates promethazine/Phenergan    Gabapentin Hives    Haldol [Haloperidol] Swelling    Ondansetron Swelling    Reglan [Metoclopramide] Anaphylaxis    Ultram [Tramadol] Swelling    Zofran [Ondansetron] Anaphylaxis    Toradol Other     "my throat closes up and tongue swells up"       Reviewed pts pertinent information related to social history, past medical history, past surgical and family history.    Objective:  Vital signs: BP 107/69   Pulse 72   Temp 98.7 F (37.1 C)   Resp 14   SpO2 99%     Physical Exam  HENT:      Head: Normocephalic and atraumatic.      Mouth/Throat:      Comments: Poor dentition, right sided mandibular lymph nodes swollen   Eyes:      Conjunctiva/sclera: Conjunctivae  normal.   Cardiovascular:      Rate and Rhythm: Normal rate.   Pulmonary:      Effort: Pulmonary effort is normal. No respiratory distress.   Neurological:      General: No focal deficit present.      Mental Status: She is alert.   Psychiatric:         Mood and Affect: Mood normal.         Assessment / Plan:   Alexandria Proctor was seen today for Refill Request      Diagnosed with:    ICD-10-CM ICD-9-CM    1. Dental infection   -start augmentin twice daily for 1 week, stay hydrated, limit sugar intake, follow up with dentist for new, worsening or persistent symptoms  K04.7 522.4 amoxicillin-clavulanate (AUGMENTIN) 875-125 MG tablet   2. Fibromyalgia   -continue with medical management  M79.7 729.1 LYRICA 200 MG capsule      methocarbamol (ROBAXIN) 500 MG tablet     No orders of the defined types were placed in this encounter.

## 2019-06-20 ENCOUNTER — Emergency Department (HOSPITAL_BASED_OUTPATIENT_CLINIC_OR_DEPARTMENT_OTHER): Payer: Medicare Other

## 2019-06-20 ENCOUNTER — Emergency Department
Admission: EM | Admit: 2019-06-20 | Discharge: 2019-06-20 | Disposition: A | Discharge status: Home / Self Care | Payer: Medicare Other | Attending: Emergency Medicine | Admitting: Emergency Medicine

## 2019-06-20 ENCOUNTER — Emergency Department (EMERGENCY_DEPARTMENT_HOSPITAL): Payer: Medicare Other

## 2019-06-20 DIAGNOSIS — Z9884 Bariatric surgery status: Secondary | ICD-10-CM

## 2019-06-20 DIAGNOSIS — Z8679 Personal history of other diseases of the circulatory system: Secondary | ICD-10-CM | POA: Insufficient documentation

## 2019-06-20 DIAGNOSIS — Z9049 Acquired absence of other specified parts of digestive tract: Secondary | ICD-10-CM | POA: Insufficient documentation

## 2019-06-20 DIAGNOSIS — Z8249 Family history of ischemic heart disease and other diseases of the circulatory system: Secondary | ICD-10-CM | POA: Insufficient documentation

## 2019-06-20 DIAGNOSIS — Z9889 Other specified postprocedural states: Secondary | ICD-10-CM | POA: Insufficient documentation

## 2019-06-20 DIAGNOSIS — K838 Other specified diseases of biliary tract: Secondary | ICD-10-CM

## 2019-06-20 DIAGNOSIS — R109 Unspecified abdominal pain: Secondary | ICD-10-CM

## 2019-06-20 DIAGNOSIS — I10 Essential (primary) hypertension: Secondary | ICD-10-CM | POA: Insufficient documentation

## 2019-06-20 DIAGNOSIS — Z79899 Other long term (current) drug therapy: Secondary | ICD-10-CM | POA: Insufficient documentation

## 2019-06-20 DIAGNOSIS — R197 Diarrhea, unspecified: Secondary | ICD-10-CM | POA: Insufficient documentation

## 2019-06-20 DIAGNOSIS — E119 Type 2 diabetes mellitus without complications: Secondary | ICD-10-CM | POA: Insufficient documentation

## 2019-06-20 DIAGNOSIS — Z90710 Acquired absence of both cervix and uterus: Secondary | ICD-10-CM | POA: Insufficient documentation

## 2019-06-20 DIAGNOSIS — Z885 Allergy status to narcotic agent status: Secondary | ICD-10-CM | POA: Insufficient documentation

## 2019-06-20 DIAGNOSIS — F17210 Nicotine dependence, cigarettes, uncomplicated: Secondary | ICD-10-CM | POA: Insufficient documentation

## 2019-06-20 DIAGNOSIS — Z8719 Personal history of other diseases of the digestive system: Secondary | ICD-10-CM | POA: Insufficient documentation

## 2019-06-20 DIAGNOSIS — R160 Hepatomegaly, not elsewhere classified: Secondary | ICD-10-CM

## 2019-06-20 DIAGNOSIS — E785 Hyperlipidemia, unspecified: Secondary | ICD-10-CM | POA: Insufficient documentation

## 2019-06-20 DIAGNOSIS — J449 Chronic obstructive pulmonary disease, unspecified: Secondary | ICD-10-CM | POA: Insufficient documentation

## 2019-06-20 DIAGNOSIS — Z888 Allergy status to other drugs, medicaments and biological substances status: Secondary | ICD-10-CM | POA: Insufficient documentation

## 2019-06-20 DIAGNOSIS — R110 Nausea: Secondary | ICD-10-CM | POA: Insufficient documentation

## 2019-06-20 DIAGNOSIS — Z6836 Body mass index (BMI) 36.0-36.9, adult: Secondary | ICD-10-CM | POA: Insufficient documentation

## 2019-06-20 DIAGNOSIS — R1033 Periumbilical pain: Secondary | ICD-10-CM | POA: Insufficient documentation

## 2019-06-20 DIAGNOSIS — E669 Obesity, unspecified: Secondary | ICD-10-CM | POA: Insufficient documentation

## 2019-06-20 LAB — UA, CHEM ONLY POCT
Bilirubin: NEGATIVE
Blood: NEGATIVE
Glucose: NEGATIVE
Ketones: NEGATIVE
Nitrite: NEGATIVE
Protein: NEGATIVE
Specific Gravity: 1.015 (ref 1.002–1.030)
Urobilinogen: 0.2 (ref 0.2–1)
pH: 7 (ref 5.0–8.0)

## 2019-06-20 LAB — COMPREHENSIVE METABOLIC PANEL, BLOOD
ALT (SGPT): 17 U/L (ref 0–33)
AST (SGOT): 17 U/L (ref 0–32)
Albumin: 4.1 g/dL (ref 3.5–5.2)
Alkaline Phos: 90 U/L (ref 35–140)
Anion Gap: 10 mmol/L (ref 7–15)
BUN: 13 mg/dL (ref 8–23)
Bicarbonate: 28 mmol/L (ref 22–29)
Bilirubin, Tot: 0.2 mg/dL (ref <1.2)
Calcium: 9.9 mg/dL (ref 8.5–10.6)
Chloride: 105 mmol/L (ref 98–107)
Creatinine: 1.01 mg/dL — ABNORMAL HIGH (ref 0.51–0.95)
GFR: >60 mL/min
Glucose: 92 mg/dL (ref 70–99)
Potassium: 3.9 mmol/L (ref 3.5–5.1)
Sodium: 143 mmol/L (ref 136–145)
Total Protein: 7 g/dL (ref 6.0–8.0)

## 2019-06-20 LAB — CBC WITH DIFF, BLOOD
ANC-Automated: 5 10*3/uL (ref 1.6–7.0)
Abs Basophils: 0 10*3/uL
Abs Eosinophils: 0.2 10*3/uL (ref 0.0–0.5)
Abs Lymphs: 1.1 10*3/uL (ref 0.8–3.1)
Abs Monos: 0.6 10*3/uL (ref 0.2–0.8)
Basophils: 0 %
Eosinophils: 3 %
Hct: 39.4 % (ref 34.0–45.0)
Hgb: 12.9 gm/dL (ref 11.2–15.7)
Lymphocytes: 16 %
MCH: 26.7 pg (ref 26.0–32.0)
MCHC: 32.7 g/dL (ref 32.0–36.0)
MCV: 81.4 um3 (ref 79.0–95.0)
MPV: 8.9 fL — ABNORMAL LOW (ref 9.4–12.4)
Monocytes: 9 %
Plt Count: 330 10*3/uL (ref 140–370)
RBC: 4.84 10*6/uL (ref 3.90–5.20)
RDW: 16.5 % — ABNORMAL HIGH (ref 12.0–14.0)
Segs: 72 %
WBC: 6.9 10*3/uL (ref 4.0–10.0)

## 2019-06-20 LAB — LACTATE, BLOOD: Lactate: 1 mmol/L (ref 0.5–2.0)

## 2019-06-20 LAB — URINALYSIS WITH CULTURE REFLEX, WHEN INDICATED
Bilirubin: NEGATIVE
Blood: NEGATIVE
Glucose: NEGATIVE
Ketones: NEGATIVE
Leuk Esterase: 75 Leu/uL — AB
Nitrite: NEGATIVE
Protein: NEGATIVE
Specific Gravity: 1.018 (ref 1.002–1.030)
Urobilinogen: NEGATIVE
pH: 7 (ref 5.0–8.0)

## 2019-06-20 LAB — LIPASE, BLOOD: Lipase: 25 U/L (ref 13–60)

## 2019-06-20 MED ORDER — MORPHINE SULFATE 4 MG/ML IJ SOLN
4.0000 mg | Freq: Once | INTRAMUSCULAR | Status: AC
Start: 2019-06-20 — End: 2019-06-20
  Administered 2019-06-20: 11:00:00 4 mg via INTRAVENOUS
  Filled 2019-06-20: qty 1

## 2019-06-20 MED ORDER — MORPHINE SULFATE 4 MG/ML IJ SOLN
4.0000 mg | Freq: Once | INTRAMUSCULAR | Status: AC
Start: 2019-06-20 — End: 2019-06-20
  Administered 2019-06-20: 4 mg via INTRAVENOUS
  Filled 2019-06-20: qty 1

## 2019-06-20 MED ORDER — IOHEXOL 350 MG/ML IV SOLN
100.0000 mL | Freq: Once | INTRAVENOUS | Status: AC
Start: 2019-06-20 — End: 2019-06-20
  Administered 2019-06-20: 12:00:00 100 mL via INTRAVENOUS
  Filled 2019-06-20: qty 100

## 2019-06-20 NOTE — ED Provider Notes (Signed)
Emergency Department Note  [facility] electronic medical record reviewed for pertinent medical history.     Patient: Alexandria Proctor, MRN [id number], DOB 1955-10-18  The Date of Service for the Emergency Room encounter is 06/20/2019  3:50 AM     Nursing Triage Note :   Chief Complaint   Patient presents with   . Abdominal Pain     Patient BIBM with c/o abdominal pain x5 days.  En route VSS and no interventions provided.  Hx of gastric bypass 2009.         HPI :   Alexandria Proctor is a 63 year old female with PMHx as below notable for AAA, gastric bypass c/b bowel obstruction in past, hysterectomy, and cholecystectomy who presents with abdominal pain.    Patient states that she has had chronic abdominal pain for awhile, however, became bothersome and progressive 4-5 days ago. Pt states pain is sharp, squeezing and located around umbilicus. Pain is constant and radiates to her R lateral side. Pain worsened with food and laying flat on her back. Patient is passing gas and last BM was this AM. BM was watery diarrhea, non bloody; has been having loose stools for the last few days. Endorsing nausea but denies vomiting. Last ate crackers earlier today but stopped d/t pain and nausea. Denies fevers, chills, night sweats, recent travel, sick contacts. States that her pain is not relieved by APAP, lyrica, or benadryl. Denies syncopal episodes. Confirms hx of gastric bypass c/b bowel obstruction (did not need surgery), hysterectomy, and cholecystectomy, AAA.     Patient denies headache, changes in vision, neck pain, shortness of breath, chest pain, cough, diaphoresis, muscle aches.      Past Medical History :   Past Medical History:   Diagnosis Date   . Amphetamine abuse (CMS-HCC)    . Aortic aneurysm (CMS-HCC)    . Bipolar 1 disorder (CMS-HCC)    . Bipolar affective (CMS-HCC)    . COPD (chronic obstructive pulmonary disease) (CMS-HCC)    . Diabetes mellitus (CMS-HCC)    . Fracture, intertrochanteric, left femur (CMS-HCC)    . Gastric  bypass status for obesity    . H/O: hysterectomy    . HLD (hyperlipidemia)    . HTN (hypertension)    . Manic depression (CMS-HCC)    . Obesity    . Opioid abuse (CMS-HCC)    . Polyarthropathy or polyarthritis of multiple sites    . Schizophrenia (CMS-HCC)        Past Surgical history :   Past Surgical History:   Procedure Laterality Date   . GASTRIC BYPASS     . GASTRIC BYPASS     . hx gastric bypass     . s/p IMN left IT fracture 07/20/16     . STOMACH SURGERY     . Ventral Hernia repair     Cholecystectomy   Hysterectomy    Family History:   Family History   Problem Relation Name Age of Onset   . Schizophrenia Mother     . Hypertension Mother     Denies bleeding disorders     Social History:  Social History     Tobacco Use   . Smoking status: Current Every Day Smoker     Packs/day: 1.00     Types: Cigarettes   . Smokeless tobacco: Never Used   Substance Use Topics   . Alcohol use: Yes     Alcohol/week: 0.0 standard drinks   . Drug use: Yes  Types: Methamphetamines, Marijuana     Alcohol: Denies  Tobacco: 9 cigarettes/day, smoked for 51 years  Illicit drugs: Denies    Medications:   Prior to Admission Medications   Prescriptions Last Dose Informant Patient Reported? Taking?   LYRICA 200 MG capsule   No No   Sig: Take 1 capsule (200 mg) by mouth 2 times daily.   atorvastatin (LIPITOR) 40 MG tablet   No No   Sig: Take 1 tablet (40 mg) by mouth every evening.   cloNIDine (CATAPRES) 0.2 MG/24HR patch   Yes No      Facility-Administered Medications: None       Allergies: Compazine, Gabapentin, Haldol [haloperidol], Ondansetron, Reglan [metoclopramide], Ultram [tramadol], Zofran [ondansetron], and Toradol    Review of Systems:  Complete review of 12 systems reviewed and negative unless otherwise noted in the HPI or above. This was done per my custom and practice for systems appropriate to the chief complaint in an emergency department setting and varies depending on the quality of history that the patient is able to  provide. History reviewed today as available from records and EPIC chart.      Physical Exam:   06/20/19  0150   BP: 145/87   Pulse: 67   Resp: 18   Temp: 97.1 F (36.2 C)   SpO2: 98%       Constitutional: Patient appears in mild to moderate discomfort, pleasant and cooperative.  HENT:   Head: Normocephalic and atraumatic.   Mouth/Throat: Missing teeth. Oropharynx is clear and moist  Eyes: EOMI. Pupils are equal, round, and reactive to light.   Neck: Normal range of motion. Neck supple.   Cardiovascular: Normal rate, regular rhythm and normal heart sounds.   Pulmonary/Chest: Effort normal and normal breath sounds. No stridor. No respiratory distress.  Abdominal: Bowel sounds present. Various well-healed surgical scars, most notable midline scar. No distention, obese. Guarding with palpation throughout abdomen. Pain with palpation and rebound tenderness in periumbilical region, most notably in RLQ.   Musculoskeletal: Normal range of motion. No edema, tenderness or deformity. BLE 2+ equal pedal pulses.  Neurological: Alert and oriented to person, place, and time.   Skin: Skin is warm and dry.       ED Course & Clinical Decision Making:  64 year old  female with PMHx as listed in HPI presents with abdominal pain.    Vitals: wnl/ stable/ afebrile  Pertinent Exam Findings:  Abdominal: Bowel sounds present. Various well-healed surgical scars, most notable midline scar. No distention, obese. Guarding with palpation throughout abdomen. Pain with palpation and rebound tenderness in periumbilical region, most notably in RLQ.     Given her history of gastric bypass and other abdominal surgeries, SBO on diff dx. She also has hx of AAA but vitals are stable thus lower c/f AAA rupture. Viral gastroenteritis is also possible given 4-5 days of loose bowels. Given abdominal pain in periumbilical area, cannot r/o early presentation of appendicitis. Patient has history of gastric bypass, will also consider gastric leak.  We consulted  general surgery who recommended ordering a CT abdomen and pelvis with and without oral and IV contrast.    Plan:   EKG  Basic labs  U/A  CXR  CT A/P  Lipase  Surgery Consult    Orders Placed This Encounter   Procedures   . Comprehensive Metabolic Panel Green   . CBC w/ Diff Lavender   . Lipase, Blood Green Plasma Separator Tube   . Urinalysis with Culture  Reflex, when indicated   . ECG 12 Lead     Medications - No data to display    Diagnostic Imaging Studies  No results found.    Workup Review as of Jun 19 706   Lovey NewcomerMcIntyre, Lailee Hoelzel [facility] Center[facility]'s Documentation   Mon Jun 20, 2019   0543 Lipase: 25   0519 Plt Count: 330   0519 Hgb: 12.9   0519 WBC: 6.9      Others' Documentation   Mon Jun 20, 2019   [id number] XOVER.  Hx of abd surgerie (AAA, chole, gastric bypass) here w/ 5 days abd pain n/v.  No fever. Tender PU area.  Labs ok.  Surg consulted.  CTAP pending.      [RC]   [phone] ED Handoff Note  Receiving signout from Dr. Dimas AguasMcIntyre  Stable  Synopsis: abd pain, hx of multiple gastric surgeries, vitals good, tender around central scar   To do/pending items:  F/u CT PO and IV contrast  F/u surg  Dispo plan:   tbd        [BS]      Workup Review User Index  [BS] Supat, Larena SoxBenjamin Robin, MD  [RC] Alvan Damehilders, Richard C, MD     ED Course:  - CMP unremarkable  - CBC unremarkable, thus less likely infectious etiology.  - Lipase normal thus unlikely pancreatitis.    Patient's EKG shows no signs of ischemia.  Labs unremarkable.  She is still awaiting CT scan.  Patient signed out to oncoming team.  Dispo pending CT and surgery final recommendations.    The rest of the ED course, results, plan and disposition for the patient is in a separate ED MD Progress note. Please see that note for details.     I have discussed my thought process and plan for the patient with the attending physician, Blima SingerBrice, Jessica Alexandra*.       Viviana SimplerMcIntyre, Kieren Adkison Eileen, MD  Resident  06/20/19 [phone]       Blima SingerBrice, Jessica Alexandra, MD  06/20/19 [phone]

## 2019-06-20 NOTE — H&P (Deleted)
Surgery History & Physical Note    Patient Name: Alexandria Proctor MRN: [id number] Room#: T5/T05     Date: 06/20/19  Hospital Day:   0 days - Admitted on: 06/20/2019    HPI:   78M with a history of open gastric bypass (BMI 41), hysterectomy, and laparoscopic cholecystectomy presents to ED with abdominal pain.  The peri-umbilical crampy abdominal pain started 5 days ago and is associated with diarrhea and nausea.  She denies fever, rigors, diaphoresis, hematemesis, melena, or hematochezia. Passing flatus today. Last passed watery bowel movement yesterday.    In the ED, patient was found to be afebrile and without tachycardia.  Labs demonstrate no leukocytosis.    Vitals signs:  Temperature:  [97.1 F (36.2 C)] 97.1 F (36.2 C) (09/28 0150)  Blood pressure (BP): (145)/(87) 145/87 (09/28 0150)  Heart Rate:  [67] 67 (09/28 0150)  Respirations:  [18] 18 (09/28 0150)  Pain Score: 8 (09/28 0515)  O2 Device: None (Room air) (09/28 0150)  SpO2:  [98 %] 98 % (09/28 0150)    No data recorded     Review of Systems:  Constitutional: Negative for fever, fatigue, weight loss, malaise, anorexia.  Eyes: Negative for scleral icterus  Ears, Nose, Mouth, Throat: Negative for sore throat, persistent sore throat, nasal congestion, rhinorrhea.  CV: Negative for chest pain.  Resp: Negative for cough, shortness of breath.  GI: Positive for abdominal pain. Negative for vomitin, hematemesis, distention, melena.  GU: Negative for dysuria.  Musculoskeletal: Negative for joint swelling, joint pain, joint redness.  Integumentary: Negative for rash.  Neuro: Negative for confusion, seizures, paralysis/weakness, numbness or tingling.  Psych: Negative for suicidal ideation.  Endo: Negative for cold intolerance, heat intolerance, polyphagia, polydipsia, polyuria.  Heme/Lymphatic: Negative for anemia, heat/cold intolerance, polydypsia, polyphagia, polyuria.  Allergy/Immun: Negative for itchy eyes, itchy nose, rash.    Physical Exam:  General Appearance:  No acute distress. Alert and oriented x4.  Heart:  Regular rate.  Lungs: Nonlabored on room air  Abdomen: Soft. Obese. Mildly distended. Periumbilical pain without rebound.  Extremities:  No pitting edema.   Vascular: Palpable DP pulses    Labs:  Recent Labs     06/20/19  0415   WBC 6.9   HGB 12.9   HCT 39.4   PLT 330   SEG 72   LYMPHS 16   MONOS 9       Recent Labs     06/20/19  0415   NA 143   K 3.9   CL 105   BICARB 28   BUN 13   CREAT 1.01*   GLU 92   [place] 9.9       Recent Labs     06/20/19  0415   ALK 90   AST 17   ALT 17   TBILI 0.20   ALB 4.1       No results for input(s): PT, PTT, INR in the last 72 hours.      Radiology:   None    Microbiology:  None    Medications:  No current facility-administered medications for this encounter.      Current Outpatient Medications   Medication Sig   . atorvastatin (LIPITOR) 40 MG tablet Take 1 tablet (40 mg) by mouth every evening.   . cloNIDine (CATAPRES) 0.2 MG/24HR patch    . LYRICA 200 MG capsule Take 1 capsule (200 mg) by mouth 2 times daily.         ASSESSMENT / PLAN:  The patient is complaining of superficial abdominal pain pain to the left of her umbilicus. She has no rebound tenderness or other peritoneal signs. Bedside ultrasound showed no evidence of hernia, which was confirmed with CT scan. She has no bowel containing abdominal wall hernia, no evidence of internal hernia/volvulous, and no evidence of bowel obstruction on imaging and on questioning. The bowel appears well perfused on CTA. Labs/Vitals are WNL. Etiology of her superficial abdominal pain is unclear, but no surgical intervention is warranted at this time.     Ethylene Reznick DO

## 2019-06-20 NOTE — ED Notes (Signed)
Pt discharged with instructions to f/u with PMD or return to ED for new/concerning sxs. Pt v/u, denied questions/concerns & ambulated out of ED with steady gait using walker.

## 2019-06-20 NOTE — ED Notes (Signed)
Pt returned to sleeping once back in room. RR even/unlabored

## 2019-06-20 NOTE — ED Notes (Signed)
Assumed care of pt in ED with abd pain. Per report pt has had multiple IV attempts that have infiltrated. Pt agitated. However, need access for CT & tx. Surgery team at bedside. Will attempt IV access again.

## 2019-06-20 NOTE — ED Notes (Signed)
Pt PIV infiltrated, will attempt to have another [place] IV placed

## 2019-06-20 NOTE — ED Notes (Signed)
Surgery MD at bedside.

## 2019-06-20 NOTE — Consults (Signed)
Surgery History & Physical Note    Patient Name: Alexandria Proctor MRN: [id number] Room#: T5/T05     Date: 06/20/19  Hospital Day:   0 days - Admitted on: 06/20/2019    HPI:   22M with a history of open gastric bypass (BMI 2), hysterectomy, and laparoscopic cholecystectomy presents to ED with abdominal pain.  The peri-umbilical crampy abdominal pain started 5 days ago and is associated with diarrhea and nausea.  She denies fever, rigors, diaphoresis, hematemesis, melena, or hematochezia. Passing flatus today. Last passed watery bowel movement yesterday.    In the ED, patient was found to be afebrile and without tachycardia.  Labs demonstrate no leukocytosis.    Vitals signs:  Temperature:  [97.1 F (36.2 C)] 97.1 F (36.2 C) (09/28 0150)  Blood pressure (BP): (145)/(87) 145/87 (09/28 0150)  Heart Rate:  [67] 67 (09/28 0150)  Respirations:  [18] 18 (09/28 0150)  Pain Score: 8 (09/28 0515)  O2 Device: None (Room air) (09/28 0150)  SpO2:  [98 %] 98 % (09/28 0150)    No data recorded     Review of Systems:  Constitutional: Negative for fever, fatigue, weight loss, malaise, anorexia.  Eyes: Negative for scleral icterus  Ears, Nose, Mouth, Throat: Negative for sore throat, persistent sore throat, nasal congestion, rhinorrhea.  CV: Negative for chest pain.  Resp: Negative for cough, shortness of breath.  GI: Positive for abdominal pain. Negative for vomitin, hematemesis, distention, melena.  GU: Negative for dysuria.  Musculoskeletal: Negative for joint swelling, joint pain, joint redness.  Integumentary: Negative for rash.  Neuro: Negative for confusion, seizures, paralysis/weakness, numbness or tingling.  Psych: Negative for suicidal ideation.  Endo: Negative for cold intolerance, heat intolerance, polyphagia, polydipsia, polyuria.  Heme/Lymphatic: Negative for anemia, heat/cold intolerance, polydypsia, polyphagia, polyuria.  Allergy/Immun: Negative for itchy eyes, itchy nose, rash.    Physical Exam:  General Appearance:  No acute distress. Alert and oriented x4.  Heart:  Regular rate.  Lungs: Nonlabored on room air  Abdomen: Soft. Obese. Mildly distended. Periumbilical pain without rebound.  Extremities:  No pitting edema.   Vascular: Palpable DP pulses    Labs:  Recent Labs     06/20/19  0415   WBC 6.9   HGB 12.9   HCT 39.4   PLT 330   SEG 72   LYMPHS 16   MONOS 9       Recent Labs     06/20/19  0415   NA 143   K 3.9   CL 105   BICARB 28   BUN 13   CREAT 1.01*   GLU 92   [place] 9.9       Recent Labs     06/20/19  0415   ALK 90   AST 17   ALT 17   TBILI 0.20   ALB 4.1       No results for input(s): PT, PTT, INR in the last 72 hours.      Radiology:   None    Microbiology:  None    Medications:  No current facility-administered medications for this encounter.      Current Outpatient Medications   Medication Sig   . atorvastatin (LIPITOR) 40 MG tablet Take 1 tablet (40 mg) by mouth every evening.   . cloNIDine (CATAPRES) 0.2 MG/24HR patch    . LYRICA 200 MG capsule Take 1 capsule (200 mg) by mouth 2 times daily.         ASSESSMENT / PLAN:  The patient is complaining of superficial abdominal pain pain to the left of her umbilicus. She has no rebound tenderness or other peritoneal signs. Bedside ultrasound showed no evidence of hernia, which was confirmed with CT scan. She has no bowel containing abdominal wall hernia, no evidence of internal hernia/volvulous, and no evidence of bowel obstruction on imaging and on questioning. The bowel appears well perfused on CTA. Labs/Vitals are WNL. Etiology of her superficial abdominal pain is unclear, but no surgical intervention is warranted at this time.     Eric Raschke DO

## 2019-06-20 NOTE — ED MD Progress Note (Signed)
Workup Review as of Jun 19 1209   Vassie Loll Robin's Documentation   Mon Jun 20, 2019    CT negative for acute findings. Per surgery, no concern for emergent intra-abdominal pathology. Pt resting comfortably on reassessment. Discussed findings of workup thus far. Plan of discharge with return precautions.    1478 ED Handoff Note  Receiving signout from Dr. Dimas Aguas  Synopsis: abd pain, hx of multiple gastric surgeries, vitals good, tender around central scar   To do/pending items:  F/u CT PO and IV contrast  F/u surg  Dispo plan:   tbd             Others' Documentation   Mon Jun 20, 2019   0647 XOVER.  Hx of abd surgerie (AAA, chole, gastric bypass) here w/ 5 days abd pain n/v.  No fever. Tender PU area.  Labs ok.  Surg consulted.  CTAP pending.      [RC]   0543 Lipase: 25 [KM]   0519 Plt Count: 330 [KM]   0519 Hgb: 12.9 [KM]   0519 WBC: 6.9 [KM]      Workup Review User Index  [KM] Viviana Simpler, MD  [RC] Alvan Dame, MD       Results for orders placed or performed during the hospital encounter of 06/20/19   Comprehensive Metabolic Panel Green   Result Value Ref Range    Glucose 92 70 - 99 mg/dL    BUN 13 8 - 23 mg/dL    Creatinine 2.95 (H) 0.51 - 0.95 mg/dL    GFR >62 mL/min    Sodium 143 136 - 145 mmol/L    Potassium 3.9 3.5 - 5.1 mmol/L    Chloride 105 98 - 107 mmol/L    Bicarbonate 28 22 - 29 mmol/L    Anion Gap 10 7 - 15 mmol/L    Calcium 9.9 8.5 - 10.6 mg/dL    Total Protein 7.0 6.0 - 8.0 g/dL    Albumin 4.1 3.5 - 5.2 g/dL    Bilirubin, Tot 1.30 <1.2 mg/dL    AST (SGOT) 17 0 - 32 U/L    ALT (SGPT) 17 0 - 33 U/L    Alkaline Phos 90 35 - 140 U/L   CBC w/ Diff [place]   Result Value Ref Range    WBC 6.9 4.0 - 10.0 1000/mm3    RBC 4.84 3.90 - 5.20 mill/mm3    Hgb 12.9 11.2 - 15.7 gm/dL    Hct 86.5 78.4 - 69.6 %    MCV 81.4 79.0 - 95.0 um3    MCH 26.7 26.0 - 32.0 pgm    MCHC 32.7 32.0 - 36.0 g/dL    RDW 29.5 (H) 28.4 - 14.0 %    MPV 8.9 (L) 9.4 - 12.4 fL    Plt Count 330 140 - 370  1000/mm3    Segs 72 %    Lymphocytes 16 %    Monocytes 9 %    Eosinophils 3 %    Basophils 0 %    ANC-Automated 5.0 1.6 - 7.0 1000/mm3    Abs Lymphs 1.1 0.8 - 3.1 1000/mm3    Abs Monos 0.6 0.2 - 0.8 1000/mm3    Abs Eosinophils 0.2 0.0 - 0.5 1000/mm3    Abs Basophils 0.0 0.0 1000/mm3    Diff Type Automated    Lipase, Blood Green Plasma Separator Tube   Result Value Ref Range    Lipase 25 13 - 60 U/L  Urinalysis with Culture Reflex, when indicated    Specimen: Clean Catch; Urine   Result Value Ref Range    Type Clean catch     Color Light-Yellow Yellow    Appearance Clear Clear    Specific Gravity 1.018 1.002 - 1.030    pH 7.0 5.0 - 8.0    Protein Negative Negative    Glucose Negative Negative    Ketones Negative Negative    Bilirubin Negative Negative    Blood Negative Negative    Urobilinogen Negative Negative    Nitrite Negative Negative    Leuk Esterase 75 (A) Negative Leu/uL    WBC 6-10 (A) 0-2/HPF    RBC 0-2 0-2/HPF    Bacteria Rare None-Rare/HPF    Squam. Epithelial Cell 0-5(RARE) <6-10(FEW)   Lactate, Blood - See Instructions   Result Value Ref Range    Lactate 1.0 0.5 - 2.0 mmol/L   UA, Chem Only POCT   Result Value Ref Range    Color Yellow Yellow    Appearance Clear Clear    Specific Gravity 1.015 1.002 - 1.030    pH 7.0 5.0 - 8.0    Protein Negative Negative    Glucose Negative Negative    Ketones Negative Negative    Bilirubin Negative Negative    Blood Negative Negative    Urobilinogen 0.2 0.2-1 EU/dL    Nitrite Negative Negative    Leuk Esterase Trace (A) Negative       CT Abdomen And Pelvis With Contrast   Final Result   IMPRESSION:   CT scan of the abdomen and pelvis with IV contrast.   1. No acute findings of the abdomen and pelvis. No obstruction.   2. Additional stable findings as described above.         X-Ray Chest Single View   Final Result   IMPRESSION:   No evidence of free air.

## 2019-06-20 NOTE — ED Notes (Signed)
Pt to CT

## 2019-06-20 NOTE — ED Notes (Signed)
Unable to obtain PIV, [place] IV RN to attempt

## 2019-06-20 NOTE — ED Notes (Signed)
Pt refused ekg

## 2019-06-20 NOTE — ED Notes (Signed)
Pt sleeping. 

## 2019-06-20 NOTE — Discharge Instructions (Addendum)
Please follow up with your primary care doctor as needed.

## 2019-06-20 NOTE — ED Notes (Signed)
Pt given PO contrast, pt educated about importance of finishing contrast in timely matter so that diagnostic CT can be done. Pt asked to not go back to sleep until contrast is finished.

## 2019-06-20 NOTE — EMS Narrative (Addendum)
Pt Age: 63 Years; Gender: Female; Is there any reason to suspect patient   may HAVE or may have been EXPOSED to COVID-19: No; Provide specific details   for suspecting patient may HAVE or have been EXPOSED to COVID-19: ;  Primary Impression: Abdominal Pain/Problems;  Abdominal pain    Medical History: ; Pt Medication: ; Pt Allergies: Tramadol, ;  Pt has had abd pain for approximately 4-5 days. Pt wants to go to er     Mental Status Assessment: Normal Baseline for Patient; Neurological   Assessment: Normal Baseline for Patient;     Pt sitting up alert x 3. Pt with BLS 7. [facility]   with [facility] pains. Pt has a sharp constant pain that has   started and pt does not know what it is. No nausea. No vomiting or   diarrhea. Pt walked out to EMS with no major distress. Pt has had gastric   bypass with no issues.     POC     Base Called: [place]  Code [address] [place]    CC:  Date/Time of Symptom Onset: 2020-09-28T00:00:00.000-07:00    HPI:  Primary Symptom: Generalized abdominal pain  Provider's Primary Impression: Generalized abdominal pain  Provider's Secondary Impressions: Generalized abdominal pain  Initial Patient Acuity: Lower Acuity Nyoka Cowden)    Alert:  Patient Care Report Number: [id number]  Incident Number: [id number]  EMS Vehicle (Unit) Number: [id number]  EMS Unit Call Sign: [id number]  Level of Care of This Unit: ALS-Paramedic  Incident Location Type: Unspecified place or not applicable    Assessment:  Heart Assessment: Normal  Mental Status Assessment: Normal Baseline for Patient    Procedure - Arrest:  Cardiac Arrest: No    Procedure - Exam:    Procedure - Injury:    Procedure - Airway:    Procedure - Medications:    Procedure - Generic:  Date/Time Procedure Performed: 2020-09-28T00:00:00.000-07:00  Procedure Performed Prior to this Unit's EMS Care: Yes  Procedure: [id number]  Date/Time Procedure Performed: 2020-09-28T00:00:00.000-07:00  Procedure Performed Prior to this Unit's EMS Care: No  Procedure:  [id number]    Demographics History:  Medication Allergies: 16384    Demographics Practitioner:    Demographics Patient:  Last Name: Proctor  First Name: Alexandria  Patient's Home Address: [facility]: [phone]  Gender: Female  Age: 63  Age Units: Years    Demographics Times:  Arrived at Patient Date/Time: 2020-09-28T00:51:15.000-07:00    Demographics Payment:  Primary Method of Payment: Self Pay

## 2019-06-20 NOTE — ED Notes (Signed)
US team at bedside for IV placement

## 2019-06-22 LAB — URINE CULTURE: Urine Culture Result: <10000 — AB

## 2019-06-22 NOTE — Progress Notes (Signed)
ED Pharmacist Culture Follow Up Note  Result Type: Urine Culture     Based on the new results below, the patient's current treatment plan: Other  - Patient presented to ED for abdominal pain, CT showed no acute findings and surgical intervention not warranted.  - Urine cx grew Morganella. Attempted to call patient to ask if she had any urinary symptoms, but unable to reach. If patient calls back and reports urinary symptoms, recommend to start ciprofloxacin or bactrim for UTI.     Response: Unable to contact (patient/caregiver); letter mailed with instructions to contact [facility] regarding results    Sent letter to patient     Markham Jordan, PharmD, BCPS        The following culture results have been reviewed:    Microbiology Results (last 7 days)     Procedure Component Value - Date/Time    Urine Culture Urine [[id number]]  (Abnormal)  (Susceptibility) Collected: 06/20/19 1119    Lab Status: Final result Specimen: Urine Updated: 06/22/19 1021     Urine Culture Result <10,000 CFU/mL Mixed Urogenital Flora     Urine Culture Morganella morganii  10,000 - 50,000 CFU/mL  Identification performed by [employer] [facility]( [facility]). This test  was developed and its performance characteristics determined by [facility]. It has not been cleared or  approved by the U.S. Food and Drug Administration.  The FDA has  determined that such clearance or approval is not necessary.      Susceptibility      Morganella morganii     MIC     Amikacin <=8 mcg/mL Susceptible [1]      Ampicillin >16 mcg/mL Resistant [2]      Ampicillin/Sulbactam >16/8 mcg/mL Resistant [2]      Cefazolin >16 mcg/mL Resistant [3]      Cefepime <=1 mcg/mL Susceptible [3]      Cefoxitin 8 mcg/mL Susceptible [3]      Ceftazidime >16 mcg/mL Resistant [3]      Ceftriaxone 32 mcg/mL Resistant [3]      Ciprofloxacin <=0.5 mcg/mL Susceptible [3]      Ertapenem <=0.25 mcg/mL Susceptible [3]      Gentamicin <=2 mcg/mL Susceptible [1]     Meropenem <=0.5 mcg/mL Susceptible [3]      Nitrofurantoin 64 mcg/mL Resistant [1]      Piperacillin/Tazobactam >64/4 mcg/mL Resistant [2]      Tobramycin <=2 mcg/mL Susceptible [1]      Trimethoprim/Sulfamethoxazole <=0.5/9.5 mcg/mL Susceptible [2]             [1]   Low risk of C. diff infection     [2]   MODERATE risk of C. diff infection     [3]   HIGH risk of C. diff infection

## 2019-06-28 ENCOUNTER — Telehealth (INDEPENDENT_AMBULATORY_CARE_PROVIDER_SITE_OTHER): Payer: Self-pay | Admitting: Physician Assistant

## 2019-06-28 MED ORDER — METHOCARBAMOL 750 MG OR TABS
750.0000 mg | ORAL_TABLET | Freq: Four times a day (QID) | ORAL | 0 refills | Status: DC | PRN
Start: 2019-06-28 — End: 2019-07-25

## 2019-06-28 MED ORDER — METHOCARBAMOL 750 MG OR TABS
750.00 mg | ORAL_TABLET | ORAL | Status: DC
Start: 2017-11-11 — End: 2019-06-28

## 2019-07-06 ENCOUNTER — Emergency Department
Admission: EM | Admit: 2019-07-06 | Discharge: 2019-07-06 | Disposition: A | Discharge status: Home / Self Care | Payer: Medicare Other | Attending: Emergency Medicine | Admitting: Emergency Medicine

## 2019-07-06 DIAGNOSIS — J449 Chronic obstructive pulmonary disease, unspecified: Secondary | ICD-10-CM | POA: Insufficient documentation

## 2019-07-06 DIAGNOSIS — Z90710 Acquired absence of both cervix and uterus: Secondary | ICD-10-CM | POA: Insufficient documentation

## 2019-07-06 DIAGNOSIS — F1290 Cannabis use, unspecified, uncomplicated: Secondary | ICD-10-CM | POA: Insufficient documentation

## 2019-07-06 DIAGNOSIS — E785 Hyperlipidemia, unspecified: Secondary | ICD-10-CM | POA: Insufficient documentation

## 2019-07-06 DIAGNOSIS — F1110 Opioid abuse, uncomplicated: Secondary | ICD-10-CM | POA: Insufficient documentation

## 2019-07-06 DIAGNOSIS — R109 Unspecified abdominal pain: Secondary | ICD-10-CM | POA: Insufficient documentation

## 2019-07-06 DIAGNOSIS — F17210 Nicotine dependence, cigarettes, uncomplicated: Secondary | ICD-10-CM | POA: Insufficient documentation

## 2019-07-06 DIAGNOSIS — Z8249 Family history of ischemic heart disease and other diseases of the circulatory system: Secondary | ICD-10-CM | POA: Insufficient documentation

## 2019-07-06 DIAGNOSIS — F319 Bipolar disorder, unspecified: Secondary | ICD-10-CM | POA: Insufficient documentation

## 2019-07-06 DIAGNOSIS — Z79899 Other long term (current) drug therapy: Secondary | ICD-10-CM | POA: Insufficient documentation

## 2019-07-06 DIAGNOSIS — Z888 Allergy status to other drugs, medicaments and biological substances status: Secondary | ICD-10-CM | POA: Insufficient documentation

## 2019-07-06 DIAGNOSIS — G8929 Other chronic pain: Secondary | ICD-10-CM | POA: Insufficient documentation

## 2019-07-06 DIAGNOSIS — Z885 Allergy status to narcotic agent status: Secondary | ICD-10-CM | POA: Insufficient documentation

## 2019-07-06 DIAGNOSIS — F259 Schizoaffective disorder, unspecified: Secondary | ICD-10-CM | POA: Insufficient documentation

## 2019-07-06 DIAGNOSIS — F1910 Other psychoactive substance abuse, uncomplicated: Secondary | ICD-10-CM | POA: Insufficient documentation

## 2019-07-06 DIAGNOSIS — I10 Essential (primary) hypertension: Secondary | ICD-10-CM | POA: Insufficient documentation

## 2019-07-06 DIAGNOSIS — F1510 Other stimulant abuse, uncomplicated: Secondary | ICD-10-CM | POA: Insufficient documentation

## 2019-07-06 DIAGNOSIS — Z9884 Bariatric surgery status: Secondary | ICD-10-CM | POA: Insufficient documentation

## 2019-07-06 DIAGNOSIS — E119 Type 2 diabetes mellitus without complications: Secondary | ICD-10-CM | POA: Insufficient documentation

## 2019-07-06 DIAGNOSIS — E559 Vitamin D deficiency, unspecified: Secondary | ICD-10-CM | POA: Insufficient documentation

## 2019-07-06 DIAGNOSIS — I714 Abdominal aortic aneurysm, without rupture: Secondary | ICD-10-CM | POA: Insufficient documentation

## 2019-07-06 DIAGNOSIS — Z818 Family history of other mental and behavioral disorders: Secondary | ICD-10-CM | POA: Insufficient documentation

## 2019-07-06 DIAGNOSIS — F209 Schizophrenia, unspecified: Secondary | ICD-10-CM | POA: Insufficient documentation

## 2019-07-06 DIAGNOSIS — Z6836 Body mass index (BMI) 36.0-36.9, adult: Secondary | ICD-10-CM | POA: Insufficient documentation

## 2019-07-06 DIAGNOSIS — E669 Obesity, unspecified: Secondary | ICD-10-CM | POA: Insufficient documentation

## 2019-07-06 DIAGNOSIS — R45851 Suicidal ideations: Secondary | ICD-10-CM | POA: Insufficient documentation

## 2019-07-06 LAB — CBC WITH DIFF, BLOOD
ANC-Automated: 4 10*3/uL (ref 1.6–7.0)
Abs Basophils: 0 10*3/uL
Abs Eosinophils: 0.3 10*3/uL (ref 0.0–0.5)
Abs Lymphs: 1.5 10*3/uL (ref 0.8–3.1)
Abs Monos: 0.7 10*3/uL (ref 0.2–0.8)
Basophils: 0 %
Eosinophils: 5 %
Hct: 34.5 % (ref 34.0–45.0)
Hgb: 11.1 gm/dL — ABNORMAL LOW (ref 11.2–15.7)
Lymphocytes: 22 %
MCH: 26.4 pg (ref 26.0–32.0)
MCHC: 32.2 g/dL (ref 32.0–36.0)
MCV: 82.1 um3 (ref 79.0–95.0)
MPV: 8.7 fL — ABNORMAL LOW (ref 9.4–12.4)
Monocytes: 10 %
Plt Count: 262 10*3/uL (ref 140–370)
RBC: 4.2 10*6/uL (ref 3.90–5.20)
RDW: 17.2 % — ABNORMAL HIGH (ref 12.0–14.0)
Segs: 62 %
WBC: 6.5 10*3/uL (ref 4.0–10.0)

## 2019-07-06 LAB — COMPREHENSIVE METABOLIC PANEL, BLOOD
ALT (SGPT): 19 U/L (ref 0–33)
AST (SGOT): 19 U/L (ref 0–32)
Albumin: 3.8 g/dL (ref 3.5–5.2)
Alkaline Phos: 78 U/L (ref 35–140)
Anion Gap: 9 mmol/L (ref 7–15)
BUN: 13 mg/dL (ref 8–23)
Bicarbonate: 29 mmol/L (ref 22–29)
Bilirubin, Tot: 0.2 mg/dL (ref <1.2)
Calcium: 9.2 mg/dL (ref 8.5–10.6)
Chloride: 106 mmol/L (ref 98–107)
Creatinine: 0.97 mg/dL — ABNORMAL HIGH (ref 0.51–0.95)
GFR: >60 mL/min
Glucose: 83 mg/dL (ref 70–99)
Potassium: 3.8 mmol/L (ref 3.5–5.1)
Sodium: 144 mmol/L (ref 136–145)
Total Protein: 6.2 g/dL (ref 6.0–8.0)

## 2019-07-06 LAB — URINALYSIS WITH CULTURE REFLEX, WHEN INDICATED
Bilirubin: NEGATIVE
Blood: NEGATIVE
Glucose: NEGATIVE
Ketones: NEGATIVE
Leuk Esterase: 25 Leu/uL — AB
Nitrite: NEGATIVE
Protein: NEGATIVE
Specific Gravity: 1.015 (ref 1.002–1.030)
Urobilinogen: NEGATIVE
pH: 6 (ref 5.0–8.0)

## 2019-07-06 LAB — LIPASE, BLOOD: Lipase: 18 U/L (ref 13–60)

## 2019-07-06 LAB — LACTATE, BLOOD: Lactate: 0.8 mmol/L (ref 0.5–2.0)

## 2019-07-06 MED ORDER — MORPHINE SULFATE 4 MG/ML IJ SOLN
4.0000 mg | Freq: Once | INTRAMUSCULAR | Status: AC
Start: 2019-07-06 — End: 2019-07-06
  Administered 2019-07-06: 4 mg via INTRAVENOUS
  Filled 2019-07-06: qty 1

## 2019-07-06 MED ORDER — SODIUM CHLORIDE 0.9 % IV SOLN
12.5000 mg | Freq: Once | INTRAVENOUS | Status: AC
Start: 2019-07-06 — End: 2019-07-06
  Administered 2019-07-06 (×2): 12.5 mg via INTRAVENOUS
  Filled 2019-07-06: qty 0.5

## 2019-07-06 NOTE — ED Provider Notes (Signed)
Emergency Department Provider Note  The Date of Service for the Emergency Room encounter is 07/06/2019  3:30 PM   Patient: Alexandria Proctor, MRN [id number], DOB 1956-05-03  Primary MD: Robet Leu [facility]  Chief Complaint   Patient presents with   . Abdominal Pain     pt BIBA from home per report pt was seen here x1 one week ago for similar pain, pt endorsing abd pain "around my naval" that has progressively gotten worse over the past week.  endorsing nausea no vomiting.  denies urinary s/s         HPI: Alexandria Proctor is a 63 year old female with pmh significant for AAA measuring 3.1 cm, multiple abdominal surgeries, DM, COPD, bipolar, polysubstance abuse who presents to the ED for evaluation of abdominal pain. The pain is stabbing, constant, and located at the umbilicus with radiation to her R side. Pt takes lyrica at home which does not help with the pain. According to the pt, the pain is unchanged from her last ED visit on 9/28, at which time she received a thorough workup including a CT A/P which showed no evidence of acute abdominal pathology. Pt reports the pain is associated with nausea and loose bowel movements, but no vomiting.    Pt denies fever, chills, cough, SOB, chest pain, dysuria.      Review of Systems   Review of Systems Positives in bold  Constitutional: diaphoresis, chills and fever.   HENT: congestion and rhinorrhea.    Eyes: vision changes, eye pain  Respiratory: cough and shortness of breath.    Cardiovascular: chest pain, palpitations and leg swelling.   Gastrointestinal: abdominal distention, abdominal pain, constipation, diarrhea, nausea and vomiting.   GU: dysuria, hematuria  MSK: edema, myalgias  Skin: rash and wound.   Neurological: dizziness, syncope, weakness, light-headedness and headaches.   Psychiatric/Behavioral: confusion and sleep disturbance.     Home Medications:  Prior to Admission Medications   Prescriptions Last Dose Informant Patient Reported? Taking?     LYRICA 200 MG capsule   No No   Sig: Take 1 capsule (200 mg) by mouth 2 times daily.   atorvastatin (LIPITOR) 40 MG tablet   No No   Sig: Take 1 tablet (40 mg) by mouth every evening.   cloNIDine (CATAPRES) 0.2 MG/24HR patch   Yes No   methocarbamol (ROBAXIN) 750 MG tablet   No No   Sig: Take 1 tablet (750 mg) by mouth 4 times daily as needed for Muscle Spasms.      Facility-Administered Medications: None       Allergies:Compazine, Gabapentin, Haldol [haloperidol], Ondansetron, Reglan [metoclopramide], Ultram [tramadol], Zofran [ondansetron], and Toradol    Past Medical & Surgical History:  Past Medical History:   Diagnosis Date   . Amphetamine abuse (CMS-HCC)    . Aortic aneurysm (CMS-HCC)    . Bipolar 1 disorder (CMS-HCC)    . Bipolar affective (CMS-HCC)    . COPD (chronic obstructive pulmonary disease) (CMS-HCC)    . Diabetes mellitus (CMS-HCC)    . Fracture, intertrochanteric, left femur (CMS-HCC)    . Gastric bypass status for obesity    . H/O: hysterectomy    . HLD (hyperlipidemia)    . HTN (hypertension)    . Manic depression (CMS-HCC)    . Obesity    . Opioid abuse (CMS-HCC)    . Polyarthropathy or polyarthritis of multiple sites    . Schizophrenia (CMS-HCC)      Past Surgical  History:   Procedure Laterality Date   . GASTRIC BYPASS     . GASTRIC BYPASS     . hx gastric bypass     . s/p IMN left IT fracture 07/20/16     . STOMACH SURGERY     . Ventral Hernia repair         Family History:  Family History   Problem Relation Name Age of Onset   . Schizophrenia Mother     . Hypertension Mother         Social History:  Social History     Tobacco Use   . Smoking status: Current Every Day Smoker     Packs/day: 1.00     Types: Cigarettes   . Smokeless tobacco: Never Used   Substance Use Topics   . Alcohol use: Yes     Alcohol/week: 0.0 standard drinks   . Drug use: Yes     Types: Methamphetamines, Marijuana       Physical Exam -- Vital signs reviewed and noted below. Nursing notes reviewed.  Vitals:    07/06/19  1607   BP: 114/75   BP Location: Left arm   BP Patient Position: Semi-Fowlers   Pulse: 60   Resp: 18   SpO2: 98%       Constitutional: Well developed, well nourished, pt writhing in pain  HEENT: Normocephalic, no diaphoresis, PERRL, conjunctiva normal  Neck: Ranging comfortably, no asymmetry  Resp: No distress, speaking in full clear sentences, CTAB  CV: Regular rate, regular rhythm  GI: TTP over umbilicus, distended  MSK: No edema, no gross deformities  Skin: Warm, dry  Neuro: No facial asymmetry, moving all four extremities spontaneously  Psych: Mentation normal, behavior appropriate    IMAGING  The following radiological studies were ordered:  No orders of the defined types were placed in this encounter.      Assessment and Plan      MDM    DDX:  AAA rupture, pancreatitis, GERD, appendicitis, gastroenteritis, chronic abdominal pain, MI    Notable Physical Exam Findings:  TTP over umbilicus, lung CTAB    Vitals:  AFVSS    Pt is a 63 year old female with history as above who presents with complaints of 10/10 stabbing abdominal pain located near her umbilicus, unchanged from her abdominal pain when seen in this ED on September 28. Patient reports taking Lyrica at home for pain management, which does not help with the pain.  Patient had further workup including CT scan during last ED visit which was largely unremarkable.  Labs during this ED visit were largely unremarkable, including a white blood cell count elevation, no lipase elevation and no signs of UTI.  Lactate was 0.8.  Given the unchanged nature of patient's abdominal pain since her prior workup under unconcerning lab findings, decision was made to not pursue further CT imaging at this time.  Patient was given morphine for pain control and Phenergan for nausea control in the ED.  Patient reported minimal pain control, was seen sleeping comfortably in the hallway after administration of pain medicine.  Patient was discharged home with instructions for prompt  follow-up with PCP for further management of her ongoing abdominal pain and with strict return precautions.  Patient instructed to take Tylenol at home for further pain management as needed.    ED Course  .  Workup Review as of Jul 05 2000   Gustavus Messing Alexander's Documentation   Wed Jul 06, 2019   1838  Pt seen sleeping comfortably in hallway after administration of pain medication      1824 Lipase: 18   1816 Lactate: 0.8   1807 WBC: 6.5             The differential diagnosis and aftercare instructions were discussed with the patient in detail who verbalized understanding and appreciation as well as RTED precautions. Additional verbal instructions specific to diagnosis and differential were discussed at length with patient.      Patient seen and discussed with ED attending, Dr. Rayann HemanNadolski  Xzander Gilham, MD  [place] [facility] Resident                Lendon George, Randell PatientJason Alexander, MD  Resident  07/06/19 2004       Tommie RaymondNadolski, Adam M, MD  07/08/19 [id number]

## 2019-07-06 NOTE — ED Notes (Signed)
MD at bedside for US guided PIV.

## 2019-07-06 NOTE — ED EKG Interpretation (Signed)
ED EKG Interpretation    EKG:  Sinus bradycardia at a rate of 54 beats per minute, normal axis, normal intervals.  No ST T wave changes.

## 2019-07-06 NOTE — ED Notes (Signed)
Per Harrington Challenger RN - PIV placed and blood work sent by [employer]

## 2019-07-06 NOTE — ED Notes (Signed)
MD Medak to patient bedside to assist with PIV insertion.

## 2019-07-06 NOTE — ED Notes (Signed)
DC instructions given to pt. Verb understanding. Id band removed. Sl dc,  Taxi provided.

## 2019-07-06 NOTE — Discharge Instructions (Signed)
Follow up with your PCP for further management of your ongoing abdominal pain. Return to the ED if you develop any new or concerning symptoms, including but not limited to intractable vomiting, intolerable and worsening abdominal pain, or bloody stools.

## 2019-07-06 NOTE — ED Notes (Signed)
MD at bedside for additional attempt for US guided PIV

## 2019-07-07 ENCOUNTER — Emergency Department
Admission: EM | Admit: 2019-07-07 | Discharge: 2019-07-07 | Disposition: A | Discharge status: Home / Self Care | Payer: Medicare Other | Attending: Emergency Medicine | Admitting: Emergency Medicine

## 2019-07-07 DIAGNOSIS — R109 Unspecified abdominal pain: Secondary | ICD-10-CM

## 2019-07-07 DIAGNOSIS — R45851 Suicidal ideations: Secondary | ICD-10-CM

## 2019-07-07 LAB — ECG 12-LEAD
ATRIAL RATE: 54 {beats}/min
P AXIS: 68 degrees
PR INTERVAL: 176 ms
QRS INTERVAL/DURATION: 100 ms
QT: 450 ms
QTC INTERVAL: 426 ms
R AXIS: 13 degrees
T AXIS: 40 degrees
VENTRICULAR RATE: 54 {beats}/min

## 2019-07-07 MED ORDER — ALUM & MAG HYDROXIDE-SIMETH 200-200-20 MG/5ML OR SUSP
30.0000 mL | Freq: Once | ORAL | Status: AC
Start: 2019-07-07 — End: 2019-07-07
  Administered 2019-07-07: 02:00:00 30 mL via ORAL
  Filled 2019-07-07: qty 30

## 2019-07-07 MED ORDER — METHOCARBAMOL 750 MG OR TABS
750.0000 mg | ORAL_TABLET | Freq: Once | ORAL | Status: AC
Start: 2019-07-07 — End: 2019-07-07
  Administered 2019-07-07 (×2): 750 mg via ORAL
  Filled 2019-07-07: qty 1

## 2019-07-07 MED ORDER — PROMETHAZINE HCL 25 MG OR TABS
25.0000 mg | ORAL_TABLET | Freq: Once | ORAL | Status: AC
Start: 2019-07-07 — End: 2019-07-07
  Administered 2019-07-07: 03:00:00 25 mg via ORAL
  Filled 2019-07-07: qty 1

## 2019-07-07 MED ORDER — LIDOCAINE VISCOUS 2 % MT SOLN
10.0000 mL | Freq: Once | OROMUCOSAL | Status: AC
Start: 2019-07-07 — End: 2019-07-07
  Administered 2019-07-07: 10 mL via ORAL
  Filled 2019-07-07: qty 15

## 2019-07-07 MED ORDER — ALUM & MAG HYDROXIDE-SIMETH 200-200-20 MG/5ML OR SUSP
30.0000 mL | Freq: Once | ORAL | Status: AC
Start: 2019-07-07 — End: 2019-07-07
  Administered 2019-07-07: 30 mL via ORAL
  Filled 2019-07-07: qty 30

## 2019-07-07 MED ORDER — SUCRALFATE 1 GM/10ML OR SUSP
1.0000 g | Freq: Once | ORAL | Status: AC
Start: 2019-07-07 — End: 2019-07-07
  Administered 2019-07-07 (×2): 1 g via ORAL
  Filled 2019-07-07: qty 10

## 2019-07-07 MED ORDER — PREGABALIN 100 MG OR CAPS
200.0000 mg | ORAL_CAPSULE | Freq: Once | ORAL | Status: AC
Start: 2019-07-07 — End: 2019-07-07
  Administered 2019-07-07: 200 mg via ORAL
  Filled 2019-07-07: qty 2

## 2019-07-07 MED ORDER — SUCRALFATE 1 GM/10ML OR SUSP
1.0000 g | Freq: Once | ORAL | Status: DC
Start: 2019-07-07 — End: 2019-07-07
  Filled 2019-07-07: qty 10

## 2019-07-07 NOTE — ED Notes (Signed)
Assisting primary RN:  Pt resting with eyes closed, rr even/nonlabored, pt with difficulty waking for PO medication  Will re attempt when pt awake

## 2019-07-07 NOTE — ED Notes (Signed)
Patient placed under observation of security while awaiting bed assignment.

## 2019-07-07 NOTE — ED Notes (Signed)
This writer breaking sitter, report received from CCP Angelina

## 2019-07-07 NOTE — ED Notes (Signed)
Bed: 04B  Expected date:   Expected time:   Means of arrival:   Comments:  Triage SI

## 2019-07-07 NOTE — ED Notes (Signed)
This RN made first contact with patient. Pt in high fowlers position. Room cleared of all harmful objects, no telemetry cables in room. Pt in psychiatric gown/pants. Sitter at bedside for direct observation. Pt in direct line of sight of sitter. Sitter informed to notify this RN/staff if there are any issues. Sitter verbalizes understanding.    -Sitter aware of legal status: Volentary patient may leave at any time as per with Dr.Bisanz    -Sitter aware to notify this RN for any assistance

## 2019-07-07 NOTE — ED Notes (Signed)
Pt tolerated PO meds well. Pt has sprite at bedside with ice that she is drinking. Pt reports nausea, Dr Kyra Leyland notified. Pt given warm blanket. Pt A/Ox4. Skin AWD. RR even and unlabored. NAD.

## 2019-07-07 NOTE — ED Notes (Signed)
Pt sleeping in stretcher. RR even and unlabored. NAD

## 2019-07-07 NOTE — ED Notes (Signed)
Bed: 04B  Expected date:   Expected time:   Means of arrival:   Comments:

## 2019-07-07 NOTE — ED Provider Notes (Signed)
Emergency Department Note   electronic medical record reviewed for pertinent medical history.     Patient: Alexandria FallsHELEN V Mcewan, MRN [id number], DOB 10/16/1955  The Date of Service for the Emergency Room encounter is 07/07/2019 12:37 AM     Nursing Triage Note :   Chief Complaint   Patient presents with   . Psychiatric Problem     Patient to ed just recently discharged a couple hours ago. States that she is now hearing voices that are telling her to kill herself and jump in front of traffic        HPI :   Alexandria Proctor is a 63 year old female with PMHx including many abdominal surgeries, SBO, sleeve gastrectomy, other medical history as below.  She presents today shortly after discharge for continued abdominal pain predominantly periumbilical and epigastric with nausea without significant vomiting.  She just ate a bag of noodles just prior to being seen by me.  She was seen here and had labs which were unremarkable and noncontributory to the cause of her abdominal pain.  Well-appearing currently.  Has a history AAA but only 3.1 cm.  She received morphine and was able to endorse improvement and was discharged.  Currently she returns complaining hearing auditory command hallucinations telling her to kill herself with some somewhat of a plan to jump in front of traffic.      Past Medical History :   Past Medical History:   Diagnosis Date   . Amphetamine abuse (CMS-HCC)    . Aortic aneurysm (CMS-HCC)    . Bipolar 1 disorder (CMS-HCC)    . Bipolar affective (CMS-HCC)    . COPD (chronic obstructive pulmonary disease) (CMS-HCC)    . Diabetes mellitus (CMS-HCC)    . Fracture, intertrochanteric, left femur (CMS-HCC)    . Gastric bypass status for obesity    . H/O: hysterectomy    . HLD (hyperlipidemia)    . HTN (hypertension)    . Manic depression (CMS-HCC)    . Obesity    . Opioid abuse (CMS-HCC)    . Polyarthropathy or polyarthritis of multiple sites    . Schizophrenia (CMS-HCC)        Past Surgical history :   Past  Surgical History:   Procedure Laterality Date   . GASTRIC BYPASS     . GASTRIC BYPASS     . hx gastric bypass     . s/p IMN left IT fracture 07/20/16     . STOMACH SURGERY     . Ventral Hernia repair         Family History:   Family History   Problem Relation Name Age of Onset   . Schizophrenia Mother     . Hypertension Mother         Social History:  Social History     Tobacco Use   . Smoking status: Current Every Day Smoker     Packs/day: 1.00     Types: Cigarettes   . Smokeless tobacco: Never Used   Substance Use Topics   . Alcohol use: Yes     Alcohol/week: 0.0 standard drinks   . Drug use: Yes     Types: Methamphetamines, Marijuana     Alcohol/Tobacco/Illicits:   Past medical, family, and social histories reviewed and verified by me.    Medications:   Prior to Admission Medications   Prescriptions Last Dose Informant Patient Reported? Taking?   LYRICA 200 MG capsule   No No   Sig:  Take 1 capsule (200 mg) by mouth 2 times daily.   atorvastatin (LIPITOR) 40 MG tablet   No No   Sig: Take 1 tablet (40 mg) by mouth every evening.   cloNIDine (CATAPRES) 0.2 MG/24HR patch   Yes No   methocarbamol (ROBAXIN) 750 MG tablet   No No   Sig: Take 1 tablet (750 mg) by mouth 4 times daily as needed for Muscle Spasms.      Facility-Administered Medications: None       Allergies: Compazine, Gabapentin, Haldol [haloperidol], Ondansetron, Reglan [metoclopramide], Ultram [tramadol], Zofran [ondansetron], and Toradol    Review of Systems:  All other systems reviewed and negative unless otherwise noted in the HPI or above. This was done per my custom and practice for systems appropriate to the chief complaint in an emergency department setting and varies depending on the quality of history that the patient is able to provide.            Physical Exam:   07/06/19  2355   BP: 115/70   Pulse: 62   Resp: 16   Temp: 98.2 F (36.8 C)   SpO2: 98%       Nursing note and vitals reviewed. Pt not hypoxic.  Gen: Patient is in NAD, non-toxic  appearing, cooperative, not in obvious distress, sleeping calmly.  HEENT: NC/AT, PERRL, moist mucous membranes, no conjunctival injection, b/l sclera anicteric, oropharynx clear  Neck: Supple. No midline tenderness. No meningeal signs.  Cardiovascular: RRR no m/r/g, normal heart sounds  Pulmonary/Chest: CTAB, no increased WOB, no respiratory distress, no wheezes, no chest wall tenderness.   Abdominal: Soft. ND, obese and nurses tenderness in the epigastric region but less tenderness when distracted.  Has former well-healed scars from prior surgery but no overlying skin changes.  Extr/MSK: Well perfused, distal pulses intact. No edema. No tenderness. FROM.  Back: No CVAT   Neuro: No evidence of facial droop, normal speech, mentation appropriate  Psychiatric: Normal affect. Mood not labile nor depressed.   Skin: No other rashes, lesions, or wounds.     ED Course & Clinical Decision Making:  63 year old  female with PMHx as listed in HPI presents for repeat assessment for abdominal pain that is periumbilical and epigastric, unchanged from prior abdominal pain earlier today however now she is complaining suicidal ideation and command hallucinations stating that she wants to jump into traffic.  Patient states that she is nauseous but prior to assessment had just finished a bag of noodles.  Appears well and frequently sleeping upon reassessment.  When patient presented several weeks ago with similar complaints and physical exam findings a CT scan was performed at the discretion of General surgery was consulted given her history of sleeve gastrectomy. No acute findings at that time.     I spoke with surgery and today and they do not find any ability in repeating imaging given the similarity of findings.  Patient continues to take p.o..  Seen earlier today and had improvement with morphine but not complete resolution.    Vitals: stable/ afebrile    No indication for repeat labs as they were performed approximately 7 hours  ago and unrevealing.  Having bowel movements the for low likelihood SBO.    Very low clinical suspicion of life threatening cause of abdominal pain or surgical emergency, such as AAA, aortic dissection, acute mesenteric ischemia, peritonitis, perforated viscous, cholecystitis, cholangitis, fulminant hepatic failure, hemorrhagic/necrotizing pancreatitis, appendicitis, diverticulitis, complete SBO, or torsion.    Orders Placed  This Encounter   Procedures   . Urine Immunoassay Drug Screen   . IP Consult to Psychiatry     Medications   pregabalin (LYRICA) capsule 200 mg (0 mg Oral Hold 07/07/19 0340)   methocarbamol (ROBAXIN) tablet 750 mg (0 mg Oral Hold 07/07/19 0340)   aluminum-magnesium-simethicone (MAG-AL PLUS) 200-200-20 MG/5ML suspension 30 mL (has no administration in time range)   lidocaine (XYLOCAINE) 2 % viscous solution 10 mL (has no administration in time range)   aluminum-magnesium-simethicone (MAG-AL PLUS) 200-200-20 MG/5ML suspension 30 mL (30 mL Oral Given 07/07/19 0204)   lidocaine (XYLOCAINE) 2 % viscous solution 10 mL (10 mL Oral Given 07/07/19 0203)   sucralfate (CARAFATE) suspension 1 g (1 g Oral Given 07/07/19 0203)   promethazine (PHENERGAN) tablet 25 mg (25 mg Oral Given 07/07/19 0317)       Diagnostic Imaging Studies  No orders to display     Labs Reviewed   URINE IMMUNOASSAY DRUG SCREEN     Workup Review as of Jul 06 621   [facility] Documentation   Thu Jul 07, 2019   8756 Pt reassessed, sleeping calmly        0129 Spoke with on-call surgery resident we 0 due to the patient's abdominal pain with history gastric bypass.  He states that there is nothing to do acutely and does not recall recommend any repeat imaging.  Recommends patient be referred for follow-up in MIS clinic to determine if there is any possible non emergent interventions that could be made.         Others' Documentation   Thu Jul 07, 2019   0616 S/o Vanessa Ralphs    63yo F here with abdominal pain and SI. Recent visit 2 weeks  ago ,and CT neg. Complicated surgical history. Surgery recs for no further workup. Tolerating PO. Do not suspect any acute process. No abdominal ttp. Level 2    To DO  Level 2  Dispo per psych    [CS]      Workup Review User Index  [CS] Kendrick Ranch, MD       MDM  Patient endorsed improvement with GI cocktail.  Still no vomiting, no obvious distress on exam. Stable vitals throughout ED stay here. Took PO. No distress. Most consistent with possible gastritis without perforation, active hemorrhage, or surgical emergency likely.  Surgery who we spoke to recommends patient following up in minimally invasive surgery clinic to determine if there are any other solutions to her abdominal pain such as intervention to gastritis.  Will start a PPI on likely discharge.    Patient continues to endorse active suicidal ideation and will require evaluation by Psychiatry.  Patient placed on a level to.  U tox ordered. Signed out to oncoming team.     I have discussed my thought process and plan for the patient with the attending physician, N[place], Tiffany Kocher, MD.           Versie Starks, MD  Resident  07/07/19 4332       Colin Rhein, MD  07/08/19 [phone]

## 2019-07-07 NOTE — Discharge Instructions (Addendum)
Your appointment is scheduled for:    Patient: Alexandria Proctor  Provider: Stephannie Li  Date: 07/12/2019  Time: 10:45AM  Location: Telehealth  Speciality: MENTAL HEALTH    Please be prepared to receive a call from your care team 15 minutes prior to your scheduled appointment time and have the following items ready:  Your Hospital Discharge Summary   Your medications prescribed from the Emergency Department, including other medications, vitamins or dietary supplements you are currently taking (in their original containers)   Your immunization card   Your health insurance card, if you have one  For more information about the [facility], please visit the [facility] website at [email or web address] or contact us at [phone]:  We look forward to seeing you soon and taking care of all your healthcare needs.  Appointment Instructions: Should you need to change or cancel your appointment, please call us at [phone]

## 2019-07-07 NOTE — ED Notes (Signed)
Pt appears to be resting comfortably in stretcher, skin AWD. RR Even and unlabored. NAD.

## 2019-07-07 NOTE — ED Notes (Addendum)
D/c info given, pt given bus pass, and sandwich, denies SI and HI, ambulated out with steady gait

## 2019-07-07 NOTE — ED MD Progress Note (Signed)
Workup Review as of Jul 07 723   Dorene Sorrow [facility] Documentation   Thu Jul 07, 2019   0616 S/o Bisanz    63yo F here with abdominal pain and SI. Recent visit 2 weeks ago ,and CT neg. Complicated surgical history. Surgery recs for no further workup. Tolerating PO. Do not suspect any acute process. No abdominal ttp. Level 2    To DO  Level 2  Dispo per psych         Others' Documentation   Thu Jul 07, 2019   0244 Pt reassessed, sleeping calmly      [BB]   0129 Spoke with on-call surgery resident we 0 due to the patient's abdominal pain with history gastric bypass.  He states that there is nothing to do acutely and does not recall recommend any repeat imaging.  Recommends patient be referred for follow-up in MIS clinic to determine if there is any possible non emergent interventions that could be made.    [BB]      Workup Review User Index  [BB] Margo Aye, MD
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
## 2019-07-07 NOTE — Interdisciplinary (Signed)
07/07/19 0914   Assessment   Assessment Type Discharge;Progress/Follow-up   Referral Information   Referral Type Discharge Planning;Community Resources/Referrals;Homeless;Supportive Counseling   Social Assessment   Where was the patient admitted from? * Homeless   Prior to Level of Function * Ambulatory/Independent with ADL's   Is Patient Minor? No   Interpreter Used? Not Needed   Social Determinates of Health   Living Arrangements * Homeless   A List of Community Resources and/or Shelters Provided Yes   Weather Appropriate Clothing Provided No - Environmental consultant Referral Not Applicable   Primary Care Provider Contacted ([place] Only) Patient Declined   Meal Provided Prior to Discharge Yes   Discharge Medications None Prescribed   Patient Reported Discharge Destination Shelter  (Pt is current service recipient of [employer] which includes shelter services.)   Homeless Discharge Documentation Complete CM/SW Completed   Available Assistance/Support System * Case manager/social worker   Type of Residence * Homeless   Has discharge transport been arranged? Yes   Transportation Arrangement Details * Pt will be offered a one-day bus pass or taxi voucher.   Transportation * Bus Pass   Patient/Family/Other Engaged in Discharge Planning * Yes   Patient/Family/Other Are In Agreement With Discharge [place] Retired/SS/Pension   [place] Affiliation No   Plans/Interventions/Discharge   Plan/Interventions Resources given   ED SW responded to Psych MD request to assist pt with aftercare resources.     ED SW reviewed chart before meeting with pt at bedside.  ED SW introduced herself and explained reason for bedside visit.  Pt agreed to receiving assistance with aftercare appointment with [facility]). Appointment info placed in pt DC instructions.  Pt is a  current service recipient of [employer] which includes shelter services. Pt expressed she would like to work with another provider [facility].  ED SW notified pt that [facility] has a clinic within [facility] and maybe [facility] can assist pt with initiating referral.      Pt accepted list of homeless resources as well as a list of mental health urgent care walk-in centers.  Pt provided a meal prior to DC. Pt is appropriately dressed.      Psych MD, ED MD and ED Nursing aware of above.     PLAN: ED SW to remain available as needed     Liana Crocker, [facility]  Cell phone: [phone]

## 2019-07-08 LAB — URINE CULTURE

## 2019-07-09 NOTE — EMS Narrative (Addendum)
Pt Age: 63 Years; Gender: Female; Is there any reason to suspect patient   may HAVE or may have been EXPOSED to COVID-19: No; Provide specific details   for suspecting patient may HAVE or have been EXPOSED to COVID-19: ;  Primary Impression: Abdominal Pain/Problems;  Abdominal pain.    Medical History: ; Pt Medication: ; Pt Allergies: NKA - No Known Allergies;  Patient (pt) states shes been experiencing abdominal pain for a week but it   got drastically worse today.     S/S: altered mental status; Date/Time: 07/06/2019 14:09:42; Mental Status:   Normal Baseline for Patient; Neuro: Normal Baseline for Patient;     On assessment ABCs were intact without need for intervention. Negative for   signs of trauma. Secondary unremarkable. Pt is AO X3. Pt MAE X4 without   deficit. Vital signs were assessed on scene and monitored throughout   transport.     Pt denies shortness of breath   Pt denies chest pain   Pt denies change in diet  Pt denies change in bowel movement  Pt denies possible pregnancy    POC. Missed IV attempt     Base Called: [place]  Patient was assisted to gurney by crew and placed in [place] for code 50 tx.   aspt. pt care was turned over without incident.    Comma failure    CC:  Date/Time of Symptom Onset: 2020-10-14T00:00:00.000-07:00    HPI:  Primary Symptom: Generalized abdominal pain  Other Associated Symptoms: Altered mental status, unspecified  Provider's Primary Impression: Generalized abdominal pain  Initial Patient Acuity: Lower Acuity Nyoka Cowden)    Alert:  Patient Care Report Number: [id number]  Incident Number: [id number]  EMS Vehicle (Unit) Number: [id number]  EMS Unit Call Sign: [id number]  Level of Care of This Unit: ALS-Paramedic  Incident Location Type: Unsp non-institutional (private) residence as place  Incident Street Address: [place]: [id number]  Incident ZIP Code: [id number]    Assessment:  Heart Assessment: Normal  Mental Status Assessment: Normal Baseline for Patient    Procedure -  Arrest:  Cardiac Arrest: No    Procedure - Exam:    Procedure - Injury:    Procedure - Airway:    Procedure - Medications:    Procedure - Generic:  Date/Time Procedure Performed: 2020-10-14T13:30:00.000-07:00  Procedure Performed Prior to this Unit's EMS Care: Yes  Procedure: [id number]  Date/Time Procedure Performed: 2020-10-14T13:30:00.000-07:00  Procedure Performed Prior to this Unit's EMS Care: Yes  Procedure: [id number]  Date/Time Procedure Performed: 2020-10-14T13:30:00.000-07:00  Procedure Performed Prior to this Unit's EMS Care: Yes  Procedure: [id number]    Demographics History:  Advance Directives: None  Environmental/Food Allergies: [id number]    Demographics Practitioner:    Demographics Patient:  Last Name: Proctor  First Name: Alexandria  Patient's Home Address: [facility]: [phone]  Gender: Female  Race: Black or African American  Age: 63  Age Units: Years    Demographics Times:  Unit Notified by Dispatch Date/Time: 2020-10-14T13:28:25.000-07:00  Unit En Route Date/Time: 2020-10-14T13:28:51.000-07:00  Unit Arrived on Scene Date/Time: 2020-10-14T13:47:39.000-07:00  Arrived at Patient Date/Time: 2020-10-14T00:00:00.000-07:00  Unit Left Scene Date/Time: 2020-10-14T14:06:34.000-07:00  Patient Arrived at Destination Date/Time: 2020-10-14T14:26:07.000-07:00    Demographics Payment:

## 2019-07-11 ENCOUNTER — Observation Stay
Admission: EM | Admit: 2019-07-11 | Discharge: 2019-07-14 | Disposition: A | Discharge status: Home / Self Care | Payer: Medicare Other | Attending: Hospitalist | Admitting: Hospitalist

## 2019-07-11 ENCOUNTER — Emergency Department (HOSPITAL_BASED_OUTPATIENT_CLINIC_OR_DEPARTMENT_OTHER): Payer: Medicare Other

## 2019-07-11 DIAGNOSIS — Z818 Family history of other mental and behavioral disorders: Secondary | ICD-10-CM | POA: Insufficient documentation

## 2019-07-11 DIAGNOSIS — Z9049 Acquired absence of other specified parts of digestive tract: Secondary | ICD-10-CM | POA: Insufficient documentation

## 2019-07-11 DIAGNOSIS — Z90710 Acquired absence of both cervix and uterus: Secondary | ICD-10-CM | POA: Insufficient documentation

## 2019-07-11 DIAGNOSIS — K314 Gastric diverticulum: Secondary | ICD-10-CM | POA: Insufficient documentation

## 2019-07-11 DIAGNOSIS — Z885 Allergy status to narcotic agent status: Secondary | ICD-10-CM | POA: Insufficient documentation

## 2019-07-11 DIAGNOSIS — R109 Unspecified abdominal pain: Principal | ICD-10-CM | POA: Insufficient documentation

## 2019-07-11 DIAGNOSIS — E559 Vitamin D deficiency, unspecified: Secondary | ICD-10-CM | POA: Insufficient documentation

## 2019-07-11 DIAGNOSIS — Z888 Allergy status to other drugs, medicaments and biological substances status: Secondary | ICD-10-CM | POA: Insufficient documentation

## 2019-07-11 DIAGNOSIS — G894 Chronic pain syndrome: Secondary | ICD-10-CM | POA: Insufficient documentation

## 2019-07-11 DIAGNOSIS — Z8249 Family history of ischemic heart disease and other diseases of the circulatory system: Secondary | ICD-10-CM | POA: Insufficient documentation

## 2019-07-11 DIAGNOSIS — K219 Gastro-esophageal reflux disease without esophagitis: Secondary | ICD-10-CM | POA: Insufficient documentation

## 2019-07-11 DIAGNOSIS — Z9884 Bariatric surgery status: Secondary | ICD-10-CM | POA: Insufficient documentation

## 2019-07-11 DIAGNOSIS — I10 Essential (primary) hypertension: Secondary | ICD-10-CM | POA: Diagnosis present

## 2019-07-11 DIAGNOSIS — Z800 Family history of malignant neoplasm of digestive organs: Secondary | ICD-10-CM | POA: Insufficient documentation

## 2019-07-11 DIAGNOSIS — Z79899 Other long term (current) drug therapy: Secondary | ICD-10-CM | POA: Insufficient documentation

## 2019-07-11 DIAGNOSIS — Z20828 Contact with and (suspected) exposure to other viral communicable diseases: Secondary | ICD-10-CM | POA: Insufficient documentation

## 2019-07-11 DIAGNOSIS — F209 Schizophrenia, unspecified: Secondary | ICD-10-CM | POA: Insufficient documentation

## 2019-07-11 DIAGNOSIS — E785 Hyperlipidemia, unspecified: Secondary | ICD-10-CM | POA: Diagnosis present

## 2019-07-11 DIAGNOSIS — E119 Type 2 diabetes mellitus without complications: Secondary | ICD-10-CM | POA: Insufficient documentation

## 2019-07-11 DIAGNOSIS — R439 Unspecified disturbances of smell and taste: Secondary | ICD-10-CM

## 2019-07-11 DIAGNOSIS — M159 Polyosteoarthritis, unspecified: Secondary | ICD-10-CM | POA: Insufficient documentation

## 2019-07-11 DIAGNOSIS — F17210 Nicotine dependence, cigarettes, uncomplicated: Secondary | ICD-10-CM | POA: Insufficient documentation

## 2019-07-11 DIAGNOSIS — J449 Chronic obstructive pulmonary disease, unspecified: Secondary | ICD-10-CM | POA: Insufficient documentation

## 2019-07-11 DIAGNOSIS — F319 Bipolar disorder, unspecified: Secondary | ICD-10-CM | POA: Insufficient documentation

## 2019-07-11 DIAGNOSIS — M797 Fibromyalgia: Secondary | ICD-10-CM | POA: Insufficient documentation

## 2019-07-11 DIAGNOSIS — Z9889 Other specified postprocedural states: Secondary | ICD-10-CM | POA: Insufficient documentation

## 2019-07-11 LAB — URINALYSIS WITH CULTURE REFLEX, WHEN INDICATED
Bilirubin: NEGATIVE
Blood: NEGATIVE
Glucose: NEGATIVE
Ketones: NEGATIVE
Leuk Esterase: 25 Leu/uL — AB
Nitrite: NEGATIVE
Protein: NEGATIVE
Specific Gravity: 1.024 (ref 1.002–1.030)
Urobilinogen: NEGATIVE
pH: 6.5 (ref 5.0–8.0)

## 2019-07-11 LAB — CBC WITH DIFF, BLOOD
ANC-Automated: 3.5 10*3/uL (ref 1.6–7.0)
Abs Basophils: 0 10*3/uL
Abs Eosinophils: 0.2 10*3/uL (ref 0.0–0.5)
Abs Lymphs: 1.5 10*3/uL (ref 0.8–3.1)
Abs Monos: 0.4 10*3/uL (ref 0.2–0.8)
Basophils: 0 %
Eosinophils: 4 %
Hct: 37.7 % (ref 34.0–45.0)
Hgb: 12 gm/dL (ref 11.2–15.7)
Lymphocytes: 27 %
MCH: 26.3 pg (ref 26.0–32.0)
MCHC: 31.8 g/dL — ABNORMAL LOW (ref 32.0–36.0)
MCV: 82.7 um3 (ref 79.0–95.0)
MPV: 9.4 fL (ref 9.4–12.4)
Monocytes: 7 %
Plt Count: 257 10*3/uL (ref 140–370)
RBC: 4.56 10*6/uL (ref 3.90–5.20)
RDW: 17 % — ABNORMAL HIGH (ref 12.0–14.0)
Segs: 62 %
WBC: 5.7 10*3/uL (ref 4.0–10.0)

## 2019-07-11 LAB — LACTATE, BLOOD: Lactate: 1.2 mmol/L (ref 0.5–2.0)

## 2019-07-11 LAB — COMPREHENSIVE METABOLIC PANEL, BLOOD
ALT (SGPT): 20 U/L (ref 0–33)
AST (SGOT): 21 U/L (ref 0–32)
Albumin: 4 g/dL (ref 3.5–5.2)
Alkaline Phos: 87 U/L (ref 35–140)
Anion Gap: 9 mmol/L (ref 7–15)
BUN: 14 mg/dL (ref 8–23)
Bicarbonate: 30 mmol/L — ABNORMAL HIGH (ref 22–29)
Bilirubin, Tot: 0.2 mg/dL (ref <1.2)
Calcium: 9 mg/dL (ref 8.5–10.6)
Chloride: 102 mmol/L (ref 98–107)
Creatinine: 0.99 mg/dL — ABNORMAL HIGH (ref 0.51–0.95)
GFR: >60 mL/min
Glucose: 81 mg/dL (ref 70–99)
Potassium: 3.9 mmol/L (ref 3.5–5.1)
Sodium: 141 mmol/L (ref 136–145)
Total Protein: 6.4 g/dL (ref 6.0–8.0)

## 2019-07-11 LAB — LIPASE, BLOOD: Lipase: 16 U/L (ref 13–60)

## 2019-07-11 MED ORDER — SODIUM CHLORIDE 0.9 % IV SOLN
Freq: Once | INTRAVENOUS | Status: AC
Start: 2019-07-11 — End: 2019-07-12
  Administered 2019-07-11: 20:00:00 via INTRAVENOUS

## 2019-07-11 MED ORDER — LIDOCAINE VISCOUS 2 % MT SOLN
10.0000 mL | Freq: Once | OROMUCOSAL | Status: AC
Start: 2019-07-11 — End: 2019-07-11
  Administered 2019-07-11: 18:00:00 10 mL via ORAL
  Filled 2019-07-11: qty 15

## 2019-07-11 MED ORDER — MORPHINE SULFATE 4 MG/ML IJ SOLN
4.0000 mg | Freq: Once | INTRAMUSCULAR | Status: AC
Start: 2019-07-11 — End: 2019-07-12
  Administered 2019-07-12: 4 mg via INTRAVENOUS
  Filled 2019-07-11: qty 1

## 2019-07-11 MED ORDER — ALUM & MAG HYDROXIDE-SIMETH 200-200-20 MG/5ML OR SUSP
30.0000 mL | Freq: Once | ORAL | Status: AC
Start: 2019-07-11 — End: 2019-07-11
  Administered 2019-07-11: 30 mL via ORAL
  Filled 2019-07-11: qty 30

## 2019-07-11 NOTE — ED Notes (Signed)
Report taken from [facility], patient care assumed now.  Pt in NAD, AOx4, MAEx4, speech full/clear, mobility ambulatory, mood calm/coop.  Pt here for abdominal pain x3 weeks worse today, plan for workup and assess.  Pt verbalizes agreement with plan of care.  Pt reactive to light touch to RQ abdomen, does not permit palpation by this RN.

## 2019-07-11 NOTE — ED Notes (Signed)
Pt to room at this time.   1st contact.

## 2019-07-11 NOTE — ED MD Progress Note (Signed)
S/o  10:44 PM  63 yo with abdominal pain, vomiting  Labs reassuring  Vitals stable  Failed PO trial, CT scan pending      To Do:  F/u CT scan,  Admit if unable to tolerate PO      Patient attempted multiple ultrasound-guided IVs as she had difficult access.  Ultimately unable to secure line.  A central line was placed to facilitate CT scan.  Surgery was consulted given history of gastric bypass, CT is been ordered, they will evaluate patient is there is any other concerning pathology that appear surgical in CT scan.  Patient will require admission to surgery versus medicine pending CT results.

## 2019-07-11 NOTE — ED Notes (Signed)
Pt eats half jello, half juice, declines to eat cracker, "my stomach hurts."

## 2019-07-11 NOTE — ED Notes (Signed)
Awaiting full orders to minimize possible exposure.

## 2019-07-11 NOTE — ED Notes (Signed)
Pt not in room.

## 2019-07-11 NOTE — ED Notes (Signed)
covid swab collected and sent to lab.   Iv access assessed, poor vasculature.    Pt states she always requires u/s guided and was here days ago requiring u/s guided iv.    Dr. Lyndel Safe notified and will attempt.

## 2019-07-11 NOTE — ED Notes (Signed)
Pt PIV not infusing, appears infiltrated. Plan for new PIV, med admin, complete NS infusion.

## 2019-07-11 NOTE — ED Notes (Signed)
md at bedside

## 2019-07-11 NOTE — ED Notes (Signed)
Bed: 24  Expected date:   Expected time:   Means of arrival:   Comments:  Amb: 40f abd pain

## 2019-07-11 NOTE — ED Notes (Signed)
X-ray at bedside

## 2019-07-11 NOTE — ED Provider Notes (Signed)
Emergency Department Note  [facility] electronic medical record reviewed for pertinent medical history.     Nursing Triage Note:   Chief Complaint   Patient presents with   . Abdominal Pain     [facility].  abd pain x 3 weeks.  n/v/d/body aches x 2 days.  states loss of taste and smell x 2 days.  fsg 84.         HPI:   63 year old female with a PMH significant for gastric bypass, SBO, bipolar, DM, COPD, who presents for abdominal pain for 3 weeks. Diarrhea and vomiting for a week. No blood in vomit or stool. Occasional headache. Had some mild SOB but no chest pain. Subjective fevers and chills for 1 week as well. She has been seen at this ED for similar complaints in the past 3 weeks. This time [facility] due to loss of smell or taste for covid rule out but on presentation to ED it was aparent that abdominal pain was her true concern.   No chest pain.   Fevers and chills subjective for a week   Unable to PO for past 2 days due to pain and discomfort.       HPI    Past Medical History:   Diagnosis Date   . Amphetamine abuse (CMS-HCC)    . Aortic aneurysm (CMS-HCC)    . Bipolar 1 disorder (CMS-HCC)    . Bipolar affective (CMS-HCC)    . COPD (chronic obstructive pulmonary disease) (CMS-HCC)    . Diabetes mellitus (CMS-HCC)    . Fracture, intertrochanteric, left femur (CMS-HCC)    . Gastric bypass status for obesity    . H/O: hysterectomy    . HLD (hyperlipidemia)    . HTN (hypertension)    . Manic depression (CMS-HCC)    . Obesity    . Opioid abuse (CMS-HCC)    . Polyarthropathy or polyarthritis of multiple sites    . Schizophrenia (CMS-HCC)        Past Surgical History:   Procedure Laterality Date   . GASTRIC BYPASS     . GASTRIC BYPASS     . hx gastric bypass     . s/p IMN left IT fracture 07/20/16     . STOMACH SURGERY     . Ventral Hernia repair         Family History:      Family History   Problem Relation Name Age of Onset   . Schizophrenia Mother     . Hypertension Mother          Social History:  -tob, -[place], lives with family in [place].     Social History     Tobacco Use   . Smoking status: Current Every Day Smoker     Packs/day: 1.00     Types: Cigarettes   . Smokeless tobacco: Never Used   Substance Use Topics   . Alcohol use: Yes     Alcohol/week: 0.0 standard drinks   . Drug use: Yes     Types: Methamphetamines, Marijuana       Medications:   Prior to Admission Medications   Prescriptions Last Dose Informant Patient Reported? Taking?   LYRICA 200 MG capsule   No No   Sig: Take 1 capsule (200 mg) by mouth 2 times daily.   atorvastatin (LIPITOR) 40 MG tablet   No No   Sig: Take 1 tablet (40 mg) by mouth every  evening.   cloNIDine (CATAPRES) 0.2 MG/24HR patch   Yes No   methocarbamol (ROBAXIN) 750 MG tablet   No No   Sig: Take 1 tablet (750 mg) by mouth 4 times daily as needed for Muscle Spasms.      Facility-Administered Medications: None       Allergies: Compazine, Gabapentin, Haldol [haloperidol], Ondansetron, Reglan [metoclopramide], Ultram [tramadol], Zofran [ondansetron], and Toradol    Review of Systems:   All others negative  Review of Systems   Constitutional: Positive for appetite change, chills, fatigue and fever.   HENT: Negative.    Eyes: Negative.    Respiratory: Positive for shortness of breath.    Cardiovascular: Negative.  Negative for chest pain.   Gastrointestinal: Positive for abdominal pain, diarrhea and vomiting.   Genitourinary: Negative.    Musculoskeletal: Negative.    Skin: Negative.    Neurological: Negative.      All other systems reviewed and negative unless otherwise noted in the HPI or above. This was done per my custom and practice for systems appropriate to the chief complaint in an emergency department setting and varies depending on the quality of history that the patient is able to provide.      Physical Exam:   07/11/19  1645   BP: 120/84   Pulse: 56   Resp: 18   Temp: 98.9 F (37.2 C)   SpO2: 96%     Nursing note and vitals reviewed.          Physical Exam  Constitutional:       Appearance: She is well-developed.   HENT:      Head: Normocephalic and atraumatic.   Eyes:      Extraocular Movements: Extraocular movements intact.      Pupils: Pupils are equal, round, and reactive to light.   Cardiovascular:      Rate and Rhythm: Normal rate and regular rhythm.      Heart sounds: Normal heart sounds.   Pulmonary:      Effort: Pulmonary effort is normal.      Breath sounds: Normal breath sounds.   Abdominal:      General: Abdomen is flat. A surgical scar is present. Bowel sounds are normal. There is no distension.      Palpations: Abdomen is soft.      Tenderness: There is generalized abdominal tenderness.   Skin:     General: Skin is warm and dry.      Findings: No rash.   Neurological:      Mental Status: She is alert.         Workup Review:         Impression & Initial ED Plan:  63 year old  female presents with 3 weeks of generalized abdominal pain. Despite being seen in the ED in the past 3 weeks for the same complaint it appears her symptoms have worsened to the point where she cannot PO and has significant discomfort at rest. Also despite reassuring labs, she has signifcant tenderness to palpation of the abdomen. For these reasons we will get CT abdomen. Due to difficult vascular access we had delays and had to resort to placing central line in order to give IV contrast. At this point patient is signed out to Dr. Rushie Chestnut. Stable throughout visit.       The rest of the ED course, results, and plan for the patient is in a separate continuation note. Please see that note for details.  I have discussed my evaluation and care plan for the patient with the attending physician Dr. Dayton ScrapeMurray.     Barbarann EhlersGupta, Leighton Brickley Ashish, MD  Resident  07/12/19 [phone]       Dyann KiefMurray, Matthew P, MD  07/13/19 [phone]

## 2019-07-12 ENCOUNTER — Emergency Department (HOSPITAL_BASED_OUTPATIENT_CLINIC_OR_DEPARTMENT_OTHER): Payer: Medicare Other

## 2019-07-12 ENCOUNTER — Other Ambulatory Visit: Payer: Self-pay

## 2019-07-12 DIAGNOSIS — R109 Unspecified abdominal pain: Secondary | ICD-10-CM

## 2019-07-12 DIAGNOSIS — I10 Essential (primary) hypertension: Secondary | ICD-10-CM

## 2019-07-12 DIAGNOSIS — R160 Hepatomegaly, not elsewhere classified: Secondary | ICD-10-CM

## 2019-07-12 DIAGNOSIS — Z9884 Bariatric surgery status: Secondary | ICD-10-CM

## 2019-07-12 DIAGNOSIS — Z452 Encounter for adjustment and management of vascular access device: Secondary | ICD-10-CM

## 2019-07-12 DIAGNOSIS — G894 Chronic pain syndrome: Secondary | ICD-10-CM

## 2019-07-12 LAB — COVID-19 CORONAVIRUS DETECTION ASSAY AT ~~LOC~~ LAB: COVID-19 Coronavirus Result: NOT DETECTED

## 2019-07-12 MED ORDER — PREGABALIN 100 MG OR CAPS
200.0000 mg | ORAL_CAPSULE | Freq: Two times a day (BID) | ORAL | Status: DC
Start: 2019-07-12 — End: 2019-07-14
  Administered 2019-07-12 – 2019-07-14 (×5): 200 mg via ORAL
  Filled 2019-07-12 (×5): qty 2

## 2019-07-12 MED ORDER — LIDOCAINE VISCOUS 2 % MT SOLN
10.0000 mL | Freq: Once | OROMUCOSAL | Status: AC
Start: 2019-07-12 — End: 2019-07-12
  Administered 2019-07-12 (×2): 10 mL via ORAL
  Filled 2019-07-12: qty 15

## 2019-07-12 MED ORDER — SODIUM CHLORIDE 0.9 % IJ SOLN (CUSTOM)
3.0000 mL | Freq: Three times a day (TID) | INTRAMUSCULAR | Status: DC
Start: 2019-07-12 — End: 2019-07-14
  Administered 2019-07-12 – 2019-07-14 (×3): 3 mL via INTRAVENOUS

## 2019-07-12 MED ORDER — SODIUM CHLORIDE 0.9 % IV SOLN
INTRAVENOUS | Status: DC
Start: 2019-07-12 — End: 2019-07-13
  Administered 2019-07-12 – 2019-07-13 (×3): via INTRAVENOUS

## 2019-07-12 MED ORDER — ACETAMINOPHEN 650 MG RE SUPP
650.0000 mg | RECTAL | Status: DC | PRN
Start: 2019-07-12 — End: 2019-07-14

## 2019-07-12 MED ORDER — ALUM & MAG HYDROXIDE-SIMETH 200-200-20 MG/5ML OR SUSP
30.0000 mL | Freq: Four times a day (QID) | ORAL | Status: DC | PRN
Start: 2019-07-12 — End: 2019-07-14
  Administered 2019-07-12 (×2): 30 mL via ORAL
  Filled 2019-07-12: qty 30

## 2019-07-12 MED ORDER — ALUM & MAG HYDROXIDE-SIMETH 200-200-20 MG/5ML OR SUSP
30.0000 mL | Freq: Once | ORAL | Status: AC
Start: 2019-07-12 — End: 2019-07-12
  Administered 2019-07-12: 30 mL via ORAL
  Filled 2019-07-12: qty 30

## 2019-07-12 MED ORDER — LANSOPRAZOLE 30 MG OR CPDR
30.0000 mg | DELAYED_RELEASE_CAPSULE | Freq: Every day | ORAL | Status: DC
Start: 2019-07-12 — End: 2019-07-14
  Administered 2019-07-13 – 2019-07-14 (×2): 30 mg via ORAL
  Filled 2019-07-12 (×2): qty 1

## 2019-07-12 MED ORDER — SODIUM CHLORIDE 0.9 % IV SOLN
INTRAVENOUS | Status: DC
Start: 2019-07-12 — End: 2019-07-12
  Administered 2019-07-12: 12:00:00 via INTRAVENOUS

## 2019-07-12 MED ORDER — SODIUM CHLORIDE 0.9% TKO INFUSION
INTRAVENOUS | Status: DC | PRN
Start: 2019-07-12 — End: 2019-07-14

## 2019-07-12 MED ORDER — ACETAMINOPHEN 325 MG PO TABS
650.0000 mg | ORAL_TABLET | ORAL | Status: DC | PRN
Start: 2019-07-12 — End: 2019-07-14

## 2019-07-12 MED ORDER — NALOXONE HCL 0.4 MG/ML IJ SOLN
0.1000 mg | INTRAMUSCULAR | Status: DC | PRN
Start: 2019-07-12 — End: 2019-07-14

## 2019-07-12 MED ORDER — ACETAMINOPHEN 160 MG/5ML OR SOLN
650.0000 mg | ORAL | Status: DC | PRN
Start: 2019-07-12 — End: 2019-07-14

## 2019-07-12 MED ORDER — SODIUM CHLORIDE 0.9 % IJ SOLN (CUSTOM)
3.0000 mL | INTRAMUSCULAR | Status: DC | PRN
Start: 2019-07-12 — End: 2019-07-14
  Administered 2019-07-13 (×2): 3 mL via INTRAVENOUS

## 2019-07-12 MED ORDER — ALUM & MAG HYDROXIDE-SIMETH 200-200-20 MG/5ML OR SUSP
30.0000 mL | Freq: Four times a day (QID) | ORAL | Status: DC | PRN
Start: 2019-07-12 — End: 2019-07-12

## 2019-07-12 MED ORDER — SODIUM CHLORIDE 0.9 % IV SOLN
12.5000 mg | Freq: Four times a day (QID) | INTRAVENOUS | Status: DC | PRN
Start: 2019-07-12 — End: 2019-07-14
  Administered 2019-07-12 – 2019-07-14 (×4): 12.5 mg via INTRAVENOUS
  Filled 2019-07-12 (×4): qty 0.5

## 2019-07-12 MED ORDER — MORPHINE SULFATE 2 MG/ML IJ SOLN
2.0000 mg | Freq: Once | INTRAMUSCULAR | Status: AC
Start: 2019-07-12 — End: 2019-07-12
  Administered 2019-07-12: 2 mg via INTRAVENOUS
  Filled 2019-07-12: qty 1

## 2019-07-12 MED ORDER — ATORVASTATIN CALCIUM 40 MG OR TABS
40.0000 mg | ORAL_TABLET | Freq: Every evening | ORAL | Status: DC
Start: 2019-07-12 — End: 2019-07-14
  Administered 2019-07-12 – 2019-07-13 (×2): 40 mg via ORAL
  Filled 2019-07-12 (×2): qty 1

## 2019-07-12 MED ORDER — METHOCARBAMOL 750 MG OR TABS
750.0000 mg | ORAL_TABLET | Freq: Four times a day (QID) | ORAL | Status: DC | PRN
Start: 2019-07-12 — End: 2019-07-14
  Administered 2019-07-12 – 2019-07-14 (×6): 750 mg via ORAL
  Filled 2019-07-12 (×6): qty 1

## 2019-07-12 MED ORDER — ENOXAPARIN SODIUM 40 MG/0.4ML SC SOLN
40.0000 mg | Freq: Every day | SUBCUTANEOUS | Status: DC
Start: 2019-07-12 — End: 2019-07-12
  Administered 2019-07-12 (×2): 40 mg via SUBCUTANEOUS
  Filled 2019-07-12: qty 1

## 2019-07-12 MED ORDER — MORPHINE SULFATE 2 MG/ML IJ SOLN
2.0000 mg | Freq: Once | INTRAMUSCULAR | Status: AC
Start: 2019-07-12 — End: 2019-07-12
  Administered 2019-07-12: 07:00:00 2 mg via INTRAVENOUS
  Filled 2019-07-12: qty 1

## 2019-07-12 MED ORDER — MORPHINE SULFATE 4 MG/ML IJ SOLN
4.0000 mg | Freq: Once | INTRAMUSCULAR | Status: AC
Start: 2019-07-12 — End: 2019-07-12
  Administered 2019-07-12: 4 mg via INTRAVENOUS
  Filled 2019-07-12: qty 1

## 2019-07-12 MED ORDER — ACETAMINOPHEN-CODEINE #3 300-30 MG OR TABS (CUSTOM)
1.0000 | ORAL_TABLET | ORAL | Status: DC | PRN
Start: 2019-07-12 — End: 2019-07-14
  Administered 2019-07-12 – 2019-07-14 (×8): 1 via ORAL
  Filled 2019-07-12 (×8): qty 1

## 2019-07-12 MED ORDER — IOHEXOL 350 MG/ML IV SOLN
100.0000 mL | Freq: Once | INTRAVENOUS | Status: AC
Start: 2019-07-12 — End: 2019-07-12
  Administered 2019-07-12 (×2): 100 mL via INTRAVENOUS
  Filled 2019-07-12: qty 100

## 2019-07-12 MED ORDER — POLYETHYLENE GLYCOL 3350 OR PACK
17.0000 g | PACK | Freq: Every day | ORAL | Status: DC
Start: 2019-07-13 — End: 2019-07-13

## 2019-07-12 NOTE — Progress Notes (Signed)
NO MYCHART PLEASE CALL WITH RESULTS

## 2019-07-12 NOTE — Interdisciplinary (Signed)
07/12/19 1036   Initial Assessment   CM Initial Assessment * Completed   Patient Information   Where was the patient admitted from? * Home   Prior to Level of Function * Use Assisted Devices   Assistive Device * Barrister's clerk)   Prior [employer] None   Dentist) * Self   Primary Contact Name, Number and Relationship Beckie Busing - pt's daughter - [phone]   Permission to [place] Retired/SS/Pension   Financial Resources Medicare;Medi-Cal  (A & B with [employer] manager)   Discharge Planning   Living Arrangements * Homeless   Financial Couselor Referral Not Applicable   Available Assistance/Support System * Children;Shelter   Type of [place] * No   Additional Services Not Applicable   Anticipated Discharge Dispostion/Needs Shelter   Patient's Discharge Goal(s) Other (Comment)  ([facility])   Barriers to Discharge * Awaiting clinical improvement   Do you have difficulty affording your medications No   Patient/Family/Other Engaged in Discharge Planning * Yes   Patient Has Decision Making Capacity * Yes   Patient/Family/Legal/Surrogate Decision Maker Has Been Given a List Options And Choice In The Selection of Post-Acute Care Providers * Not Applicable   Family/Caregiver's Assessed for * Not Applicable  (pt endorses a lack of family support)   Respite Care * Not Applicable   Patient/Family/Other Are In Agreement With Discharge Plan * To be determined   Public Health Clearance Needed * Not Applicable   Social Worker Consult   Do you need to see a Education officer, museum? * No   Readmission Risk Assessment   Readmission Within 30 Days of Discharge * No   Recent Hospitalizations (Within Last 6 Months) * No   High Risk For Readmission * No       Synopsis: 30F hx of diabetes, gastric bypass with prior SBO, COPD  Here with 3wks abd pain, has been here multiple times in the past with neg workup  Here with  diarrhea and n/v  Labs wnl    Pt presents with a flat - disinterested affect - reports [facility]     Pt states her PCP and pharm are both " [facility] "     Pt declined to answer how much her monthly income is     Unit based SW to coord [facility]'s [facility].     Junie Spencer MSN, RN   ED Case Manager

## 2019-07-12 NOTE — ED Notes (Signed)
Admitting MD at bedside for patient evaluation.

## 2019-07-12 NOTE — Plan of Care (Addendum)
Problem: Promotion of Health and Safety  Goal: Promotion of Health and Safety  Description: The patient remains safe, receives appropriate treatment and achieves optimal outcomes (physically, psychosocially, and spiritually) within the limitations of the disease process by discharge.    Information below is the current care plan.  Outcome: Progressing  Flowsheets (Taken 07/12/2019 1417)  Patient /Family stated Goal: less pain  Guidelines: Inpatient Nursing Guidelines  Individualized Interventions/Recommendations #1: pt is on clear liquid diet  Individualized Interventions/Recommendations #2 (if applicable): pain mgmt  Individualized Interventions/Recommendations #3 (if applicable):  Marland Kitchen Pt educated/informed re  . safety precautions  Individualized Interventions/Recommendations #4 (if applicable): Pt being able to ambulate w/ walker  Pt is bradycardiac, see note Md aware

## 2019-07-12 NOTE — ED Notes (Signed)
Lyndel Safe MD aware of need for [place] PIV, plan to engage resident MD for assist

## 2019-07-12 NOTE — ED Notes (Addendum)
Lucious Groves MD & Lyndel Safe MD bedside for central line placement d/t IV access difficulty.  Morphine drawn and held until procedure end.

## 2019-07-12 NOTE — H&P (Addendum)
HISTORY AND PHYSICAL    Admission Date:  07/11/2019    Attending MD:   Patria Mane, MD    Primary Care Provider:  [facility]    Chief Complaint:  "My stomach hurts."    History of Present Illness:     Alexandria Proctor is a 63 year old female with a PMH of multiple abdominal surgeries including gastric bypass, hysterectomy, COPD, HTN, history of amphetamine use, who presented with a three-week history of intermittent abdominal pain which she has been evaluated for in the ED three times prior who presents with one week of constant and progressive abdominal pain associated with nausea, vomiting, and diarrhea.    Patient has had abdominal pain for the past three weeks, which started as intermittent. For the past week it has been constant and worsening. Patient has vomited 5-6 times per day for the past week; emesis has been watery, not green or bloody. Currently reports 9.5/10 pain localized to right central abdomen. She reports that pain is sharp and does not radiate.The pain is exacerbated by PO intake and movement. The pain is associated with decreased PO intake for the past month; getting worse in the past few days. Her last bowel movement was yesterday, loose/watery, no blood and black stool. She reports one week of diarrhea, 2-4 times per day. Patient passing gas. She says she has never had symptoms this bad before. No sick contacts.      Patient reports she had a gastric bypass approximately 10 years ago. Denies history of gastric ulcers. Reports she has a history of small bowel obstruction in 2010. Reports she got a colonoscopy 7 years ago due to bleeding, was unremarkable. Prior CT abdomen/pelvis has confirmed past cholecystectomy, hysterectomy, Roux-en-Y gastric bypass.     She reports chills, nausea, vomiting, diarrhea, shortness of breath at night, weight loss over the past month, change in sense of smell/taste for several days, mild cough, and decreased appetite. She  denies dysuria, chest pain, rash, sore throat, headache, palpitations, syncope.     She reports anaphylaxis with ondansetron, metaclopramide, and compazine; reports she has tolerated phenergan in the past.      Pain Assessment:  The patient endorses pain as 9.5 out of 10, located central abdomen, and described as sharp.    Past Medical and Surgical History:  Past Medical History:   Diagnosis Date   . Amphetamine abuse (CMS-HCC)    . Aortic aneurysm (CMS-HCC)    . Bipolar 1 disorder (CMS-HCC)    . Bipolar affective (CMS-HCC)    . COPD (chronic obstructive pulmonary disease) (CMS-HCC)    . Diabetes mellitus (CMS-HCC)    . Fracture, intertrochanteric, left femur (CMS-HCC)    . Gastric bypass status for obesity    . H/O: hysterectomy    . HLD (hyperlipidemia)    . HTN (hypertension)    . Manic depression (CMS-HCC)    . Obesity    . Opioid abuse (CMS-HCC)    . Polyarthropathy or polyarthritis of multiple sites    . Schizophrenia (CMS-HCC)      Past Surgical History:   Procedure Laterality Date   . GASTRIC BYPASS     . GASTRIC BYPASS     . hx gastric bypass     . s/p IMN left IT fracture 07/20/16     . STOMACH SURGERY     . Ventral Hernia repair         Immunization History:  There is no  immunization history for the selected administration types on file for this patient.    Allergies:  Allergies   Allergen Reactions   . Compazine Anaphylaxis     Per patient tolerates promethazine/Phenergan   . Gabapentin Hives   . Haldol [Haloperidol] Swelling   . Ondansetron Swelling   . Reglan [Metoclopramide] Anaphylaxis   . Ultram [Tramadol] Swelling   . Zofran [Ondansetron] Anaphylaxis   . Toradol Other     "my throat closes up and tongue swells up"       Medications:  Prior to Admission Medications:  No current facility-administered medications for this encounter.     Current Outpatient Medications:   .  atorvastatin (LIPITOR) 40 MG tablet, Take 1 tablet (40 mg) by mouth every evening., Disp: 14 tablet, Rfl: 1  .  cloNIDine  (CATAPRES) 0.2 MG/24HR patch, , Disp: , Rfl:   .  LYRICA 200 MG capsule, Take 1 capsule (200 mg) by mouth 2 times daily., Disp: 180 capsule, Rfl: 1  .  methocarbamol (ROBAXIN) 750 MG tablet, Take 1 tablet (750 mg) by mouth 4 times daily as needed for Muscle Spasms., Disp: 120 tablet, Rfl: 0    Social History:  Tobacco: smoked 1ppd more than 10 years, none currently.   No alcohol use recently.   No other IVDU or recreational drug use recently.   [facility]     Family History:  Family History   Problem Relation Name Age of Onset   . Schizophrenia Mother     . Hypertension Mother       Review of Systems:  Patient reports chills, nausea, vomiting, diarrhea, shortness of breath, weight loss, change in sense of smell/taste, cough, decreased appetite  Patient denies dysuria, chest pain, rash, sore throat, headache, palpitations, syncope    Physical Exam:  Temperature:  [97.8 F (36.6 C)-98.9 F (37.2 C)] 97.8 F (36.6 C) (10/20 16100633)  Blood pressure (BP): (117-126)/(65-84) 126/74 (10/20 0633)  Heart Rate:  [48-56] 48 (10/20 0633)  Respirations:  [18-20] 18 (10/20 96040633)  Pain Score: Patient Sleeping, Respiratory Assessment Done (10/20 0715)  O2 Device: None (Room air) (10/20 54090633)  SpO2:  [95 %-98 %] 98 % (10/20 81190633)    General: In mild distress, uncomfortable, lying on side, well-developed, obese.   HEENT: EOMI, moist mucous membranes, nonicteric sclera; atraumatic, normocephalic.   Neck: Right IJ catheter in place; supple, no lymphadenopathy  CV: Slow rate, regular rhythm, no murmurs, gallops, rubs. Full and equal pulses bilaterally.   Pulm: Nonlabored, normal respiratory rate, symmetric chest expansion, clear to auscultation bilaterally without wheezes, crackles.   Abd: Soft, non-distended, tender to palpation diffusely, but most tender in RUQ, RLQ, peri-umbilical. No masses or organomegaly. Bowel sounds present, normoactive. No  guarding or rebound tenderness. Midline abdominal scar.    Ext: No clubbing, cyanosis, or edema.   Skin: Dry skin on nape of neck. Otherwise, warm, dry. Scab on L arm.   Neuro: AA&Ox3, cranial nerves grossly intact, normal bulk, tone, strength throughout, no hyperreflexia, sensation intact to light touch, no gross incoordination, gait not observed but patient has a walker in room.     Labs and Other Data:  COVID-19 Coronavirus Detection: Not Detected  COVID-19 Source: Nasal    Sodium: 141  Potassium: 3.9  Chloride: 102  Bicarbonate: 30 (H)  Anion Gap: 9  BUN: 14  Creatinine: 0.99 (H)  GFR: >60  Glucose: 81  Calcium: 9.0  Alkaline Phos: 87  ALT (SGPT): 20  AST (SGOT): 21  Bilirubin, Tot: 0.20  Albumin: 4.0  Total Protein: 6.4  Lipase: 16  Lactate: 1.2  WBC: 5.7  RBC: 4.56  Hgb: 12.0  Hct: 37.7  MCV: 82.7  MCH: 26.3  MCHC: 31.8 (L)  RDW: 17.0 (H)  Plt Count: 257  MPV: 9.4  Segs: 62  Lymphocytes: 27  Monocytes: 7  Eosinophils: 4  Basophils: 0  ANC-Automated: 3.5  Abs Lymphs: 1.5  Abs Monos: 0.4  Abs Eosinophils: 0.2  Abs Basophils: 0.0  Diff Type: Automated    Type: Clean catch  Color: Light-Yellow  Appearance: Clear  Specific Gravity: 1.024  pH: 6.5  Protein: Negative  Glucose: Negative  Ketones: Negative  Leuk Esterase: 25 (A)  Nitrite: Negative  Bilirubin: Negative  Blood: Negative  Urobilinogen: Negative  WBC: 0-2  RBC: 0-2  Bacteria: None  Squam. Epithelial Cell: 0-5(RARE)    X-ray Chest Single View    Result Date: 07/11/2019  IMPRESSION: No radiographic evidence of acute cardiothoracic disease.     Ct Abdomen And Pelvis With Contrast    Result Date: 07/12/2019  IMPRESSION: CT scan of the abdomen and pelvis with IV contrast. 1. No acute findings in the abdomen and pelvis. No evidence of small bowel obstruction. 2. Redemonstrated Roux-en-Y gastric bypass surgical changes with inflammation along the native stomach demonstrates, which shows moderate wall thickening and mural stratification with internal liquid  contents (series 4, images 27-30), unchanged from prior. 3. Mild hepatomegaly. 4. Additional stable findings, as above.     Ct Abdomen And Pelvis With Contrast    Result Date: 06/20/2019  IMPRESSION: CT scan of the abdomen and pelvis with IV contrast. 1. No acute findings of the abdomen and pelvis. No obstruction. 2. Additional stable findings as described above.       Assessment and Care Plan:  Alexandria Proctor is a 63 year old female with a PMH of multiple abdominal surgeries including gastric bypass, hysterectomy, COPD, HTN, history of amphetamine use, who presented with a three-week history of intermittent abdominal pain which she has been evaluated for in the ED three times prior who presents with one week of constant and progressive abdominal pain associated with nausea, vomiting, and diarrhea.    #Abdominal pain  Patient with significant tenderness to palpation of her abdomen associated with reported nausea/vomiting/diarrhea, constant and worsening over the past week, exacerbated by eating. Unremarkable workups at prior ED visits, reassuring labs so far, but failed PO challenge. No acute abnormalities on CTAP, lipase and lactate wnl, LFTs wnl, no leukocytosis, UA unremarkable, no palpable masses or organomegaly. Possible gastritis.   - GI consult to evaluate for EGD  - IVF NS 25ml/hr  - Clear liquid diet  - GI pathogen panel  - Phenergan PRN nausea/vomiting - patient reports she has tolerated this medication in the past without reaction  - GI cocktail  - Acetaminophen PRN pain    #Chronic pain  - Lyrica  BID    #HTN  - Hold clonidine patch for now      DVT ppx: Lovenox  Diet: Clear liquids  Code: Full    This plan and alternatives have been discussed with the patient.    Code Status:  No orders of the defined types were placed in this encounter.  Had discussion with patient, patient requested full code, intubation/compressions if necessary.   The patient's primary care physician or clinic has not been  contacted regarding  this admission.    Nigel Berthold, MD  Psychiatry  PGY-1    ATTENDING HISTORY AND PHYSICAL ATTESTATION    Subjective    Chief complaint:  My stomach hurts.     History of present illness:  This is a 63 yo F with Chronic Pain Syndrome, DM, Polysubstance Abuse, s/p Roux-en-Y Gastric bypass surgery (2003), small bowel obstruction, Multiple ventral hernia repairs who presents with worsening abdominal pain.     See resident history and physical for further details of the patient's history.    Objective    I have examined the patient and concur with the resident exam.    Assessment and Plan    I agree with the resident care plan.  -Tylenol#3 prn  -Pregabalin 200 mg po bid   -Lansoprazole 30 mg po daily   -Maalox prn   -Phenergan 12.5 mg IV q6h prn   -NPO midnight  -EGD 07/13/19  -Appreciate GI assistance   -Polysubstance abuse  -SW evaluation   See the resident and physical for further details.

## 2019-07-12 NOTE — ED Floor Report (Signed)
ED to IP Handoff    Report created by Tenny Craw, RN at 6:01 AM 07/12/2019.     HANDOFF REPORT UPDATE/CHANGES (changes in patient status/care/events prior to transfer)  By who:  Time:   Additional information:                                                                                                                                                     Alexandria Proctor is a 63 year old female.    Brief Summary of ED Visit (to include focused assessment and neuro status):  Patient comes to ED with 3 weeks of abdominal pain with several ED visits previously, pain worse in last few days. CT performed.    RN shift assessment exceptions to WDL: Abdomen tender to light touch. Sinus bradycardia.    Any significant events and interventions with responses:  Difficult IV access was resolved with central line placement.     Radiologic studies not completed: na  (None unless otherwise noted)    Chief Complaint   Patient presents with   . Abdominal Pain     [facility]  abd pain x 3 weeks.  n/v/d/body aches x 2 days.  states loss of taste and smell x 2 days.  fsg 84.         Admitted for: Intractable abdominal pain    Code Status:  Please refer to In-pt admitting doctors orders     Level of Care: MS     Sepsis Section:    Is patient septic? no If yes, complete below:    BC x 2 drawn? no  If No explain:  na    Repeat lactate needed? no  If Yes, when is it due?  na    Have TWO Blood Pressures (with MAPs) been documented AFTER crystalloids completed?  na     All initial antibiotics given?  na  If No, explain:  na    Amount of IV fluids received 1000 mL  _________________________________________________________________    Is patient on Heparin? no If yes, complete below:     Time Heparin bolus was given: na    Additional drips patient is on: na    Cardiac rhythm: na    Oxygen Delivery: None    Past Medical History:   Diagnosis Date   . Amphetamine abuse (CMS-HCC)    . Aortic aneurysm (CMS-HCC)    .  Bipolar 1 disorder (CMS-HCC)    . Bipolar affective (CMS-HCC)    . COPD (chronic obstructive pulmonary disease) (CMS-HCC)    . Diabetes mellitus (CMS-HCC)    . Fracture, intertrochanteric, left femur (CMS-HCC)    . Gastric bypass status for obesity    . H/O: hysterectomy    . HLD (hyperlipidemia)    . HTN (hypertension)    . Manic  depression (CMS-HCC)    . Obesity    . Opioid abuse (CMS-HCC)    . Polyarthropathy or polyarthritis of multiple sites    . Schizophrenia (CMS-HCC)        Past Surgical History:   Procedure Laterality Date   . GASTRIC BYPASS     . GASTRIC BYPASS     . hx gastric bypass     . s/p IMN left IT fracture 07/20/16     . STOMACH SURGERY     . Ventral Hernia repair         Allergies: Compazine, Gabapentin, Haldol [haloperidol], Ondansetron, Reglan [metoclopramide], Ultram [tramadol], Zofran [ondansetron], and Toradol    ED Fall Risk: (!) Yes    Skin issues:  no    >> If yes, note areas of skin breakdown. See appropriate photos.      Ambulatory:  Yes with personal walker    Sitter needed: no    CSSRS Suicide Risk Level Screening in Triage: Minimal Risk    Does this patient have a history of aggressive behavior and/or is this patient a green banner patient? no    Isolation Required: yes     >> If yes , what type of isolation: covid r/o    Is patient in custody?  no    Is patient in restraints? no    Swallow Screen:      Swallow Screen Result:      Was the patient made NPO & a Speech Language Pathology Consult Ordered? no    Vitals:    07/11/19 1645 07/12/19 0336   BP: 120/84 117/65   BP Location:  Left arm   BP Patient Position:  Lying right side   Pulse: 56 55   Resp: 18 20   Temp: 98.9 F (37.2 C)    SpO2: 96% 95%   Weight: 101.2 kg (223 lb 1.7 oz)    Height: 5\' 6"  (1.676 m)        Tubes Collected: Blue, Lavender, Red, Yellow SST, Green PST, Green PST on Ice (07/11/19 1915 : 07/13/19, RN)  Urine CX (time acquired): 1915 (07/11/19 1915 : 07/13/19, RN)    Lab Results   Component  Value Date    WBC 5.7 07/11/2019    RBC 4.56 07/11/2019    HGB 12.0 07/11/2019    HCT 37.7 07/11/2019    MCV 82.7 07/11/2019    MCHC 31.8 (L) 07/11/2019    RDW 17.0 (H) 07/11/2019    PLT 257 07/11/2019    MPV 9.4 07/11/2019       Lab Results   Component Value Date    NA 141 07/11/2019    K 3.9 07/11/2019    CL 102 07/11/2019    BICARB 30 (H) 07/11/2019    BUN 14 07/11/2019    CREAT 0.99 (H) 07/11/2019    GLU 81 07/11/2019    [place] 9.0 07/11/2019       Lab Results   Component Value Date    LACTATE 1.2 07/11/2019       No results found for: CPK, CKMBH, TROPONIN    No results found for: PH, PCO2, O2CONTENT, IVHC3, IVBE, O2SAT, UNPH, UNPCO2, ARTPH, ARTPCO2, ARTO2CNT, IAHC3, IABE, ARTO2SAT, UNAPH, UNAPCO2    No results found for this visit on 07/11/19.      Patient Lines/Drains/Airways Status    Active PICC Line / CVC Line / PIV Line / Drain / Airway / Intraosseous Line / Epidural Line / ART Line /  Line Type / Wound     Name: Placement date: Placement time: Site: Days:    CVC Triple Lumen - 07/12/19 Right Internal jugular  07/12/19   0308   Internal jugular  less than 1                    ED Handoff Report is ready for review.  Admitting RN may reach Emergency Department RN, Talitha GivensGavin Glenn Tiffanee Mcnee, RN, at [phone] with any questions.

## 2019-07-12 NOTE — ED Notes (Addendum)
Patient Care Handoff Report      Alexandria Proctor is a 63 year old female.    Received report from [place], Therapist, sports.  Assumed care of patient at this time.     Pt has h/o   Past Medical History:   Diagnosis Date   . Amphetamine abuse (CMS-HCC)    . Aortic aneurysm (CMS-HCC)    . Bipolar 1 disorder (CMS-HCC)    . Bipolar affective (CMS-HCC)    . COPD (chronic obstructive pulmonary disease) (CMS-HCC)    . Diabetes mellitus (CMS-HCC)    . Fracture, intertrochanteric, left femur (CMS-HCC)    . Gastric bypass status for obesity    . H/O: hysterectomy    . HLD (hyperlipidemia)    . HTN (hypertension)    . Manic depression (CMS-HCC)    . Obesity    . Opioid abuse (CMS-HCC)    . Polyarthropathy or polyarthritis of multiple sites    . Schizophrenia (CMS-HCC)      Pt presents with Abdominal Pain [facility]  abd pain x 3 weeks.  n/v/d/body aches x 2 days.  states loss of taste and smell x 2 days.  fsg 84.  )  .  Pt is asleep and oriented x 4 when awake    Pt is on cardiac monitor: no   If yes, cardiac rhythm:      Plan: admission  Lab draw due: no   Radiology pending: no   Fall risk: yes

## 2019-07-12 NOTE — ED MD Progress Note (Signed)
ED Handoff Note  Receiving signout from Dr. Reece Packer    Synopsis: 12F hx of diabetes, gastric bypass with prior SBO, COPD  Here with 3wks abd pain, has been here multiple times in the past with neg workup  Here with diarrhea and n/v  Labs wnl  Pending CTAP    Emergency Department Course at time of sign-out   Orders:   Orders Placed This Encounter   Procedures   . [employer]   . X-Ray Chest Single View   . CT Abdomen And Pelvis With Contrast   . X-Ray Chest Single View   . CBC w/ Diff Lavender   . Comprehensive Metabolic Panel Green   . Lipase, Blood Green Plasma Separator Tube   . Urinalysis with Culture Reflex, when indicated       Medications   morphine injection 4 mg (has no administration in time range)   aluminum-magnesium-simethicone (MAG-AL PLUS) 200-200-20 MG/5ML suspension 30 mL (30 mL Oral Given 07/11/19 1755)   lidocaine (XYLOCAINE) 2 % viscous solution 10 mL (10 mL Oral Given 07/11/19 1755)   sodium chloride 0.9 % 1,000 mL IV bolus ( IntraVENOUS Paused 07/11/19 2200)   morphine injection 4 mg (4 mg IntraVENOUS Given 07/12/19 0237)   iohexol (OMNIPAQUE 350) 350 MG/ML solution 100 mL (100 mL IntraVENOUS Given 07/12/19 0327)          Results for orders placed or performed during the hospital encounter of 07/11/19   CBC w/ Diff Lavender   Result Value Ref Range    WBC 5.7 4.0 - 10.0 1000/mm3    RBC 4.56 3.90 - 5.20 mill/mm3    Hgb 12.0 11.2 - 15.7 gm/dL    Hct 37.7 34.0 - 45.0 %    MCV 82.7 79.0 - 95.0 um3    MCH 26.3 26.0 - 32.0 pgm    MCHC 31.8 (L) 32.0 - 36.0 g/dL    RDW 17.0 (H) 12.0 - 14.0 %    MPV 9.4 9.4 - 12.4 fL    Plt Count 257 140 - 370 1000/mm3    Segs 62 %    Lymphocytes 27 %    Monocytes 7 %    Eosinophils 4 %    Basophils 0 %    ANC-Automated 3.5 1.6 - 7.0 1000/mm3    Abs Lymphs 1.5 0.8 - 3.1 1000/mm3    Abs Monos 0.4 0.2 - 0.8 1000/mm3    Abs Eosinophils 0.2 0.0 - 0.5 1000/mm3    Abs Basophils 0.0 0.0 1000/mm3    Diff Type Automated    Comprehensive Metabolic Panel Green   Result  Value Ref Range    Glucose 81 70 - 99 mg/dL    BUN 14 8 - 23 mg/dL    Creatinine 0.99 (H) 0.51 - 0.95 mg/dL    GFR >60 mL/min    Sodium 141 136 - 145 mmol/L    Potassium 3.9 3.5 - 5.1 mmol/L    Chloride 102 98 - 107 mmol/L    Bicarbonate 30 (H) 22 - 29 mmol/L    Anion Gap 9 7 - 15 mmol/L    Calcium 9.0 8.5 - 10.6 mg/dL    Total Protein 6.4 6.0 - 8.0 g/dL    Albumin 4.0 3.5 - 5.2 g/dL    Bilirubin, Tot 0.20 <1.2 mg/dL    AST (SGOT) 21 0 - 32 U/L    ALT (SGPT) 20 0 - 33 U/L    Alkaline Phos 87 35 -  140 U/L   Lipase, Blood Green Plasma Separator Tube   Result Value Ref Range    Lipase 16 13 - 60 U/L   Urinalysis with Culture Reflex, when indicated    Specimen: Clean Catch; Urine   Result Value Ref Range    Type Clean catch     Color Light-Yellow Yellow    Appearance Clear Clear    Specific Gravity 1.024 1.002 - 1.030    pH 6.5 5.0 - 8.0    Protein Negative Negative    Glucose Negative Negative    Ketones Negative Negative    Bilirubin Negative Negative    Blood Negative Negative    Urobilinogen Negative Negative    Nitrite Negative Negative    Leuk Esterase 25 (A) Negative Leu/uL    WBC 0-2 0-2/HPF    RBC 0-2 0-2/HPF    Bacteria None None-Rare/HPF    Squam. Epithelial Cell 0-5(RARE) <6-10(FEW)   Lactate, Blood - See Instructions   Result Value Ref Range    Lactate 1.2 0.5 - 2.0 mmol/L       To do/pending items:  CTAP read - neg acute  Notify surgery - spoke with surgery before CT and after CT, notified of plan to admit for pain control, they stated given pt not followed by [facility] at [facility] and unclear when/where surgery done and no acute findings on CTAP, okay with primary medicine admission  PO trial - continued pain, pt grimacing    Dispo plan:   Medicine paged for admission    Shelda Jakes

## 2019-07-12 NOTE — ED Procedure Note (Signed)
Procedure Note  Central Line      Date/Time: 07/12/2019 3:08 AM      Universal Protocol  Consent: Verbal consent obtained. Written consent obtained.  Risks and benefits: risks, benefits and alternatives were discussed  Consent given by: patient  Patient understanding: patient states understanding of the procedure being performed  Patient consent: the patient's understanding of the procedure matches consent given  Relevant documents: relevant documents present and verified  Site marked: the operative site was not marked  Imaging studies: imaging studies not available  Required items: required blood products, implants, devices, and special equipment available  Patient identity confirmed: verbally with patient and arm band      Indications and Staff  Indications: other (comment)Difficult IV access and need for IV contrast   Insertion location/unit: [place]  ED [facility]  Reason for insertion: new indication      Performed by: Charlett Blake, MD  Attending present: Yes      Co-Signer: Gordan Payment, MD      Procedure Details  Anesthesia: local infiltration  Local Anesthetic: lidocaine 1% without epinephrine  Anesthetic total: 5 mL  Patient was not sedated  Skin prep used?: chlorhexidine gluconate and alcohol  Skin prep dry before first puncture: Yes      Patient position: Trendelenburg  Insertion side: right   Insertion site: internal jugular    Catheter type: Central line  Tunneled: Yes   Catheter lumen: triple lumen    Is this line for Dialysis: No  Power rated for IV contrast: Yes  Antimicrobial catheter used: Yes    Ultrasound guidance: yes  Sterile ultrasound technique: sterile gel and sterile probe covers were used.  Indications for ultrasound: safety      Ultrasound guided placement steps: vessel located, needle entry and guide wire removed  Sterile precautions used: sterile gown, cap, mask/eye shield, sterile gloves, head to toe drape on patient and hand hygiene  Sterile field maintained during  procedure: Yes  Sterile technique maintained during procedure and dressing application: Yes  Catheter securement: suture    Post Procedure  Post-procedure: antimicrobial patch applied and dressing applied  Estimated Blood Loss: 0 ml  Assessment: blood return through all ports and placement verified by x-ray  Guidewire removed intact: Yes  Removal was confirmed by independent observer: Gordan Payment, MD  Complications: none    Follow up: X-ray done, line in place, OK to use  Number of puncture attempts: 1  Line placement successful: Yes    Patient tolerance: patient tolerated the procedure well with no immediate complications

## 2019-07-12 NOTE — Interdisciplinary (Signed)
1105: Pt oriented to the unit, call light w/in reach. Text paged Md on call re; FYI Pt's HR= 48 she denied dizziness,palpitations, chest pain at this time.

## 2019-07-12 NOTE — Consults (Signed)
Gastroenterology/Hepatology Consultation  Fellow: Arma Heading  Requesting Physician: Gennaro Africa, MD    Reason For Consultation:   Abdominal pain    Chief Complaint:   Abdominal pain    History of Present Illness:   Patient is a 63 year old female with history of Roux-en-Y gastric bypass in 0623 complicated by SBO, hysterectomy, COPD, hypertension, history of methamphetamine use, who presents with 3 week history of abdominal pain.  This is her 4th ED visit for abdominal pain in the last 2 weeks.    Patient reports that her abdominal pain started about 3 weeks ago.  Initially it was localized around her RUQ and right side, sharp stabbing pain that would come and go, but over time it has progressed and is now constant.  She reports that nothing makes it better, but that she thinks food makes it worse.  She she reports that when she eats, she feels like her insides are "being twisted." She reports constant nausea, but no vomiting.  She has been having daily bowel movements, last yesterday normal brown color but a little bit loose.  She is noted to have multiple ED visits in the past for abdominal pain several last year, but she reports that this pain is different from those episodes.  She denies significant NSAID use or new medications.  Previously smoked tobacco, but quit a few months ago.  Denies recent alcohol or drug use. Prior EGD/colonoscopy done in 2013 for abdominal pain were unremarkable.    Current Problem List:   Patient Active Problem List    Diagnosis Date Noted   . Abdominal pain 07/12/2019   . Aortic aneurysm (CMS-HCC)    . Psychosis (CMS-HCC) 04/20/2018   . Suicidal ideation 04/15/2018   . Major depressive disorder 04/15/2018   . Fibromyalgia 04/15/2018   . GERD (gastroesophageal reflux disease) 04/15/2018   . Vitamin D deficiency 04/15/2018   . Chronic abdominal pain 04/15/2018   . Bipolar affective (CMS-HCC)    . Amphetamine abuse (CMS-HCC)    . Opioid abuse (CMS-HCC)    . Fracture,  intertrochanteric, left femur (CMS-HCC)    . HTN (hypertension)    . HLD (hyperlipidemia)    . Obesity    . Closed displaced fracture of left femoral neck (CMS-HCC) 07/19/2016   . Benign neoplasm of colon removed during colonoscopy 06/16/2012   . Abnormal liver function test 06/15/2012   . Abdominal pain, acute suspected to be due to gastroenteritis 06/14/2012   . Abdominal Lipoma 06/14/2012   . Suicide ideation cleared by Psychiatry for Discharge 06/14/2012       Past Medical History:  Past Medical History:   Diagnosis Date   . Amphetamine abuse (CMS-HCC)    . Aortic aneurysm (CMS-HCC)    . Bipolar 1 disorder (CMS-HCC)    . Bipolar affective (CMS-HCC)    . COPD (chronic obstructive pulmonary disease) (CMS-HCC)    . Diabetes mellitus (CMS-HCC)    . Fracture, intertrochanteric, left femur (CMS-HCC)    . Gastric bypass status for obesity    . H/O: hysterectomy    . HLD (hyperlipidemia)    . HTN (hypertension)    . Manic depression (CMS-HCC)    . Obesity    . Opioid abuse (CMS-HCC)    . Polyarthropathy or polyarthritis of multiple sites    . Schizophrenia (CMS-HCC)        Past Surgical History:  Past Surgical History:   Procedure Laterality Date   . GASTRIC BYPASS     .  GASTRIC BYPASS     . hx gastric bypass     . s/p IMN left IT fracture 07/20/16     . STOMACH SURGERY     . Ventral Hernia repair         Social History:  Social History     Socioeconomic History   . Marital status: Single     Spouse name: Not on file   . Number of children: Not on file   . Years of education: Not on file   . Highest education level: Not on file   Occupational History   . Occupation: unemployed   Tobacco Use   . Smoking status: Current Every Day Smoker     Packs/day: 1.00     Types: Cigarettes   . Smokeless tobacco: Never Used   Substance and Sexual Activity   . Alcohol use: Yes     Alcohol/week: 0.0 standard drinks   . Drug use: Yes     Types: Methamphetamines, Marijuana   . Sexual activity: Not Currently   Social Activities of Daily  Living Present   . Not on file   Social History Narrative    ** Merged History Encounter **         ** Merged History Encounter **         ** Merged History Encounter **            Family History:  Family history of colorectal cancer or inflammatory bowel disease:  Family History   Problem Relation Name Age of Onset   . Schizophrenia Mother     . Hypertension Mother         Allergy:  Allergies   Allergen Reactions   . Compazine Anaphylaxis     Per patient tolerates promethazine/Phenergan   . Gabapentin Hives   . Haldol [Haloperidol] Swelling   . Ondansetron Swelling   . Reglan [Metoclopramide] Anaphylaxis   . Ultram [Tramadol] Swelling   . Zofran [Ondansetron] Anaphylaxis   . Toradol Other     "my throat closes up and tongue swells up"       Current Medications:  Current Facility-Administered Medications   Medication   . acetaminophen (TYLENOL) tablet 650 mg    Or   . acetaminophen (TYLENOL) solution 650 mg    Or   . acetaminophen (TYLENOL) suppository 650 mg   . acetaminophen-codeine (TYLENOL #3) 300-30 MG tablet 1 tablet   . aluminum-magnesium-simethicone (MAG-AL PLUS) 200-200-20 MG/5ML suspension 30 mL   . atorvastatin (LIPITOR) tablet 40 mg   . lansoprazole (PREVACID) DR capsule 30 mg   . methocarbamol (ROBAXIN) tablet 750 mg   . nalOXone (NARCAN) injection 0.1 mg   . [START ON 07/13/2019] polyethylene glycol (MIRALAX) packet 17 g   . pregabalin (LYRICA) capsule 200 mg   . promethazine (PHENERGAN) 12.5 mg in sodium chloride 0.9 % 50 mL IVPB   . sodium chloride 0.9 % flush 3 mL   . sodium chloride 0.9 % flush 3 mL   . sodium chloride 0.9 % TKO infusion   . sodium chloride 0.9% infusion       Review of Systems:  ROS negative except as above    Physical examination:  Body mass index is 36.01 kg/m.  Wt Readings from Last 2 Encounters:   07/12/19 101.2 kg (223 lb 1.7 oz)   11/23/18 101.2 kg (223 lb)     Temperature:  [97.6 F (36.4 C)-97.8 F (36.6 C)] 97.6 F (36.4 C) (10/20 1903)  Blood pressure (BP):  (107-131)/(59-87) 117/73 (10/20 1903)  Heart Rate:  [46-55] 50 (10/20 1903)  Respirations:  [16-20] 17 (10/20 1903)  Pain Score: 6 (10/20 1845)  O2 Device: None (Room air) (10/20 1527)  SpO2:  [95 %-99 %] 98 % (10/20 1903)    General appearance: female lying in bed in no apparent distress, conversant  Ears, mouth, throat: atraumatic ears/nose, no oral ulcers, oropharynx is clear, oral mucosa moist   Eyes: no scleral icterus, no conjunctival erythema  Lungs: Clear to auscultation, no wheezing, normal respiratory effort   CV: regular rate and rhythm  Abdomen: Soft, extremely tender to palpation over RUQ, unable to fully cooperate with carnett's sign maneuver  Extremities: No peripheral edema, wwp   Psych: Alert and oriented to person, place and time, normal affect     Labs:  I have reviewed the labs listed below.    CBC  Lab Results   Component Value Date    WBC 5.7 07/11/2019    HGB 12.0 07/11/2019    HCT 37.7 07/11/2019    MCV 82.7 07/11/2019    PLT 257 07/11/2019     Chemistry  Lab Results   Component Value Date    NA 141 07/11/2019    K 3.9 07/11/2019    CL 102 07/11/2019    BICARB 30 (H) 07/11/2019    BUN 14 07/11/2019    CREAT 0.99 (H) 07/11/2019    GLU 81 07/11/2019     Liver  Lab Results   Component Value Date    TBILI 0.20 07/11/2019    DBILI 0.1 03/24/2013    AST 21 07/11/2019    ALT 20 07/11/2019    ALK 87 07/11/2019    ALB 4.0 07/11/2019     Coags  Lab Results   Component Value Date    INR 1.0 04/06/2018    PTT 41 (H) 02/05/2018       Images:    X-ray Chest Single View    Result Date: 07/12/2019  IMPRESSION: New right transjugular approach central venous line followed to the lower superior vena cava. No pneumothorax demonstrated. Slightly increased bibasal opacities likely due to atelectasis. No other change.    X-ray Chest Single View    Result Date: 07/11/2019  IMPRESSION: No radiographic evidence of acute cardiothoracic disease.     X-ray Chest Single View    Result Date: 06/20/2019  IMPRESSION: No  evidence of free air.    Ct Abdomen And Pelvis With Contrast    Result Date: 07/12/2019  IMPRESSION: CT scan of the abdomen and pelvis with IV contrast. 1. No acute findings in the abdomen and pelvis. No evidence of small bowel obstruction. 2. Redemonstrated Roux-en-Y gastric bypass surgical changes with inflammation along the native stomach, which shows moderate wall thickening and mural stratification with internal liquid contents (series 4, images 27-30), unchanged from prior. Suspect gastritis. 3. Mild hepatomegaly. 4. Additional stable findings, as above.     Ct Abdomen And Pelvis With Contrast    Result Date: 06/20/2019  IMPRESSION: CT scan of the abdomen and pelvis with IV contrast. 1. No acute findings of the abdomen and pelvis. No obstruction. 2. Additional stable findings as described above.     Endoscopy:    06/15/2012 EGD    Endoscopic Diagnosis: 1. Anatomy consistent with roux-en-  y gastric bypass without significant angulation or ulceration  of gastro-jejunal anastamosis.  2. No endoscopic findings to explain abdominal pain.    Recommendations: 1. Patient should follow  up with her  PMD for further evalaution of abdominal pain.  2. Outpatient GI referral if deemed necessary by primary  care physician.    06/15/2012 COLONOSCOPY    Endoscopic Diagnosis: - Inadequate prep in many parts of  colon  - A flat 66m polyp immediately proximal to proximal lip of IC  valve was observed and cold snare resected. Another 343m polyp was seen that was removed with forceps  - A few hyperplastic appearing polyps observed in recto  sigmoid colon  - Given prep is inadequate even large and flat lesions can  be missed  - Slight nodularity at dentate line on retroflexion, s/p biopsies    Recommendations: Follow up biopsies  Patient should get repeat colonoscopy with 5 days of no  fiber, 3 days of liquid diet and double preparation WITHIN 6  MONTHS GIVEN ADENOMATOUS APPEARING POLYPS  HAVE BEEN REMOVED FROM  INADEQUATELY  PREPPRED COLON    FINAL PATHOLOGIC DIAGNOSIS:  A: Colon polyp, cecum, biopsy       - Tubular adenoma in both fragments.  B: Dentate line, biopsy       - Squamous epithelium with no diagnostic alteration.      Assessment and Plan:  Patient is a [age]ear old female with history of Roux-en-Y gastric bypass in 206712omplicated by SBO, hysterectomy, COPD, hypertension, history of methamphetamine use, who presents with 3 week history of abdominal pain. No significant lab abnormalities, normal LFTs, no anemia. CT A/P from this admission noted to have some gastric wall thickening in the radiology read, but this is noted to be stable from prior CT A/P in 05/2019. No significant weight loss; in fact she has gained about 30 lb in the last year. However, given frequent ED visits for abdominal pain and no recent endoscopic evaluation, would be reasonable to perform EGD for further assessment. Additionally she is noted to be overdue for colonoscopy (tubular adenoma seen in colonoscopy in 2013, but poor prep) but given active nausea and abdominal pain, she is unlikely to tolerate prep. Colonoscopy can be done as outpatient.  - Plan for EGD tomorrow  - NPO past midnight  - try PPI  - The risks, benefits and alternatives of procedure and sedation including perforation, bleeding, infection, and sedation adverse reaction, were explained and accepted by the patient, and the patient agrees to the treatment plan.     Patient seen and discussed with attending, Dr. AnEverlene Farrier   CyCamelia PhenesMD PhD   Gastroenterology/Hepatology Fellow

## 2019-07-13 ENCOUNTER — Encounter (HOSPITAL_COMMUNITY): Admission: EM | Disposition: A | Discharge status: Home Health | Payer: Self-pay | Attending: Emergency Medicine

## 2019-07-13 ENCOUNTER — Encounter (HOSPITAL_BASED_OUTPATIENT_CLINIC_OR_DEPARTMENT_OTHER): Payer: Self-pay

## 2019-07-13 ENCOUNTER — Other Ambulatory Visit: Payer: Self-pay

## 2019-07-13 DIAGNOSIS — Z9884 Bariatric surgery status: Secondary | ICD-10-CM

## 2019-07-13 DIAGNOSIS — E785 Hyperlipidemia, unspecified: Secondary | ICD-10-CM

## 2019-07-13 LAB — CBC WITH DIFF, BLOOD
ANC-Automated: 3.8 10*3/uL (ref 1.6–7.0)
Abs Basophils: 0 10*3/uL
Abs Eosinophils: 0.2 10*3/uL (ref 0.0–0.5)
Abs Lymphs: 1 10*3/uL (ref 0.8–3.1)
Abs Monos: 0.5 10*3/uL (ref 0.2–0.8)
Basophils: 0 %
Eosinophils: 4 %
Hct: 36 % (ref 34.0–45.0)
Hgb: 11.7 gm/dL (ref 11.2–15.7)
Lymphocytes: 18 %
MCH: 26.7 pg (ref 26.0–32.0)
MCHC: 32.5 g/dL (ref 32.0–36.0)
MCV: 82.2 um3 (ref 79.0–95.0)
MPV: 9.1 fL — ABNORMAL LOW (ref 9.4–12.4)
Monocytes: 9 %
Plt Count: 247 10*3/uL (ref 140–370)
RBC: 4.38 10*6/uL (ref 3.90–5.20)
RDW: 16.9 % — ABNORMAL HIGH (ref 12.0–14.0)
Segs: 69 %
WBC: 5.5 10*3/uL (ref 4.0–10.0)

## 2019-07-13 LAB — BASIC METABOLIC PANEL, BLOOD
Anion Gap: 6 mmol/L — ABNORMAL LOW (ref 7–15)
BUN: 8 mg/dL (ref 8–23)
Bicarbonate: 29 mmol/L (ref 22–29)
Calcium: 8.7 mg/dL (ref 8.5–10.6)
Chloride: 109 mmol/L — ABNORMAL HIGH (ref 98–107)
Creatinine: 0.81 mg/dL (ref 0.51–0.95)
GFR: >60 mL/min
Glucose: 81 mg/dL (ref 70–99)
Potassium: 4 mmol/L (ref 3.5–5.1)
Sodium: 144 mmol/L (ref 136–145)

## 2019-07-13 LAB — UR DRUGS OF ABUSE SCREEN
Amphetamines Screen: NEGATIVE
Barbiturates Screen: NEGATIVE
Benzodiazepine Screen: NEGATIVE
Cocaine Screen: NEGATIVE
Methadone Screen: NEGATIVE
Oxycodone Screen: NEGATIVE
Phencyclidine Screen: NEGATIVE
THC Screen: NEGATIVE

## 2019-07-13 LAB — LIVER PANEL, BLOOD
ALT (SGPT): 21 U/L (ref 0–33)
AST (SGOT): 22 U/L (ref 0–32)
Albumin: 3.5 g/dL (ref 3.5–5.2)
Alkaline Phos: 71 U/L (ref 35–140)
Bilirubin, Dir: <0.2 mg/dL (ref <0.2)
Bilirubin, Tot: 0.2 mg/dL (ref <1.2)
Total Protein: 5.6 g/dL — ABNORMAL LOW (ref 6.0–8.0)

## 2019-07-13 LAB — URINE CULTURE

## 2019-07-13 SURGERY — ESOPHAGOGASTRODUODENOSCOPY (EGD)
Anesthesia: Moderate Sedation - by non-anesthesia staff only

## 2019-07-13 MED ORDER — ACETAMINOPHEN 325 MG PO TABS
650.0000 mg | ORAL_TABLET | ORAL | 0 refills | Status: AC | PRN
Start: 2019-07-13 — End: 2019-08-12
  Filled 2019-07-13: qty 60, 5d supply, fill #0

## 2019-07-13 MED ORDER — MIDAZOLAM HCL 5 MG/5ML IJ SOLN
INTRAMUSCULAR | Status: DC | PRN
Start: 2019-07-13 — End: 2019-07-13
  Administered 2019-07-13: 14:00:00 1 mg via INTRAVENOUS
  Administered 2019-07-13 (×2): 2 mg via INTRAVENOUS

## 2019-07-13 MED ORDER — DIPHENHYDRAMINE HCL 50 MG/ML IJ SOLN
INTRAMUSCULAR | Status: AC
Start: 2019-07-13 — End: 2019-07-13
  Filled 2019-07-13: qty 1

## 2019-07-13 MED ORDER — DIPHENHYDRAMINE HCL 50 MG/ML IJ SOLN
INTRAMUSCULAR | Status: DC | PRN
Start: 2019-07-13 — End: 2019-07-13
  Administered 2019-07-13: 14:00:00 25 mg via INTRAVENOUS

## 2019-07-13 MED ORDER — ALUM & MAG HYDROXIDE-SIMETH 200-200-20 MG/5ML OR SUSP
30.0000 mL | Freq: Four times a day (QID) | ORAL | 0 refills | Status: DC | PRN
Start: 2019-07-13 — End: 2020-03-05
  Filled 2019-07-13: qty 355, 3d supply, fill #0

## 2019-07-13 MED ORDER — MEPERIDINE HCL 100 MG/2ML IJ SOLN
INTRAMUSCULAR | Status: DC | PRN
Start: 2019-07-13 — End: 2019-07-13
  Administered 2019-07-13: 25 mg via INTRAVENOUS
  Administered 2019-07-13: 50 mg via INTRAVENOUS
  Administered 2019-07-13: 25 mg via INTRAVENOUS

## 2019-07-13 MED ORDER — MIDAZOLAM HCL 5 MG/5ML IJ SOLN
INTRAMUSCULAR | Status: AC
Start: 2019-07-13 — End: 2019-07-13
  Filled 2019-07-13: qty 10

## 2019-07-13 MED ORDER — INFLUENZA VAC 65+ YRS WRAPPED RECORD (~~LOC~~)
1.0000 | PREFILLED_SYRINGE | INTRAMUSCULAR | Status: DC
Start: 2019-07-13 — End: 2019-07-14

## 2019-07-13 MED ORDER — PNEUMOCOCCAL VAC POLYVALENT 25 MCG/0.5ML IJ INJ (CUSTOM)
0.5000 mL | INJECTION | INTRAMUSCULAR | Status: DC
Start: 2019-07-13 — End: 2019-07-13

## 2019-07-13 MED ORDER — MEPERIDINE HCL 100 MG/2ML IJ SOLN
INTRAMUSCULAR | Status: AC
Start: 2019-07-13 — End: 2019-07-13
  Filled 2019-07-13: qty 100

## 2019-07-13 NOTE — Discharge Summary (Signed)
Date of Admission:  07/11/2019  Date of Discharge:  07/14/2019    Patient Name:  Alexandria Proctor    Principal Diagnosis (required):  Intractable abdominal pain    Hospital Problem List (required):  Active Hospital Problems    Diagnosis   . *Intractable abdominal pain [R10.9]   . Abdominal pain [R10.9]   . HLD (hyperlipidemia) [E78.5]   . HTN (hypertension) [I10]      Resolved Hospital Problems   No resolved problems to display.       Additional Hospital Diagnoses ("rule out" or "suspected" diagnoses, etc.):  Rule out gastritis    Principal Procedure During This Hospitalization (required):  Esophagogastroduodenoscopy    Other Procedures Performed During This Hospitalization (required):  CT abdomen and pelvis  Chest X-Ray  Laboratory monitoring  Central line placement    Procedure results are available in Chart Review in Epic.  For those providers external to [facility], the key procedure results are listed below:  EGD:  FINDINGS  GIFXP scope. Normal esophagus, gastric pouch, and jejunum.    ENDOSCOPIC DIAGNOSIS  Normal exam of the upper GI tract after gastric bypass    X-ray Chest Single View    Result Date: 07/12/2019  IMPRESSION: New right transjugular approach central venous line followed to the lower superior vena cava. No pneumothorax demonstrated. Slightly increased bibasal opacities likely due to atelectasis. No other change.    X-ray Chest Single View    Result Date: 07/11/2019  IMPRESSION: No radiographic evidence of acute cardiothoracic disease.     Ct Abdomen And Pelvis With Contrast    Result Date: 07/12/2019  IMPRESSION: CT scan of the abdomen and pelvis with IV contrast. 1. No acute findings in the abdomen and pelvis. No evidence of small bowel obstruction. 2. Redemonstrated Roux-en-Y gastric bypass surgical changes with inflammation along the native stomach, which shows moderate wall thickening and mural stratification with internal liquid contents (series 4, images 27-30), unchanged from prior. Suspect  gastritis. 3. Mild hepatomegaly. 4. Additional stable findings, as above.     UDS  Amphetamines Screen: Negative  Barbiturates Screen: Negative  Cocaine Screen: Negative  Benzodiazepine Screen: Negative  THC Screen: Negative  Methadone: Negative  Oxycodone Screen: Negative  Opiates Screen: Pending confirm  Phencyclidine Screen: Negative  UR Drug Screen Interpretation: See Comment    CMP  Sodium: 144  Potassium: 4.0  Chloride: 109 (H)  Bicarbonate: 29  Anion Gap: 6 (L)  BUN: 8  Creatinine: 0.81  GFR: >60  Glucose: 81  Calcium: 8.7  Alkaline Phos: 71  ALT (SGPT): 21  AST (SGOT): 22  Bilirubin, Dir: <0.2  Bilirubin, Tot: 0.20  Albumin: 3.5  Total Protein: 5.6 (L)    CBC  WBC: 5.5  RBC: 4.38  Hgb: 11.7  Hct: 36.0  MCV: 82.2  MCH: 26.7  MCHC: 32.5  RDW: 16.9 (H)  Plt Count: 247  MPV: 9.1 (L)  Segs: 69  Lymphocytes: 18  Monocytes: 9  Eosinophils: 4  Basophils: 0  ANC-Automated: 3.8  Abs Lymphs: 1.0  Abs Monos: 0.5  Abs Eosinophils: 0.2  Abs Basophils: 0.0    Lactate 1.2  Lipase 16    Consultations Obtained During This Hospitalization:  Gastroenterology    Key consultant recommendations:  Gastroenterology: Evaluated patient and recommended PPI while in the hospital pending EGD. Performed EGD, determined it was a normal exam of the upper GI tract after gastric bypass. Recommended MRCP if able, but given patient's hip hardware will not be able to perform this  study. No further recommendations, no PPI required given no evidence of gastritis or gastric ulcers.     Reason for Admission to the Hospital / History of Present Illness:  Alexandria Proctor is a 63 year old female with a PMH of multiple abdominal surgeries including gastric bypass, hysterectomy, COPD, HTN, history of amphetamine use, who presented with a three-week history of intermittent abdominal pain for which she had been evaluated for in the ED three times in the past several weeks with unremarkable workups. She reported one week of constant and progressive  abdominal pain associated with nausea, vomiting, and diarrhea.    Patient reported abdominal pain for three weeks prior to admission, which started as intermittent but in the week prior to admission it had been constant and worsening. She reported 5-6 episodes of emesis per day for the week prior to admission, describing watery, nonbilious, nonbloody emesis. She reported severe pain in located centrally in her abdomen, describing it as sharp and nonradiating, worsening, exacerbated by eating and movement.  Her last bowel movement was yesterday, loose/watery, no blood and black stool. She reported one week of diarrhea, 2-4 times per day, passing gas. She reported no sick contacts. She was admitted for further workup of her intractable abdominal pain.     Hospital Course by Problem (required):  #Abdominal pain  Patient presented with significant tenderness to palpation of her abdomen, with recoil to even light stethoscope placement, associated with reported nausea/vomiting/diarrhea, constant and worsening over the past week, exacerbated by eating. Initial labs were unremarkable, including CBC, LFTs, lipase, lactate, electrolytes, and urinalysis. Additionally, CXR showed no free air and CT abdomen and pelvis was unremarkable for acute processes, revealing no hernia or intraabdominal inflammation such as pancreatitis, diverticulitis. Patient was placed on clear liquid diet given her PO intolerance and offered GI cocktail for potential gastritis; she was started on Tylenol #3 for pain. She was evaluated by gastroenterology, who recommended starting PPI and planned to evaluate with EGD given finding of stomach wall thickening on CT. She was started NPO in preparation for the procedure and continued to be monitored for signs of peritonitis, perforation, or other inflammation. EGD was performed 07/13/19 and revealed no gastritis or ulcers. Gastroenterology suggested that MRCP could be done to further evaluate abdominal  pain given CBD and pancreatic duct enlargement (likely post-surgical), but given patient's hip hardware this was not pursued. Throughout her hospital stay, patient had no episodes of emesis and demonstrated no diarrhea. Given negative workup and stable vitals, patient was deemed to be safe for discharge. On discharge, she still reported similar sharp, central abdominal pain, but suspect that there is some component of exaggeration given incongruence of different physical exam maneuvers throughout her hospitalization. She was discharged with acetaminophen 650mg  q6h PRN abdominal pain and Maalox PRN abdominal pain. On discharge, she was vitally stable, noted to have bradycardia consistent with her baseline. Her right IJ central line was removed prior to discharge.     #Chronic pain  Patient's home Lyrica 200mg  BID was continued during this hospitalization, in addition to her Robaxin 750mg  QID being continued on an as-needed basis.     #HTN  Patient's home clonidine patch was not continued during this hospitalization due to reported decreased PO intake and normal BP on presentation.     #Hyperlipidemia  Patient was continued on her home atorvastatin 40mg  daily.     Tests Outstanding at Discharge Requiring Follow Up:  No outstanding tests.     Discharge Condition (required):  Stable.    Key Physical Exam Findings at Discharge:  Mental Status Exam: Patient is alert and oriented to person, place, time, and situation.  Physical examination is significant for:  RRR, Good air entry b/l lungs, abdomen +bs, soft ND, no edema LE b/l.    Discharge Diet:  Regular.    Discharge Medications:     What To Do With Your Medications      START taking these medications      Add'l Info   acetaminophen 325 MG tablet  Commonly known as: TYLENOL  Take 2 tablets (650 mg) by mouth every 4 hours as needed (Abdominal pain).   Quantity: 60 tablet  Refills: 0     aluminum-magnesium-simethicone 200-200-20 MG/5ML suspension  Commonly known as:  MAG-AL PLUS  Take 30 mL by mouth every 6 hours as needed for Heartburn (abdominal pain).   Quantity: 355 mL  Refills: 0        CONTINUE taking these medications      Add'l Info   atorvastatin 40 MG tablet  Commonly known as: LIPITOR  Take 1 tablet (40 mg) by mouth every evening.   Quantity: 14 tablet  Refills: 1     cloNIDine 0.2 MG/24HR patch  Commonly known as: CATAPRES   Refills: 0     Lyrica 200 MG capsule  Take 1 capsule (200 mg) by mouth 2 times daily.  Generic drug: pregabalin   Quantity: 180 capsule  Refills: 1     methocarbamol 750 MG tablet  Commonly known as: ROBAXIN  Take 1 tablet (750 mg) by mouth 4 times daily as needed for Muscle Spasms.   Quantity: 120 tablet  Refills: 0           Where to Get Your Medications      These medications were sent to [facility]  [address], [address] Floor, [place] [id number]    Hours: Mon-Fri: 8:30am-7:00pm; Sat-Sun & Holidays: 9:00am-5:00pm Phone: [phone]    acetaminophen 325 MG tablet   aluminum-magnesium-simethicone 200-200-20 MG/5ML suspension         Allergies:  Allergies   Allergen Reactions   . Compazine Anaphylaxis     Per patient tolerates promethazine/Phenergan   . Gabapentin Hives   . Haldol [Haloperidol] Swelling   . Ondansetron Swelling   . Reglan [Metoclopramide] Anaphylaxis   . Ultram [Tramadol] Swelling   . Zofran [Ondansetron] Anaphylaxis   . Toradol Other     "my throat closes up and tongue swells up"       Discharge Disposition:  Home.    Discharge Code Status:  Full code / full care  This code status is not changed from the time of admission.    Follow Up Appointments:    Already Scheduled:  No future appointments.        Certain appointment types may not appear in this section. Refer to the Post Discharge Referrals section of the After Visit Summary for further information.    Discharging Physician's Contact Information:  [facility] operator at [phone].

## 2019-07-13 NOTE — Plan of Care (Signed)
[facility] / [facility]       Re: Alexandria Proctor Rm 623A.  Pt now on reg diet and tolerating po fluids. Would you like to DC cont IV fluids? Thanks!

## 2019-07-13 NOTE — Plan of Care (Signed)
[facility] / [facility]     Re: Alexandria Proctor Rm 623A. Pt cont to report severe pain to abd. Only Tylenol ordered. Pt asking for stronger pain meds. Please advise. Thanks!

## 2019-07-13 NOTE — Discharge Instructions (Signed)
Diagnosis and Reason for Admission    You were admitted to the hospital for the following reason(s):  You were experiencing abdominal pain.     Your full diagnosis list is located on this After Visit Summary in the Hospital Problems section.    What [facility] Stay    The main tests and treatments done for you during this hospitalization were:    Esophagogastroduodenoscopy  CT abdomen and pelvis  Chest X-ray  Laboratory monitoring    The following evaluation is still important to complete after discharge from the hospital:   None.    Instructions for After Discharge    Your diet at home should be a regular diet.    Your activity level at home should be:  regular activity.    Specific activity restrictions:    None    Wound or tube care instructions:  None    Your medication list is located on this After Visit Summary in the Current Discharge Medication List section.  Your nurse will review this information with you before you leave the hospital.    It is very important for you to keep a current medication list with you in order to assist your doctors with your medical care.  Bring this After Visit Summary with you to your follow up appointments.         Reasons to Contact a Doctor Urgently    Call 911 or return to the hospital immediately if:  You experience bloody vomiting, notice blood in your stool, experience multiple episodes of vomiting or diarrhea for multiple days in a row. You experience sudden chest pain or shortness of breath.     You should contact either your primary care physician or your hospital physician for any of the following reasons: You experience severe abdominal pain, experience continued nausea/vomiting that interferes with your ability to eat/drink.     If you have any questions about your hospital care, your medications, or if you have new or concerning symptoms soon after going home from the hospital, and you need to contact your hospital physician, your hospital  physician can be contacted in the following manner:  [facility] operator at [phone].    Once you are able to see your primary care physician (PCP), your PCP will then be responsible for further medication refills, or appointment referrals.    What Needs to Happen Next After Discharge -- Appointments and Follow Up    Any appointments already scheduled at [facility] will be listed in the Future Appointments section at the top of this After Visit Summary.  Any appointments that have been requested, but have not yet been scheduled, will be listed below that under Post Discharge Referrals.    Sometimes tests performed in the hospital do not yet have results by the time a patient goes home.  The following key tests will need to be followed up at your next appointment: None     Please follow up with your outpatient provider at [facility].     Medical Home Information    Your primary care provider or clinic currently on file at [facility] is: [facility], [place] currently have an advance directive or living will on file at [facility]: Yes    Handouts Given to You (if applicable)

## 2019-07-13 NOTE — Interdisciplinary (Signed)
Handoff report given to RN Molly.

## 2019-07-13 NOTE — Procedures (Signed)
[facility]  Gastroenterology/Special Procedures    Patient Name: Alexandria Proctor  Date of Birth: 1955-10-06  Record Number: [id number]  Date of Procedure: 07/13/2019  Referring Physician:   Endoscopist: Nicola Police   Asst. Endoscopist: Camelia Phenes    Nurse: [place]  EGD    INDICATIONS FOR EXAMINATION   Abdominal pain for years after gastric bypass. Referred for inpatient EGD     Instruments:    Medications: 4mg  versed, 75mg  demerol, 25mg  benadryl IV, sedation start 14:23, out of room 14:40               The attending physician, Dr. Rozelle Logan, was present for the entire examination.  Procedure Technique: Patient's medications, allergies, past medical, surgical, social and family histories were reviewed and updated as appropriate. A discussion of informed consent was had with the patient and/or the patient's family prior to the   procedure, including sedation.  The alternatives, benefits and risks of the procedure including but not limited to pain, perforation, hemorrhage, infection, adverse drug reaction, missed lesion/incomplete procedure, tooth damage, and aspiration were   discussed    Description of Procedure:   Based on the pre-procedure assessment, including review of the patient's medical history, medications, allergies, and review of systems, the patient had been deemed to be an appropriate candidate for sedation; the patient was therefore sedated with the   medications listed. The patient was monitored continuously with pulse oximetry, blood pressure monitoring, and direct observations    The  was introduced and passed without difficulty to . A careful inspection was made as the endoscope was withdrawn.  Findings and interventions are described below:  Extent of Exam: jejunum.  Biopsy Obtained: No.  Complications: .   Estimated Blood Loss: none.   Need for Future Anesthesia: depends.    Total Procedure Time:       FINDINGS  GIFXP scope. Normal esophagus, gastric pouch, and  jejunum.    ENDOSCOPIC DIAGNOSIS  Normal exam of the upper GI tract after gastric bypass    RECOMMENDATIONS  -obtain MRCP to evaluate RUQ pain and dilated bile duct and pancreatic duct (double duct sign)    Signature:_________________________________ Nicola Police, M.D.            [phone])    Note:  The final official report is in the [facility] medical record.      This electronic signature authenticates all electronic and/or handwritten documentation, including orders, generated by the signer during the episode of care contained in this record.  07/13/2019 03:07:47 PM By Liam Rogers.D.

## 2019-07-13 NOTE — H&P (Signed)
History and Physical    Indication for procedure:  63 year old female with history of Roux-en-Y gastric bypass in 2010 c/b SBO who presents with 3 week history of abdominal pain with some conspecific gastric wall thickening on CT.    Pain Score: 0  Pain Location: Abdomen  Pain Description: Constant    Past Medical History:   Diagnosis Date   . Amphetamine abuse (CMS-HCC)    . Aortic aneurysm (CMS-HCC)    . Bipolar 1 disorder (CMS-HCC)    . Bipolar affective (CMS-HCC)    . COPD (chronic obstructive pulmonary disease) (CMS-HCC)    . Diabetes mellitus (CMS-HCC)    . Fracture, intertrochanteric, left femur (CMS-HCC)    . Gastric bypass status for obesity    . H/O: hysterectomy    . HLD (hyperlipidemia)    . HTN (hypertension)    . Manic depression (CMS-HCC)    . Obesity    . Opioid abuse (CMS-HCC)    . Polyarthropathy or polyarthritis of multiple sites    . Schizophrenia (CMS-HCC)      Past Surgical History:   Procedure Laterality Date   . GASTRIC BYPASS     . GASTRIC BYPASS     . hx gastric bypass     . s/p IMN left IT fracture 07/20/16     . STOMACH SURGERY     . Ventral Hernia repair       Allergies   Allergen Reactions   . Compazine Anaphylaxis     Per patient tolerates promethazine/Phenergan   . Gabapentin Hives   . Haldol [Haloperidol] Swelling   . Ondansetron Swelling   . Reglan [Metoclopramide] Anaphylaxis   . Ultram [Tramadol] Swelling   . Zofran [Ondansetron] Anaphylaxis   . Toradol Other     "my throat closes up and tongue swells up"     Prior to Admission Medications   Prescriptions Last Dose Informant Patient Reported? Taking?   LYRICA 200 MG capsule 07/11/2019  No Yes   Sig: Take 1 capsule (200 mg) by mouth 2 times daily.   atorvastatin (LIPITOR) 40 MG tablet 07/11/2019  No Yes   Sig: Take 1 tablet (40 mg) by mouth every evening.   cloNIDine (CATAPRES) 0.2 MG/24HR patch Past Week  Yes Yes   methocarbamol (ROBAXIN) 750 MG tablet 07/11/2019  No Yes   Sig: Take 1 tablet (750 mg) by mouth 4 times daily as  needed for Muscle Spasms.      Facility-Administered Medications: None       BP 119/76 (BP Location: Right arm, BP Patient Position: Semi-Fowlers)   Pulse (!) 45   Temp 97.4 F (36.3 C)   Resp 18   Ht 5\' 6"  (1.676 m)   Wt 101.2 kg (223 lb 1.7 oz)   SpO2 100%   BMI 36.01 kg/m   General: Well developed, well nourished, in no apparent distress.  Lungs: Clear breath sounds bilaterally.  CV: Normal rate, regular rhythm, no significant murmur present.  Abdomen: Soft, nontender, normal bowel sounds present.    ASA Score:  2   Airway (Mallimpati) Score:  Class IV - Only the hard palate is visible.    Assessment and Plan  Proceed to planned procedure.    The patient has consented to the procedure, which will be done with sedation.  I have assessed the patient's status immediately prior to this procedure.  I have discussed pain management needs and options for the patient with the patient or caregiver.  The patient agrees to be full code for the duration of the procedure.    Sedation options, risks, and plans have been discussed with the patient or caregiver.  Questions were answered.  The patient or caregiver agrees to proceed as planned.    Caren Griffins Li-Shin Amandajo Gonder

## 2019-07-13 NOTE — Plan of Care (Signed)
Problem: Promotion of Health and Safety  Goal: Promotion of Health and Safety  Description: The patient remains safe, receives appropriate treatment and achieves optimal outcomes (physically, psychosocially, and spiritually) within the limitations of the disease process by discharge.    Information below is the current care plan.  Outcome: Progressing  Flowsheets (Taken 07/13/2019 0101)  Patient /Family stated Goal: "Pain control".  Individualized Interventions/Recommendations #1: IVF infusing and maintained on clear liquids.  Individualized Interventions/Recommendations #2 (if applicable): PRN pain medications given as ordered and IV phenergan given for nausea.  Individualized Interventions/Recommendations #3 (if applicable): Pt. informed need to collect urine specimen, and NPO MN for possible EGD procedure-Pt. agreed.  Individualized Interventions/Recommendations #4 (if applicable): Safety precaution, clutter free environment.  Outcome Evaluation (rationale for progressing/not progressing) every shift: Pt. is calm, cooperative and agreed to current plan of care.  Continue on current plan of care.

## 2019-07-13 NOTE — Interdisciplinary (Signed)
Acknowledged  RN Admission Summary Weight Loss Trigger:    Weights (last 14 days)     Date/Time Weight Weight Source Percentage Weight Change (%) Who    07/12/19 1059  101.2 kg (223 lb 1.7 oz)  Bed scale  0 % MR    07/11/19 1645  101.2 kg (223 lb 1.7 oz)  --  0 % TO            Wt Readings from Last 20 Encounters:   07/12/19 101.2 kg (223 lb 1.7 oz)   11/23/18 101.2 kg (223 lb)   04/06/18 86.2 kg (190 lb)   04/02/18 87.1 kg (192 lb)   01/05/18 87.1 kg (192 lb 0.3 oz)   01/05/18 87.1 kg (192 lb)   07/20/16 104 kg (229 lb 4.5 oz)   02/17/16 79.4 kg (175 lb)   02/06/16 63.5 kg (140 lb)   09/25/15 93.9 kg (207 lb)   07/29/14 93.9 kg (207 lb)   08/23/13 93.9 kg (207 lb)   08/22/13 91.6 kg (202 lb)   06/29/13 87.1 kg (192 lb)   06/23/13 90.7 kg (200 lb)   03/24/13 88 kg (194 lb)   06/13/12 88 kg (194 lb)   04/24/12 85 kg (187 lb 6.3 oz)     UBW: 236lb  % Weight Loss from current weight: 5.5%  Time Frame of Weight loss: 2 months  WT loss: unintentional    Pt reported weight is down to 195lb last taken at doctors office, at which time MD did not discuss concern for wt loss.  PT reported weight loss occurred all of a sudden, etiology unknown, and denied changes to appetite, types of food consumed or quantity.   C/o recent abdominal pain, nausea, vomiting and diarrhea.    Diet: Diet NPO; Yes; Patient NPO for procedure; EGD  PO:   Tray Items Taken for the past 168 hrs:   Number of Items Taken Number of Items on Tray Diet Tolerance   07/12/19 1830 1.5 5 Tolerates     Food Allergies: NKFA    > Offered pt nutrition supplement, pt agreeable once diet advances, prefers strawberry flavor.    Relayed findings to RD via EMR. Will continue to f/u per policy.   Darrel Reach, DTR

## 2019-07-13 NOTE — Progress Notes (Signed)
DAILY PROGRESS NOTE     07/13/19     Current Hospital Stay: 2 days, admitted 07/11/19    Subjective:  No acute events overnight. Patient reports continued sharp abdominal pain, non-radiating. She reports nausea but denies any vomiting. She has not had a bowel movement during her hospital stay, but has been passing gas. She signed consent for EGD this morning, and has been NPO since midnight.     Review of Systems:  Patient reports abdominal pain and nausea.   Denies dysuria, diarrhea, vomiting.     Current Medications:  . atorvastatin  40 mg QPM   . lansoprazole  30 mg QAM AC   . polyethylene glycol  17 g Daily   . pregabalin  200 mg BID   . sodium chloride  3 mL Q8H     . sodium chloride     . sodium chloride 100 mL/hr at 07/13/19 0040     . acetaminophen  650 mg Q4H PRN    Or   . acetaminophen  650 mg Q4H PRN    Or   . acetaminophen  650 mg Q4H PRN   . acetaminophen-codeine  1 tablet Q4H PRN   . aluminum-magnesium-simethicone  30 mL Q6H PRN   . methocarbamol  750 mg 4x Daily PRN   . nalOXone  0.1 mg Q2 Min PRN   . promethazine (PHENERGAN) IVPB  12.5 mg Q6H PRN   . sodium chloride  3 mL PRN   . sodium chloride   Continuous PRN       Objective:  Vital Signs:  Temperature:  [97.4 F (36.3 C)-97.7 F (36.5 C)] 97.6 F (36.4 C) (10/21 0738)  Blood pressure (BP): (107-148)/(59-87) 148/85 (10/21 0738)  Heart Rate:  [45-50] 50 (10/21 0738)  Respirations:  [16-18] 17 (10/21 0738)  Pain Score: 9 (10/21 0821)  O2 Device: None (Room air) (10/21 0529)  SpO2:  [96 %-100 %] 98 % (10/21 0738)        Intake/Output (Current Shift):  10/20 0600 - 10/21 0559  In: 1289.3 [P.O.:370; I.V.:919.3]  Out: 600 [Urine:600]    Physical Exam:  General: Uncomfortable, not in acute distress, obese.   HEENT: EOM grossly intact, nonicteric sclera, atraumatic, normocephalic.   Neck: Right IJ catheter in place; supple  CV: Slow rate, regular rhythm, no murmurs, gallops, rubs.   Pulm: Nonlabored, normal respiratory rate, symmetric chest expansion,  clear to auscultation bilaterally without wheezes, crackles.   Abd: Soft, non-distended, tender to palpation diffusely, most tender 5cm right of umbilicus. Patient recoils with light placement of stethoscope on abdomen, bed is moved without pain. Normoactive bowel sounds. Midline abdominal scar.  Ext: Warm and well-perfused, no edema.   Skin: No rashes or jaundice.   Neuro: Alert and grossly oriented, cranial nerves grossly intact, moves all extremities spontaneously, no gross incoordination.    Diagnostic Data:  Laboratory data:   Lab Results   Component Value Date    WBC 5.5 07/13/2019    RBC 4.38 07/13/2019    HGB 11.7 07/13/2019    HCT 36.0 07/13/2019    MCV 82.2 07/13/2019    MCHC 32.5 07/13/2019    RDW 16.9 (H) 07/13/2019    PLT 247 07/13/2019    MPV 9.1 (L) 07/13/2019     Lab Results   Component Value Date    BUN 8 07/13/2019    CREAT 0.81 07/13/2019    CL 109 (H) 07/13/2019    NA 144 07/13/2019    K  4.0 07/13/2019    [place] 8.7 07/13/2019    TBILI 0.20 07/13/2019    ALB 3.5 07/13/2019    TP 5.6 (L) 07/13/2019    AST 22 07/13/2019    ALK 71 07/13/2019    BICARB 29 07/13/2019    ALT 21 07/13/2019    GLU 81 07/13/2019         Assessment and Plan:  Alexandria Proctor is a 63 year old female with a PMH of multiple abdominal surgeries including gastric bypass, hysterectomy, COPD, HTN, history of amphetamine use, who presented with a three-week history of intermittent abdominal pain which she has been evaluated for in the ED three times prior who presents with one week of constant and progressive abdominal pain associated with nausea, vomiting, and diarrhea.    #Abdominal pain  Patient with significant tenderness to palpation of her abdomen associated with reported nausea/vomiting/diarrhea, constant and worsening over the past week, exacerbated by eating. Unremarkable workups at prior ED visits, reassuring labs so far, but failed PO challenge. No acute abnormalities on CTAP, lipase and lactate wnl, LFTs wnl, no  leukocytosis, UA unremarkable, no palpable masses or organomegaly. Pending EGD for evaluation of wall thickening. Suspect patient may be exaggerating pain on certain exam maneuvers, but will continue to monitor for signs of bleeding/perforation/peritonitis.   - GI consult - planning EGD 10/21, appreciate assistance and recommendations  - GI pathogen panel pending  - Lansoprazole 66m daily  - Phenergan 12.569mIV q6h PRN nausea/vomiting - patient reports she has tolerated this medication in the past without reaction  - Maalox q6h PRN  - Tylenol #3 PRN pain  - Clear liquid diet; NPO since midnight for EGD    #Chronic pain  - Lyrica 20017mID  - Robaxin 750m29mD PRN muscle spasms    #HTN   - Hold clonidine patch for now    #Hyperlipidemia  - Atorvastatin 40mg43mly    IVF: NS 100ml/24mDVT ppx: Lovenox  Diet: Clear liquids, NPO since midnight for procedure.    Orders Placed This Encounter      Full Code - Call Code    This patient and her care were discussed with the attending physician, Dr. VarughMelina FiddlerNoe CaNigel BertholdPsychiatry  PGY-1    ATTENDING PROGRESS NOTE ATTESTATION    Subjective    I have reviewed the history.  Interval history:  States he had intermittent abdominal pain.     Objective    I have examined the patient and concur with the resident exam.    Assessment and Plan    I agree with the resident care plan.  -EGD with no acute findings  -Advance diet   See the resident note for further details.

## 2019-07-14 ENCOUNTER — Other Ambulatory Visit: Payer: Self-pay

## 2019-07-14 LAB — MRSA SURVEILLANCE CULTURE

## 2019-07-14 MED ORDER — SODIUM CHLORIDE 0.9 % IV SOLN
12.5000 mg | Freq: Once | INTRAVENOUS | Status: AC
Start: 2019-07-14 — End: 2019-07-14
  Administered 2019-07-14: 12.5 mg via INTRAVENOUS
  Filled 2019-07-14: qty 0.5

## 2019-07-14 NOTE — Plan of Care (Signed)
Problem: Promotion of Health and Safety  Goal: Promotion of Health and Safety  Description: The patient remains safe, receives appropriate treatment and achieves optimal outcomes (physically, psychosocially, and spiritually) within the limitations of the disease process by discharge.    Information below is the current care plan.  07/14/2019 2050 by Chapman Fitch, RN  Outcome: Discharged  Flowsheets  Taken 07/14/2019 2050  Guidelines: Inpatient Nursing Guidelines  Individualized Interventions/Recommendations #1: Patient had been reporting severe nausea and pain, prn medications administered as ordered with no verbalized relief though appears to be resting comfortably.  Individualized Interventions/Recommendations #2 (if applicable): Clustered care to promote rest and healing.  Outcome Evaluation (rationale for progressing/not progressing) every shift: Patient medically cleared for discharge, IJ removed by MD after patient recieved both pain medication and prn anti-nausea. Patient discharged with all personal belongings in wheelchair with discharge RN as well as Security to ensure patient would leave hospital.  Taken 07/14/2019 0945  Patient /Family stated Goal: I need my pain and nausea controlled  07/14/2019 1123 by Chapman Fitch, RN  Outcome: Progressing  Flowsheets (Taken 07/13/2019 1500)  Patient /Family stated Goal: I need my pain controlled  Guidelines: Inpatient Nursing Guidelines  Individualized Interventions/Recommendations #1: Patient reporting severe pain, prn pain medication administered as ordered with verbalized relief.  Individualized Interventions/Recommendations #2 (if applicable): Patient reporting nausea, monitored for emesis, IV Phenergan administered as ordered.  Individualized Interventions/Recommendations #3 (if applicable): Clustering care to promote rest and healing.  Individualized Interventions/Recommendations #4 (if applicable): Patient educated on high fall risk status,  patient refusing bed alarm but all other fall precuations in place. Bed is in lowest position, call light and personal belongings in place.  Outcome Evaluation (rationale for progressing/not progressing) every shift: Patient is alert and oriented, VSS. Patient mostly calm and cooperative throughout beginning of shift requiring occasional limit setting and redirection. Patient reporting severe nausea and pain prn medications administered as ordered. Patient appeared to be resting comfortably and is free from acute distress at this time. Will continue to monitor patient for any changes.  Note: Plan of care for day shift 07/13/2019

## 2019-07-14 NOTE — Plan of Care (Signed)
Problem: Promotion of Health and Safety  Goal: Promotion of Health and Safety  Description: The patient remains safe, receives appropriate treatment and achieves optimal outcomes (physically, psychosocially, and spiritually) within the limitations of the disease process by discharge.    Information below is the current care plan.  Outcome: Progressing  Flowsheets (Taken 07/13/2019 1500)  Patient /Family stated Goal: I need my pain controlled  Guidelines: Inpatient Nursing Guidelines  Individualized Interventions/Recommendations #1: Patient reporting severe pain, prn pain medication administered as ordered with verbalized relief.  Individualized Interventions/Recommendations #2 (if applicable): Patient reporting nausea, monitored for emesis, IV Phenergan administered as ordered.  Individualized Interventions/Recommendations #3 (if applicable): Clustering care to promote rest and healing.  Individualized Interventions/Recommendations #4 (if applicable): Patient educated on high fall risk status, patient refusing bed alarm but all other fall precuations in place. Bed is in lowest position, call light and personal belongings in place.  Outcome Evaluation (rationale for progressing/not progressing) every shift: Patient is alert and oriented, VSS. Patient mostly calm and cooperative throughout beginning of shift requiring occasional limit setting and redirection. Patient reporting severe nausea and pain prn medications administered as ordered. Patient appeared to be resting comfortably and is free from acute distress at this time. Will continue to monitor patient for any changes.  Note: Plan of care for day shift 07/13/2019

## 2019-07-14 NOTE — Plan of Care (Signed)
Problem: Promotion of Health and Safety  Goal: Promotion of Health and Safety  Description: The patient remains safe, receives appropriate treatment and achieves optimal outcomes (physically, psychosocially, and spiritually) within the limitations of the disease process by discharge.    Information below is the current care plan.  Outcome: Progressing  Flowsheets  Taken 07/14/2019 0443  Guidelines: Inpatient Nursing Guidelines  Individualized Interventions/Recommendations #1: Monitor for pain. Admin pain meds as ordered.  Individualized Interventions/Recommendations #2 (if applicable): Monitor for nausea. IV phenergan Q6 prn ordered.  Individualized Interventions/Recommendations #3 (if applicable): Cluster care to promote rest and healing  Outcome Evaluation (rationale for progressing/not progressing) every shift: VSS. Pt calm and cooperative. Educated pt on plan for MD to remove sutured R IJ access then discharge. Pt cont to report pain and nausea. Meds given as ordered. Will cont to monitor.  Taken 07/13/2019 1930  Patient /Family stated Goal: pain meds

## 2019-07-15 ENCOUNTER — Telehealth (HOSPITAL_BASED_OUTPATIENT_CLINIC_OR_DEPARTMENT_OTHER): Payer: Self-pay

## 2019-07-15 LAB — CONFIRM OPIATES, URINE
Conf. 6 Acetylmorphine: NEGATIVE ng/mL (ref 0–9)
Conf. Codeine: POSITIVE ng/mL — AB (ref 0–99)
Conf. EDDP: NEGATIVE ng/mL (ref 0–99)
Conf. Fentanyl: NEGATIVE ng/mL (ref 0–4)
Conf. Hydrocodone: NEGATIVE ng/mL (ref 0–99)
Conf. Hydromorphone: NEGATIVE ng/mL (ref 0–99)
Conf. Methadone: NEGATIVE ng/mL (ref 0–99)
Conf. Morphine: POSITIVE ng/mL — AB (ref 0–99)
Conf. Norcodeine: POSITIVE ng/mL — AB (ref 0–99)
Conf. Norfentanyl: NEGATIVE ng/mL (ref 0–4)
Conf. Norhydrocodone: NEGATIVE ng/mL (ref 0–99)
Conf. Noroxycodone: NEGATIVE ng/mL (ref 0–49)
Conf. Oxycodone: NEGATIVE ng/mL (ref 0–49)
Conf. Oxymorphone: NEGATIVE ng/mL (ref 0–49)

## 2019-07-15 NOTE — Telephone Encounter (Signed)
Transitional Telephonic Nurse (TTN) Post Discharge Manual Follow-Up Telephone Encounter   Inpatient Encounter CSN: [id number]  Admission Date: 2019-07-11  Discharge Date: 2019-07-14  Autocall Status: No Response  Call Date: 2019-07-15  Contact Number: [phone]  Primary Diagnosis: ABDOMINAL PAIN  Clinician: Redge Gainer MSN, RN  Phone: [phone]   Email: [email or web address]  2019-07-15 14:37:53 - Patient Not Reached; Encounter Closed No Answer/Voicemail Not Available;   Attempted 2 numbers listed for patient on chart:  [phone] (H)   [phone] (M)  These numbers are not in service. Noted a number for daughter but no permission to contact found. Patient not reached.

## 2019-07-18 ENCOUNTER — Telehealth (HOSPITAL_BASED_OUTPATIENT_CLINIC_OR_DEPARTMENT_OTHER): Payer: Self-pay

## 2019-07-18 NOTE — Telephone Encounter (Signed)
Transitional Telephonic Nurse (TTN) Post Discharge Manual Follow-Up Telephone Encounter  Inpatient Encounter CSN: [id number]  Admission Date: 2019-07-11  Discharge Date: 2019-07-14  Autocall Status: No Response  Call Date: 2019-07-15  Contact Number: [phone]  Primary Diagnosis: ABDOMINAL PAIN    Clinician: Dana Allan MSN, RN  Phone: [phone]  Email: [email or web address]      2019-07-18 09:13:24 - Pending Resolution; Additional Outreach      2019-07-18 09:24:03 - Patient Not Reached; Encounter Closed  [phone]- Alexandria Proctor  [phone] -H    Called both numbers as above- " subscriber not in service. " Daughter is not listed as with permission to contact. Patient not reached.

## 2019-07-21 ENCOUNTER — Telehealth (INDEPENDENT_AMBULATORY_CARE_PROVIDER_SITE_OTHER): Payer: Self-pay | Admitting: Physician Assistant

## 2019-07-25 ENCOUNTER — Other Ambulatory Visit (INDEPENDENT_AMBULATORY_CARE_PROVIDER_SITE_OTHER): Payer: Self-pay | Admitting: Physician Assistant

## 2019-07-25 MED ORDER — METHOCARBAMOL 750 MG OR TABS
750.0000 mg | ORAL_TABLET | Freq: Four times a day (QID) | ORAL | 0 refills | Status: DC | PRN
Start: 2019-07-25 — End: 2019-08-24

## 2019-07-30 ENCOUNTER — Telehealth (INDEPENDENT_AMBULATORY_CARE_PROVIDER_SITE_OTHER): Payer: Self-pay | Admitting: Internal Medicine

## 2019-07-30 ENCOUNTER — Encounter (INDEPENDENT_AMBULATORY_CARE_PROVIDER_SITE_OTHER): Admitting: Internal Medicine

## 2019-08-02 ENCOUNTER — Telehealth (INDEPENDENT_AMBULATORY_CARE_PROVIDER_SITE_OTHER): Payer: Self-pay | Admitting: Physician Assistant

## 2019-08-02 ENCOUNTER — Telehealth (INDEPENDENT_AMBULATORY_CARE_PROVIDER_SITE_OTHER): Admitting: Physician Assistant

## 2019-08-10 ENCOUNTER — Other Ambulatory Visit: Payer: Self-pay

## 2019-08-13 ENCOUNTER — Other Ambulatory Visit (INDEPENDENT_AMBULATORY_CARE_PROVIDER_SITE_OTHER): Payer: Self-pay | Admitting: Physician Assistant

## 2019-08-16 MED ORDER — LYRICA 200 MG OR CAPS
200.00 mg | ORAL_CAPSULE | Freq: Two times a day (BID) | ORAL | 1 refills | Status: DC
Start: 2019-08-16 — End: 2019-10-19

## 2019-08-24 ENCOUNTER — Telehealth (INDEPENDENT_AMBULATORY_CARE_PROVIDER_SITE_OTHER): Payer: Self-pay | Admitting: Physician Assistant

## 2019-08-24 NOTE — Telephone Encounter (Signed)
Patient is calling to request a refill. Medication added.  Patient is currently out of their medication (Yes message routed as high priority). Current allergies and pharmacy have been confirmed with the patient prior to routing. Yes Patient is aware that all medication requests can take up to 72 hours for approval or denial. Last office visit discussing this medication is 07-30-2019    Requested Prescriptions     Pending Prescriptions Disp Refills   . methocarbamol (ROBAXIN) 750 MG tablet 120 tablet 0     Sig: Take 1 tablet (750 mg) by mouth 4 times daily as needed for Muscle Spasms.     - pt states its supposed to be 500 mg?

## 2019-08-25 ENCOUNTER — Ambulatory Visit (INDEPENDENT_AMBULATORY_CARE_PROVIDER_SITE_OTHER)

## 2019-08-25 ENCOUNTER — Telehealth (INDEPENDENT_AMBULATORY_CARE_PROVIDER_SITE_OTHER): Payer: Self-pay

## 2019-08-25 NOTE — Telephone Encounter (Incomplete)
COVID-19 Assessment and Triage      Please note:  High risk category no longer required for testing.  Please continue to document which category they are in for risk stratification of results.    Use as part of cough, fever, URI or shortness of breath triage:  Do you have a fever OR a new cough or shortness of breath (SOB) no  or  Other symptoms seen in COVID 19 patients include NEW headache, chills with or without repeated shaking, chest tightness, anosmia (loss of smell), ageuisa (loss of taste), sore throat, diarrhea, severe, muscle pain, and unexplained fatigue or syncope (fainting). no  Scratchy throat or a runny nose may also be symptoms, but when present in isolation would not prompt testing.        Does patient belong to one of the following categories (not required for testing)?  [x] High risk includes age>65, active smoker, chronic lung disease, chronic heart/live/kidney disease, diabetes, immunosuppression, active cancer   [] Any resident of or provider working in a senior living facility, including skilled nursing facilities or assisted living facilities.  [] Contact to known COVID-19 cases.  [] Health care worker or first responder or frontline worker like delivery or grocery  [] Persons who care for the elderly.  [x] Persons experiencing homelessness.  [] Travel in the last 14 days?    [] Close contact with anyone in above above categories?   Pregnant?    No    If NO to symptoms then schedule as usual.    IF YES to symptoms call physician or clinic for additional instructions.

## 2019-08-25 NOTE — Telephone Encounter (Signed)
LMTCB to change appointment to a virtual visit if at all possible. PT is in high risk category.

## 2019-08-25 NOTE — Telephone Encounter (Signed)
Rx refill for lyrica and amoxicillin. She was suppose to follow up with dental for her swollen gums but pt stated they refused to see her because of COVID.    Left a voicemail that we cancelled her appt. And move forward with a pocketdoc instead. Via [facility] request for possible covid symptoms.

## 2019-08-26 ENCOUNTER — Telehealth (INDEPENDENT_AMBULATORY_CARE_PROVIDER_SITE_OTHER): Admitting: Physician Assistant

## 2019-08-26 ENCOUNTER — Ambulatory Visit (INDEPENDENT_AMBULATORY_CARE_PROVIDER_SITE_OTHER): Admitting: Medical

## 2019-08-26 ENCOUNTER — Telehealth (INDEPENDENT_AMBULATORY_CARE_PROVIDER_SITE_OTHER): Payer: Self-pay

## 2019-08-26 ENCOUNTER — Encounter (INDEPENDENT_AMBULATORY_CARE_PROVIDER_SITE_OTHER): Payer: Self-pay | Admitting: Physician Assistant

## 2019-08-26 MED ORDER — METHOCARBAMOL 500 MG OR TABS
750.0000 mg | ORAL_TABLET | Freq: Four times a day (QID) | ORAL | 0 refills | Status: DC | PRN
Start: 2019-08-26 — End: 2019-09-22

## 2019-08-26 MED ORDER — AMOXICILLIN-POT CLAVULANATE 875-125 MG OR TABS
1.0000 | ORAL_TABLET | Freq: Two times a day (BID) | ORAL | 0 refills | Status: AC
Start: 2019-08-26 — End: 2019-09-05

## 2019-08-26 NOTE — Progress Notes (Signed)
SUBJECTIVE: Alexandria Proctor is a 63 year old female who presents to clinic for TMJ (Swollen gum and would like antibiotics) and Refill Request    Patient presents via TELEPHONE to discuss symptoms    Dental Problem   This is a recurrent (its suppose to get gum surgery but postponed right) problem. Episode onset: newest symptoms started in last couple of days. The problem has been gradually worsening. The pain is moderate. Pertinent negatives include no difficulty swallowing or fever. Associated symptoms comments: +gum swelling, possible abscess. Treatments tried: good oral hygeine  The treatment provided no relief.       Review of Systems   Constitutional: Negative for fever.   HENT: Positive for dental problem. Negative for facial swelling, sore throat and trouble swallowing.    : noncontributory other than what was discussed in HPI     Current Outpatient Medications on File Prior to Visit   Medication Sig Dispense Refill   . aluminum-magnesium-simethicone (MAG-AL PLUS) 200-200-20 MG/5ML suspension Take 30 mL by mouth every 6 hours as needed for Heartburn (abdominal pain). 355 mL 0   . atorvastatin (LIPITOR) 40 MG tablet Take 1 tablet (40 mg) by mouth every evening. 14 tablet 1   . cloNIDine (CATAPRES) 0.2 MG/24HR patch      . LYRICA 200 MG capsule TAKE 1 CAPSULE (200 MG) BY MOUTH 2 TIMES DAILY. 180 capsule 1   . methocarbamol (ROBAXIN) 500 MG tablet Take 1.5 tablets (750 mg) by mouth 4 times daily as needed for Muscle Spasms. 120 tablet 0   . [DISCONTINUED] methocarbamol (ROBAXIN) 750 MG tablet Take 1 tablet (750 mg) by mouth 4 times daily as needed for Muscle Spasms. 120 tablet 0     No current facility-administered medications on file prior to visit.      Immunization History   Administered Date(s) Administered   . Pneumococcal 23 Vaccine (PNEUMOVAX-23) 10/20/2018   Deferred Date(s) Deferred   . Influenza Vaccine (High Dose) >=65 Years 07/13/2019   . Pneumococcal 23 Vaccine (PNEUMOVAX-23) 04/20/2018          Allergies   Allergen Reactions   . Compazine Anaphylaxis     Per patient tolerates promethazine/Phenergan   . Gabapentin Hives   . Haldol [Haloperidol] Swelling   . Ondansetron Swelling   . Reglan [Metoclopramide] Anaphylaxis   . Ultram [Tramadol] Swelling   . Zofran [Ondansetron] Anaphylaxis   . Toradol Other     "my throat closes up and tongue swells up"     Patient Active Problem List    Diagnosis Date Noted   . Abdominal pain 07/12/2019   . Intractable abdominal pain 07/11/2019     Added automatically from request for surgery [id number]     . Aortic aneurysm (CMS-HCC)    . Psychosis (CMS-HCC) 04/20/2018   . Suicidal ideation 04/15/2018   . Major depressive disorder 04/15/2018   . Fibromyalgia 04/15/2018   . GERD (gastroesophageal reflux disease) 04/15/2018   . Vitamin D deficiency 04/15/2018   . Chronic abdominal pain 04/15/2018   . Bipolar affective (CMS-HCC)    . Amphetamine abuse (CMS-HCC)    . Opioid abuse (CMS-HCC)    . Fracture, intertrochanteric, left femur (CMS-HCC)    . HTN (hypertension)    . HLD (hyperlipidemia)    . Obesity    . Closed displaced fracture of left femoral neck (CMS-HCC) 07/19/2016   . Benign neoplasm of colon removed during colonoscopy 06/16/2012   . Abnormal liver function test 06/15/2012   .  Abdominal pain, acute suspected to be due to gastroenteritis 06/14/2012   . Abdominal Lipoma 06/14/2012   . Suicide ideation cleared by Psychiatry for Discharge 06/14/2012     Past Medical History:   Diagnosis Date   . Amphetamine abuse (CMS-HCC)    . Aortic aneurysm (CMS-HCC)    . Bipolar 1 disorder (CMS-HCC)    . Bipolar affective (CMS-HCC)    . COPD (chronic obstructive pulmonary disease) (CMS-HCC)    . Diabetes mellitus (CMS-HCC)    . Fracture, intertrochanteric, left femur (CMS-HCC)    . Gastric bypass status for obesity    . H/O: hysterectomy    . HLD (hyperlipidemia)    . HTN (hypertension)    . Manic depression (CMS-HCC)    . Obesity    . Opioid abuse (CMS-HCC)    . Polyarthropathy or  polyarthritis of multiple sites    . Schizophrenia (CMS-HCC)      Past Surgical History:   Procedure Laterality Date   . GASTRIC BYPASS     . GASTRIC BYPASS     . hx gastric bypass     . s/p IMN left IT fracture 07/20/16     . STOMACH SURGERY     . Ventral Hernia repair       Social History     Tobacco Use   . Smoking status: Current Every Day Smoker     Packs/day: 1.00     Types: Cigarettes   . Smokeless tobacco: Never Used   Substance Use Topics   . Alcohol use: Not Currently     Alcohol/week: 0.0 standard drinks   . Drug use: Not Currently     Types: Methamphetamines, Marijuana     Family History   Problem Relation Name Age of Onset   . Schizophrenia Mother     . Hypertension Mother       Family Status   Relation Status   . Mo Deceased       OBJECTIVE  :  There were no vitals filed for this visit.  There is no height or weight on file to calculate BMI.        Physical Exam  Psychiatric:         Speech: Speech normal.         ASSESSMENT AND PLAN:  H&P reviwed, exam Completed, addressed questions and complaints  Diagnoses and all orders for this visit:    Dental infection  -     amoxicillin-clavulanate (AUGMENTIN) 875-125 MG tablet; Take 1 tablet by mouth 2 times daily for 10 days.  - Continue with good oral hygiene  - Monitor signs and symptoms; ER precautions for severe pain, new fever, unable to tolerate liquids/food  - Follow up with dentist  - patient agrees to above plan      FOLLOW UP  Return if symptoms worsen or fail to improve.  No future appointments.    Loleta Books PA-C  Attending Physician: Gale Journey, MD

## 2019-08-26 NOTE — Telephone Encounter (Signed)
COVID-19 Screening    Within the past 15 days, have you had any of the following symptoms?  Yes/No   [][x] Fever    [][x] New cough or shortness of breath (SOB)    [][x] NEW headache   [][x] Chills with or without repeated shaking    [][x] Chest tightness   [][x] Anosmia (loss of smell)   [][x] Ageusia (loss of taste)   [][x] Sore throat   [][x] Diarrhea    [][x] Severe, muscle pain    [][x] Unexplained fatigue or syncope (fainting)     Do you (patient) belong in any of these categories?   Yes/No   [][x] High risk includes age>65, active smoker, chronic lung disease, chronic heart/liver/kidney disease, diabetes, immunosuppression, active cancer    [][x] Any resident of or provider working in a senior living facility, including skilled nursing facilities or assisted living facilities.    [][x] Contact with any known COVID-19 cases.    [][x] Health care worker or first responder or frontline worker like delivery or grocery    [][x] Persons who care for the elderly.    [][x] Persons experiencing homelessness.    [][x] Travel in the last 15 days.   [][x] Pregnant.   [][x] Close contact with anyone in the above categories?      ONLY IF PATIENT ANSWERED YES TO ANY OF THE QUESTIONS, PLEASE CHECK WITH PROVIDER FOR AN IN OFFICE APPOINTMENT.

## 2019-08-26 NOTE — Telephone Encounter (Addendum)
rx sent, please let pt know.

## 2019-08-29 NOTE — Telephone Encounter (Signed)
Lvmtcb. If patient calls back please infirm message below.

## 2019-08-31 NOTE — Telephone Encounter (Signed)
Patient informed. 

## 2019-09-02 ENCOUNTER — Other Ambulatory Visit (INDEPENDENT_AMBULATORY_CARE_PROVIDER_SITE_OTHER): Payer: Self-pay | Admitting: Physician Assistant

## 2019-09-22 ENCOUNTER — Telehealth (INDEPENDENT_AMBULATORY_CARE_PROVIDER_SITE_OTHER): Payer: Self-pay | Admitting: Family Medicine

## 2019-09-22 ENCOUNTER — Ambulatory Visit (INDEPENDENT_AMBULATORY_CARE_PROVIDER_SITE_OTHER): Admitting: Physician Assistant

## 2019-09-22 ENCOUNTER — Telehealth (INDEPENDENT_AMBULATORY_CARE_PROVIDER_SITE_OTHER): Payer: Self-pay | Admitting: Physician Assistant

## 2019-09-22 ENCOUNTER — Ambulatory Visit (INDEPENDENT_AMBULATORY_CARE_PROVIDER_SITE_OTHER): Admitting: Family Medicine

## 2019-09-22 ENCOUNTER — Telehealth (INDEPENDENT_AMBULATORY_CARE_PROVIDER_SITE_OTHER): Admitting: Cardiovascular Disease

## 2019-09-22 ENCOUNTER — Encounter (INDEPENDENT_AMBULATORY_CARE_PROVIDER_SITE_OTHER): Payer: Self-pay | Admitting: Family Medicine

## 2019-09-22 VITALS — BP 124/88 | HR 76 | Temp 96.9°F | Resp 21 | Ht 66.0 in | Wt 212.4 lb

## 2019-09-22 MED ORDER — METHOCARBAMOL 500 MG OR TABS
750.00 mg | ORAL_TABLET | Freq: Four times a day (QID) | ORAL | 0 refills | Status: DC | PRN
Start: 2019-09-22 — End: 2019-10-17

## 2019-09-22 MED ORDER — LANSOPRAZOLE 30 MG OR CPDR
30.00 mg | DELAYED_RELEASE_CAPSULE | Freq: Every day | ORAL | 0 refills | Status: DC
Start: 2019-09-22 — End: 2019-10-19

## 2019-09-22 NOTE — Telephone Encounter (Signed)
Confirmed with Kern Alberta the appointment, he informed me that the patient is currently homeless so it must be a PocketDoc.

## 2019-09-22 NOTE — Telephone Encounter (Signed)
COVID-19 Assessment and Triage      Please note:  High risk category no longer required for testing.  Please continue to document which category they are in for risk stratification of results.    Use as part of cough, fever, URI or shortness of breath triage:  Do you have a fever OR a new cough or shortness of breath (SOB) no  or  Other symptoms seen in COVID 19 patients include NEW headache, chills with or without repeated shaking, chest tightness, anosmia (loss of smell), ageuisa (loss of taste), sore throat, diarrhea, severe muscle pain, and unexplained fatigue or syncope (fainting). no  Scratchy throat or a runny nose may also be symptoms, but when present in isolation would not prompt testing.        Does patient belong to one of the following categories (not required for testing)?  []High risk includes age>65, active smoker, chronic lung disease, chronic heart/liver/kidney disease, diabetes, immunosuppression, active cancer   []Any resident of or provider working in a senior living facility, including skilled nursing facilities or assisted living facilities.  []Health care worker or first responder or frontline worker like delivery or grocery  []Persons who care for the elderly.  []Persons experiencing homelessness.  []Any travel in the last 14 days?    []Are you waiting on results for a COVID-19 test?  []Have you been exposed to Corona Virus in the last 14 days or had contact to known COVID-19 cases?  []Close contact with anyone in above categories?   Pregnant?    No    If NO to symptoms then schedule as usual.    IF YES to symptoms call physician or clinic for additional instructions.

## 2019-09-22 NOTE — Progress Notes (Signed)
[name] Outpatient Note    HPI: Alexandria Proctor is a 63 year old female who presents to clinic for Refill Request (Pt requesting refills), Wrist Pain, and Other (Dry mouth in A.M)    She has noticed right wrist pain that is radiating to her arm that started yesterday.  The pain makes her feel like she broke her wrist, it is sharp and tight in nature.  The pain was so bad it woke her up from sleep.  She denies any injury.  She does have intermittent numbness and tingling in her wrist. She needs refills of her prevacid and robaxin which she takes for GERD and muscle spasms.  The patient also wants to clarify that her lyrica is 200 mg three times a day and not twice a day.     Review of Systems   Constitutional: Positive for activity change.   HENT: Negative.    Respiratory: Negative.    Cardiovascular: Negative.    Gastrointestinal: Negative.    Musculoskeletal: Positive for arthralgias and myalgias.   Psychiatric/Behavioral: Negative.    : Negative except for those in HPI    Current Outpatient Medications on File Prior to Visit   Medication Sig Dispense Refill   . aluminum-magnesium-simethicone (MAG-AL PLUS) 200-200-20 MG/5ML suspension Take 30 mL by mouth every 6 hours as needed for Heartburn (abdominal pain). 355 mL 0   . atorvastatin (LIPITOR) 40 MG tablet Take 1 tablet (40 mg) by mouth every evening. 14 tablet 1   . cloNIDine (CATAPRES) 0.2 MG/24HR patch        No current facility-administered medications on file prior to visit.      Allergies   Allergen Reactions   . Amitriptyline Anaphylaxis   . Compazine Anaphylaxis     Per patient tolerates promethazine/Phenergan   . Gabapentin Hives   . Haldol [Haloperidol] Swelling   . Ondansetron Swelling   . Reglan [Metoclopramide] Anaphylaxis   . Ultram [Tramadol] Swelling   . Zofran [Ondansetron] Anaphylaxis   . Toradol Other     "my throat closes up and tongue swells up"     Patient Active Problem List    Diagnosis Date Noted   . Abdominal pain 07/12/2019   .  Intractable abdominal pain 07/11/2019     Added automatically from request for surgery [id number]     . Aortic aneurysm (CMS-HCC)    . Psychosis (CMS-HCC) 04/20/2018   . Suicidal ideation 04/15/2018   . Major depressive disorder 04/15/2018   . Fibromyalgia 04/15/2018   . GERD (gastroesophageal reflux disease) 04/15/2018   . Vitamin D deficiency 04/15/2018   . Chronic abdominal pain 04/15/2018   . Bipolar affective (CMS-HCC)    . Amphetamine abuse (CMS-HCC)    . Opioid abuse (CMS-HCC)    . Fracture, intertrochanteric, left femur (CMS-HCC)    . HTN (hypertension)    . HLD (hyperlipidemia)    . Obesity    . Closed displaced fracture of left femoral neck (CMS-HCC) 07/19/2016   . Benign neoplasm of colon removed during colonoscopy 06/16/2012   . Abnormal liver function test 06/15/2012   . Abdominal pain, acute suspected to be due to gastroenteritis 06/14/2012   . Abdominal Lipoma 06/14/2012   . Suicide ideation cleared by Psychiatry for Discharge 06/14/2012     Past Medical History:   Diagnosis Date   . Amphetamine abuse (CMS-HCC)    . Aortic aneurysm (CMS-HCC)    . Bipolar 1 disorder (CMS-HCC)    . Bipolar affective (CMS-HCC)    .  COPD (chronic obstructive pulmonary disease) (CMS-HCC)    . Diabetes mellitus (CMS-HCC)    . Fracture, intertrochanteric, left femur (CMS-HCC)    . Gastric bypass status for obesity    . H/O: hysterectomy    . HLD (hyperlipidemia)    . HTN (hypertension)    . Manic depression (CMS-HCC)    . Obesity    . Opioid abuse (CMS-HCC)    . Polyarthropathy or polyarthritis of multiple sites    . Schizophrenia (CMS-HCC)      Past Surgical History:   Procedure Laterality Date   . GASTRIC BYPASS     . GASTRIC BYPASS     . hx gastric bypass     . s/p IMN left IT fracture 07/20/16     . STOMACH SURGERY     . Ventral Hernia repair       Social History     Tobacco Use   . Smoking status: Current Every Day Smoker     Packs/day: 1.00     Types: Cigarettes   . Smokeless tobacco: Never Used   Substance Use Topics   .  Alcohol use: Not Currently     Alcohol/week: 0.0 standard drinks   . Drug use: Not Currently     Types: Methamphetamines, Marijuana     Family History   Problem Relation Name Age of Onset   . Schizophrenia Mother     . Hypertension Mother         Objective:  Vitals:    09/22/19 1337   BP: 124/88   Pulse: 76   Resp: 21   Temp: 96.9 F (36.1 C)   SpO2: 98%   Weight: 96.3 kg (212 lb 6.4 oz)   Height: 5\' 6"  (1.676 m)     Body mass index is 34.28 kg/m.    Wt Readings from Last 5 Encounters:   10/19/19 94.4 kg (208 lb 3.2 oz)   09/22/19 96.3 kg (212 lb 6.4 oz)   07/12/19 101.2 kg (223 lb 1.7 oz)   11/23/18 101.2 kg (223 lb)   04/06/18 86.2 kg (190 lb)     Blood Pressure   10/19/19 140/80   09/22/19 124/88   07/14/19 142/82   07/07/19 125/70   07/06/19 114/75     Physical Exam  GEN: in NAD, A&O, pleasant, non-toxic appearing  EENT: No injection or icterus.   Lungs: clear to auscultation bilaterally. No chest deformities noted.  No wheeze/rales/rhonchi   CV: regular rate & rhythm, nl S1, S2. No murmurs  Neurologic: Mentation appropriate. No facial droop.  No tremor.  Psych: Alert and Oriented. Affect appropriate.  Skin: exposed areas free of rash and no significant lesions  Ext:  Patient has completely normal range of motion in both wrists       ASSESSMENT AND PLAN:  1. Muscle spasm  - refill Robaxin    2. Gastroesophageal reflux disease, unspecified whether esophagitis present  - refill Prevacid    3. Wrist pain  - monitor for now, patient is advised to wear a wrist brace and to limit activities that worsen pain.      Health Maintenance   Topic Date Due   . Breast Cancer Screen  1955/12/10   . Tetanus (1 - Tdap) 10/25/1972   . Shingles Vaccine (1 of 2) 10/26/2003   . Medicare Annual Wellness Visit  08/08/2016   . Osteoporosis Screening  10/25/2018   . Influenza (1) 04/23/2019   . CC Annual Fall Risk Screen  10/18/2020   . Colorectal Cancer Screening  06/15/2022   . Pneumococcal 65+ yrs (1 of 1 - PPSV23) 10/21/2023   .  Hepatitis C Screening  Completed   . Polio Vaccine  Aged Out   . HPV Vaccine <= 26 Yrs  Aged Out   . Meningococcal MCV4 Vaccine  Aged Out     Follow up  No follow-ups on file.    Janai Maudlin Ardelle Lesches, MD

## 2019-09-22 NOTE — Telephone Encounter (Signed)
Called patient to inform them their arm would be better assessed in person, lmtcb regarding their PocketDoc appointment at 10:00 AM today.

## 2019-10-07 ENCOUNTER — Telehealth (INDEPENDENT_AMBULATORY_CARE_PROVIDER_SITE_OTHER): Admitting: Physician Assistant

## 2019-10-07 ENCOUNTER — Ambulatory Visit (INDEPENDENT_AMBULATORY_CARE_PROVIDER_SITE_OTHER): Admitting: Medical

## 2019-10-07 ENCOUNTER — Telehealth (INDEPENDENT_AMBULATORY_CARE_PROVIDER_SITE_OTHER): Payer: Self-pay

## 2019-10-07 NOTE — Telephone Encounter (Signed)
Pt called back she was upset relied the message from previous TE she insisted to get her lyrica refilled. Contacted [facility] office and MA stated per provider she needed to be seen. She had 2 no shows today. I offered to reschedule her with another provider she refused wanted to keep her on hold until she spoke to one of the providers she was scheduled with. Told pt I was able to send a message to provider she got mad hung up.

## 2019-10-07 NOTE — Telephone Encounter (Signed)
Called pt on both numbers but no answer. Sent a text message: Good morning Alexandria Proctor from [facility] and we are trying to reach out to you because we would like to see you in person if its possible instead of virtual. Please reply or call back at [phone]. Thank you!

## 2019-10-07 NOTE — Telephone Encounter (Signed)
Tried contacting pt twice, On phone number we have on file and phone number of [phone] found on pocketdoc. Per Dyanne Iha, PA she would rather see Alexandria Proctor in office due to chest pains and her age. Thank you.

## 2019-10-17 ENCOUNTER — Other Ambulatory Visit (INDEPENDENT_AMBULATORY_CARE_PROVIDER_SITE_OTHER): Payer: Self-pay

## 2019-10-17 MED ORDER — METHOCARBAMOL 500 MG OR TABS
750.0000 mg | ORAL_TABLET | Freq: Four times a day (QID) | ORAL | 0 refills | Status: DC | PRN
Start: 2019-10-17 — End: 2019-10-19

## 2019-10-17 NOTE — Telephone Encounter (Signed)
Patient made appt for tomorrow in office.    COVID-19 Assessment and Triage      Please note:  High risk category no longer required for testing.  Please continue to document which category they are in for risk stratification of results.    Use as part of cough, fever, URI or shortness of breath triage:  Do you have a fever OR a new cough or shortness of breath (SOB) NO  or  Other symptoms seen in COVID 19 patients include NEW headache, chills with or without repeated shaking, chest tightness, anosmia (loss of smell), ageuisa (loss of taste), sore throat, diarrhea, severe muscle pain, and unexplained fatigue or syncope (fainting).  No  Scratchy throat or a runny nose may also be symptoms, but when present in isolation would not prompt testing.        Does patient belong to one of the following categories (not required for testing)?  [x] High risk includes age>65, active smoker, chronic lung disease, chronic heart/liver/kidney disease, diabetes, immunosuppression, active cancer   [] Any resident of or provider working in a senior living facility, including skilled nursing facilities or assisted living facilities.  [] Health care worker or first responder or frontline worker like delivery or grocery  [] Persons who care for the elderly.  [] Persons experiencing homelessness.  [] Any travel in the last 14 days?    [] Are you waiting on results for a COVID-19 test?  [] Have you been exposed to Corona Virus in the last 14 days or had contact to known COVID-19 cases?  [] Close contact with anyone in above categories?   Pregnant?   NO  If NO to symptoms then schedule as usual.    IF YES to symptoms call physician or clinic for additional instructions.

## 2019-10-17 NOTE — Telephone Encounter (Signed)
Patient called again and is requesting an antibiotic and tylenol for her tooth.

## 2019-10-17 NOTE — Telephone Encounter (Signed)
[facility] Refill Request     Previous encounter created for same issue?  No    Has patient attempted to request refill from pharmacy?: Yes (No refills left)    Medication pended: Yes     Pharmacy verified: Yes    Medication last prescribed by Bhlarhu . This provider is next in the office on 10/17/2019.    Is patient requesting an urgent prescription?: Yes (Marked high priority and patient informed that [facility] will make every effort to address within 24 business hours)    Does your pharmacy usually notify you when prescriptions are ready?: Yes, and patient recommended to wait for pharmacy notification.    No future appointments.    Diamond Nickel

## 2019-10-17 NOTE — Telephone Encounter (Signed)
LMTCB to inform pt of below.

## 2019-10-17 NOTE — Telephone Encounter (Signed)
Lyrica is a controlled substance and requires a visit.  Additionally, tooh infections require OV for evaluation and determination for need of antibiotics.  Ideally, she should see a dentist for all tooth infections.

## 2019-10-17 NOTE — Telephone Encounter (Signed)
Patient is having a tooth infection and states she has been prescribed a medication for pain before by Dr.Bhalrhu.Would like to know if she can receive it again.  Please advise  Patient informed me she takes Lyrica 3 times a day now.

## 2019-10-18 ENCOUNTER — Ambulatory Visit (INDEPENDENT_AMBULATORY_CARE_PROVIDER_SITE_OTHER): Admitting: Physician Assistant

## 2019-10-18 ENCOUNTER — Telehealth (INDEPENDENT_AMBULATORY_CARE_PROVIDER_SITE_OTHER): Payer: Self-pay | Admitting: Physician Assistant
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
## 2019-10-18 NOTE — Telephone Encounter (Signed)
Spoke to pt, scheduled for tomorrow.

## 2019-10-18 NOTE — Progress Notes (Deleted)
Subjective:  Alexandria Proctor is a 64 year old female who is here for No chief complaint on file.      Patient ROS:  General negative for fever  HEENT negative for headache  Cardiac negative for chest pain  Respiratory negative for Shortness of breath   GI negative for abdominal pain   Skin Negative for rash       Current Outpatient Medications   Medication Sig Dispense Refill   . aluminum-magnesium-simethicone (MAG-AL PLUS) 200-200-20 MG/5ML suspension Take 30 mL by mouth every 6 hours as needed for Heartburn (abdominal pain). 355 mL 0   . atorvastatin (LIPITOR) 40 MG tablet Take 1 tablet (40 mg) by mouth every evening. 14 tablet 1   . cloNIDine (CATAPRES) 0.2 MG/24HR patch      . lansoprazole (PREVACID) 30 MG capsule Take 1 capsule (30 mg) by mouth daily. 90 capsule 0   . LYRICA 200 MG capsule TAKE 1 CAPSULE (200 MG) BY MOUTH 2 TIMES DAILY. 180 capsule 1   . methocarbamol (ROBAXIN) 500 MG tablet Take 1.5 tablets (750 mg) by mouth 4 times daily as needed (muscle spasm). 120 tablet 0     No current facility-administered medications for this visit.      Allergies   Allergen Reactions   . Amitriptyline Anaphylaxis   . Compazine Anaphylaxis     Per patient tolerates promethazine/Phenergan   . Gabapentin Hives   . Haldol [Haloperidol] Swelling   . Ondansetron Swelling   . Reglan [Metoclopramide] Anaphylaxis   . Ultram [Tramadol] Swelling   . Zofran [Ondansetron] Anaphylaxis   . Toradol Other     "my throat closes up and tongue swells up"       Reviewed pts pertinent information related to social history, past medical history, past surgical and family history.    Objective:  Vital signs: There were no vitals taken for this visit.    Physical Exam    Assessment / Plan:   Alexandria Proctor was seen today for No chief complaint on file.      Diagnosed with:  No diagnosis found.  No orders of the defined types were placed in this encounter.

## 2019-10-19 ENCOUNTER — Encounter (INDEPENDENT_AMBULATORY_CARE_PROVIDER_SITE_OTHER): Payer: Self-pay | Admitting: Physician Assistant

## 2019-10-19 ENCOUNTER — Ambulatory Visit (INDEPENDENT_AMBULATORY_CARE_PROVIDER_SITE_OTHER): Admitting: Physician Assistant

## 2019-10-19 MED ORDER — FAMOTIDINE 20 MG OR TABS
20.0000 mg | ORAL_TABLET | Freq: Two times a day (BID) | ORAL | 0 refills | Status: AC
Start: 2019-10-19 — End: 2019-11-18

## 2019-10-19 MED ORDER — LYRICA 200 MG OR CAPS
200.00 mg | ORAL_CAPSULE | Freq: Three times a day (TID) | ORAL | 0 refills | Status: AC
Start: 2019-10-19 — End: 2020-01-17

## 2019-10-19 MED ORDER — ACETAMINOPHEN-CODEINE #3 300-30 MG OR TABS (CUSTOM)
1.0000 | ORAL_TABLET | ORAL | 0 refills | Status: AC | PRN
Start: 2019-10-19 — End: 2019-10-22

## 2019-10-19 MED ORDER — AMOXICILLIN-POT CLAVULANATE 875-125 MG OR TABS
1.0000 | ORAL_TABLET | Freq: Two times a day (BID) | ORAL | 0 refills | Status: AC
Start: 2019-10-19 — End: 2019-10-29

## 2019-10-19 MED ORDER — METHOCARBAMOL 750 MG OR TABS
750.0000 mg | ORAL_TABLET | Freq: Four times a day (QID) | ORAL | 0 refills | Status: AC
Start: 2019-10-19 — End: 2020-01-17
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
## 2019-10-19 NOTE — Patient Instructions (Signed)
Referral placed for shingles vaccine    Go to [facility] to complete FASTING labs / water and meds are ok    Make appt with dentist regarding gum infection

## 2019-10-31 ENCOUNTER — Encounter (INDEPENDENT_AMBULATORY_CARE_PROVIDER_SITE_OTHER): Payer: Self-pay | Admitting: Hospital

## 2019-10-31 ENCOUNTER — Encounter (INDEPENDENT_AMBULATORY_CARE_PROVIDER_SITE_OTHER): Payer: Self-pay

## 2019-11-02 ENCOUNTER — Encounter (INDEPENDENT_AMBULATORY_CARE_PROVIDER_SITE_OTHER): Payer: Self-pay

## 2019-11-08 ENCOUNTER — Encounter (INDEPENDENT_AMBULATORY_CARE_PROVIDER_SITE_OTHER): Payer: Self-pay | Admitting: Hospital

## 2019-12-30 IMAGING — CR DX Chest PA Lateral
2 series · 2 of 2 positions shown · non-contrast
Comparison: 05/05/2018

CLINICAL INFORMATION: Chest Pain.

[PA]
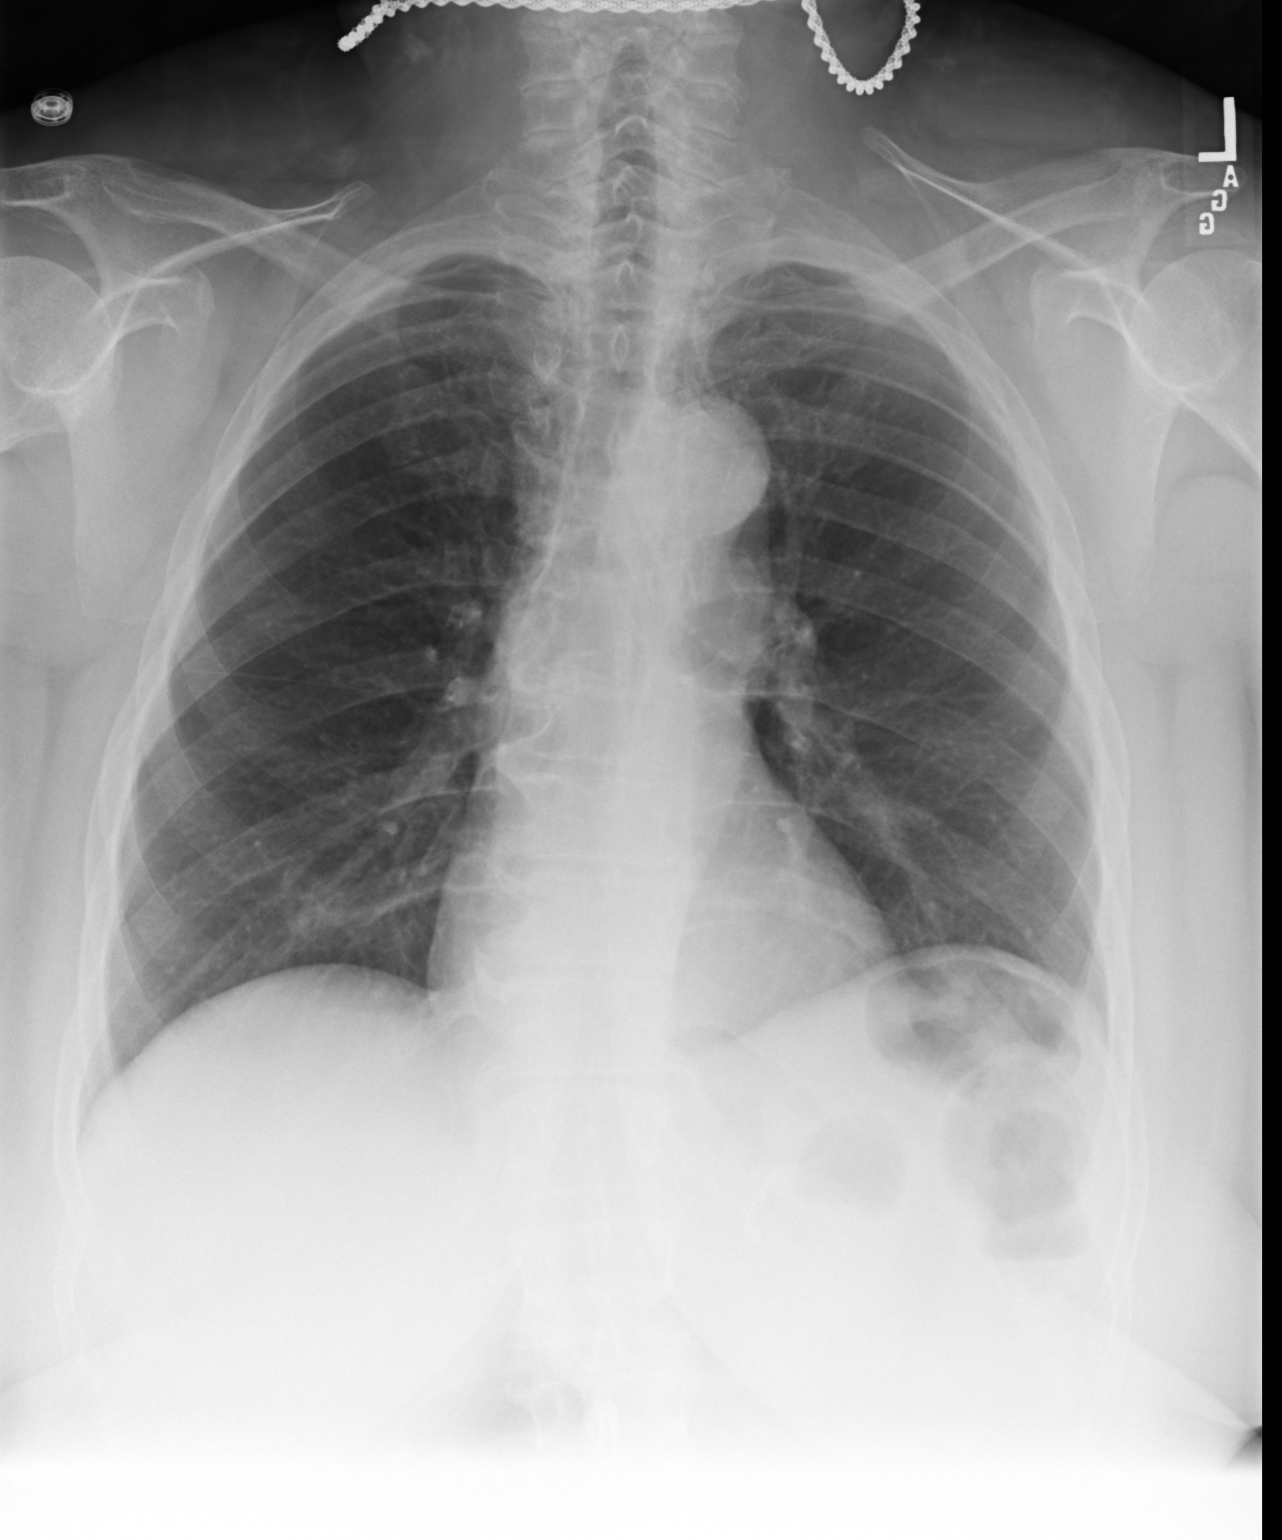

[lateral]
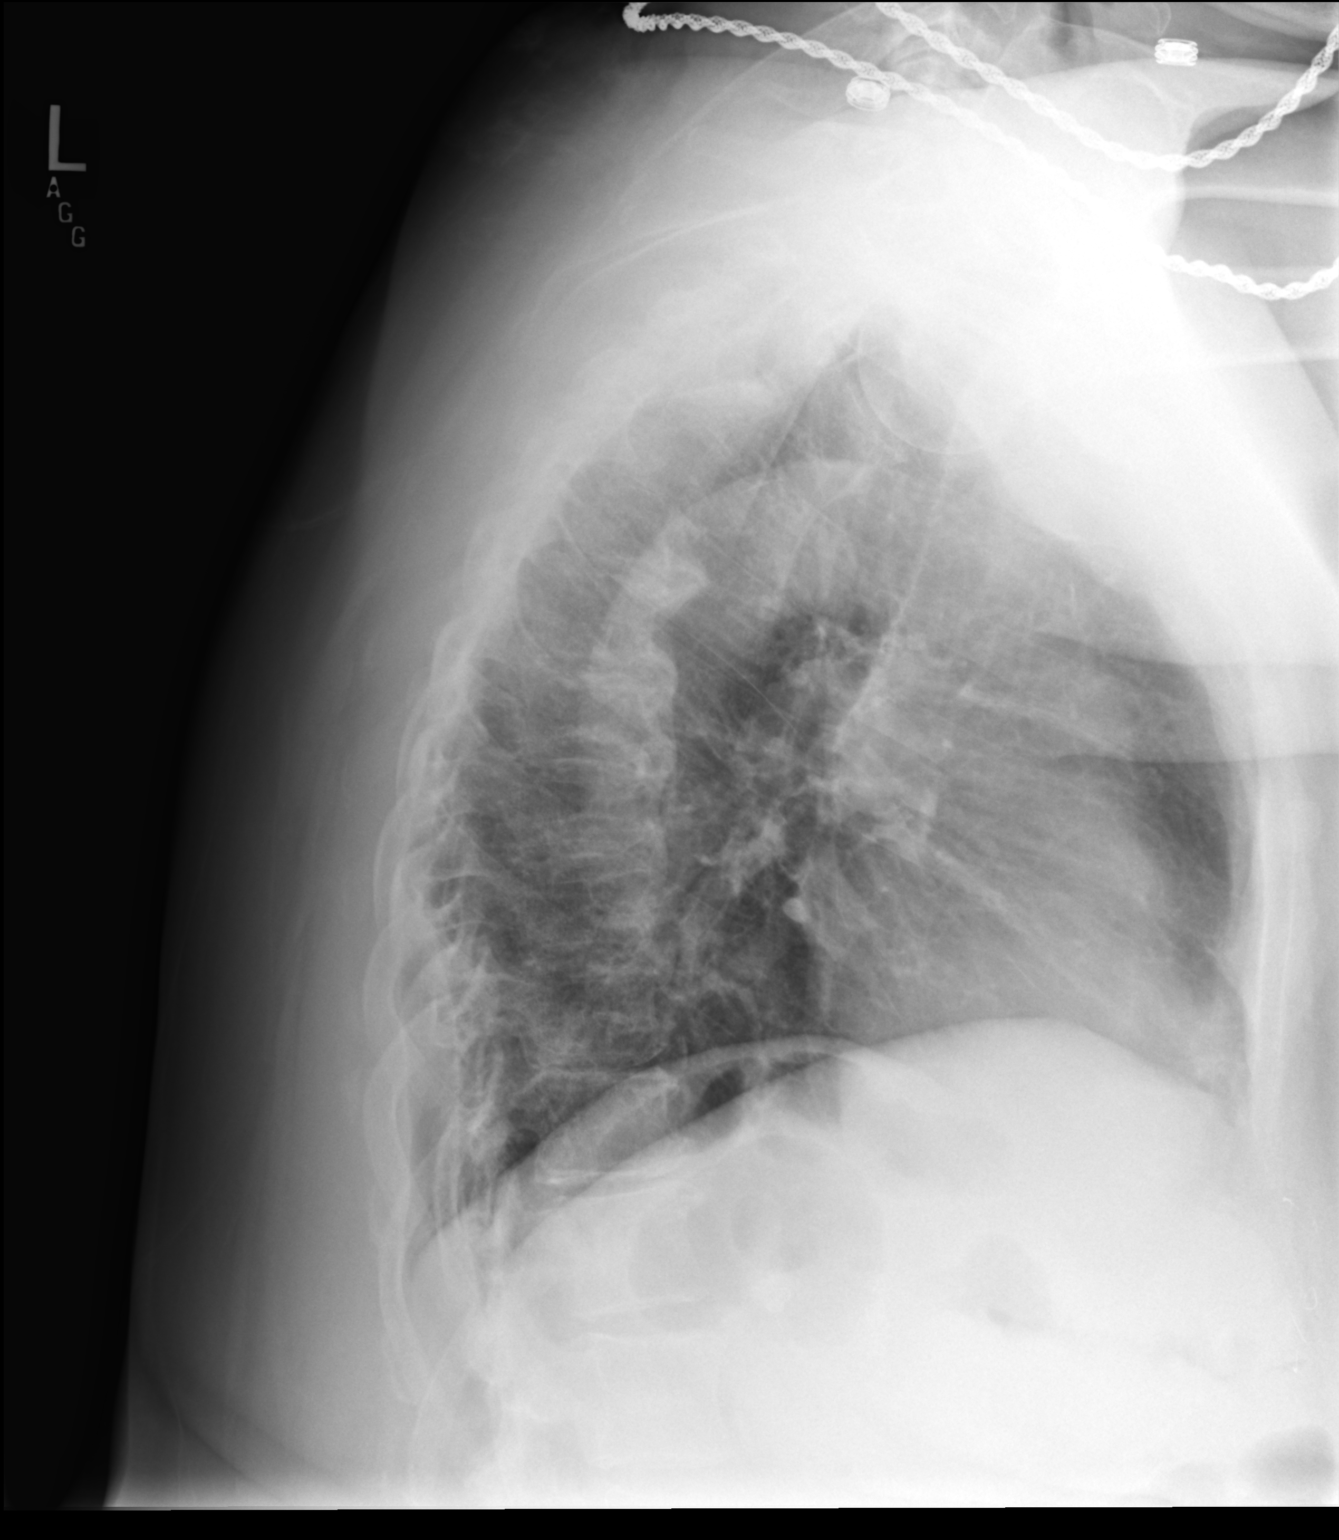

[2 of 2 positions shown; findings below may reference images not displayed]

FINDINGS: The lungs are clear, and there is no pleural effusion or pneumothorax.                    
 The                                                                                       
 cardiomediastinal silhouette is normal. Degenerative changes of the spine                 
 noted.
IMPRESSION: No acute pulmonary abnormality.

## 2020-03-05 ENCOUNTER — Encounter (HOSPITAL_BASED_OUTPATIENT_CLINIC_OR_DEPARTMENT_OTHER): Payer: Self-pay

## 2020-03-05 ENCOUNTER — Encounter (HOSPITAL_BASED_OUTPATIENT_CLINIC_OR_DEPARTMENT_OTHER): Payer: Self-pay | Admitting: Anesthesiology

## 2020-03-05 ENCOUNTER — Ambulatory Visit: Payer: Medicare Other | Attending: Anesthesiology | Admitting: Anesthesiology

## 2020-03-05 VITALS — BP 105/76 | HR 73 | Temp 97.7°F

## 2020-03-05 DIAGNOSIS — M797 Fibromyalgia: Secondary | ICD-10-CM | POA: Insufficient documentation

## 2020-03-05 MED ORDER — METHOCARBAMOL 750 MG OR TABS: 500.0000 mg | ORAL_TABLET | Freq: Three times a day (TID) | ORAL | Status: AC

## 2020-03-05 MED ORDER — ARIPIPRAZOLE 15 MG OR TABS
15.0000 mg | ORAL_TABLET | Freq: Every day | ORAL | Status: AC
Start: 2020-02-15 — End: ?

## 2020-03-05 MED ORDER — HYDROCODONE-ACETAMINOPHEN 5-325 MG OR TABS
1.0000 | ORAL_TABLET | ORAL | Status: DC | PRN
Start: 2020-02-17 — End: 2020-09-26

## 2020-03-05 MED ORDER — BUPROPION XL (DAILY) 300 MG OR TB24: 300.0000 mg | ORAL_TABLET | Freq: Every day | ORAL | Status: AC

## 2020-03-05 MED ORDER — PREGABALIN 200 MG OR CAPS: 200.0000 mg | ORAL_CAPSULE | Freq: Three times a day (TID) | ORAL | Status: AC

## 2020-03-05 NOTE — Interdisciplinary (Signed)
Pt had flu shot
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
## 2020-03-05 NOTE — Patient Instructions (Signed)
Patient to Contact Dr. Sexton for a Medical Marijuana Consultation    Michelle Sexton, ND   Assistant Adjunct Professor   Department of Anesthesiology   Voluntary Clinical Instructor   Department of Family Medicine and Public Health     Clinical Practice: [facility]   [address]   [place], [place] [address]   Phone: [phone]   Fax: [phone]

## 2020-03-05 NOTE — Progress Notes (Signed)
Attending Note:     Subjective:   I reviewed the history.   Patient interviewed and examined.   This is a 64 year old female who has a past medical history of Amphetamine abuse (CMS-HCC), Aortic aneurysm (CMS-HCC), Bipolar 1 disorder (CMS-HCC), Bipolar affective (CMS-HCC), COPD (chronic obstructive pulmonary disease) (CMS-HCC), Fibromyalgia, Fracture, intertrochanteric, left femur (CMS-HCC), Gastric bypass status for obesity, H/O: hysterectomy, HLD (hyperlipidemia), HTN (hypertension), Manic depression (CMS-HCC), Obesity, Opioid abuse (CMS-HCC), Polyarthropathy or polyarthritis of multiple sites, and Schizophrenia (CMS-HCC). she is here today for Low Back Pain (new, severe,progression), Abdominal Pain (new, severe,progression), Leg Pain (new, severe,progression), and Foot Pain (bilateral, new, severe,progression)     This is a new consultation.  Objective:   I have examined the patient and I concur with the fellow physician/resident physician/medical student exam as documented.     Assessment and plan reviewed with the fellow physician/resident physician/medical student.   I agree with the fellow physician/resident physician/medical student as documented.     Today reviewed notes from prior visit(s) with [facility], reviewed note(s) from primary care, reviewed note(s) from ER and reviewed hip x-ray, knee x-ray, shoulder x-ray, femur, tibia x-ray report     Patient has widespread pain.  Reports that injections tried in the past worsened her pain.  Has been on high dose opioids.  Last prescription from her PCP for hydrocodone was  #45 on 5/28.  She has been out for the past 7 days.  I do not recommend continuing opioids.  Review of her CURES shows excessive use.  She is not experiencing any withdrawal sx.  Recommend continuing the lyrica.  I have given her the contact of Dr. Henderson Newcomer to discuss medical cannabis dosing which she is interested in trying.        Patient Instructions   Patient to Contact  Dr. Henderson Newcomer for a Medical Marijuana Consultation    Lawrence Santiago, ND   Assistant Adjunct Professor   Department of Anesthesiology   Voluntary Clinical Instructor   Department of Family Medicine and Public Health     Clinical Practice: [facility]   [address]   [place], [place] [address]   Phone: [phone]   Fax: [phone]          I have examined the patient, discussed the findings, reviewed the plan, and answered all questions with the patient.  See the fellow physician/resident physician/medical student note for further details.    Total duration of encounter spent in pre-visit (reviewing Care Everywhere, reviewing CURES and reviewing images), intra-visit (performing physical exam, creating a treatment plan and medical discussion with patient), and post-visit (note completion and coordination of care) on the day of the encounter, excluding separately reportable services/procedures: 45 minutes.

## 2020-03-06 ENCOUNTER — Encounter (INDEPENDENT_AMBULATORY_CARE_PROVIDER_SITE_OTHER): Payer: Self-pay | Admitting: Emergency Medical Services

## 2020-03-06 DIAGNOSIS — R1011 Right upper quadrant pain: Secondary | ICD-10-CM

## 2020-06-30 NOTE — Progress Notes (Signed)
 This encounter was opened in error.  Please disregard.

## 2020-08-23 ENCOUNTER — Encounter (INDEPENDENT_AMBULATORY_CARE_PROVIDER_SITE_OTHER): Admitting: Family Medicine

## 2020-08-30 ENCOUNTER — Telehealth (INDEPENDENT_AMBULATORY_CARE_PROVIDER_SITE_OTHER): Payer: Self-pay | Admitting: Family Medicine

## 2020-08-30 ENCOUNTER — Ambulatory Visit (INDEPENDENT_AMBULATORY_CARE_PROVIDER_SITE_OTHER): Admitting: Physician Assistant

## 2020-08-30 ENCOUNTER — Encounter (INDEPENDENT_AMBULATORY_CARE_PROVIDER_SITE_OTHER): Admitting: Family Medicine

## 2020-08-30 NOTE — Telephone Encounter (Signed)
 Pt came in office to do to a visit that was previously scheduled with Dr Jarrell Merritts but was rescheduled by MA. Was checking in pt and ran insurance and it was with different PCP and Brien Can. I let her know that we are not in network and confirmed with billing if it might of updated in website. Per Diane it hadn't changed and I let pt know that she needs to call her insurance and handed me [employer]. I told her we are not in network but that she can change [employer] pcp and medical group. She then started to go off that she had been waiting outside the rain and should of been advised at the time she was rescheduled and that she has lukemia and that she was bleeding. She then processed to continue yelling and to call 911 to go to the hospital that she needed her rx. Called 911 and was transferred to EMT and provided description of patient and that they were on their way.

## 2020-08-30 NOTE — Telephone Encounter (Addendum)
 Patient came back to clinic saying she needed to come in because she was on the phone with our corporate office and they wanted to speak with me. Patient gave me the phone and I spoke with representative. She stated that she was speaking to someone in call center and they were saying pt can be seen with insurance she had. Informed her that when we check what insurance we are in network [place] comes out because we accept it through medicare and not medical. Patient was adamant stating she was already removed from [facility] and that she only had [place]. Informed pt once again that the [facility] had to be through medicare. Representative then stated she would check on the state website to see what insurance pt had. Was on hold for few minutes , she then stated that she checked and patient was still showing insurance under [employer] with [facility]. Patient then began stating if she can jujst have the Dr. Tresa Res her a one week supply until she fixed insurance.Asked provider for advise. Informed pt that unfortunately she hadn't been seen in over 6 months and provider has never given rx so she would need to see provider. Patient stated she understood and walked out clinic.

## 2020-08-30 NOTE — Telephone Encounter (Signed)
 Received a call from Jacqueline Egana. Wanted to clarify Pt's insurance. Looked over pt's accnt and informed her that Pt has [employer] with Pt's with Mima Alstrom which we are not in network with that medical group and we are not the PCP. If Pt would still like to be seen with us , she would have to change her medical group to the ones that we are in network with like [facility] for example and we have to be the PCP.

## 2020-08-30 NOTE — Telephone Encounter (Addendum)
.   Briana from [facility] informed me that pt had [employer] as a primary insurance and that she should have been able to be seen .I was instructed to create a TE documenting what I was told from [place] from [place]

## 2020-08-30 NOTE — Telephone Encounter (Signed)
error 

## 2020-08-30 NOTE — Telephone Encounter (Signed)
 LMTCB to inform patient that Dr. Jarrell Merritts is not in office today and will need to get rescheduled.

## 2020-08-31 NOTE — Telephone Encounter (Addendum)
I reviewed the call recording between [place] from [place]. 08/30/2020 at 4:15PM    Lesly Rubenstein stated the patient was told one of our staff with a name that sounded like Willette Cluster or Ivette, that the patient can't be seen for her appointment that was scheduled at [facility] because we are out of network with her insurance. Lesly Rubenstein mentioned that the patient has been standing outside the clinic in the rain since 12 in the afternoon and the staff didn't allow the patient into the clinic. After a hold, Lesly Rubenstein stated that she spoke to [place] from [facility] and the patient was instructed last week to change her PCP and medical group with her [employer], which is [employer].

## 2020-09-05 NOTE — Telephone Encounter (Signed)
 Noted.

## 2020-09-26 ENCOUNTER — Encounter (INDEPENDENT_AMBULATORY_CARE_PROVIDER_SITE_OTHER): Payer: Self-pay | Admitting: Internal Medicine

## 2020-09-26 ENCOUNTER — Telehealth (INDEPENDENT_AMBULATORY_CARE_PROVIDER_SITE_OTHER): Payer: Self-pay | Admitting: Physician Assistant

## 2020-09-26 ENCOUNTER — Telehealth (INDEPENDENT_AMBULATORY_CARE_PROVIDER_SITE_OTHER): Admitting: Internal Medicine

## 2020-09-26 DIAGNOSIS — K0889 Other specified disorders of teeth and supporting structures: Secondary | ICD-10-CM

## 2020-09-26 MED ORDER — AMOXICILLIN-POT CLAVULANATE 875-125 MG OR TABS
1.0000 | ORAL_TABLET | Freq: Two times a day (BID) | ORAL | 0 refills | Status: AC
Start: 2020-09-26 — End: ?

## 2020-09-26 MED ORDER — HYDROCODONE-ACETAMINOPHEN 5-325 MG OR TABS
1.0000 | ORAL_TABLET | Freq: Four times a day (QID) | ORAL | 0 refills | Status: AC | PRN
Start: 2020-09-26 — End: ?

## 2020-09-26 MED ORDER — NALOXONE HCL 4 MG/0.1ML NA LIQD
NASAL | 3 refills | Status: AC
Start: 2020-09-26 — End: ?

## 2020-09-26 NOTE — Telephone Encounter (Signed)
 Filling norco #10 tablets for Dr. Jagasia.     Cures reviewed.

## 2020-09-26 NOTE — Progress Notes (Signed)
 [facility] Video Visit    Interactive real time audio and visual communication used for the telehealth visit.   HPI   Alexandria Proctor is a 65 year old female with who presents to clinic for tooth pain.    She reports she has had tooth pain for one week.  Her right upper tooth is cracked.  She reports having severe pain and subjective fevers.  She states she has tried tylenol without benefit.  She is unable to take NSAIDS due to gastritis and reports she is allergic to tramadol.  She is also requesting a refill of her lyrica.      REVIEW OF SYSTEMS    Negative except for those in HPI      PAST MEDICAL AND SURGICAL HISTORY:    has a past surgical history that includes GASTRIC BYPASS; Ventral Hernia repair; Stomach surgery; s/p IMN left IT fracture 07/20/16; hx gastric bypass; Gastric bypass; and Total knee arthroplasty (Right, 2016).  Past Medical History:   Diagnosis Date   . Amphetamine abuse (CMS-HCC)    . Aortic aneurysm (CMS-HCC)    . Bipolar 1 disorder (CMS-HCC)    . Bipolar affective (CMS-HCC)    . COPD (chronic obstructive pulmonary disease) (CMS-HCC)    . Fibromyalgia    . Fracture, intertrochanteric, left femur (CMS-HCC)    . Gastric bypass status for obesity    . H/O: hysterectomy    . HLD (hyperlipidemia)    . HTN (hypertension)    . Manic depression (CMS-HCC)    . Obesity    . Opioid abuse (CMS-HCC)    . Polyarthropathy or polyarthritis of multiple sites    . Schizophrenia (CMS-HCC)         CURRENT MEDICATIONS:    Current Outpatient Medications:   .  amoxicillin-clavulanate (AUGMENTIN) 875-125 MG tablet, Take 1 tablet by mouth 2 times daily., Disp: 14 tablet, Rfl: 0  .  ARIPiprazole (ABILIFY) 15 MG tablet, Take 15 mg by mouth daily., Disp: , Rfl:   .  atorvastatin (LIPITOR) 40 MG tablet, Take 1 tablet (40 mg) by mouth every evening., Disp: 14 tablet, Rfl: 1  .  buPROPion (WELLBUTRIN) 300 MG Extended-Release tablet, Take 300 mg by mouth daily., Disp: , Rfl:   .  cloNIDine (CATAPRES) 0.2 MG/24HR patch,  , Disp: , Rfl:   .  famotidine (PEPCID) 20 MG tablet, Take 1 tablet (20 mg) by mouth 2 times daily., Disp: 60 tablet, Rfl: 0  .  HYDROcodone-acetaminophen (NORCO) 5-325 MG tablet, Take 1 tablet by mouth every 6 hours as needed for Moderate Pain (Pain Score 4-6)., Disp: 10 tablet, Rfl: 0  .  LYRICA 200 MG capsule, Take 1 capsule (200 mg) by mouth 3 times daily., Disp: 270 capsule, Rfl: 0  .  methocarbamol (ROBAXIN) 750 MG tablet, Take 500 mg by mouth 3 times daily., Disp: , Rfl:   .  naloxone (NARCAN) 4 mg/0.1 mL nasal spray, For suspected opioid overdose, spray once in one nostril. Repeat after 3 minutes if no or minimal response., Disp: 2 each, Rfl: 3  .  pregabalin (LYRICA) 200 MG capsule, Take 200 mg by mouth 3 times daily., Disp: , Rfl:      ALLERGIES:  Amitriptyline, Compazine, Gabapentin, Haldol [haloperidol], Ondansetron, Reglan [metoclopramide], Ultram [tramadol], Zofran [ondansetron], and Toradol    FAMILY HISTORY:  family history includes Hypertension in her mother; Schizophrenia in her mother.    SOCIAL HISTORY:    reports that she has been smoking cigarettes. She  has been smoking about 1.00 pack per day. She has never used smokeless tobacco. She reports previous alcohol use. She reports previous drug use.     PHYSICAL EXAMINATION    There were no vitals filed for this visit.  There is no height or weight on file to calculate BMI.  Wt Readings from Last 5 Encounters:   10/19/19 94.4 kg (208 lb 3.2 oz)   09/22/19 96.3 kg (212 lb 6.4 oz)   07/12/19 101.2 kg (223 lb 1.7 oz)   11/23/18 101.2 kg (223 lb)   04/06/18 86.2 kg (190 lb)     Blood Pressure   03/05/20 105/76   10/19/19 140/80   09/22/19 124/88   07/14/19 142/82   07/07/19 125/70       Studies:  Labs  Results for orders placed or performed during the hospital encounter of 07/11/19   CBC w/ Diff Lavender   Result Value Ref Range    WBC 5.7 4.0 - 10.0 1000/mm3    RBC 4.56 3.90 - 5.20 mill/mm3    Hgb 12.0 11.2 - 15.7 gm/dL    Hct 47.8 29.5 - 62.1 %     MCV 82.7 79.0 - 95.0 um3    MCH 26.3 26.0 - 32.0 pgm    MCHC 31.8 (L) 32.0 - 36.0 g/dL    RDW 30.8 (H) 65.7 - 14.0 %    MPV 9.4 9.4 - 12.4 fL    Plt Count 257 140 - 370 1000/mm3    Segs 62 %    Lymphocytes 27 %    Monocytes 7 %    Eosinophils 4 %    Basophils 0 %    ANC-Automated 3.5 1.6 - 7.0 1000/mm3    Abs Lymphs 1.5 0.8 - 3.1 1000/mm3    Abs Monos 0.4 0.2 - 0.8 1000/mm3    Abs Eosinophils 0.2 0.0 - 0.5 1000/mm3    Abs Basophils 0.0 0.0 1000/mm3    Diff Type Automated    Comprehensive Metabolic Panel Green   Result Value Ref Range    Glucose 81 70 - 99 mg/dL    BUN 14 8 - 23 mg/dL    Creatinine 8.46 (H) 0.51 - 0.95 mg/dL    GFR >96 mL/min    Sodium 141 136 - 145 mmol/L    Potassium 3.9 3.5 - 5.1 mmol/L    Chloride 102 98 - 107 mmol/L    Bicarbonate 30 (H) 22 - 29 mmol/L    Anion Gap 9 7 - 15 mmol/L    Calcium 9.0 8.5 - 10.6 mg/dL    Total Protein 6.4 6.0 - 8.0 g/dL    Albumin 4.0 3.5 - 5.2 g/dL    Bilirubin, Tot 2.95 <1.2 mg/dL    AST (SGOT) 21 0 - 32 U/L    ALT (SGPT) 20 0 - 33 U/L    Alkaline Phos 87 35 - 140 U/L   Lipase, Blood Green Plasma Separator Tube   Result Value Ref Range    Lipase 16 13 - 60 U/L   Urinalysis with Culture Reflex, when indicated    Specimen: Clean Catch; Urine   Result Value Ref Range    Type Clean catch     Color Light-Yellow Yellow    Appearance Clear Clear    Specific Gravity 1.024 1.002 - 1.030    pH 6.5 5.0 - 8.0    Protein Negative Negative    Glucose Negative Negative    Ketones Negative Negative    Bilirubin  Negative Negative    Blood Negative Negative    Urobilinogen Negative Negative    Nitrite Negative Negative    Leuk Esterase 25 (A) Negative Leu/uL    WBC 0-2 0-2/HPF    RBC 0-2 0-2/HPF    Bacteria None None-Rare/HPF    Squam. Epithelial Cell 0-5(RARE) <6-10(FEW)   Lactate, Blood - See Instructions   Result Value Ref Range    Lactate 1.2 0.5 - 2.0 mmol/L   Urine Immunoassay Drug Screen   Result Value Ref Range    Amphetamines Screen Negative Negative    Barbiturates Screen  Negative Negative    Cocaine Screen Negative Negative    Benzodiazepine Screen Negative Negative    Methadone Screen Negative Negative    Opiates Screen Pending confirm Negative    Oxycodone Screen Negative Negative    Phencyclidine Screen Negative Negative    THC Screen Negative Negative    UR Drug Screen Interpretation See Comment Negative   Basic Metabolic Panel, Blood Green Plasma Separator Tube   Result Value Ref Range    Glucose 81 70 - 99 mg/dL    BUN 8 8 - 23 mg/dL    Creatinine 1.61 0.96 - 0.95 mg/dL    GFR >04 mL/min    Sodium 144 136 - 145 mmol/L    Potassium 4.0 3.5 - 5.1 mmol/L    Chloride 109 (H) 98 - 107 mmol/L    Bicarbonate 29 22 - 29 mmol/L    Anion Gap 6 (L) 7 - 15 mmol/L    Calcium 8.7 8.5 - 10.6 mg/dL   Liver Panel, Blood Green Plasma Separator Tube   Result Value Ref Range    Total Protein 5.6 (L) 6.0 - 8.0 g/dL    Albumin 3.5 3.5 - 5.2 g/dL    Bilirubin, Dir <5.4 <0.2 mg/dL    Bilirubin, Tot 0.98 <1.2 mg/dL    AST (SGOT) 22 0 - 32 U/L    ALT (SGPT) 21 0 - 33 U/L    Alkaline Phos 71 35 - 140 U/L   CBC w/ Diff Lavender   Result Value Ref Range    WBC 5.5 4.0 - 10.0 1000/mm3    RBC 4.38 3.90 - 5.20 mill/mm3    Hgb 11.7 11.2 - 15.7 gm/dL    Hct 11.9 14.7 - 82.9 %    MCV 82.2 79.0 - 95.0 um3    MCH 26.7 26.0 - 32.0 pgm    MCHC 32.5 32.0 - 36.0 g/dL    RDW 56.2 (H) 13.0 - 14.0 %    MPV 9.1 (L) 9.4 - 12.4 fL    Plt Count 247 140 - 370 1000/mm3    Segs 69 %    Lymphocytes 18 %    Monocytes 9 %    Eosinophils 4 %    Basophils 0 %    ANC-Automated 3.8 1.6 - 7.0 1000/mm3    Abs Lymphs 1.0 0.8 - 3.1 1000/mm3    Abs Monos 0.5 0.2 - 0.8 1000/mm3    Abs Eosinophils 0.2 0.0 - 0.5 1000/mm3    Abs Basophils 0.0 0.0 1000/mm3    Diff Type Automated    Confirm Opiates, Urine   Result Value Ref Range       Conf. 6 Acetylmorphine Negative 0 - 9 ng/mL       Conf. Codeine Pos, >10000 (A) 0 - 99 ng/mL       Conf. EDDP Negative 0 - 99 ng/mL       Conf.  Fentanyl Negative 0 - 4 ng/mL       Conf. Hydrocodone Negative 0 -  99 ng/mL       Conf. Hydromorphone Negative 0 - 99 ng/mL       Conf. Methadone Negative 0 - 99 ng/mL       Conf. Morphine Pos, 2871 (A) 0 - 99 ng/mL        Conf. Norcodeine Pos, 1396 (A) 0 - 99 ng/mL       Conf. Norfentanyl Negative 0 - 4 ng/mL       Conf. Norhydrocodone Negative 0 - 99 ng/mL        Conf. Noroxycodone Negative 0 - 49 ng/mL       Conf. Oxycodone Negative 0 - 49 ng/mL       Conf. Oxymorphone Negative 0 - 49 ng/mL       Conf. Opiates Interp. See comment    COVID-19 Coronavirus Detection Assay at [facility]   Result Value Ref Range    COVID-19 Source Nasal     COVID-19 Coronavirus Result Not Detected    Urine Culture Urine    Specimen: Urine   Result Value Ref Range    Urine Culture Result Mixed Urogenital Flora    MRSA Surveillance Culture [place] eSwab Nares    Specimen: Nares    NARES   Result Value Ref Range    MRSA Surveillance Culture (A)      Methicillin resistant Staphylococcus aureus  present         Imaging  No results found.    GEN: in NAD, A&O, pleasant, non-toxic appearing, right upper tooth cracked  EENT: No injection or icterus.   Psych: Alert and Oriented. Affect appropriate.       ASSESSMENT AND PLAN    1. Tooth pain  - follow up with dentist  - prescribed noco, 10 tablets  - augmentin ordered for 7 days    2. Fibromyalgia  - lyrica renewed        FOLLOW UP   Return if symptoms worsen or fail to improve.    Stephens Shire, MD

## 2020-11-03 ENCOUNTER — Telehealth (INDEPENDENT_AMBULATORY_CARE_PROVIDER_SITE_OTHER): Payer: Self-pay | Admitting: Family Practice

## 2020-11-03 ENCOUNTER — Telehealth (INDEPENDENT_AMBULATORY_CARE_PROVIDER_SITE_OTHER): Payer: Self-pay

## 2020-11-03 ENCOUNTER — Telehealth (INDEPENDENT_AMBULATORY_CARE_PROVIDER_SITE_OTHER): Payer: Self-pay | Admitting: Physician Assistant

## 2020-11-03 ENCOUNTER — Ambulatory Visit (INDEPENDENT_AMBULATORY_CARE_PROVIDER_SITE_OTHER): Admitting: Family Practice

## 2020-11-03 NOTE — Telephone Encounter (Addendum)
 Called patient to discuss today's appt. as the provider she is seeing is unable to prescribe her pain meds or Norcos at this time. Provider recommends that the patient go to the ER for X-rays and Meds if she feels they are needed.

## 2020-11-03 NOTE — Telephone Encounter (Signed)
COVID-19 Assessment and Triage      Please note:  High risk category no longer required for testing.  Please continue to document which category they are in for risk stratification of results.    Use as part of cough, fever, URI or shortness of breath triage:  Do you have a fever OR a new cough or shortness of breath (SOB) no  or  Other symptoms seen in COVID 19 patients include NEW headache, chills with or without repeated shaking, chest tightness, anosmia (loss of smell), ageuisa (loss of taste), sore throat, diarrhea, severe muscle pain, and unexplained fatigue or syncope (fainting). no  Scratchy throat or a runny nose may also be symptoms, but when present in isolation would not prompt testing.        Does patient belong to one of the following categories (not required for testing)?  []High risk includes age>65, active smoker, chronic lung disease, chronic heart/liver/kidney disease, diabetes, immunosuppression, active cancer   []Any resident of or provider working in a senior living facility, including skilled nursing facilities or assisted living facilities.  []Health care worker or first responder or frontline worker like delivery or grocery  []Persons who care for the elderly.  []Persons experiencing homelessness.  []Any travel in the last 14 days?    []Are you waiting on results for a COVID-19 test?  []Have you been exposed to Corona Virus in the last 14 days or had contact to known COVID-19 cases?  []Close contact with anyone in above categories?   Pregnant?    No    If NO to symptoms then schedule as usual.    IF YES to symptoms call physician or clinic for additional instructions.

## 2020-11-03 NOTE — Telephone Encounter (Signed)
 Patient scheduled in clinic for extremity pain related to fall out of wheelchair. Phone call made to patient prior to arrival to notify her it would be best to go to emergency room as there is a high probability for  need of xrays. Was unable to get ahold of patient.     Arrived to clinic and notified that it would be best to be seen in emergency room. Patient demanding she gets seen by provider and prescribed muscle relaxants. Patient became very hostile and aggressive. Used profanity with medical assistants Azalya and Hope Budds.      On 01/05 she was prescribed Norco #10 by providers at our clinic after telephone appointment with complaint of tooth pain.     Has also been followed by [facility] pain management. Last visit 03/05/2020 with the recommendation that she does not receive opioids due to extensive history.     CURES reviewed and I did not feel comfortable prescribing medication considering history of excessive use of opiods.    This is a safety issue for staff as well as patients as she became increasingly aggressive. Patient asked to leave facility multiple times. Spoke with Mitch who notified security. In the meantime, patient eventually left.

## 2020-11-03 NOTE — Telephone Encounter (Signed)
 This was meant for another provider, please disregard.

## 2020-11-04 ENCOUNTER — Telehealth (INDEPENDENT_AMBULATORY_CARE_PROVIDER_SITE_OTHER): Payer: Self-pay | Admitting: Family Practice

## 2020-11-04 NOTE — Telephone Encounter (Signed)
 INCIDENT REPORT FORM    EMPLOYEE DETAILS    Name: Leane Platt  Title: MA  Extension: [id number]    Provider who was notified: NP Casimiro Needle       PATIENT DETAILS    Name: Alexandria Proctor, Alexandria Proctor   DOB: 14-Feb-1956  MRN: [id number]      DESCRIPTION OF INCIDENT    Clinical Site: [facility]  Date: 11/03/20  Time: 12:30-12:45/1:00ish.  Type of Incident: Near Miss Incident: The situation was corrected before potential harm could be done to a person.  Issue Resolved: no  Incident details (Please be as descriptive as possible with your description):  Yesterday afternoon I was informed by NP Joni Reining that I needed to call the patient above and recommend that they go to the ER as the provider did not feel comfortable giving any pain meds and we do not have xrays in house. Patient was already seeing pain management, had 6+ allergies on her chart, and had been prescribed 10 Norcos in January by another provider. NP Casimiro Needle stated she didn't want patient to waste her time coming in if there wasn't anything we could do for her. Patients phone was disconnected so I reached out via alternate phone in which her daughter said she would inform her mom to call us, she never did. After this, patient showed up for her appt. When she arrived, I informed her of what the provider had told me by saying " Hi Ms. Alexandria Proctor, I just wanted to inform you that if you need any xrays or pain medications, the provider you are seeing today would like you to go to the ER" she then said " Im not looking for no pain meds or norcos, im not about taking that stuff" I then responded saying " okay we just wanted to make sure so that you dont waste any of your time" she then said "its not a waste of my time, I fell out my wheelchair and what I want is a muscle relaxer" I said "okay let me go inform her" patient said thank you and then I went to ask. After speaking with the provider, I Informed the patient that we wouldn't be able to do that either and the provider still  recommended she go to the ER. At this point she started getting upset and raising her voice saying that I was treating her as if she was homeless or a drug addict and that it was no way to treat a person" I tried to calm her down and said "no one thinks of you that way, we are just trying to make sure that you are able to get what you need" she then started raising her voice and saying " no, what was the first thing you told me when I came in here? You over talking about you're not not gonna prescribe me pain meds treating me like im some addict. im gonna be making a complaint" I just responded by saying "well I apologize, I didn't mean it that way at all" she said "yes you did" and started going on again about how she was being mistreated. She then said again she was going to make a complaint to which I just said "okay you can do what you feel is necessary"  she responded "okay nothin" and just kept yelling and rambling on about how she isn't a homeless and how we were treating her wrong. This whole time, I had another patient in the lobby so I asked her  to calm down which she did not. I went to the provider room to speak with NP Casimiro Needle about what I should do next, she told me that Marthann Schiller had been informed of the situation and stated he would be calling security for [place]. When I came back out, the patient was yelling at [place] accusing [place] of racism. Hope Budds also mentioned that the patient kept inching towards her in her mechanical cart and farhia had to take steps backwards. I told Hope Budds privately to not engage with the patient, security was coming. Then, the patient had called someone on the phone and started telling them that we were being prejudice and treating her like a homeless drug addict when she lives a good life and has a good home, probably better that what we do. At this point my 12:30 was ready for his Meningococcal B vaccine so, since security was coming, I left the front to administer it.  Im not sure  what else was said on the phone during this time, but when I got back she was telling whoever was on the other side of the line that I had just taken someone back before her when her appointment was at 12:30. She hung up after this and made a comment to me about having taken the other patient back, I told her again "your appointment was not until 1pm, they asked you to come early for paperwork but I do have other patients who are scheduled before you that I need to get back, and the provider would still like you to go to the ER" she started yelling saying that she wanted to talk to the provider. Another patient came in at this time who was lighter in skin tone and had a 12:45 appointment with the other provider we had in office that day, NOT with NP Casimiro Needle. Alexandria Proctor was still yelling so I told her again that she needed to calm down because I had other patients in the lobby as well. When the MA came to take that patient back (which was almost immediately because we didn't want them to have to be in the lobby with her)  Alexandria Proctor started up again saying "why is he going back? yall done took two people back and my appointment was at 12:30. You guys are prejudice as hell." She kept going but I cant remember specifically what she was saying.She was yelling and even started cursing at [place] saying that we were just NP's (were MA's) and didn't know what we were doing. I told her that if she was going to keep raising her voice and cursing, then she would need to exit the clinic. Security still hadn't arrived at this point. She stated that she wasn't going to go anywhere and that she would be calling a Merchandiser, retail. I said "My supervisor is aware of the situation but you are more than welcome to call and speak with him" she stated calling us "little bitches" and cursing at [place] again. At that point, the situation was getting to be very intense so I told her "ma'am I already told you your options. If you aren't going to stop yelling then  I will need you to exit the clinic. Security has been called" she kept cursing, called [place] bitches again, and left.        Incident Causes: Mentioning to patient prior to visit that if she was needing any xrays or pain meds, we would not be able to get them done/ prescribe them at  this time. Mentioning to patient that we could also not prescribe muscle relaxer's as she was asking for some.  Her misunderstanding her appt time, she thought it was at 12:30 even after we told her it was at 1pm. 12:30 was just for paperwork. After this,our 12:45 who was lighter in skin tone was roomed (as their appt time was first and for a different provider than the one Alexandria Proctor was seeing) and she accused [place] of racism.    Follow Up Recommendations: Speak with the MD who prescribed the patient with Norcos via phone in January for a broken tooth/tooth pain. Patient should have been sent to dental or at least required to be seen face to face for Norcos, not just a phone call. Despite Patient saying she didn't want pain meds, she had mentioned the Norcos which gave [place] a red flag. It seems that the Norco Rx over the phone may have opened a door for the patient to expect that she will easily be able to get more Norcos or other medications here at [facility].

## 2020-11-22 ENCOUNTER — Emergency Department (HOSPITAL_BASED_OUTPATIENT_CLINIC_OR_DEPARTMENT_OTHER): Payer: BLUE CROSS/BLUE SHIELD

## 2020-11-22 ENCOUNTER — Emergency Department
Admission: EM | Admit: 2020-11-22 | Discharge: 2020-11-23 | Disposition: A | Discharge status: Home / Self Care | Payer: BLUE CROSS/BLUE SHIELD | Attending: Emergency Medicine | Admitting: Emergency Medicine

## 2020-11-22 DIAGNOSIS — N83202 Unspecified ovarian cyst, left side: Secondary | ICD-10-CM | POA: Insufficient documentation

## 2020-11-22 DIAGNOSIS — R079 Chest pain, unspecified: Secondary | ICD-10-CM

## 2020-11-22 DIAGNOSIS — L0291 Cutaneous abscess, unspecified: Secondary | ICD-10-CM

## 2020-11-22 DIAGNOSIS — R1084 Generalized abdominal pain: Secondary | ICD-10-CM | POA: Insufficient documentation

## 2020-11-22 DIAGNOSIS — K439 Ventral hernia without obstruction or gangrene: Secondary | ICD-10-CM | POA: Insufficient documentation

## 2020-11-22 DIAGNOSIS — Z885 Allergy status to narcotic agent status: Secondary | ICD-10-CM | POA: Insufficient documentation

## 2020-11-22 DIAGNOSIS — Z888 Allergy status to other drugs, medicaments and biological substances status: Secondary | ICD-10-CM | POA: Insufficient documentation

## 2020-11-22 DIAGNOSIS — Z9884 Bariatric surgery status: Secondary | ICD-10-CM | POA: Insufficient documentation

## 2020-11-22 DIAGNOSIS — R112 Nausea with vomiting, unspecified: Secondary | ICD-10-CM | POA: Insufficient documentation

## 2020-11-22 DIAGNOSIS — L02213 Cutaneous abscess of chest wall: Secondary | ICD-10-CM | POA: Insufficient documentation

## 2020-11-22 LAB — COMPREHENSIVE METABOLIC PANEL, BLOOD
ALT (SGPT): 37 U/L — ABNORMAL HIGH (ref 0–33)
AST (SGOT): 27 U/L (ref 0–32)
Alkaline Phos: 96 U/L (ref 35–140)
Anion Gap: 9 mmol/L (ref 7–15)
BUN: 12 mg/dL (ref 8–23)
Bicarbonate: 29 mmol/L (ref 22–29)
Bilirubin, Tot: 0.21 mg/dL (ref <1.2)
Calcium: 9.3 mg/dL (ref 8.5–10.6)
Chloride: 100 mmol/L (ref 98–107)
Creatinine: 0.8 mg/dL (ref 0.51–0.95)
GFR: >60 mL/min
Glucose: 98 mg/dL (ref 70–99)
Potassium: 3.7 mmol/L (ref 3.5–5.1)
Sodium: 138 mmol/L (ref 136–145)

## 2020-11-22 LAB — CBC WITH DIFF, BLOOD
ANC-Automated: 5.2 10*3/uL (ref 1.6–7.0)
Abs Basophils: 0 10*3/uL (ref <0.1)
Abs Monos: 0.6 10*3/uL (ref 0.2–0.8)
Eosinophils: 3 %
Hct: 34.6 % (ref 34.0–45.0)
Hgb: 11.3 gm/dL (ref 11.2–15.7)
MCH: 27.2 pg (ref 26.0–32.0)
MCHC: 32.7 g/dL (ref 32.0–36.0)
MCV: 83.2 um3 (ref 79.0–95.0)
MPV: 9.4 fL (ref 9.4–12.4)
Monocytes: 9 %
Plt Count: 323 10*3/uL (ref 140–370)
RDW: 15.2 % — ABNORMAL HIGH (ref 12.0–14.0)
Segs: 71 %
WBC: 7.3 10*3/uL (ref 4.0–10.0)

## 2020-11-22 LAB — TROPONIN T GEN 5 W/REFLEX TO CK/CKMB: Troponin T Gen 5 w/Reflex CK/CKMB: <6 ng/L (ref <14)

## 2020-11-22 LAB — LIPASE, BLOOD: Lipase: 20 U/L (ref 13–60)

## 2020-11-22 MED ORDER — DIPHENHYDRAMINE HCL 50 MG/ML IJ SOLN
25.0000 mg | Freq: Once | INTRAMUSCULAR | Status: AC
Start: 2020-11-22 — End: 2020-11-22
  Administered 2020-11-22: 25 mg via INTRAVENOUS
  Filled 2020-11-22: qty 1

## 2020-11-22 MED ORDER — IOHEXOL 350 MG/ML IV SOLN
100.0000 mL | Freq: Once | INTRAVENOUS | Status: AC
Start: 2020-11-22 — End: 2020-11-22
  Administered 2020-11-22: 100 mL via INTRAVENOUS
  Filled 2020-11-22: qty 100

## 2020-11-22 MED ORDER — BENZTROPINE MESYLATE 1 MG/ML IJ SOLN
1.0000 mg | Freq: Once | INTRAMUSCULAR | Status: AC
Start: 2020-11-22 — End: 2020-11-22
  Administered 2020-11-22: 1 mg via INTRAMUSCULAR
  Filled 2020-11-22: qty 2

## 2020-11-22 MED ORDER — MORPHINE SULFATE 4 MG/ML IJ SOLN
4.0000 mg | Freq: Once | INTRAMUSCULAR | Status: AC
Start: 2020-11-22 — End: 2020-11-22
  Administered 2020-11-22: 4 mg via INTRAVENOUS
  Filled 2020-11-22: qty 1

## 2020-11-22 MED ORDER — DROPERIDOL 2.5 MG/ML IJ SOLN
0.6250 mg | Freq: Once | INTRAMUSCULAR | Status: AC
Start: 2020-11-22 — End: 2020-11-22
  Administered 2020-11-22: 0.625 mg via INTRAVENOUS
  Filled 2020-11-22: qty 2

## 2020-11-22 NOTE — ED Notes (Signed)
Bed: 26  Expected date:   Expected time:   Means of arrival:   Comments:  psych

## 2020-11-22 NOTE — ED Notes (Addendum)
Pt up, ambulating in hallway. Screaming "I'm having a reaction! I need IV benadryl now!" Patient shoving objects in her mouth, states she is trying to prevent biting her tongue which is swelling. Pt will not allow this RN to examine tongue, shoving fingers in her mouth trying to gag herself. No e/o hives, facial swelling, rash or other sx of allergic reaction. Security called to unit and MD notified. Patient escorted to main ED room 26 with sitter for safety. Placed on full monitor, alarms on and audible, HR 84 nsr, sats 100% RA.     Report given to RN [facility]

## 2020-11-22 NOTE — ED Notes (Signed)
Pt to ER with c/o medial abdominal pain with nausea x1 week. Pt reports pain radiates to chest at times. Pt states that this occurs frequently.

## 2020-11-22 NOTE — ED Notes (Signed)
Pt stable to CT with rad tech.

## 2020-11-22 NOTE — ED Provider Notes (Signed)
Emergency Department Note  [facility] electronic medical record reviewed for pertinent medical history.     Nursing Triage Note:   Chief Complaint   Patient presents with    Weakness     c/o chest pain "I have a knot right here" pointing to right upper chest w/associated right arm pain  +nausea x 1wk also c/o leg cramps       HPI:   65 year old female with a PMH significant for gastric bypass in 2013, presenting with 2 concerns including bump on her right chest in addition to abdominal pain.    Regarding the bump on her chest, states that it started as a pimple that she tried to pop 1 month ago, has since grown to a red painful area.      She otherwise has had 3 days of diffuse abdominal pain and distension.  States that she feels bloated.  Denies vomiting but does feel nauseous.  States that she has constipation, but still having small bowel movements and passing gas.  Denies diarrhea, dysuria, hematuria, fevers/chills.        No past medical history on file.    No past surgical history on file.    Family History:  For past Medical/Surgical/Social/Family History, refer to HPI, unless explicitly noted here.    No family history on file.    Social History [facility]):  Denies illicit drug use, denies tobacco use.    Alcohol History ([facility]):  Denies alcohol use.      Medications:   None       Allergies: Reglan [metoclopramide], Toradol [ketorolac tromethamine], Tramadol, and Zofran [ondansetron]    Review of Systems:   All other systems reviewed and negative unless otherwise noted in the HPI or above. This was done per my custom and practice for systems appropriate to the chief complaint in an emergency department setting and varies depending on the quality of history that the patient is able to provide.      Physical Exam:   11/22/20  1612   BP: 123/88   Pulse: 64   Resp: 16   Temp: 98.2 F (36.8 C)   SpO2: 100%     Nursing note and vitals reviewed.     Physical Exam  Constitutional:       General: She is not in acute  distress.  HENT:      Head: Normocephalic and atraumatic.      Mouth/Throat:      Mouth: Mucous membranes are moist.      Pharynx: Oropharynx is clear.   Eyes:      Extraocular Movements: Extraocular movements intact.      Pupils: Pupils are equal, round, and reactive to light.   Cardiovascular:      Rate and Rhythm: Normal rate and regular rhythm.      Pulses: Normal pulses.      Heart sounds: Normal heart sounds. No murmur heard.    No gallop.   Pulmonary:      Effort: Pulmonary effort is normal.      Breath sounds: Normal breath sounds.   Abdominal:      General: Bowel sounds are normal. There is distension (Mildly distended).      Palpations: Abdomen is soft.      Tenderness: There is abdominal tenderness (Diffuse). There is no guarding.      Comments: Old midline surgical scar   Musculoskeletal:         General: No swelling. Normal range of motion.  Cervical back: Normal range of motion.   Skin:     General: Skin is warm and dry.      Comments: 4 cm area of erythema and fluctuance overlying upper right anterior chest.  No significant surrounding erythema.   Neurological:      General: No focal deficit present.      Mental Status: She is alert.      Motor: No weakness.      Gait: Gait normal.         Workup Review:  ED Course as of 11/23/20 0223   Drue Flirt Kate's Documentation   Caleen Essex Nov 23, 2020   0144 Patient declined CT with contrast as she was nervous that she would have an allergic reaction contrast.  She denies any history of allergic reaction to contrast.  I did discuss with her the importance of obtaining the CT scan with contrast, however, the patient still declined.  Determined to obtain CT scan without contrast.   0132 I&D performed with no immediate complications.    7711 Patient endorsed allergic reaction to 1 of the medication she received.  Received droperidol and morphine.  Had a reaction about 45 minutes later where she states that her tongue is swollen.  No evidence of swollen tongue  on my exam.  Patient appears anxious, requesting Benadryl.  The setting of patient's symptoms will provide with Benadryl and Cogentin for treatment, consider dystonic reaction.      Others' Documentation   Fri Nov 23, 2020   0148 S/o AJ   66F gastric bypass (remote)   R chest wall abscess (abx)   Abdominal pain (ttp and distention)   CT scan pending     Plan:   -  [TC]      ED Course User Index  [TC] Sadie Haber, MD     Patient signed out pending CT scan results.      ED EKG Documentation:  ED EKG Note    No notes of this type exist for this encounter.       ED Orders:  Orders Placed This Encounter   Procedures    CMP    CBC w/ Diff [place]    ECG 12 Lead    ECG 12 Lead       Impression & ED Plan:  65 year old  female presents with abscess of her right anterior chest as well as abdominal pain.  Will perform incision and drainage for the abscess in addition to short course of antibiotics.  Otherwise regarding her abdominal pain, the setting of her surgical history and pain will obtain CT scan.  At risk for obstruction in the setting of distension in surgical history, though patient still having bowel movements.  Differential otherwise remains broad.  No indication on exam of acute surgical abdomen.  Otherwise will evaluate with above blood work in addition to CT scan.  Will treat her symptoms.  Further workup pending above blood work and imaging.      I have discussed my evaluation and care plan for the patient with the attending physician Dr. Johny Drilling.           Ricard Dillon, MD  Resident  11/23/20 [id number]       Gerome Apley, MD  11/23/20 [phone]

## 2020-11-22 NOTE — ED Notes (Signed)
Report received from Martha D RN

## 2020-11-22 NOTE — ED Notes (Signed)
MD Johnson at bedside for eval

## 2020-11-22 NOTE — ED Notes (Signed)
Report given to Wessley M RN

## 2020-11-22 NOTE — ED Notes (Signed)
[facility] ER Tech verified with MD Laural Benes and Dr Johny Drilling that patient was appropriate for ED [facility]. Patient escorted to ED south via her own electric wheelchair. Patient went straight to bathroom.

## 2020-11-23 ENCOUNTER — Other Ambulatory Visit: Payer: Self-pay

## 2020-11-23 ENCOUNTER — Emergency Department (HOSPITAL_BASED_OUTPATIENT_CLINIC_OR_DEPARTMENT_OTHER): Payer: BLUE CROSS/BLUE SHIELD

## 2020-11-23 DIAGNOSIS — Z9884 Bariatric surgery status: Secondary | ICD-10-CM

## 2020-11-23 DIAGNOSIS — R1084 Generalized abdominal pain: Secondary | ICD-10-CM

## 2020-11-23 LAB — HCV ANTIBODY WITH REFLEX QUANT: Hepatitis C Ab: NONREACTIVE

## 2020-11-23 MED ORDER — HYDROCODONE-ACETAMINOPHEN 5-325 MG OR TABS
1.0000 | ORAL_TABLET | Freq: Once | ORAL | Status: AC
Start: 2020-11-23 — End: 2020-11-23
  Administered 2020-11-23 (×2): 1 via ORAL
  Filled 2020-11-23: qty 1

## 2020-11-23 MED ORDER — IOHEXOL 350 MG/ML IV SOLN
100.0000 mL | Freq: Once | INTRAVENOUS | Status: DC
Start: 2020-11-23 — End: 2020-11-23
  Filled 2020-11-23: qty 100

## 2020-11-23 MED ORDER — CEPHALEXIN 500 MG OR CAPS
500.0000 mg | ORAL_CAPSULE | Freq: Three times a day (TID) | ORAL | 0 refills | Status: AC
Start: 2020-11-23 — End: 2020-11-30
  Filled 2020-11-23: qty 21, 7d supply, fill #0

## 2020-11-23 MED ORDER — HYDROCODONE-ACETAMINOPHEN 5-325 MG OR TABS
1.0000 | ORAL_TABLET | Freq: Four times a day (QID) | ORAL | 0 refills | Status: AC | PRN
Start: 2020-11-23 — End: 2020-11-27
  Filled 2020-11-23: qty 4, 1d supply, fill #0

## 2020-11-23 NOTE — ED MD Progress Note (Signed)
Two days of abd pain, 10 years s/p gastric bypass. Also has a superfical abscess in the chest.     Ct scan for abd pain.     Got droperidol, getting cogentin.

## 2020-11-23 NOTE — ED Procedure Note (Signed)
[facility] Procedure Note  Incision and Drainage    Date/Time: 11/23/2020 1:33 AM  Performed by: Ricard Dillon, MD  Authorized by: Leonides Grills, MD   Consent: Verbal consent obtained.  Risks and benefits: risks, benefits and alternatives were discussed  Consent given by: patient  Patient understanding: patient states understanding of the procedure being performed  Patient identity confirmed: verbally with patient and arm band  Time out: Immediately prior to procedure a "time out" was called to verify the correct patient, procedure, equipment, support staff and site/side marked as required.  Type: abscess  Body area: trunk  Location details: chest  Anesthesia: local infiltration    Anesthesia:  Local Anesthetic: lidocaine 2% without epinephrine  Anesthetic total: 5 mL    Sedation:  Patient sedated: no    Scalpel size: 11  Incision type: single straight  Complexity: simple  Drainage: purulent  Drainage amount: moderate  Wound treatment: wound left open  Packing material: 1/4 in gauze  Patient tolerance: patient tolerated the procedure well with no immediate complications        Was ultrasound utilized in this procedure? No

## 2020-11-23 NOTE — ED Notes (Signed)
Attempted to call daughter [name] regarding pt DC, no answer at this time, voicemail left.

## 2020-11-23 NOTE — ED Notes (Signed)
Bed: R  Expected date:   Expected time:   Means of arrival:   Comments:

## 2020-11-23 NOTE — ED Follow-up Note (Signed)
Follow-up type: Callback       Routine ED Patient Call Back    Pt contacted, advised to fu with pmd clinic on [address] that she goes to for wound check.  Also advised of CT findings and need for fu.

## 2020-11-23 NOTE — ED MD Progress Note (Signed)
ED Course as of 11/23/20 0240   Heide Spark Documentation   Fri Nov 23, 2020   0148 S/o AJ   77U gastric bypass (remote)   R chest wall abscess (abx)   Abdominal pain (ttp and distention)   CT scan pending     Plan:   -       Others' Documentation   Fri Nov 23, 2020   0144 Patient declined CT with contrast as she was nervous that she would have an allergic reaction contrast.  She denies any history of allergic reaction to contrast.  I did discuss with her the importance of obtaining the CT scan with contrast, however, the patient still declined.  Determined to obtain CT scan without contrast. [AJ]   0132 I&D performed with no immediate complications.  [AJ]   [id number] Patient endorsed allergic reaction to 1 of the medication she received.  Received droperidol and morphine.  Had a reaction about 45 minutes later where she states that her tongue is swollen.  No evidence of swollen tongue on my exam.  Patient appears anxious, requesting Benadryl.  The setting of patient's symptoms will provide with Benadryl and Cogentin for treatment, consider dystonic reaction. [AJ]      ED Course User Index  [AJ] Ricard Dillon, MD

## 2020-11-23 NOTE — ED Notes (Signed)
Discharge instructions given to patient. Patient states understanding. [place] wristband removed. Patient departs in electric wheelchair with dog. A&OX4. All belongings with patient.

## 2020-11-23 NOTE — Interdisciplinary (Signed)
11/23/20 0650   Assessment   Assessment Type Progress/Follow-up   Referral Information   Referral Type Discharge Planning       SW searched for pt in hallway and waiting room. Pt has been discharged although asked to speak with SW before leaving. SW unable to locate pt at this time, pt may have left without seeing SW.      Darden Amber, MSW    [facility] - Emergency Dept.   [phone]

## 2020-11-23 NOTE — Discharge Instructions (Signed)
You have been seen in the emergency department for her symptoms.    We have drain her abscess, please keep the area clean and dry and allow to continue to drain.  Change dressings as needed, at least twice daily.  Otherwise take antibiotics as prescribed.    Otherwise there were no emergent causes of your abdominal pain found on our evaluation.  If you develop any new or worsening symptoms, please return to the emergency department.  Otherwise follow-up with primary care in 2 days for symptom recheck.

## 2020-11-24 ENCOUNTER — Other Ambulatory Visit: Payer: Self-pay

## 2020-11-28 ENCOUNTER — Other Ambulatory Visit: Payer: Self-pay

## 2020-12-08 LAB — ECG 12-LEAD
ATRIAL RATE: 63 {beats}/min
ECG INTERPRETATION: NORMAL
P AXIS: 57 degrees
PR INTERVAL: 176 ms
QRS INTERVAL/DURATION: 98 ms
QT: 422 ms
QTc (Bazett): 431 ms
R AXIS: 10 degrees
T AXIS: 20 degrees
VENTRICULAR RATE: 63 {beats}/min

## 2022-10-02 IMAGING — CT CT Abdomen and Pelvis W Contrast
1 series · 1 of 2 positions shown · IV contrast (omnipaque)
Comparison: CT abdomen/pelvis May 05, 2018                                                                                      
 _______________________________________

CT ABDOMEN/PELVIS WITH CONTRAST                                                           
 _______________________________________
CLINICAL INFORMATION: Sign(s)/symptom(s): Mid abdominal pain, nausea, diarrhea.
HISTORY: Gastric bypass, hysterectomy, cholecystectomy, hernia surgery, gastric           
 cancer.                                                                                   
 Lab(s): (Normal white blood cell count).
TECHNIQUE: Helical acquisition with sagittal and coronal reformats.                                  
 Utilized dose optimization technique(s): automated exposure control,                      
 iterative                                                                                 
 reconstruction.                                                                           
 Intravenous contrast: 80 mL Omnipaque 350                                                 
 Oral contrast: No                                                                         
 LIMITATIONS: None.                                                                        

[Series 2: topogram 0.6 t20f · coronal · 2.00mm/px · 1 of 2 slices shown]
[im 2/2]
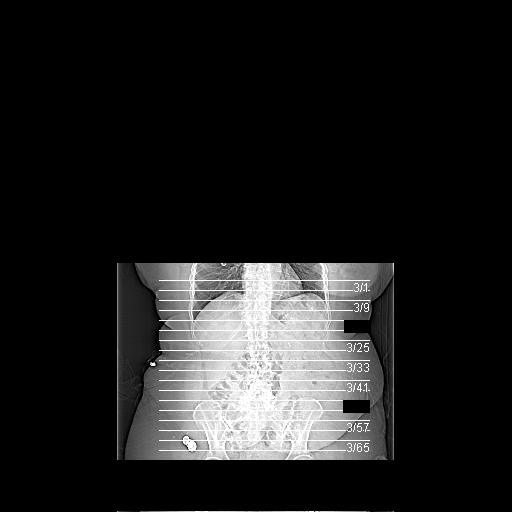

[1 of 2 positions shown; findings below may reference images not displayed]

FINDINGS: INFERIOR THORAX: No abnormality.                                                          
 LIVER: No abnormality.                                                                    
 BILIARY: Common bile duct diameter 1.1 cm (previously 1.1 cm).                            
 PANCREAS: No abnormality.                                                                 
 SPLEEN: No abnormality.                                                                   
 ADRENAL: Left adrenal hyperplasia.                                                        
 UROGENITAL: Right cortical simple renal cyst (1.5 cm diameter). No                        
 nephrolithiasis. No hydronephrosis. No bladder abnormality. Left adnexal cyst             
 (4.5 cm x 2.6 cm, circumscribed, noncalcified, oval, average attenuation 6                
 Hounsfield units; previously 1.9 cm x 1.6 cm). No right adnexal                           
 abnormality.                                                                              
 GASTROINTESTINAL: Postsurgical changes upper GI tract from gastric                        
 bypass.                                                                                   
 Hyperenhancement gastric antrum mucosa. Surgical staple line small bowel left             
 upper quadrant abdomen. No bowel dilatation or bowel wall thickening. No                  
 colonic                                                                                   
 abnormality.                                                                              
 PERITONEAL CAVITY: No ascites or extraluminal air.                                        
 NODES: No abnormality.                                                                    
 VASCULATURE: Atherosclerotic disease without aneurysm.                                    
 ABDOMINAL WALL: Postoperative changes anterior abdominal wall without hernia.             
 BONES: Multilevel spondylosis lumbar spine. Left intramedullary rod and nail              
 within femur traverses healed fracture.                                                   
 ADDITIONAL: No significant additional incidental                                          
 finding.                                                                                  
 _______________________________________
IMPRESSION: 1.  No acute finding.                                                                     
 2.  Expected postoperative changes from gastric bypass with possible chronic              
 antral gastritis.                                                                         
 3.  No bowel obstruction.                                                                 
 4.  Left adnexal cyst (4.5 cm, previously 1.9 cm; differential diagnosis                  
 includes physiologic cyst, malignancy, outpatient follow-up pelvic ultrasound             
 within one month recommended).                                                            
 5.  No lymphadenopathy.
